# Patient Record
Sex: Female | Born: 1950
Health system: Southern US, Community
[De-identification: ages and names within clinical notes are randomized; demographics above are authoritative.]

## PROBLEM LIST (undated history)

## (undated) DIAGNOSIS — I499 Cardiac arrhythmia, unspecified: Secondary | ICD-10-CM

## (undated) DIAGNOSIS — N951 Menopausal and female climacteric states: Secondary | ICD-10-CM

## (undated) DIAGNOSIS — H3530 Unspecified macular degeneration: Secondary | ICD-10-CM

## (undated) DIAGNOSIS — I10 Essential (primary) hypertension: Secondary | ICD-10-CM

## (undated) DIAGNOSIS — N83201 Unspecified ovarian cyst, right side: Secondary | ICD-10-CM

## (undated) DIAGNOSIS — I639 Cerebral infarction, unspecified: Secondary | ICD-10-CM

## (undated) DIAGNOSIS — I4891 Unspecified atrial fibrillation: Secondary | ICD-10-CM

## (undated) DIAGNOSIS — N3281 Overactive bladder: Secondary | ICD-10-CM

## (undated) DIAGNOSIS — Z79899 Other long term (current) drug therapy: Secondary | ICD-10-CM

## (undated) DIAGNOSIS — E039 Hypothyroidism, unspecified: Secondary | ICD-10-CM

## (undated) DIAGNOSIS — G4730 Sleep apnea, unspecified: Secondary | ICD-10-CM

## (undated) HISTORY — DX: Unspecified atrial fibrillation: I48.91

## (undated) HISTORY — DX: Menopausal and female climacteric states: N95.1

## (undated) HISTORY — DX: Essential (primary) hypertension: I10

## (undated) HISTORY — PX: ABDOMINAL HYSTERECTOMY: SHX81

## (undated) HISTORY — PX: APPENDECTOMY: SHX54

## (undated) HISTORY — PX: CATARACT EXTRACTION W/ INTRAOCULAR LENS  IMPLANT, BILATERAL: SHX1307

## (undated) HISTORY — DX: Other long term (current) drug therapy: Z79.899

## (undated) HISTORY — PX: TONSILECTOMY, ADENOIDECTOMY, BILATERAL MYRINGOTOMY AND TUBES: SHX2538

## (undated) HISTORY — DX: Unspecified macular degeneration: H35.30

## (undated) HISTORY — DX: Unspecified ovarian cyst, right side: N83.201

---

## 2001-04-03 ENCOUNTER — Other Ambulatory Visit: Admission: RE | Admit: 2001-04-03 | Discharge: 2001-04-03 | Payer: Self-pay | Admitting: Obstetrics and Gynecology

## 2001-05-17 ENCOUNTER — Ambulatory Visit (HOSPITAL_COMMUNITY): Admission: RE | Admit: 2001-05-17 | Discharge: 2001-05-17 | Payer: Self-pay | Admitting: Obstetrics and Gynecology

## 2001-05-17 ENCOUNTER — Encounter: Payer: Self-pay | Admitting: Obstetrics and Gynecology

## 2003-01-23 ENCOUNTER — Ambulatory Visit (HOSPITAL_COMMUNITY): Admission: RE | Admit: 2003-01-23 | Discharge: 2003-01-23 | Payer: Self-pay | Admitting: Family Medicine

## 2003-01-23 ENCOUNTER — Encounter: Payer: Self-pay | Admitting: Family Medicine

## 2003-12-04 ENCOUNTER — Ambulatory Visit (HOSPITAL_COMMUNITY): Admission: RE | Admit: 2003-12-04 | Discharge: 2003-12-04 | Payer: Self-pay | Admitting: Internal Medicine

## 2003-12-04 HISTORY — PX: COLONOSCOPY: SHX174

## 2004-01-29 ENCOUNTER — Ambulatory Visit (HOSPITAL_COMMUNITY): Admission: RE | Admit: 2004-01-29 | Discharge: 2004-01-29 | Payer: Self-pay | Admitting: Family Medicine

## 2005-04-06 ENCOUNTER — Ambulatory Visit (HOSPITAL_COMMUNITY): Admission: RE | Admit: 2005-04-06 | Discharge: 2005-04-06 | Payer: Self-pay | Admitting: Family Medicine

## 2006-04-11 ENCOUNTER — Ambulatory Visit (HOSPITAL_COMMUNITY): Admission: RE | Admit: 2006-04-11 | Discharge: 2006-04-11 | Payer: Self-pay | Admitting: Family Medicine

## 2006-04-21 ENCOUNTER — Encounter: Admission: RE | Admit: 2006-04-21 | Discharge: 2006-04-21 | Payer: Self-pay | Admitting: Family Medicine

## 2006-04-21 IMAGING — MG MM DIAGNOSTIC LTD LEFT
2 series · 2 of 2 positions shown · non-contrast
Comparison: none

[REDACTED] LEFT
CC and MLO view(s) were taken of the left breast.

DIGITAL LIMITED LEFT DIAGNOSTIC MAMMOGRAM:
CLINICAL DATA: Abnormal screening mammogram.
Comparison study is dated [DATE].
Additional views reveal no persistent mass or distortion within the inner left breast.    The dense
fibroglandular parenchymal pattern is stable.

[L CC]
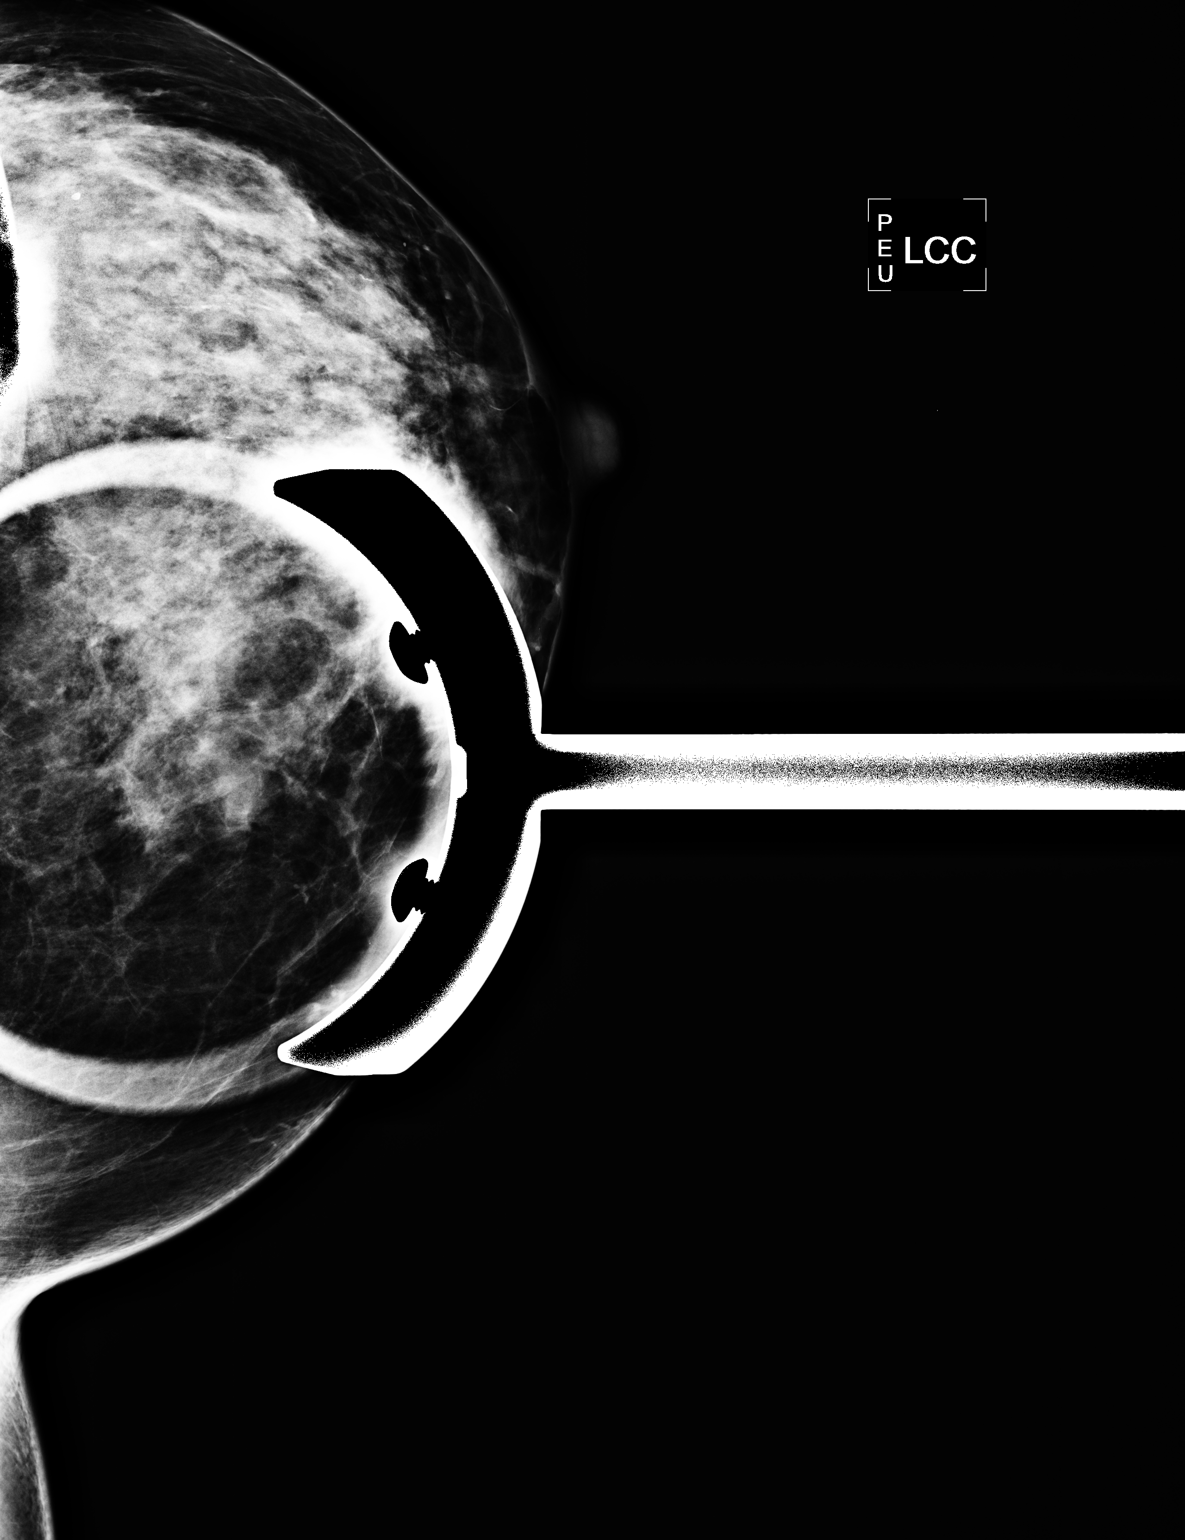

[L MLO]
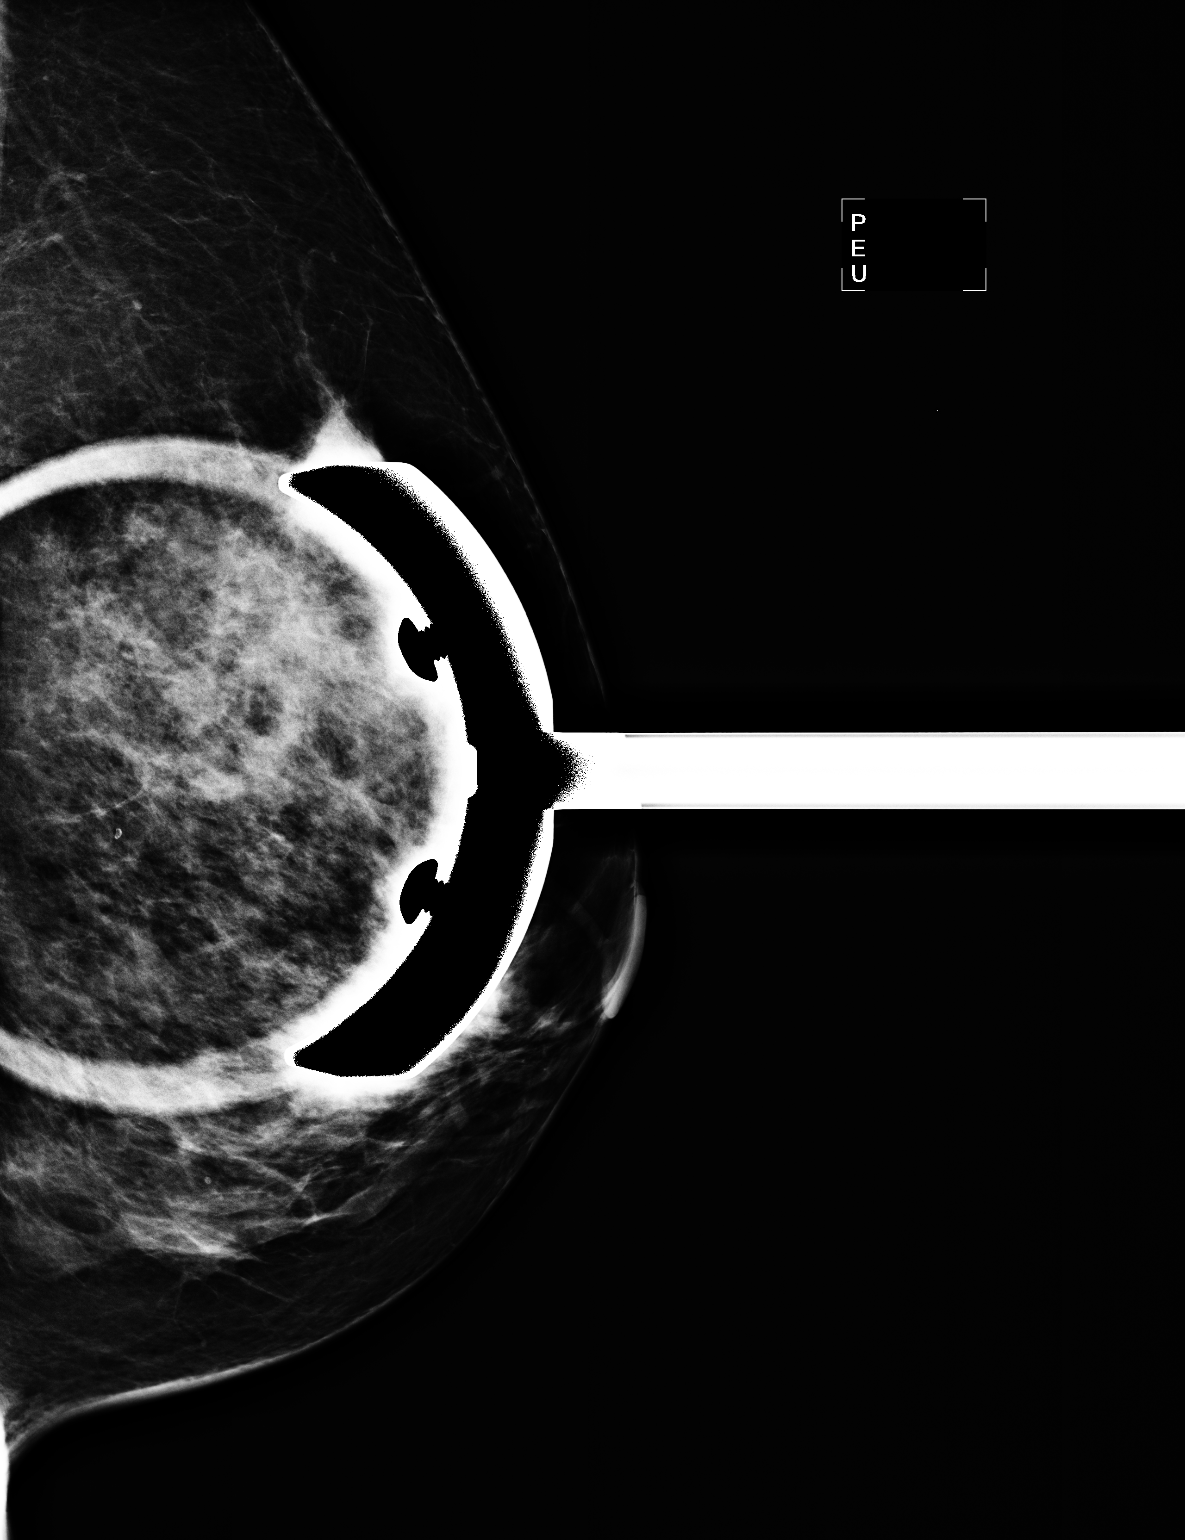

[2 of 2 positions shown; findings below may reference images not displayed]

IMPRESSION: Stable mammogram with no specific radiographic evidence of malignancy on the left.  Screening 
mammogram in one year is recommended.

ASSESSMENT: Benign - BI-RADS 2

Screening mammogram of both breasts in 1 year.
, THIS PROCEDURE WAS A DIGITAL MAMMOGRAM.

## 2007-04-24 ENCOUNTER — Ambulatory Visit (HOSPITAL_COMMUNITY): Admission: RE | Admit: 2007-04-24 | Discharge: 2007-04-24 | Payer: Self-pay | Admitting: Obstetrics and Gynecology

## 2007-07-27 ENCOUNTER — Ambulatory Visit: Payer: Self-pay | Admitting: Cardiovascular Disease

## 2007-08-08 ENCOUNTER — Encounter: Payer: Self-pay | Admitting: Cardiovascular Disease

## 2007-08-08 ENCOUNTER — Ambulatory Visit (HOSPITAL_COMMUNITY): Admission: RE | Admit: 2007-08-08 | Discharge: 2007-08-08 | Payer: Self-pay | Admitting: Cardiovascular Disease

## 2007-08-08 ENCOUNTER — Ambulatory Visit: Payer: Self-pay | Admitting: Cardiovascular Disease

## 2007-08-08 ENCOUNTER — Ambulatory Visit: Payer: Self-pay | Admitting: Cardiology

## 2008-05-14 ENCOUNTER — Ambulatory Visit (HOSPITAL_COMMUNITY): Admission: RE | Admit: 2008-05-14 | Discharge: 2008-05-14 | Payer: Self-pay | Admitting: Family Medicine

## 2008-05-14 IMAGING — MG MM DIGITAL SCREENING BILAT W/ CAD
5 series · 5 of 5 positions shown · non-contrast
Comparison: Prior studies.

DG SCREEN MAMMOGRAM BILATERAL
Bilateral CC and MLO view(s) were taken.
Prior study comparison: [DATE], bilateral screening mammogram.

DIGITAL SCREENING MAMMOGRAM WITH CAD:

[L CC]
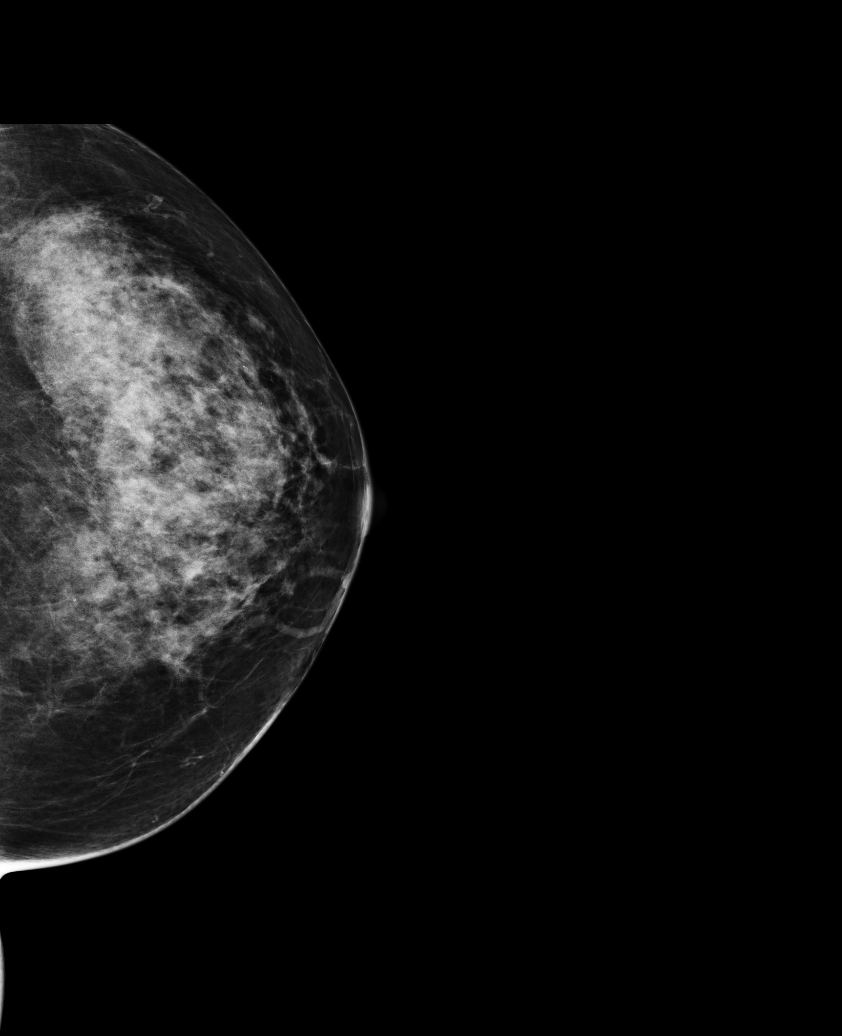

[L MLO (1 of 2)]
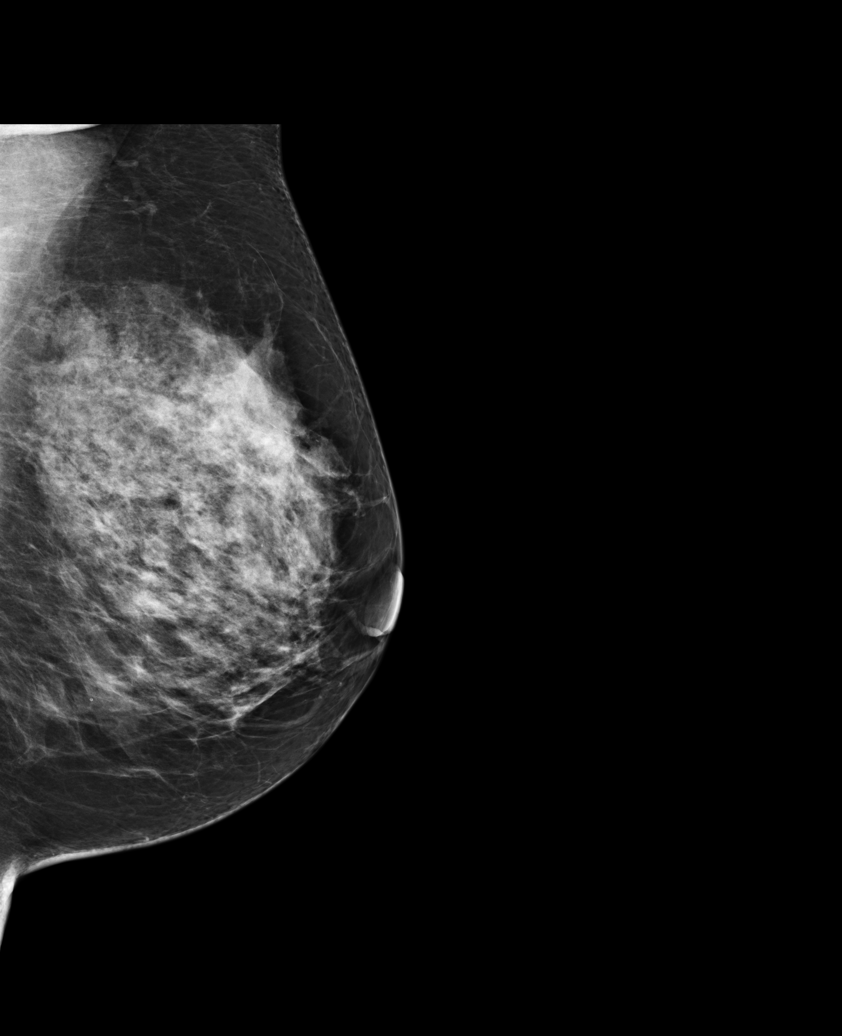

[R CC]
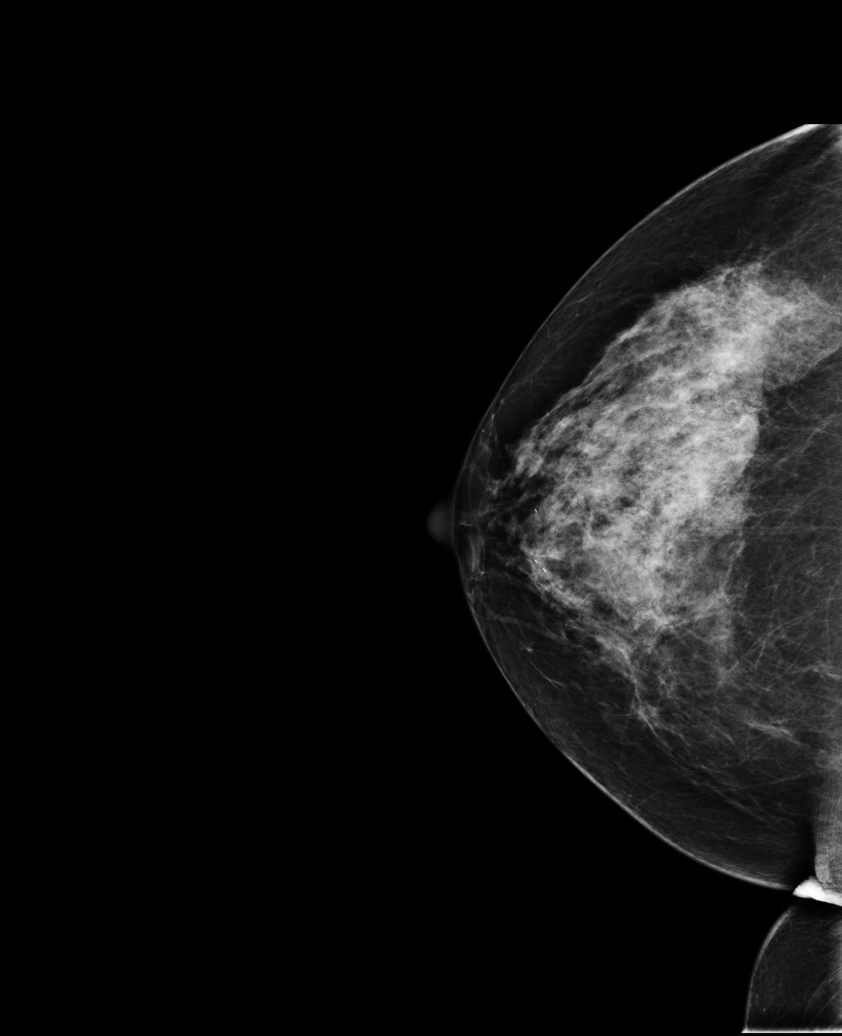

[R MLO]
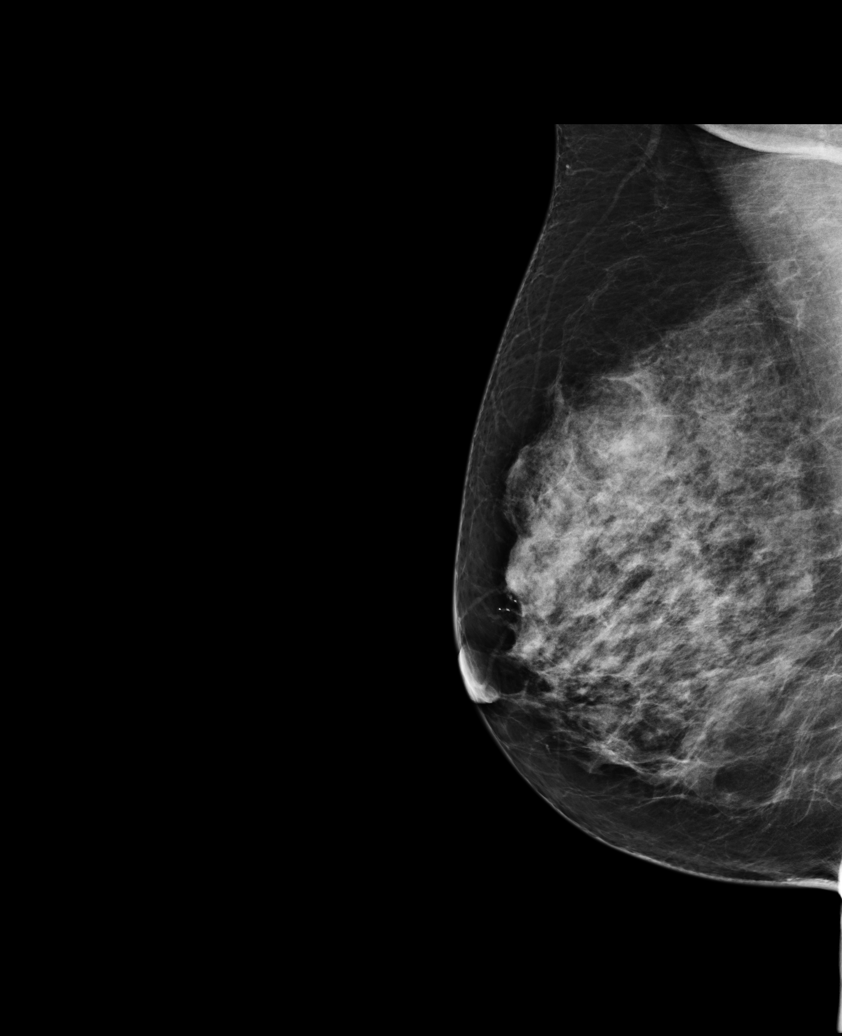

[L MLO (2 of 2)]
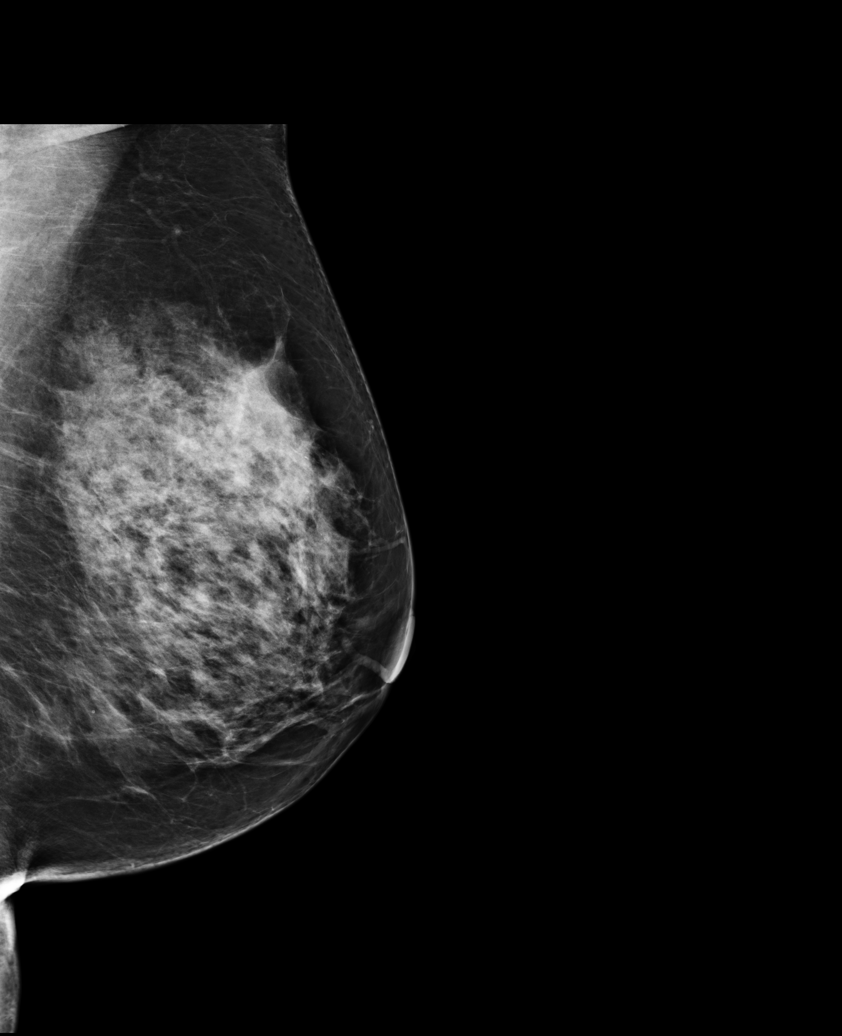

[5 of 5 positions shown; findings below may reference images not displayed]

The breast tissue is heterogeneously dense.  There is no dominant mass, architectural distortion or
calcification to suggest malignancy.
IMPRESSION: No mammographic evidence of malignancy.  Suggest yearly screening mammography.

ASSESSMENT: Negative - BI-RADS 1

Screening mammogram in 1 year.
THIS WAS ANALAYZED BY COMPUTER AIDED DETECTION. , THIS PROCEDURE WAS A DIGITAL MAMMOGRAM.

## 2008-09-23 DIAGNOSIS — I480 Paroxysmal atrial fibrillation: Secondary | ICD-10-CM

## 2008-09-23 DIAGNOSIS — I10 Essential (primary) hypertension: Secondary | ICD-10-CM

## 2009-06-09 ENCOUNTER — Ambulatory Visit (HOSPITAL_COMMUNITY): Admission: RE | Admit: 2009-06-09 | Discharge: 2009-06-09 | Payer: Self-pay | Admitting: Family Medicine

## 2009-06-09 IMAGING — MG MM DIGITAL SCREENING
5 series · 5 of 5 positions shown · non-contrast
Comparison: none

DG SCREEN MAMMOGRAM BILATERAL
Bilateral CC and MLO view(s) were taken.
Technologist: [REDACTED]

DIGITAL SCREENING MAMMOGRAM WITH CAD:
The breast tissue is extremely dense.  No masses or malignant type calcifications are identified.  
Compared with prior studies.
Images were processed with CAD.

[L CC (1 of 2)]
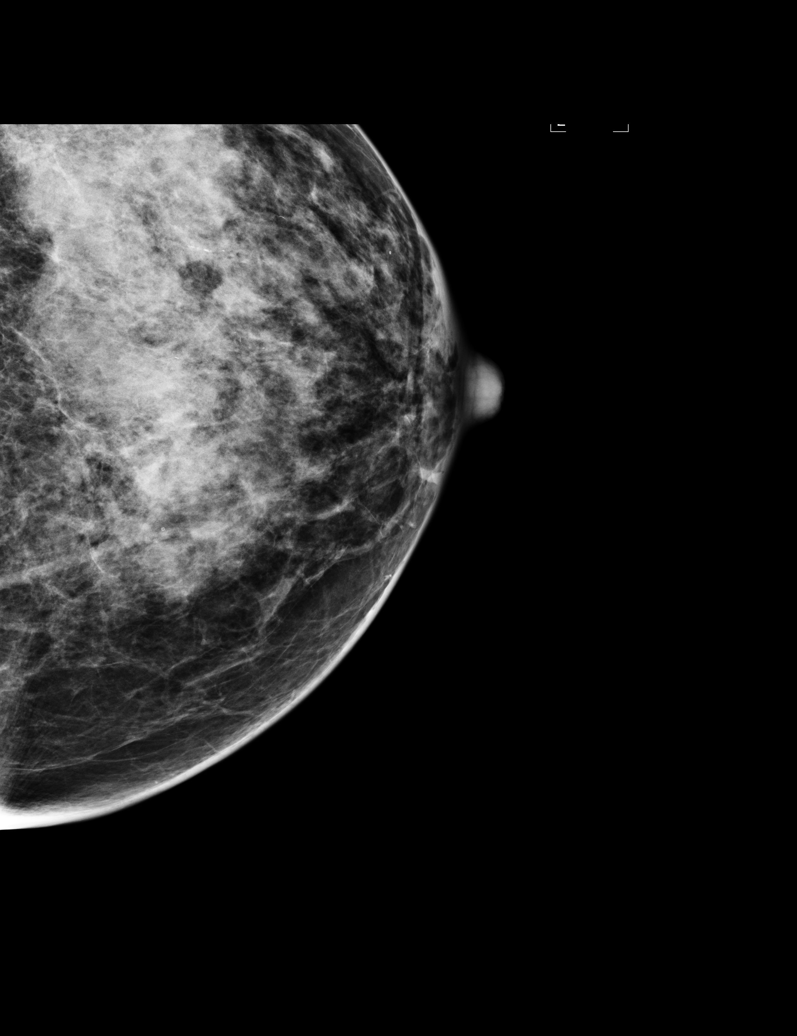

[L MLO]
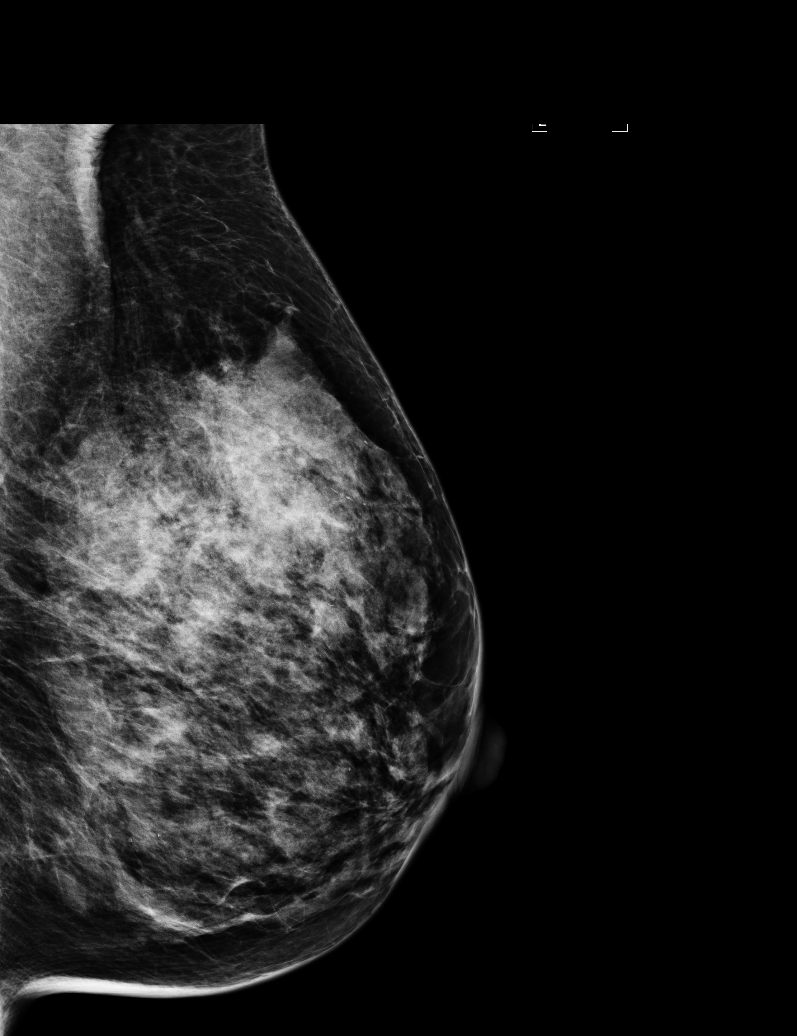

[R CC]
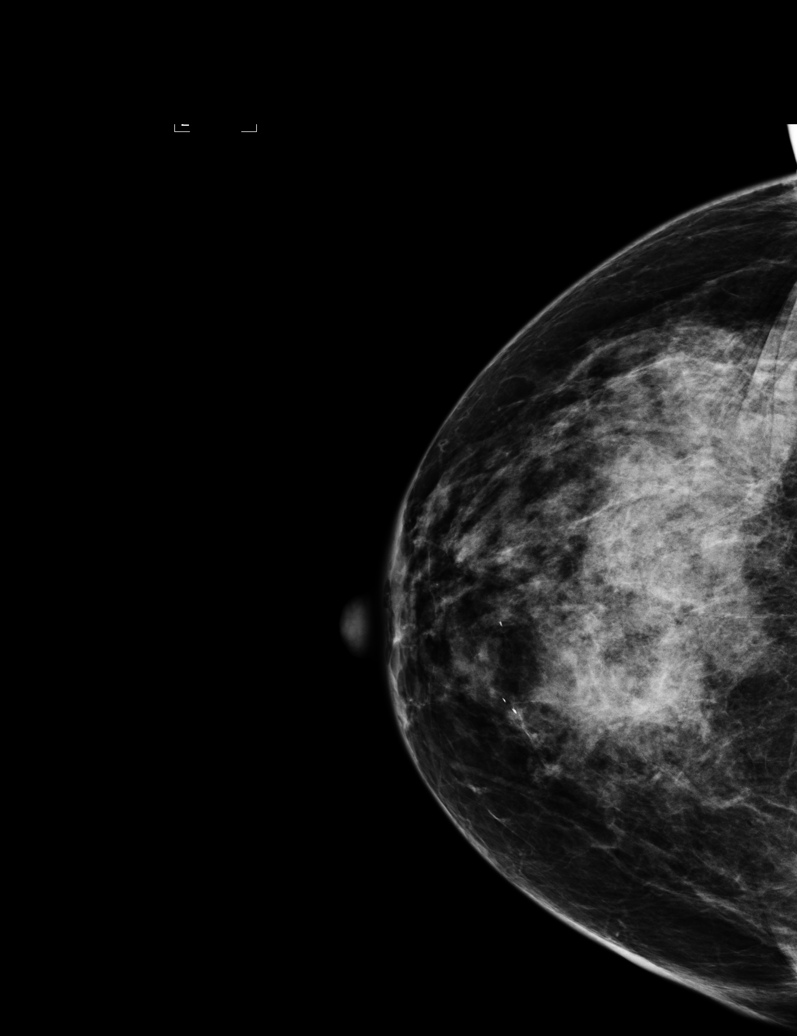

[R MLO]
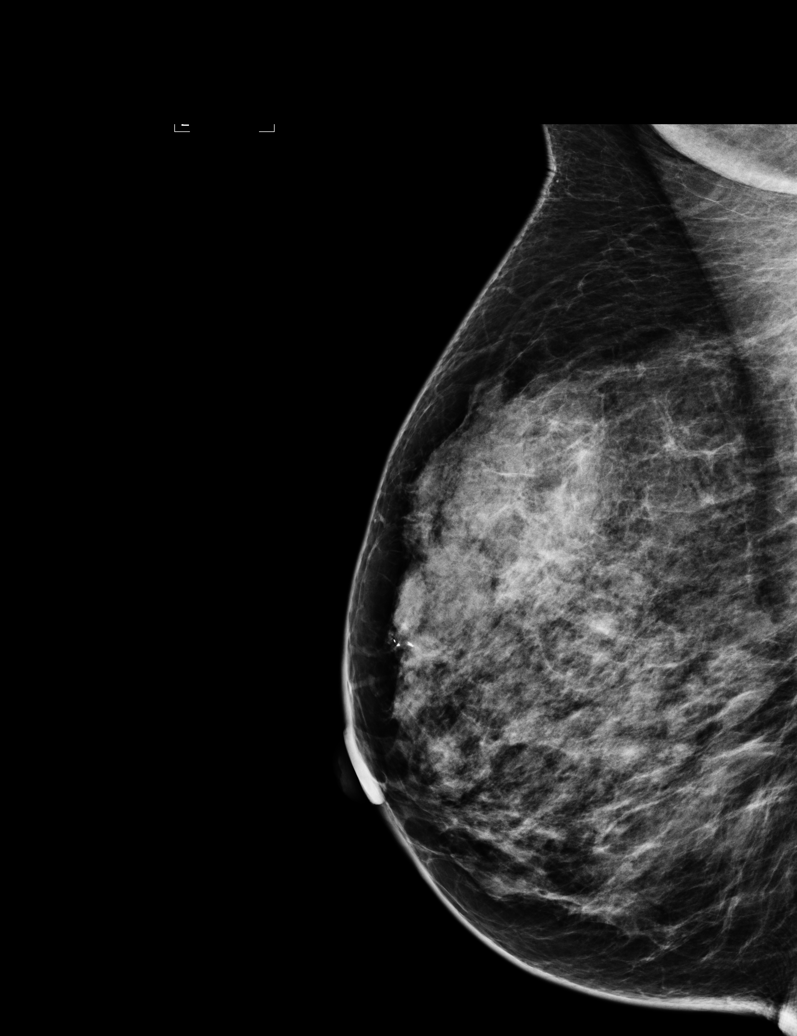

[L CC (2 of 2)]
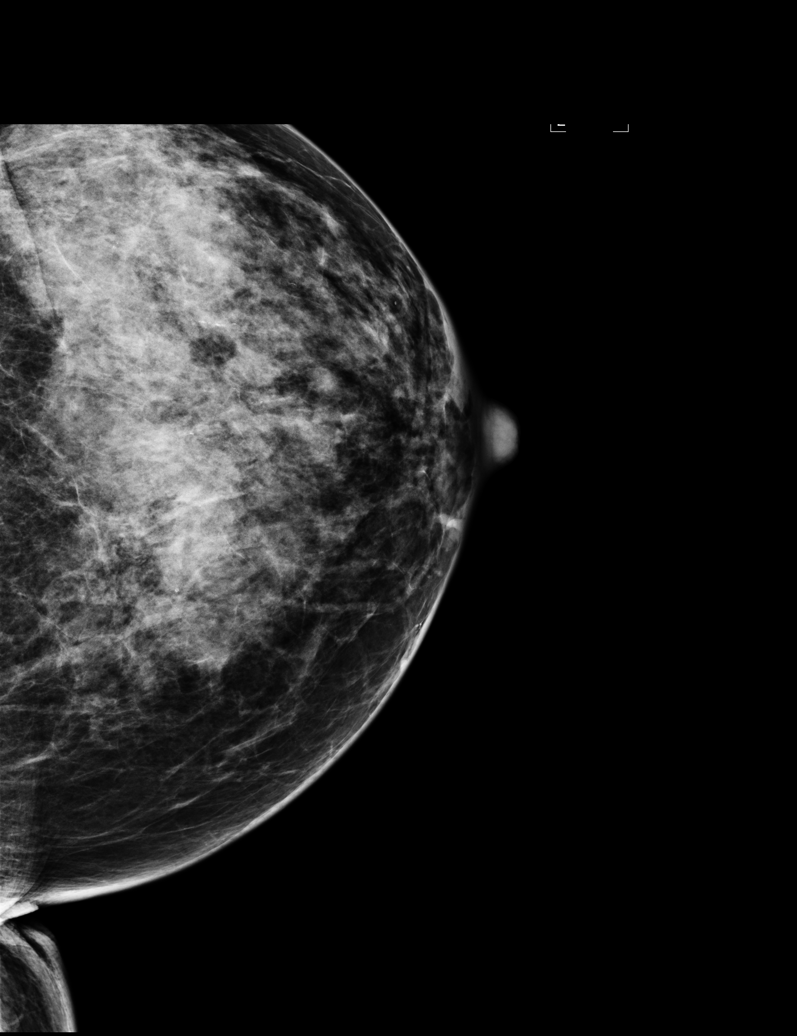

[5 of 5 positions shown; findings below may reference images not displayed]

IMPRESSION: No specific mammographic evidence of malignancy.  Next screening mammogram is recommended in one 
year.

A result letter of this screening mammogram will be mailed directly to the patient.

ASSESSMENT: Negative - BI-RADS 1

Screening mammogram in 1 year.
,

## 2010-05-02 ENCOUNTER — Encounter: Payer: Self-pay | Admitting: Family Medicine

## 2010-05-03 ENCOUNTER — Encounter: Payer: Self-pay | Admitting: Family Medicine

## 2010-06-29 ENCOUNTER — Other Ambulatory Visit (HOSPITAL_COMMUNITY): Payer: Self-pay | Admitting: Family Medicine

## 2010-06-29 DIAGNOSIS — Z139 Encounter for screening, unspecified: Secondary | ICD-10-CM

## 2010-07-06 ENCOUNTER — Emergency Department (HOSPITAL_COMMUNITY): Payer: 59

## 2010-07-06 ENCOUNTER — Emergency Department (HOSPITAL_COMMUNITY)
Admission: EM | Admit: 2010-07-06 | Discharge: 2010-07-06 | Disposition: A | Discharge status: Home / Self Care | Payer: 59 | Attending: Emergency Medicine | Admitting: Emergency Medicine

## 2010-07-06 DIAGNOSIS — I4891 Unspecified atrial fibrillation: Secondary | ICD-10-CM | POA: Insufficient documentation

## 2010-07-06 DIAGNOSIS — R9431 Abnormal electrocardiogram [ECG] [EKG]: Secondary | ICD-10-CM | POA: Insufficient documentation

## 2010-07-06 DIAGNOSIS — R000 Tachycardia, unspecified: Secondary | ICD-10-CM | POA: Insufficient documentation

## 2010-07-06 DIAGNOSIS — D72829 Elevated white blood cell count, unspecified: Secondary | ICD-10-CM | POA: Insufficient documentation

## 2010-07-06 LAB — POCT CARDIAC MARKERS
CKMB, poc: <1 ng/mL — ABNORMAL LOW (ref 1.0–8.0)
Troponin i, poc: <0.05 ng/mL (ref 0.00–0.09)

## 2010-07-06 LAB — CBC
HCT: 43.8 % (ref 36.0–46.0)
Hemoglobin: 15.2 g/dL — ABNORMAL HIGH (ref 12.0–15.0)
MCH: 30.6 pg (ref 26.0–34.0)
MCHC: 34.7 g/dL (ref 30.0–36.0)
MCV: 88.3 fL (ref 78.0–100.0)
Platelets: 268 K/uL (ref 150–400)
RBC: 4.96 MIL/uL (ref 3.87–5.11)
RDW: 13 % (ref 11.5–15.5)
WBC: 13 K/uL — ABNORMAL HIGH (ref 4.0–10.5)

## 2010-07-06 LAB — BASIC METABOLIC PANEL
BUN: 9 mg/dL (ref 6–23)
CO2: 25 mEq/L (ref 19–32)
Chloride: 103 mEq/L (ref 96–112)
Creatinine, Ser: 0.64 mg/dL (ref 0.4–1.2)
GFR calc Af Amer: >60 mL/min (ref >60)

## 2010-07-06 IMAGING — CR DG CHEST 1V PORT
1 series · 1 of 1 positions shown · non-contrast
Comparison: None.

CLINICAL DATA: Arrhythmia/cough

PORTABLE CHEST - 1 VIEW

[view not recorded]
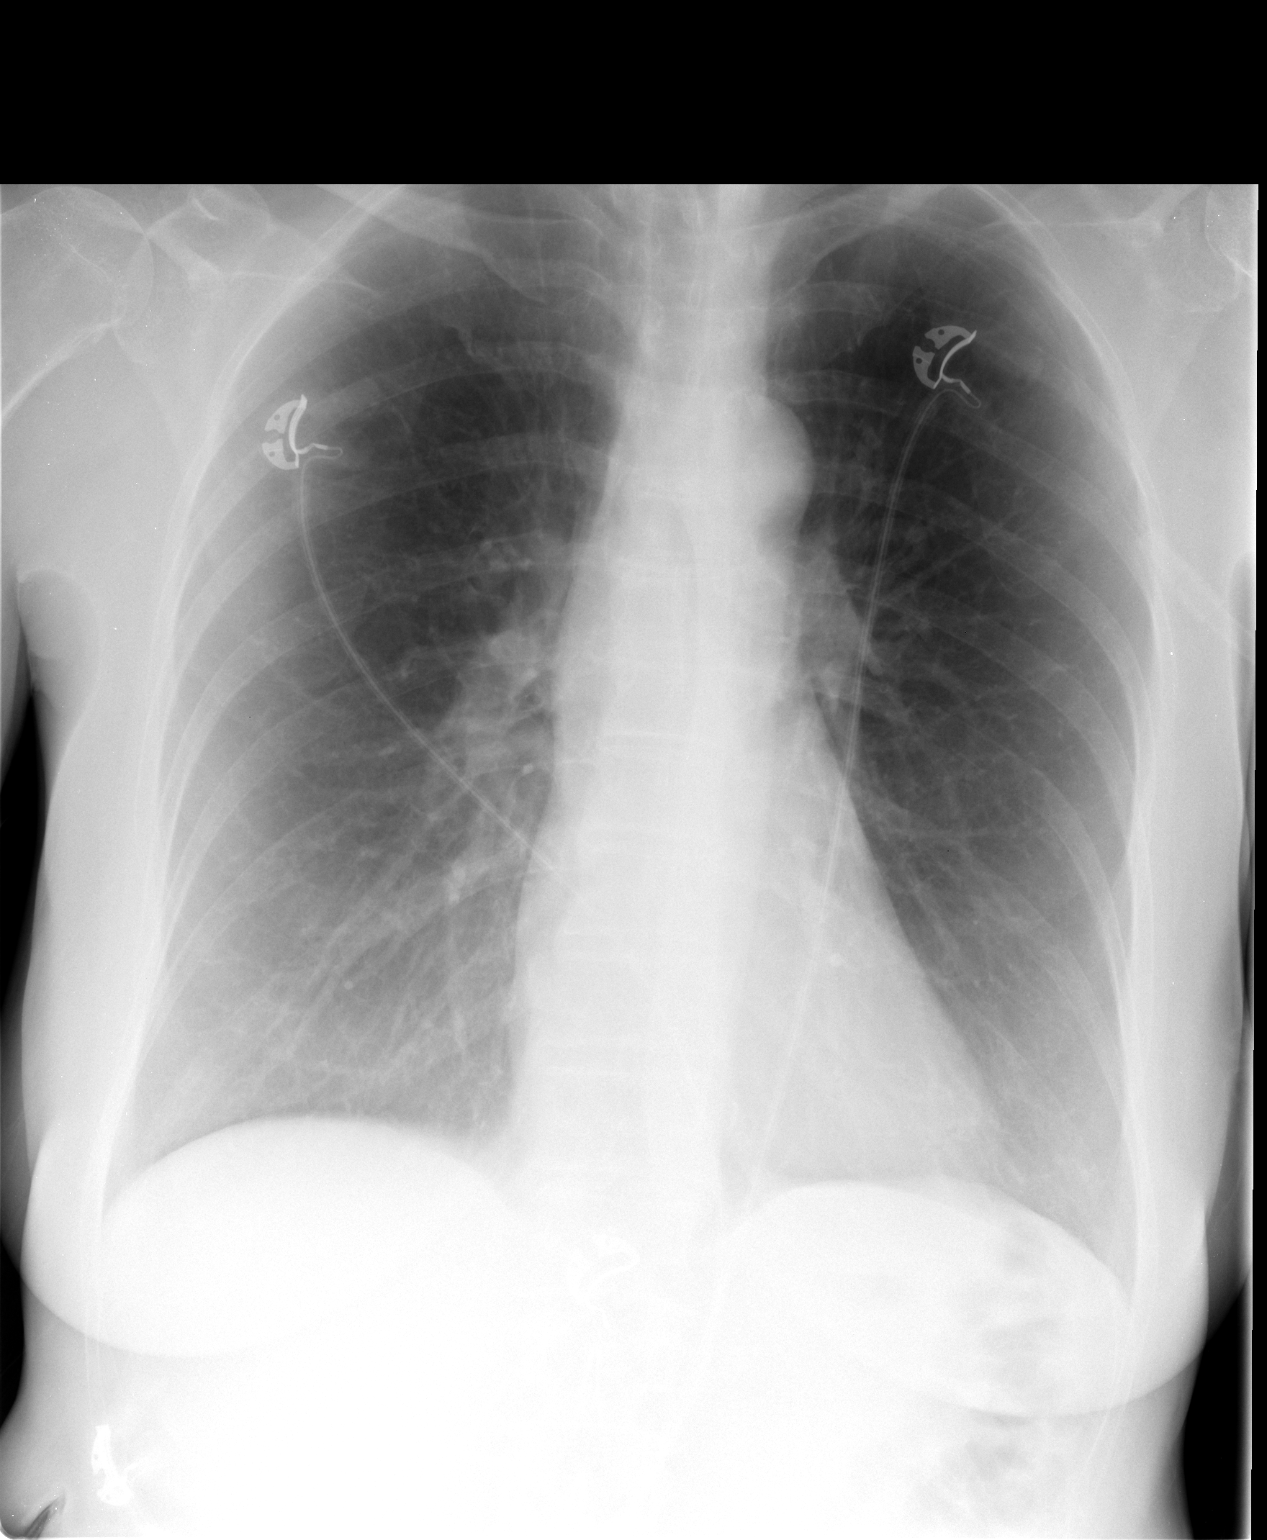

[1 of 1 positions shown; findings below may reference images not displayed]

FINDINGS: Heart and mediastinal contours normal.  Lungs clear
although  hyperaerated, suggesting COPD.  No lung consolidation or
pleural fluid in one-view.
IMPRESSION: Probable COPD. No active disease in one-view.

## 2010-07-08 ENCOUNTER — Ambulatory Visit (HOSPITAL_COMMUNITY)
Admit: 2010-07-08 | Discharge: 2010-07-08 | Disposition: A | Discharge status: Home / Self Care | Payer: 59 | Source: Ambulatory Visit | Attending: Adult Health | Admitting: Adult Health

## 2010-07-08 ENCOUNTER — Other Ambulatory Visit: Payer: Self-pay | Admitting: Cardiology

## 2010-07-08 DIAGNOSIS — I4891 Unspecified atrial fibrillation: Secondary | ICD-10-CM

## 2010-07-08 DIAGNOSIS — I10 Essential (primary) hypertension: Secondary | ICD-10-CM | POA: Insufficient documentation

## 2010-07-13 ENCOUNTER — Ambulatory Visit (HOSPITAL_COMMUNITY)
Admission: RE | Admit: 2010-07-13 | Discharge: 2010-07-13 | Disposition: A | Discharge status: Home / Self Care | Payer: 59 | Source: Ambulatory Visit | Attending: Family Medicine | Admitting: Family Medicine

## 2010-07-13 DIAGNOSIS — Z1231 Encounter for screening mammogram for malignant neoplasm of breast: Secondary | ICD-10-CM | POA: Insufficient documentation

## 2010-07-13 DIAGNOSIS — Z139 Encounter for screening, unspecified: Secondary | ICD-10-CM

## 2010-07-14 NOTE — Consult Note (Signed)
Renee Benitez, Renee Benitez               ACCOUNT NO.:  [id number]  MEDICAL RECORD NO.:  [id number]           PATIENT TYPE:  E  LOCATION:  [facility]                          FACILITY:  [facility]  PHYSICIAN:  [name], MD DATE OF BIRTH:  27-Jul-1950  DATE OF CONSULTATION:  07/06/2010 DATE OF DISCHARGE:  07/06/2010                                CONSULTATION   REASON FOR CONSULTATION:  AFib, RVR.  HISTORY OF PRESENT ILLNESS:  This is a 60 year old Caucasian female with known history of PSVT and had been seen by Dr. Charlton Haws in 2009, with an echocardiogram and a stress test at that time which were found to be normal.  Four nights ago, the patient had an episode of rapid heartbeat lasting 1 hour after having a coughing episode.  She took 4 baby aspirin, laid down, and it went away.  The following evening, again she had another coughing episode, she took 4 baby aspirin and it went away, each episode lasting approximately 1 hour.  The patient did not have any further episodes over the last 2 days and then has had an episode this morning while she was at work.  She had rapid heart rate, she had a coworker check her.  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 EKG was completed there and showed rapid heart rate of 160 beats per minute.  She was sent over to see Dr. Sudie Bailey, her primary care physician at which time, she was also trying to see him for sinusitis. Her heart rate remained elevated and she was sent directly to [facility].  In the ER, the patient was given 15 mg IV bolus of Cardizem and started on a Cardizem drip at 10 mg an hour.  She subsequently converted to normal sinus rhythm with a rate in the 70s and continues on an IV drip at this time at 10 mg.  We were asked to see her at the request of ER physician for __________ and need for discharge or admission to [facility].  She is normally followed by Dr. Sudie Bailey.  Of note, the patient has lost 30 pounds over 1  year.  She had been on metoprolol 50 mg daily per Dr. Eden Emms for frequent palpitations, however, she decreased it to 25 mg daily as she states that her blood pressure was too low after losing weight.  She is being followed by Dr. Sudie Bailey and he was aware of her medication dose change.  He has also been following her for her thyroid.  She states that they have been mildly elevated in his office, but she states that he had not put her on any medications as it was not significantly elevated and he was going to continue to follow.  Also noted that secondary to her frequent sinusitis and sinus congestion, she had been on some pseudoephedrine 30 mg which she was taking p.r.n., although not excessively.  REVIEW OF SYSTEMS:  Positive for sinus pressure and allergies, rapid heart rate, some mild pressure with the rapid heart rate.  She denies chest pain, shortness of breath, dizziness, nausea or vomiting.  Other systems are reviewed and are found to be negative.  Code status is a full code.  PAST MEDICAL HISTORY: 1. PSVT in 2009. 2. Hypertension. 3. She also had a stress test and echocardiogram completed at that     time both of which were negative.  SOCIAL HISTORY:  She lives in [place] with her husband.  She is a Engineer, civil (consulting) at [employer].  She is married.  She does not smoke.  She has rare alcohol, occasional glass of wine, and negative for drugs.  FAMILY HISTORY:  Mother deceased when the patient was a child from suicide.  Her father had CAD and deceased from an MI.  She has one sister who has had rapid heart rates in the past and has had an AV ablation.  CURRENT MEDICATIONS PRIOR TO ADMISSION: 1. Toprol-XL 25 mg daily at bedtime. 2. Hormone patch. 3. Aspirin 81 mg daily. 4. Multivitamin. 5. Recent over the counter use of pseudoephedrine.  ALLERGIES:  No known drug allergies.  CURRENT LABS:  Sodium 138, potassium 5.0, chloride 103, CO2 of 25, BUN 9, creatinine 0.64,  glucose 107.  Hemoglobin 15.2, hematocrit 43.8, white blood cells 13.0, platelets 268.  Point-of-care is negative for 0.05.  Chest x-ray revealing COPD with no active disease.  EKG revealing normal sinus rhythm with a rate of 69 beats per minute.  Of note, initial EKG on admission revealed an atrial fibrillation with RVR, rate of 159 beats per minute.  PHYSICAL EXAMINATION:  VITAL SIGNS:  Blood pressure 122/93, pulse 98, respirations 20, temperature 98.3, O2 sat 100% on 2 L. GENERAL:  She is awake, alert, oriented, no acute distress. HEENT:  Head is normocephalic and atraumatic.  Eyes, PERRLA. NECK:  Supple.  No JVD.  No thyromegaly.  No carotid bruits are appreciated. CARDIOVASCULAR:  Regular rate and rhythm without murmurs, rubs, or gallops.  Pulses are 2+ and equal without bruits.  LUNGS:  Clear to auscultation without wheezes, rales, or rhonchi. ABDOMEN:  Soft, nontender with 2+ bowel sounds. EXTREMITIES:  Without clubbing, cyanosis, or edema. MUSCULOSKELETAL:  No joint deformity or effusions. NEUROLOGIC:  Cranial nerves II through XII are grossly intact.  IMPRESSION:  Paroxysmal atrial fibrillation, now converted to normal sinus rhythm on Cardizem drip at 10 mg an hour after a bolus.  She has been having symptoms on and off since Thursday approximately 4 days ago, but has had a history of palpitations for years which have been quiescent for 2 years on use of metoprolol.  The patient has no associated chest pain, dyspnea.  She is currently stable.  The patient has been seen and examined by myself and Dr. Nona Dell.  There is a question whether recent upper respiratory infection with cough and use of pseudoephedrine elicited this.  The patient is currently stable on the Cardizem drip at 10 mg an hour.  Our plan will be to place her on Cardizem CD 120 mg daily.  She will discontinue her IV drip and allow her walk around.  If she has recurrent atrial fibrillation with RVR,  she will need to be admitted, otherwise she will return home at which time she will have a followup appointment to have an echocardiogram on July 08, 2010, at 9:30 a.m. and then see Dr. Diona Browner on August 05, 2010, at 3 p.m. in our office.  This has been discussed with the patient and her husband at bedside and they are agreeable to follow this recommendations.  The patient will also have a  thyroid panel completed as it had been mildly abnormal in Dr. Michelle Nasuti office for reevaluation of this.  The patient will be continued on aspirin 81 mg daily.  For now, her CHADS score is 1 for hypertension.     Bettey Mare. Lyman Bishop, NP   ______________________________ [name], MD    KML/MEDQ  D:  07/06/2010  T:  07/07/2010  Job:  [id number]  cc:   Mila Homer. Sudie Bailey, M.D. Fax: [phone]  Electronically Signed by Joni Reining NP on 07/13/2010 08:01:10 AM Electronically Signed by Nona Dell MD on 07/14/2010 05:15:29 PM

## 2010-08-04 ENCOUNTER — Encounter: Payer: Self-pay | Admitting: Cardiology

## 2010-08-05 ENCOUNTER — Ambulatory Visit (INDEPENDENT_AMBULATORY_CARE_PROVIDER_SITE_OTHER): Payer: 59 | Admitting: Cardiology

## 2010-08-05 ENCOUNTER — Encounter: Payer: Self-pay | Admitting: Cardiology

## 2010-08-05 VITALS — BP 122/62 | HR 76 | Ht 66.0 in | Wt 141.0 lb

## 2010-08-05 DIAGNOSIS — I4891 Unspecified atrial fibrillation: Secondary | ICD-10-CM

## 2010-08-05 DIAGNOSIS — I10 Essential (primary) hypertension: Secondary | ICD-10-CM

## 2010-08-05 MED ORDER — DILTIAZEM HCL 30 MG PO TABS: 30.0000 mg | ORAL_TABLET | Freq: Four times a day (QID) | ORAL | Status: DC | PRN

## 2010-08-05 MED ORDER — DILTIAZEM HCL ER COATED BEADS 120 MG PO CP24: 120.0000 mg | ORAL_CAPSULE | Freq: Every day | ORAL | Status: DC

## 2010-08-05 NOTE — Assessment & Plan Note (Signed)
Paroxysmal, symptomatically controlled at this time on Cardizem CD 120 mg daily. Prescription to be given for Cardizem 30 mg (short-acting) for breakthrough symptoms. She will continue on aspirin with a CHADS2 score of one. Continue observation for now. We can always pursue antiarrhythmic therapy as the next step.

## 2010-08-05 NOTE — Patient Instructions (Signed)
Your physician has recommended you make the following change in your medication: Start taking Diltiazem 30mg  every 6 hours as needed for recurring palpitations.  Your physician recommends that you schedule a follow-up appointment in: 6 months

## 2010-08-05 NOTE — Assessment & Plan Note (Signed)
Blood pressure well controlled, continue follow up with primary care.

## 2010-08-05 NOTE — Progress Notes (Signed)
Clinical Summary Renee Benitez is a 60 y.o.female presents for followup, status post recent ER consultation in late March with paroxysmal atrial fibrillation and rapid ventricular response. She spontaneously converted to sinus rhythm with Cardizem intravenously, and was sent home with aspirin and long-acting oral Cardizem. Followup echocardiogram as noted below.  She states she has done well since prior evaluation. Only rare, brief palpitations, none prolonged or particularly symptomatic. She indicates tolerating Cardizem CD and aspirin. She states that she spoke with her sister who also had paroxysmal atrial fibrillation, failed antiarrhythmic therapy, and ultimately underwent ablation.  For now she was most comfortable with observation on the present medical regimen. Depending on frequency and recurrence of atrial fibrillation, we can discuss other options, likely to consider antiarrhythmic therapy as a next step.  She states she has completely cut out caffeine. Also reportedly over the previous upper respiratory tract infection.   No Known Allergies  Current outpatient prescriptions:aspirin 81 MG tablet, Take 81 mg by mouth daily.  , Disp: , Rfl: ;  calcium carbonate (OS-CAL) 600 MG TABS, Take 600 mg by mouth daily.  , Disp: , Rfl: ;  diltiazem (CARDIZEM CD) 120 MG 24 hr capsule, Take 1 capsule (120 mg total) by mouth daily., Disp: 90 capsule, Rfl: 1;  estradiol (VIVELLE-DOT) 0.1 MG/24HR, Place 1 patch onto the skin 2 (two) times a week.  , Disp: , Rfl:  Multiple Vitamins-Minerals (MULTIVITAMIN WITH MINERALS) tablet, Take 1 tablet by mouth daily.  , Disp: , Rfl: ;  DISCONTD: diltiazem (CARDIZEM CD) 120 MG 24 hr capsule, Take 120 mg by mouth daily. , Disp: , Rfl: ;  DISCONTD: diltiazem (CARDIZEM CD) 120 MG 24 hr capsule, Take 1 capsule (120 mg total) by mouth daily., Disp: 90 capsule, Rfl: 1 diltiazem (CARDIZEM) 30 MG tablet, Take 1 tablet (30 mg total) by mouth every 6 (six) hours as needed. Take 1 to  2 tablets every 6 hrs. as needed for palpitations, Disp: 30 tablet, Rfl: 2;  DISCONTD: diltiazem (CARDIZEM) 30 MG tablet, Take 1 tablet (30 mg total) by mouth every 6 (six) hours as needed. Take 1 to 2 tablets every 6 hrs. as needed for palpitations, Disp: 120 tablet, Rfl: 11  Past Medical History  Diagnosis Date  . PSVT (paroxysmal supraventricular tachycardia)     Paroxysmal, CHADS2 score 1  . Atrial fibrillation   . Hypertension   . Menopausal syndrome     Social History Renee Benitez reports that she has never smoked. She has never used smokeless tobacco. Renee Benitez reports that she drinks alcohol.  Review of Systems Otherwise reviewed and negative.  Physical Examination Filed Vitals:   08/05/10 1453  BP: 122/62  Pulse: 76  Normally nourished appearing woman in no acute distress. HEENT: Conjunctiva and lids normal, oropharynx clear. Neck: Supple, no elevated JVP or bruits. Lungs: Clear to auscultation, nontender. Cardiac: Regular rate and rhythm, no mid systolic click or S3. Abdomen: Soft, nontender, bowel sounds present. Skin: Warm and dry.   Studies Echocardiogram 07/08/2010: Normal LV chamber size and wall thickness, LVEF 55-60%. Flat coaptation of the mitral valve with some systolic bowing but no definite prolapse, trivial mitral regurgitation, trivial tricuspid regurgitation, no peripheral effusion.  Problem List and Plan

## 2010-08-25 NOTE — Assessment & Plan Note (Signed)
[facility]                       [place] [facility] OFFICE NOTE   SAYDI, KOBEL                      MRN:          [id number]  DATE:07/27/2007                            DOB:          Jun 10, 1950    The patient is referred for palpitations, question SVT, history of  hypertension, question need to start estrogen therapy.  The patient has  been having hot flashes for about 2 years.  She appears to be having  signs of menopause before being started on estrogen therapy.  Cyril Mourning was concerned about her palpitations.  The patient's  palpitation's sound benign.  She has had them for 3 or 4 years.  When  she gets them, that can be very fast but also irregular.  They can last  1 to 3 hours.  She has never gone to the ER for them.  She has tried  cold water and Valsalva without shortening them, and she has never had  medication to take to shorten them.   They are not associated with dyspnea, diaphoresis, or chest pain.   Her last episode was in March at [facility], this year.  However, she  gets them very infrequently, so infrequently it would not be worth  trying to give her a monitor.   She has never had a previous cardiac workup.  When she gets her  palpitations, she is frequently about to go to bed at night.  She is  functional.  She does not get dizziness, and she has never passed out.  As far I can tell, she has not had any significant coronary artery  disease.  She is never had a stress test or echo.  Her review of systems  is otherwise negative.  Her risk factors include hypertension.  She has  been on a low-dose of a diuretic, since December 2007.   She had palpitations prior to being on a diuretic, but I explained to  her that electrolyte abnormalities may make things worse.   PAST MEDICAL HISTORY:  Otherwise remarkable for irregular heartbeat,  hypertension, vasomotor symptoms with probable menopause.   The patient has a  history of hysterectomy and appendectomy.   ALLERGIES:  SHE HAS NO KNOWN ALLERGIES.   CURRENT MEDICATIONS:  Her current medications include:  1. Hydrochlorothiazide 12-1/2 a day.  2. Multivitamins.  3. Calcium.  4. She may start estrogen.   SOCIAL HISTORY:  She is happily married.  She is a Engineer, civil (consulting) at [employer].  She likes to read, and she and her husband motorcycle as part  of the [employer].  She has 2 grown children.  She seems well  adjusted and happy.  She does not smoke or drink.  She does not smoke  and drinks only on occasion.   Family History: Father died of bladder cancer.  Mother committed  suicide.  She does have a sister who required an ablation, who is 8 years older  than her.   EXAMINATION:  GENERAL:  Her exam is remarkable for a middle-aged white  female in no distress.  VITAL SIGNS:  She has  slightly premature gray hair.  Weight is 169,  blood pressure is 140/70, pulse 79 regular, afebrile, respiratory 14.  HEENT:  Unremarkable.  Carotids are without bruit.  No lymphadenopathy  or thyromegaly, JVP elevation.  LUNGS:  Clear with good diaphragmatic motion.  No wheezing, S1-S2,  normal heart sounds.  PMI normal.  ABDOMEN:  Benign.  Bowel sounds positive.  No AAA, no tenderness.  No  hepatosplenomegaly or hepatojugular reflux.  Distal pulses are intact.  No edema.  NEURO:  Nonfocal.  SKIN:  Warm and dry.  MUSCULOSKELETAL:  No muscular weakness.   Electrocardiogram is essentially normal with sinus rhythm, QT interval  was not prolonged.  There is no evidence of WPW or pre-excitation.   IMPRESSION:  1. Palpitations, some benign, may be occult paroxysmal      supraventricular tachycardia; however, they are not happening      frequently enough to either give her a monitor for, or treat      specifically; however, given her constellation of borderline      hypertension, vasomotor symptoms and palpitations, I think it would      be best to stop her  hydrochlorothiazide and start Toprol 50 a day.      I cautioned her that this may cause fatigue.  However, I think it      may be better for her than being on a diuretic.  2. Hypertension, hydrochlorothiazide to be stopped, switch to Toprol      if this does not work.  I would still stop her diuretic, as they      exacerbate any palpitations and treat her with an ACE inhibitor.  3. Menopause.  It is okay for the patient to start estrogen.  I see no      contraindications for it.  4. Given her palpitations, I think we will have her come back for a      treadmill test.  Her baseline EKG is normal enough, and I like to      see what her blood pressure does with exercise, and also rule out      any exercise-induced arrhythmias.  She will also have a 2-D      echocardiogram to rule out MVP or other structural heart disease      that may be contributing to her blood pressure and palpitations.     Noralyn Pick. Eden Emms, MD, [facility]  Electronically Signed    PCN/MedQ  DD: 07/27/2007  DT: 07/27/2007  Job #: [id number]   cc:   Cyril Mourning,, MSN -[facility], NP  Mila Homer. Sudie Bailey, M.D.

## 2010-08-25 NOTE — Procedures (Signed)
[facility]                              EXERCISE TREADMILL   BATOOL, MAJID                      MRN:          [id number]  DATE:08/08/2007                            DOB:          [birth year]    Renee Benitez is a 60 year old patient who has a history of palpitations,  SVT, hypertension.  There is question of starting estrogen therapy.   Patient was having atypical chest pain as well.  Study was done to rule  out ischemia; exercise stress test.   Patient exercised 7 minutes on the standard Bruce protocol.  Exercise  was stopped due to fatigue.  Maximum heart rate went from 73 to 184.  This was 110% of predicted, maximal age predicted heart rate.  Resting  blood pressure was 168/78.  Blood pressure increased to 218/90.   A resting EKG was normal with stress.  There was no significant ischemia  and occasional PACs and PVCs.   IMPRESSION:  Negative exercise stress test at a  good work load.  Patient has had beta blockers added to her regimen for her palpitations,  history of SVT and hypertension.  We will see her back in the office.     Noralyn Pick. Eden Emms, MD, [facility]  Electronically Signed    PCN/MedQ  DD: 08/08/2007  DT: 08/08/2007  Job #: [id number]

## 2010-08-28 NOTE — Op Note (Signed)
NAME:  Renee Benitez, Renee Benitez                         ACCOUNT NO.:  [id number]   MEDICAL RECORD NO.:  [id number]                   PATIENT TYPE:  AMB   LOCATION:  DAY                                  FACILITY:  [facility]   PHYSICIAN:  R. Roetta Sessions, M.D.              DATE OF BIRTH:  Jul 14, 1950   DATE OF PROCEDURE:  12/04/2003  DATE OF DISCHARGE:                                 OPERATIVE REPORT   PROCEDURE:  Screening colonoscopy.   INDICATIONS FOR PROCEDURE:  The patient is a 60 year old lady sent over by  the courtesy of Drs. Buist and Knowlton for colorectal carcinoma screening.  She has occasional paper hematochezia (i.e., one to two times monthly).  Otherwise, she has normal bowel function.  No abdominal pain.  No prior  colon imaging.  Family history is significant in that her maternal  grandmother succumbed to colorectal carcinoma in her 39s.  This approach has  been discussed with the patient at length.  The potential risks, benefits,  and alternatives have been reviewed.  Please see my handwritten H&P.   PROCEDURE:  O2 saturation, blood pressure, pulses, and respirations were  monitored throughout the entirety of the procedure.  Conscious sedation was  with Versed 4 mg IV, Demerol 100 mg IV in divided doses.  The instrument  used was the Olympus video chip system.   FINDINGS:  Digital rectal examination revealed no abnormalities.   ENDOSCOPIC FINDINGS:  The prep was good.   Rectum:  Examination of the rectal mucosa including retroflex view of the  anal verge revealed only a friable anal canal.  The rectal mucosa appeared  normal.   Colon:  The colonic mucosa was surveyed from the rectosigmoid junction  through the left, transverse, right colon to the area of the appendiceal  orifice, ileocecal valve, and cecum.  These structures were well-seen and  photographed for the record.  From this level, the scope was slowly  withdrawn.  All previously mentioned mucosal surfaces were again  seen.  The  colonic mucosa appeared normal.  The patient tolerated the procedure well  and was reactive in endoscopy.   IMPRESSION:  Friable anal canal.  Otherwise normal rectum, normal colon.   I suspect the patient has had trivial bleeding from her anorectum.   RECOMMENDATIONS:  1. Hemorrhoid literature provided to [name].  2. Anusol HC suppositories one per rectum at bedtime x10 days.  3. Consider repeat colonoscopy in 10 years.      ___________________________________________                                            Jonathon Bellows, M.D.   RMR/MEDQ  D:  12/04/2003  T:  12/04/2003  Job:  [id number]   cc:   [name]  [address]  [place]  [place] [address]  Fax: [phone]   Mila Homer. Sudie Bailey, M.D.  [address] [place], [place] [address]  Fax: [phone]

## 2010-10-19 ENCOUNTER — Other Ambulatory Visit: Payer: Self-pay | Admitting: Cardiology

## 2010-10-19 MED ORDER — DILTIAZEM HCL ER COATED BEADS 120 MG PO CP24: 120.0000 mg | ORAL_CAPSULE | Freq: Every day | ORAL | Status: DC

## 2010-10-19 NOTE — Telephone Encounter (Signed)
.   Requested Prescriptions   Pending Prescriptions Disp Refills  . diltiazem (CARDIZEM CD) 120 MG 24 hr capsule 90 capsule 1    Sig: Take 1 capsule (120 mg total) by mouth daily.

## 2010-10-20 MED ORDER — DILTIAZEM HCL ER COATED BEADS 120 MG PO CP24: 120.0000 mg | ORAL_CAPSULE | Freq: Every day | ORAL | Status: DC

## 2010-10-21 ENCOUNTER — Other Ambulatory Visit: Payer: Self-pay | Admitting: Cardiology

## 2010-10-21 MED ORDER — DILTIAZEM HCL ER COATED BEADS 120 MG PO CP24: 120.0000 mg | ORAL_CAPSULE | Freq: Every day | ORAL | Status: DC

## 2010-10-21 NOTE — Telephone Encounter (Signed)
.   Requested Prescriptions   Pending Prescriptions Disp Refills  . diltiazem (CARDIZEM CD) 120 MG 24 hr capsule 90 capsule 6    Sig: Take 1 capsule (120 mg total) by mouth daily.

## 2010-11-12 ENCOUNTER — Other Ambulatory Visit: Payer: Self-pay | Admitting: *Deleted

## 2010-11-12 MED ORDER — DILTIAZEM HCL 240 MG PO CP24: 240.0000 mg | ORAL_CAPSULE | Freq: Every day | ORAL | Status: DC

## 2010-11-13 ENCOUNTER — Telehealth: Payer: Self-pay | Admitting: *Deleted

## 2010-11-13 MED ORDER — DILTIAZEM HCL 240 MG PO CP24: 240.0000 mg | ORAL_CAPSULE | Freq: Every day | ORAL | Status: DC

## 2010-11-13 NOTE — Telephone Encounter (Signed)
..   Requested Prescriptions   Pending Prescriptions Disp Refills  . diltiazem (DILACOR XR) 240 MG 24 hr capsule 90 capsule 3    Sig: Take 1 capsule (240 mg total) by mouth daily.

## 2010-11-18 ENCOUNTER — Telehealth: Payer: Self-pay | Admitting: *Deleted

## 2010-11-19 MED ORDER — DILTIAZEM HCL ER COATED BEADS 120 MG PO CP24: 120.0000 mg | ORAL_CAPSULE | Freq: Every day | ORAL | Status: DC

## 2010-11-19 NOTE — Telephone Encounter (Signed)
rx sent to [facility]

## 2010-11-19 NOTE — Telephone Encounter (Signed)
Sent rx to [facility] the correct drug store

## 2011-02-01 ENCOUNTER — Ambulatory Visit (INDEPENDENT_AMBULATORY_CARE_PROVIDER_SITE_OTHER): Payer: 59 | Admitting: Cardiology

## 2011-02-01 ENCOUNTER — Encounter: Payer: Self-pay | Admitting: Cardiology

## 2011-02-01 VITALS — BP 128/71 | HR 78 | Resp 18 | Ht 66.0 in | Wt 148.0 lb

## 2011-02-01 DIAGNOSIS — I10 Essential (primary) hypertension: Secondary | ICD-10-CM

## 2011-02-01 DIAGNOSIS — I4891 Unspecified atrial fibrillation: Secondary | ICD-10-CM

## 2011-02-01 MED ORDER — DILTIAZEM HCL ER COATED BEADS 120 MG PO CP24: 120.0000 mg | ORAL_CAPSULE | Freq: Every day | ORAL | Status: DC

## 2011-02-01 NOTE — Assessment & Plan Note (Signed)
Blood pressure well controlled

## 2011-02-01 NOTE — Assessment & Plan Note (Signed)
Symptomatically controlled on current regimen. Continue observation and an annual follow up for now.

## 2011-02-01 NOTE — Progress Notes (Signed)
Clinical Summary Renee Benitez is a 60 y.o.female presenting for routine followup. She states that she has done well, only rare palpitations, used a short acting Cardizem dose on one occasion. Otherwise she is not describing any progressive functional limitation, no chest pain or unusual shortness of breath.  She seems to be most comfortable with observation on current medical therapy. I have discussed with her the fact that we can pursue other options such as antiarrhythmic therapy if her symptoms progress.   No Known Allergies  Medication list reviewed.  Past Medical History  Diagnosis Date  . Atrial fibrillation     Paroxysmal, CHADS2 score 1  . Hypertension   . Menopausal syndrome     Past Surgical History  Procedure Date  . Appendectomy   . Abdominal hysterectomy     Family History  Problem Relation Age of Onset  . Coronary artery disease Father   . Arrhythmia Sister     Reportedly had ablation of SVT    Social History Renee Benitez reports that she has never smoked. She has never used smokeless tobacco. Renee Benitez reports that she drinks alcohol.  Review of Systems As outlined above, otherwise negative.  Physical Examination Filed Vitals:   02/01/11 1536  BP: 128/71  Pulse: 78  Resp: 18   Normally nourished appearing woman in no acute distress.  HEENT: Conjunctiva and lids normal, oropharynx clear.  Neck: Supple, no elevated JVP or bruits.  Lungs: Clear to auscultation, nontender.  Cardiac: Regular rate and rhythm, no mid systolic click or S3.  Abdomen: Soft, nontender, bowel sounds present.  Skin: Warm and dry.    Problem List and Plan

## 2011-02-01 NOTE — Patient Instructions (Signed)
**Note De-identified Renee Benitez Obfuscation** Your physician recommends that you continue on your current medications as directed. Please refer to the Current Medication list given to you today.  Your physician recommends that you schedule a follow-up appointment in: 1 year  

## 2011-02-02 ENCOUNTER — Other Ambulatory Visit: Payer: Self-pay | Admitting: *Deleted

## 2011-02-02 MED ORDER — DILTIAZEM HCL ER COATED BEADS 120 MG PO CP24: 120.0000 mg | ORAL_CAPSULE | Freq: Every day | ORAL | Status: DC

## 2011-03-23 ENCOUNTER — Encounter: Payer: Self-pay | Admitting: Internal Medicine

## 2011-03-25 ENCOUNTER — Encounter: Payer: Self-pay | Admitting: Gastroenterology

## 2011-03-25 ENCOUNTER — Ambulatory Visit (INDEPENDENT_AMBULATORY_CARE_PROVIDER_SITE_OTHER): Payer: 59 | Admitting: Gastroenterology

## 2011-03-25 VITALS — BP 134/72 | HR 68 | Temp 98.2°F | Ht 66.0 in | Wt 150.0 lb

## 2011-03-25 DIAGNOSIS — K921 Melena: Secondary | ICD-10-CM

## 2011-03-25 MED ORDER — HYDROCORTISONE ACETATE 25 MG RE SUPP: 25.0000 mg | Freq: Two times a day (BID) | RECTAL | Status: AC

## 2011-03-25 NOTE — Progress Notes (Signed)
Primary Care Physician:  Milana Obey, MD Primary Gastroenterologist:  Dr. [name]  Chief Complaint  Patient presents with  . Rectal Bleeding    after stools    HPI:   Renee Benitez is a pleasant 60 year old female who presents today as a self-referral secondary to rectal bleeding. She is a Engineer, civil (consulting) by trade. She had a colonoscopy by Dr. [name] in August 2005 secondary to rectal bleeding, with findings of friable anal canal, otherwise normal. Her maternal grandmother had a history of rectal cancer.  She reports no change in bowel habits, constipation, diarrhea. She does have concerns about lower back pain and pressure, especially in the morning when waking. She has no unexplained weight loss, lack of appetite, N/V. She has noticed increasing frequency of brbpr with BMs. Denies any anal pruritis or discomfort.  Her friend just passed away from colon cancer at age 70, and [name] is quite concerned about her own health.    Past Medical History  Diagnosis Date  . Atrial fibrillation     Paroxysmal, CHADS2 score 1  . Hypertension   . Menopausal syndrome     Past Surgical History  Procedure Date  . Appendectomy   . Abdominal hysterectomy   . Colonoscopy 12/04/03    Friable anal canal otherwise normal rectum and colon    Current Outpatient Prescriptions  Medication Sig Dispense Refill  . aspirin 81 MG tablet Take 81 mg by mouth daily.        . calcium carbonate (OS-CAL) 600 MG TABS Take 600 mg by mouth daily.        Marland Kitchen diltiazem (CARDIZEM CD) 120 MG 24 hr capsule Take 1 capsule (120 mg total) by mouth daily.  90 capsule  3  . diltiazem (CARDIZEM) 30 MG tablet Take 1 tablet (30 mg total) by mouth every 6 (six) hours as needed. Take 1 to 2 tablets every 6 hrs. as needed for palpitations  30 tablet  2  . ergocalciferol (VITAMIN D2) 50000 UNITS capsule Take 50,000 Units by mouth once a week.        . estradiol (VIVELLE-DOT) 0.1 MG/24HR Place 1 patch onto the skin 2 (two) times a week.         . Multiple Vitamins-Minerals (MULTIVITAMIN WITH MINERALS) tablet Take 1 tablet by mouth daily.        . hydrocortisone (ANUSOL-HC) 25 MG suppository Place 1 suppository (25 mg total) rectally every 12 (twelve) hours. For 10 days  20 suppository  0    Allergies as of 03/25/2011  . (No Known Allergies)    Family History  Problem Relation Age of Onset  . Coronary artery disease Father   . Arrhythmia Sister     Reportedly had ablation of SVT  . Colon cancer Maternal Grandmother     History   Social History  . Marital Status: Married    Spouse Name: N/A    Number of Children: N/A  . Years of Education: N/A   Occupational History  . Nurse     [employer]   Social History Main Topics  . Smoking status: Never Smoker   . Smokeless tobacco: Never Used  . Alcohol Use: Yes     Rarely  . Drug Use: No  . Sexually Active: Not on file   Other Topics Concern  . Not on file   Social History Narrative  . No narrative on file    Review of Systems: Gen: Denies any fever, chills, fatigue, weight loss, lack  of appetite.  CV: Denies chest pain, heart palpitations, peripheral edema, syncope.  Resp: Denies shortness of breath at rest or with exertion. Denies wheezing or cough.  GI: Denies dysphagia or odynophagia. Denies jaundice, hematemesis, fecal incontinence. GU : Denies urinary burning, urinary frequency, urinary hesitancy MS: Denies joint pain, muscle weakness, cramps, or limitation of movement.  Derm: Denies rash, itching, dry skin Psych: Denies depression, anxiety, memory loss, and confusion Heme: Denies bruising, bleeding, and enlarged lymph nodes.  Physical Exam: BP 134/72  Pulse 68  Temp(Src) 98.2 F (36.8 C) (Temporal)  Ht 5\' 6"  (1.676 m)  Wt 150 lb (68.04 kg)  BMI 24.21 kg/m2 General:   Alert and oriented. Pleasant and cooperative. Well-nourished and well-developed.  Head:  Normocephalic and atraumatic. Eyes:  Without icterus, sclera clear and conjunctiva  pink.  Ears:  Normal auditory acuity. Nose:  No deformity, discharge,  or lesions. Mouth:  No deformity or lesions, oral mucosa pink.  Neck:  Supple, without mass or thyromegaly. Lungs:  Clear to auscultation bilaterally. No wheezes, rales, or rhonchi. No distress.  Heart:  S1, S2 present without murmurs appreciated.  Abdomen:  +BS, soft, non-tender and non-distended. No HSM noted. No guarding or rebound. No masses appreciated.  Rectal:  Deferred  Msk:  Symmetrical without gross deformities. Normal posture. Pulses:  Normal pulses noted. Extremities:  Without clubbing or edema. Neurologic:  Alert and  oriented x4;  grossly normal neurologically. Skin:  Intact without significant lesions or rashes. Cervical Nodes:  No significant cervical adenopathy. Psych:  Alert and cooperative. Normal mood and affect.

## 2011-03-25 NOTE — Patient Instructions (Signed)
Continue to eat a healthy diet as you are doing.  We have called in suppositories to your pharmacy to take for 10 days, twice a day.  We also have set you up for a colonoscopy with Dr. [name] in the near future.  Have a Renee Benitez Christmas!!

## 2011-03-26 DIAGNOSIS — K921 Melena: Secondary | ICD-10-CM | POA: Insufficient documentation

## 2011-03-26 NOTE — Assessment & Plan Note (Signed)
60 year old female with increasing episodes of hematochezia although no other associated factors such as abdominal pain, change in bowel habits, wt loss. She does note vague lower back pain which concerns her. Last colonoscopy was in 2005 with friable anal canal. Likely hematochezia is due to benign anorectal source; however, as it has increased in frequency, it would be appropriate to proceed with a colonoscopy. She does have a distant FH of colon cancer in her maternal grandmother. On a side note, pt is quite concerned because her friend, age 61, recently passed away from colon cancer unexpectedly.  We will proceed with TCS with Dr. [name] in near future: the risks, benefits, and alternatives have been discussed with the patient in detail. The patient states understanding and desires to proceed. Anusol suppositories BID X 10 days provided Continue high fiber diet as she is already doing

## 2011-03-26 NOTE — Progress Notes (Signed)
Cc to PCP 

## 2011-04-16 ENCOUNTER — Encounter (HOSPITAL_COMMUNITY): Payer: Self-pay | Admitting: Pharmacy Technician

## 2011-04-26 ENCOUNTER — Ambulatory Visit (HOSPITAL_COMMUNITY): Admission: RE | Admit: 2011-04-26 | Payer: 59 | Source: Ambulatory Visit | Admitting: Internal Medicine

## 2011-04-26 ENCOUNTER — Encounter (HOSPITAL_COMMUNITY): Admission: RE | Payer: Self-pay | Source: Ambulatory Visit

## 2011-04-26 SURGERY — COLONOSCOPY
Anesthesia: Moderate Sedation

## 2011-08-05 ENCOUNTER — Other Ambulatory Visit (HOSPITAL_COMMUNITY): Payer: Self-pay | Admitting: Family Medicine

## 2011-08-05 DIAGNOSIS — Z139 Encounter for screening, unspecified: Secondary | ICD-10-CM

## 2011-08-09 ENCOUNTER — Ambulatory Visit (HOSPITAL_COMMUNITY)
Admission: RE | Admit: 2011-08-09 | Discharge: 2011-08-09 | Disposition: A | Discharge status: Home / Self Care | Payer: 59 | Source: Ambulatory Visit | Attending: Family Medicine | Admitting: Family Medicine

## 2011-08-09 DIAGNOSIS — Z1231 Encounter for screening mammogram for malignant neoplasm of breast: Secondary | ICD-10-CM | POA: Insufficient documentation

## 2011-08-09 DIAGNOSIS — N6459 Other signs and symptoms in breast: Secondary | ICD-10-CM | POA: Insufficient documentation

## 2011-08-09 DIAGNOSIS — Z139 Encounter for screening, unspecified: Secondary | ICD-10-CM

## 2011-08-11 ENCOUNTER — Other Ambulatory Visit: Payer: Self-pay | Admitting: Family Medicine

## 2011-08-11 DIAGNOSIS — R928 Other abnormal and inconclusive findings on diagnostic imaging of breast: Secondary | ICD-10-CM

## 2011-08-12 ENCOUNTER — Other Ambulatory Visit: Payer: Self-pay | Admitting: Family Medicine

## 2011-08-12 DIAGNOSIS — R928 Other abnormal and inconclusive findings on diagnostic imaging of breast: Secondary | ICD-10-CM

## 2011-08-16 ENCOUNTER — Ambulatory Visit
Admission: RE | Admit: 2011-08-16 | Discharge: 2011-08-16 | Disposition: A | Discharge status: Home / Self Care | Payer: 59 | Source: Ambulatory Visit | Attending: Family Medicine | Admitting: Family Medicine

## 2011-08-16 DIAGNOSIS — R928 Other abnormal and inconclusive findings on diagnostic imaging of breast: Secondary | ICD-10-CM

## 2011-08-17 IMAGING — MG MM DIGITAL DIAGNOSTIC UNILAT*R*
2 series · 2 of 2 positions shown · non-contrast
Comparison: [DATE] and [DATE]

CLINICAL DATA: Possible mass right breast identified on one-view
of the recent screening mammogram.

DIGITAL DIAGNOSTIC RIGHT MAMMOGRAM WITHOUT CAD AND RIGHT BREAST
ULTRASOUND:

[R CC]
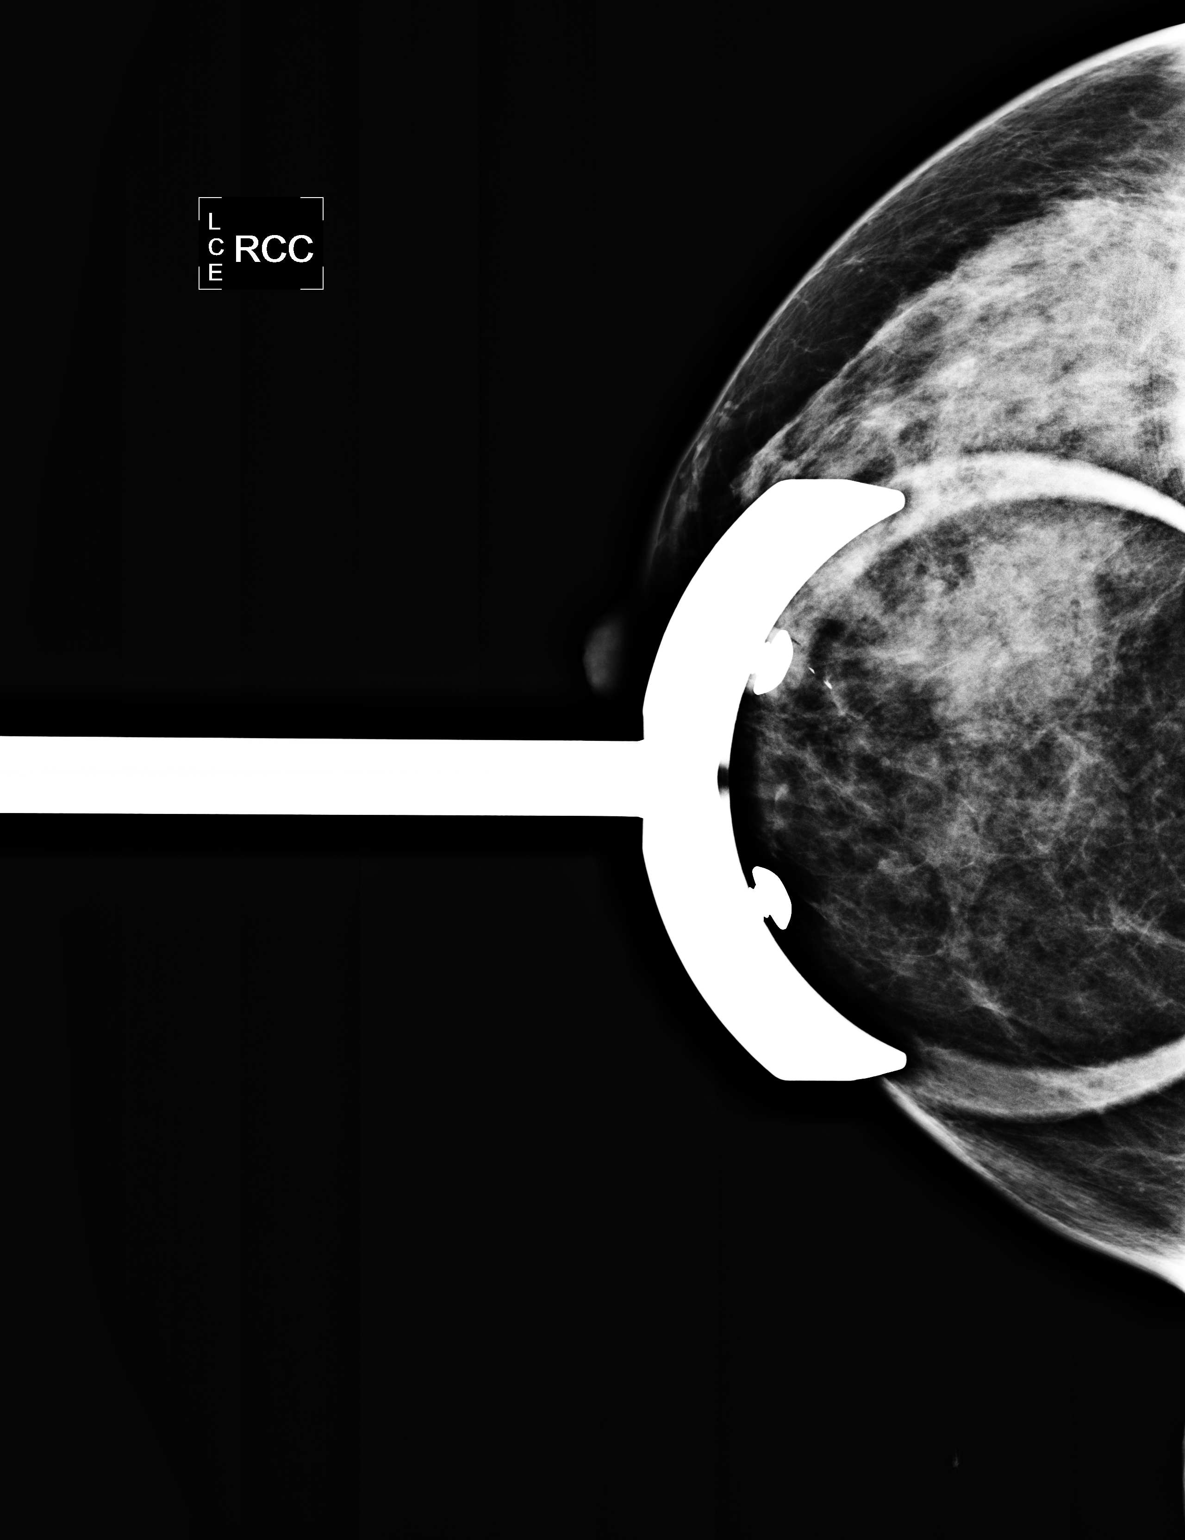

[R ML]
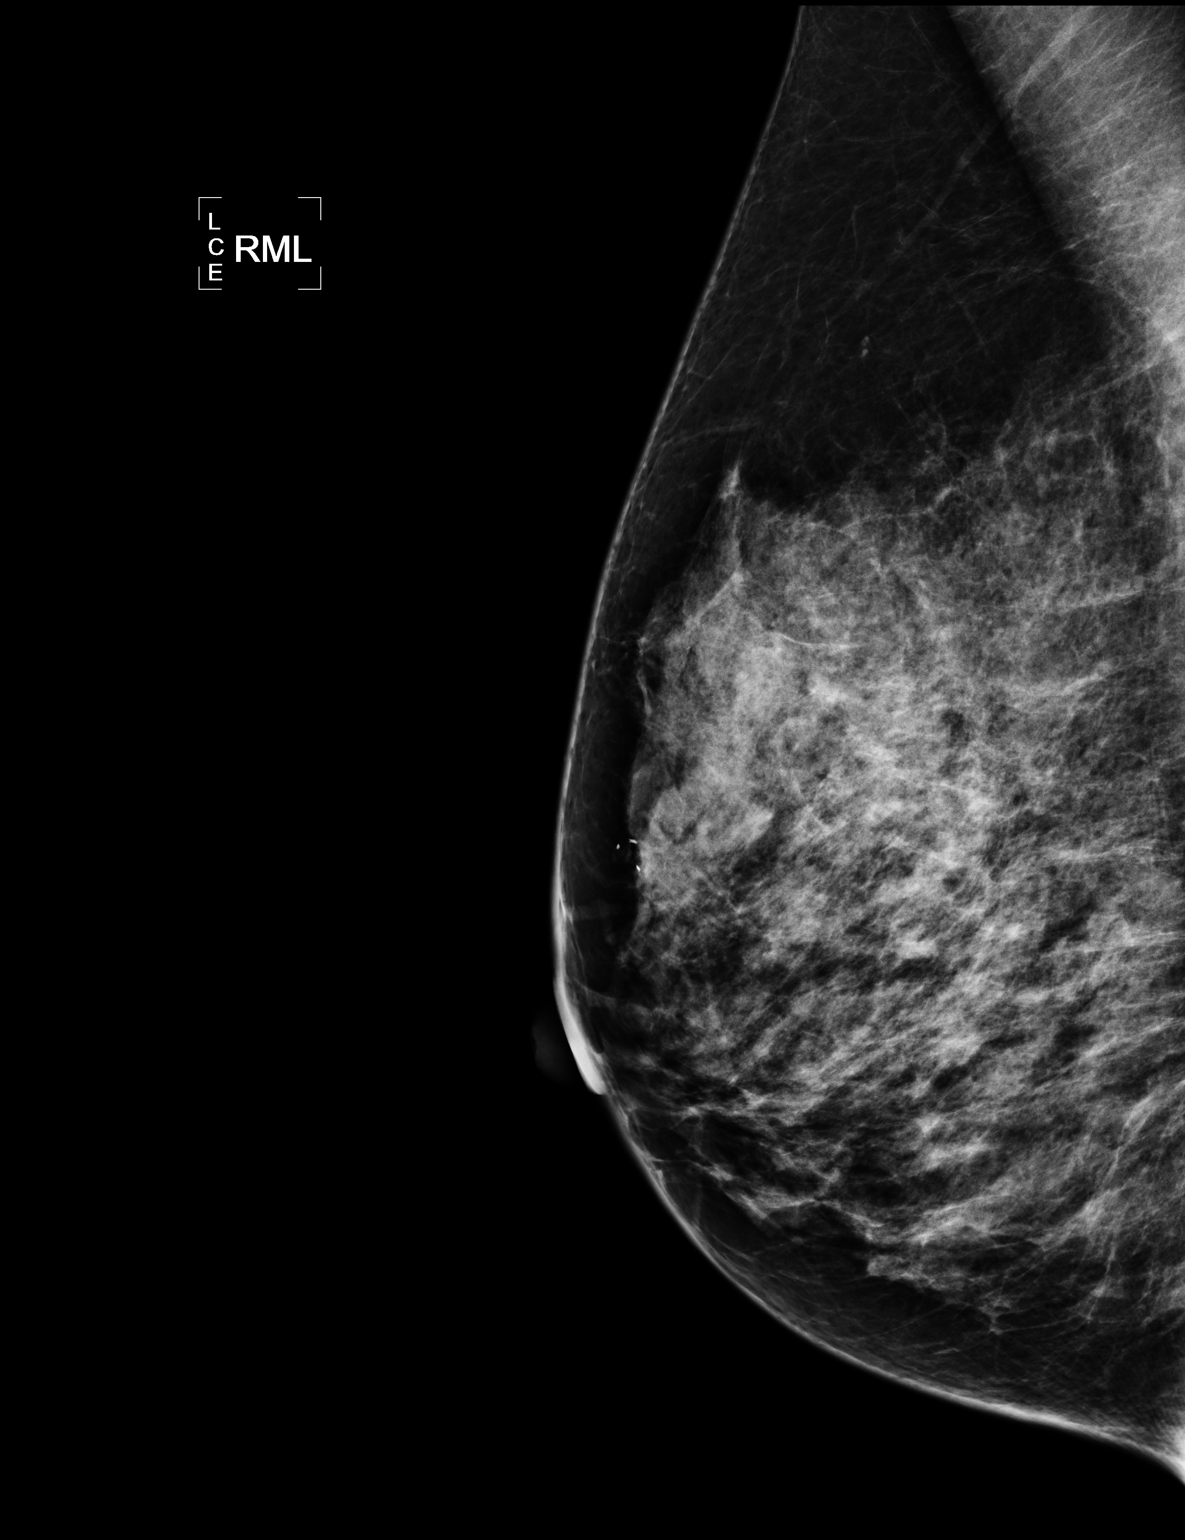

[2 of 2 positions shown; findings below may reference images not displayed]

FINDINGS: Focal spot compression view of the medial right breast
shows dispersion of breast tissue, without a discrete mass or
distortion.  The breast parenchyma is heterogeneously dense.  On
the 90 degrees lateral view of the right breast, no mass or
distortion is identified.

On physical exam, no mass is palpated in the medial right breast.

Ultrasound is performed, showing normal fibroglandular breast
tissue in the medial right breast.  No solid or cystic mass or
abnormal shadowing is seen.
IMPRESSION: No evidence of malignancy in the right breast.  Findings on the
recent screening mammogram are most consistent with overlap of
normal breast tissue.

Bilateral screening mammogram in 1 year is recommended.

BI-RADS CATEGORY 1:  Negative.

## 2012-02-03 ENCOUNTER — Ambulatory Visit: Payer: 59 | Admitting: Cardiology

## 2012-02-25 ENCOUNTER — Encounter: Payer: Self-pay | Admitting: Cardiology

## 2012-02-25 ENCOUNTER — Ambulatory Visit (INDEPENDENT_AMBULATORY_CARE_PROVIDER_SITE_OTHER): Payer: 59 | Admitting: Cardiology

## 2012-02-25 VITALS — BP 134/66 | HR 66 | Ht 66.0 in | Wt 148.0 lb

## 2012-02-25 DIAGNOSIS — I4891 Unspecified atrial fibrillation: Secondary | ICD-10-CM

## 2012-02-25 DIAGNOSIS — I10 Essential (primary) hypertension: Secondary | ICD-10-CM

## 2012-02-25 MED ORDER — DILTIAZEM HCL 30 MG PO TABS: 30.0000 mg | ORAL_TABLET | Freq: Four times a day (QID) | ORAL | Status: DC | PRN

## 2012-02-25 MED ORDER — DILTIAZEM HCL ER COATED BEADS 120 MG PO CP24: 120.0000 mg | ORAL_CAPSULE | Freq: Every day | ORAL | Status: DC

## 2012-02-25 NOTE — Assessment & Plan Note (Signed)
Paroxysmal, brief episodes. Continue current strategy. Refills provided for Cardizem. Follow up arranged.

## 2012-02-25 NOTE — Assessment & Plan Note (Signed)
Blood pressure mildly increased today. No changes made to current regimen. She typically has better blood pressure when she checks it periodically.

## 2012-02-25 NOTE — Progress Notes (Signed)
   Clinical Summary [name] is a 61 y.o.female presenting for followup. She was seen in October of last year. Continues to do relatively well, reports perhaps 8 episodes of palpitations that were likely atrial fibrillation. P.r.n. Cardizem has been effective in these instances. She has had no hospitalizations.  ECG today shows sinus rhythm. She needs refills for both forms of Cardizem.   No Known Allergies  Current Outpatient Prescriptions  Medication Sig Dispense Refill  . aspirin 81 MG tablet Take 81 mg by mouth daily.        . beta carotene w/minerals (OCUVITE) tablet Take 1 tablet by mouth daily.      . calcium carbonate (OS-CAL) 600 MG TABS Take 600 mg by mouth daily.        Marland Kitchen diltiazem (CARDIZEM CD) 120 MG 24 hr capsule Take 1 capsule (120 mg total) by mouth daily.  90 capsule  3  . diltiazem (CARDIZEM) 30 MG tablet Take 1 tablet (30 mg total) by mouth every 6 (six) hours as needed. Take 1 to 2 tablets every 6 hrs. as needed for palpitations  30 tablet  6  . ergocalciferol (VITAMIN D2) 50000 UNITS capsule Take 50,000 Units by mouth once a week.        . estradiol (VIVELLE-DOT) 0.1 MG/24HR Place 1 patch onto the skin 2 (two) times a week.        . Multiple Vitamins-Minerals (MULTIVITAMIN WITH MINERALS) tablet Take 1 tablet by mouth daily.        . [DISCONTINUED] diltiazem (CARDIZEM CD) 120 MG 24 hr capsule Take 1 capsule (120 mg total) by mouth daily.  90 capsule  3  . [DISCONTINUED] diltiazem (CARDIZEM) 30 MG tablet Take 1 tablet (30 mg total) by mouth every 6 (six) hours as needed. Take 1 to 2 tablets every 6 hrs. as needed for palpitations  30 tablet  2    Past Medical History  Diagnosis Date  . Atrial fibrillation     Paroxysmal, CHADS2 score 1  . Hypertension   . Menopausal syndrome     Social History [name] reports that she has never smoked. She has never used smokeless tobacco. [name] reports that she drinks alcohol.  Review of Systems Working full time.  No reported bleeding episodes. No focal motor weakness or speech problems. No chest pain. Otherwise negative.  Physical Examination Filed Vitals:   02/25/12 1447  BP: 134/66  Pulse: 66   Filed Weights   02/25/12 1447  Weight: 148 lb (67.132 kg)    Normally nourished appearing woman in no acute distress.  HEENT: Conjunctiva and lids normal, oropharynx clear.  Neck: Supple, no elevated JVP or bruits.  Lungs: Clear to auscultation, nontender.  Cardiac: Regular rate and rhythm, no mid systolic click or S3.  Abdomen: Soft, nontender, bowel sounds present.  Skin: Warm and dry.  Problem List and Plan   Atrial fibrillation Paroxysmal, brief episodes. Continue current strategy. Refills provided for Cardizem. Follow up arranged.  Essential hypertension, benign Blood pressure mildly increased today. No changes made to current regimen. She typically has better blood pressure when she checks it periodically.    [name], M.D., F.A.C.C.

## 2012-02-25 NOTE — Patient Instructions (Addendum)
Your physician recommends that you schedule a follow-up appointment in: ONE YEAR WITH SM  Your refills have been sent as advised

## 2012-02-29 ENCOUNTER — Other Ambulatory Visit: Payer: Self-pay | Admitting: Cardiology

## 2012-02-29 MED ORDER — DILTIAZEM HCL 30 MG PO TABS: 30.0000 mg | ORAL_TABLET | Freq: Four times a day (QID) | ORAL | Status: DC | PRN

## 2012-09-08 DIAGNOSIS — H16409 Unspecified corneal neovascularization, unspecified eye: Secondary | ICD-10-CM | POA: Insufficient documentation

## 2012-09-08 DIAGNOSIS — H35413 Lattice degeneration of retina, bilateral: Secondary | ICD-10-CM | POA: Insufficient documentation

## 2012-09-08 DIAGNOSIS — H2510 Age-related nuclear cataract, unspecified eye: Secondary | ICD-10-CM | POA: Insufficient documentation

## 2012-10-03 ENCOUNTER — Other Ambulatory Visit (HOSPITAL_COMMUNITY): Payer: Self-pay | Admitting: Family Medicine

## 2012-10-03 DIAGNOSIS — Z139 Encounter for screening, unspecified: Secondary | ICD-10-CM

## 2012-10-10 ENCOUNTER — Ambulatory Visit (HOSPITAL_COMMUNITY)
Admission: RE | Admit: 2012-10-10 | Discharge: 2012-10-10 | Disposition: A | Discharge status: Home / Self Care | Payer: 59 | Source: Ambulatory Visit | Attending: Family Medicine | Admitting: Family Medicine

## 2012-10-10 DIAGNOSIS — Z1231 Encounter for screening mammogram for malignant neoplasm of breast: Secondary | ICD-10-CM | POA: Insufficient documentation

## 2012-10-10 DIAGNOSIS — Z139 Encounter for screening, unspecified: Secondary | ICD-10-CM

## 2012-10-10 IMAGING — MG MM DIGITAL SCREENING BILAT
5 series · 5 of 5 positions shown · non-contrast
Comparison: Previous exams.

CLINICAL DATA: Screening.

DIGITAL SCREENING BILATERAL MAMMOGRAM WITH CAD

[L CC]
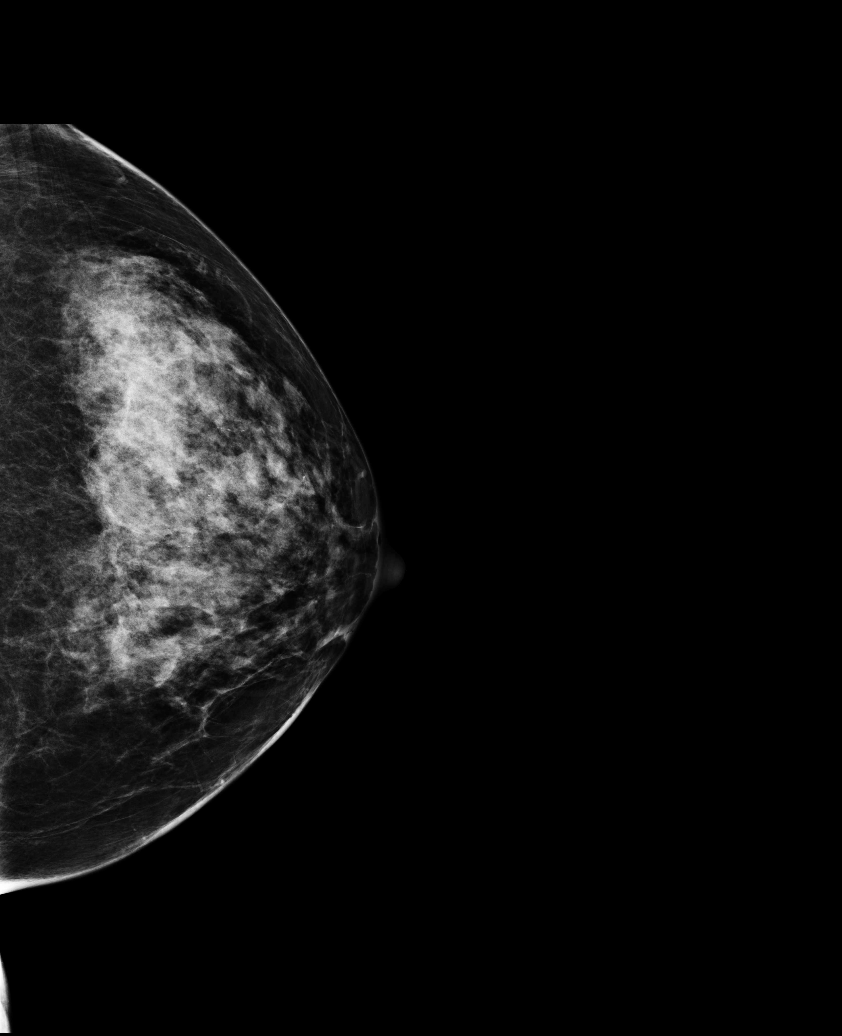

[L MLO (1 of 2)]
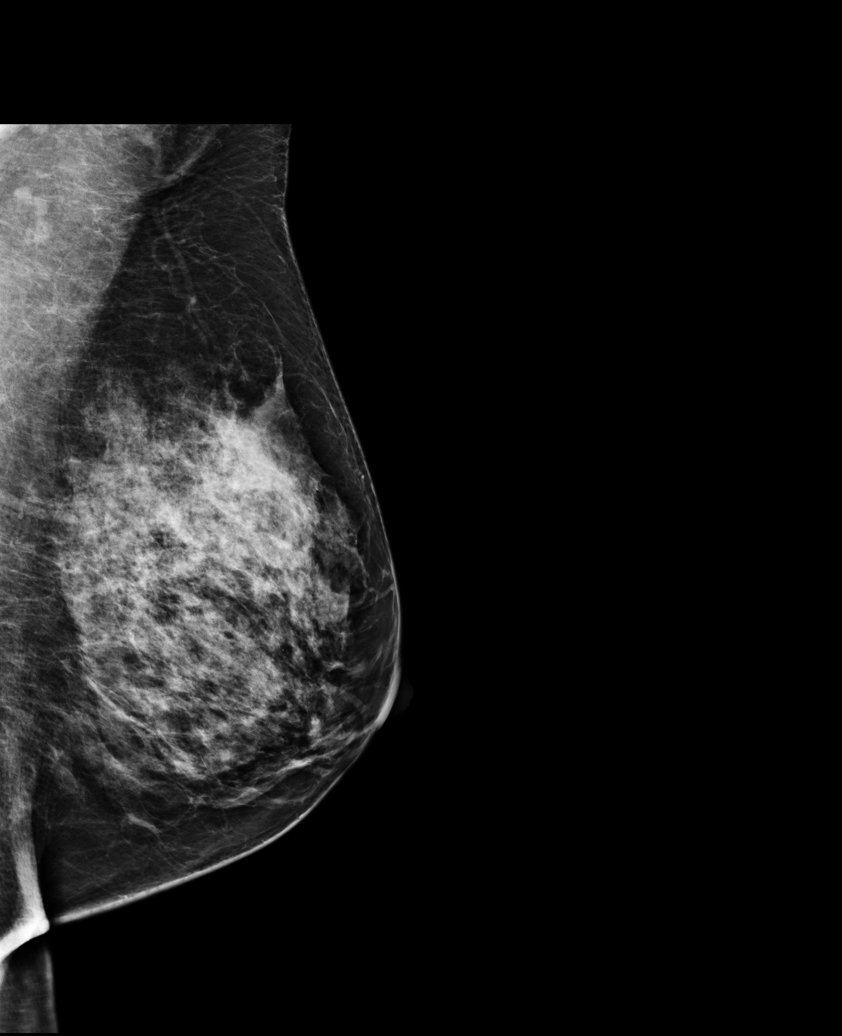

[R CC]
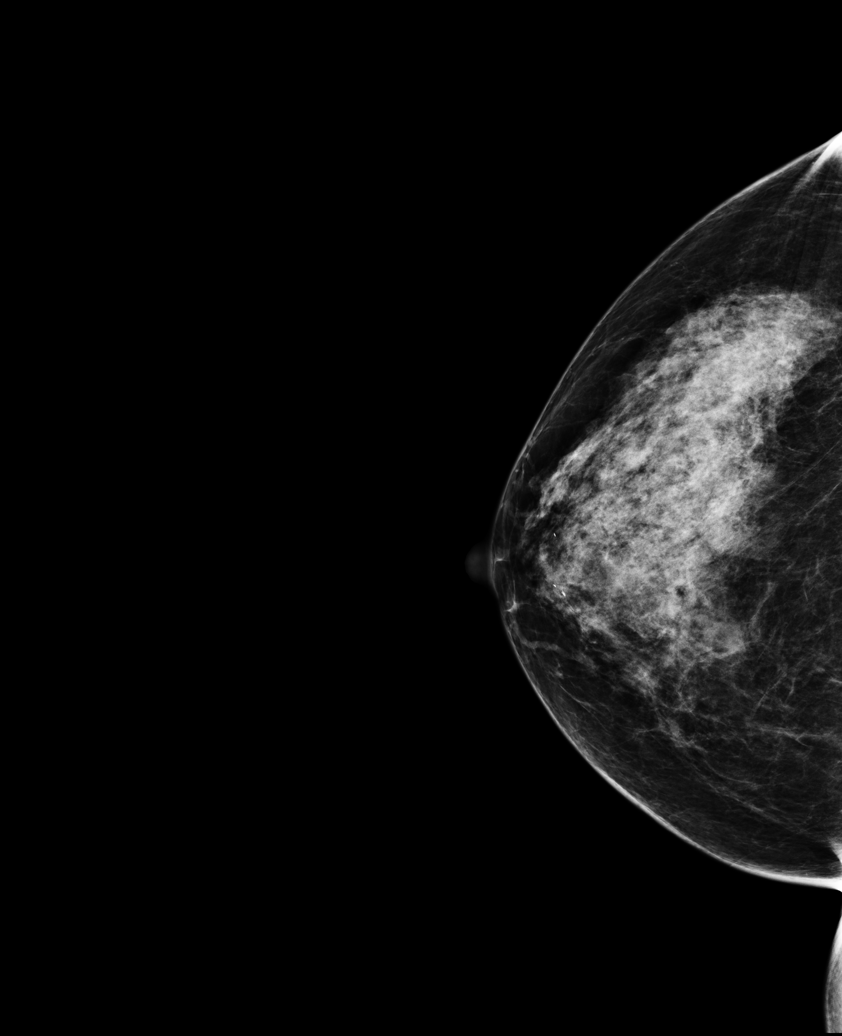

[R MLO]
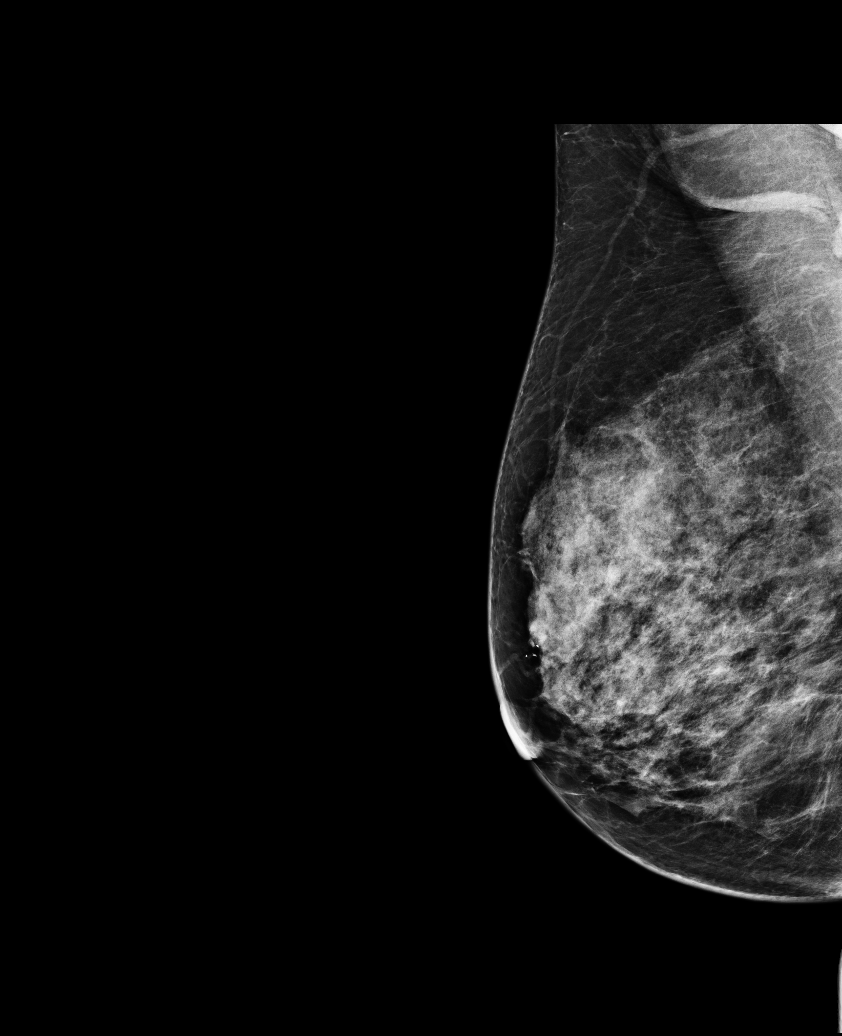

[L MLO (2 of 2)]
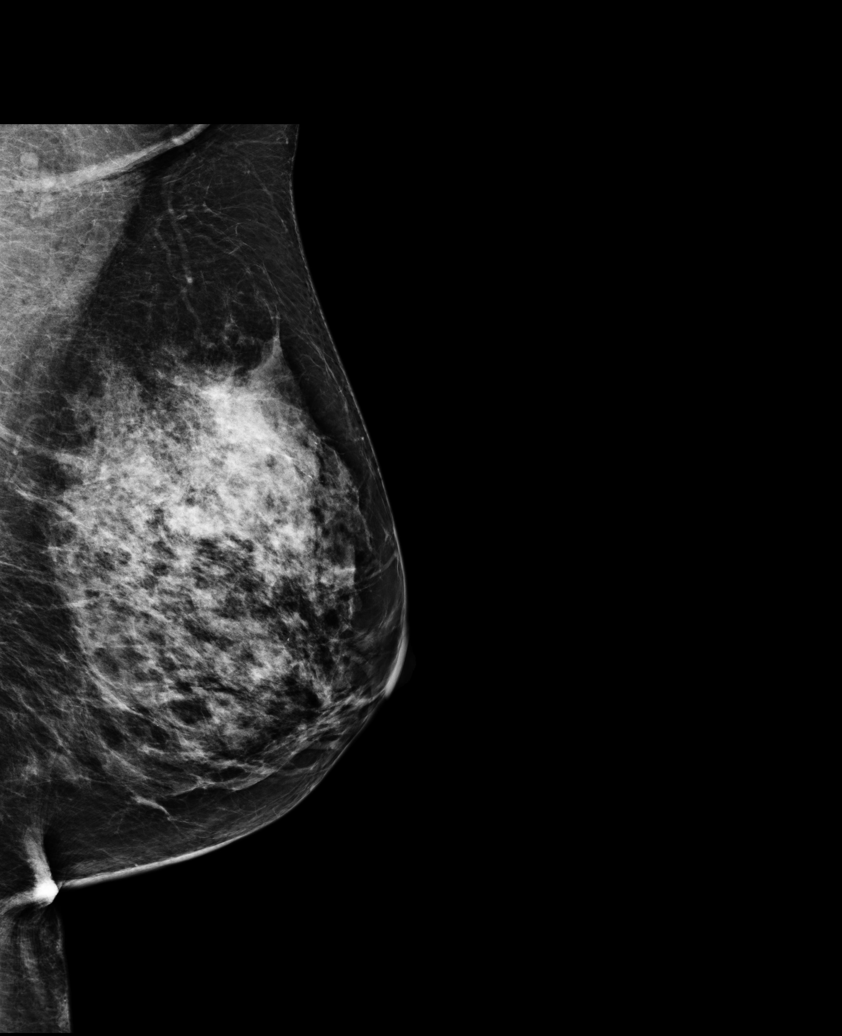

[5 of 5 positions shown; findings below may reference images not displayed]

FINDINGS: ACR Breast Density Category c:  The breasts are heterogeneously
dense, which may obscure small masses.

There are no findings suspicious for malignancy.

Images were processed with CAD.
IMPRESSION: No mammographic evidence of malignancy.

A result letter of this screening mammogram will be mailed directly
to the patient.

RECOMMENDATION:
Screening mammogram in one year. (Code:[IJ])

BI-RADS CATEGORY 1:  Negative.

## 2012-11-02 DIAGNOSIS — Z9849 Cataract extraction status, unspecified eye: Secondary | ICD-10-CM | POA: Insufficient documentation

## 2013-01-07 DIAGNOSIS — H2511 Age-related nuclear cataract, right eye: Secondary | ICD-10-CM | POA: Insufficient documentation

## 2013-01-31 ENCOUNTER — Encounter (INDEPENDENT_AMBULATORY_CARE_PROVIDER_SITE_OTHER): Payer: Self-pay

## 2013-01-31 ENCOUNTER — Encounter: Payer: Self-pay | Admitting: Adult Health

## 2013-01-31 ENCOUNTER — Ambulatory Visit (INDEPENDENT_AMBULATORY_CARE_PROVIDER_SITE_OTHER): Payer: 59 | Admitting: Adult Health

## 2013-01-31 VITALS — BP 158/88 | HR 72 | Ht 66.5 in | Wt 164.5 lb

## 2013-01-31 DIAGNOSIS — Z01419 Encounter for gynecological examination (general) (routine) without abnormal findings: Secondary | ICD-10-CM

## 2013-01-31 DIAGNOSIS — Z79818 Long term (current) use of other agents affecting estrogen receptors and estrogen levels: Secondary | ICD-10-CM | POA: Insufficient documentation

## 2013-01-31 DIAGNOSIS — R140 Abdominal distension (gaseous): Secondary | ICD-10-CM

## 2013-01-31 DIAGNOSIS — I4891 Unspecified atrial fibrillation: Secondary | ICD-10-CM

## 2013-01-31 DIAGNOSIS — I10 Essential (primary) hypertension: Secondary | ICD-10-CM

## 2013-01-31 DIAGNOSIS — Z79899 Other long term (current) drug therapy: Secondary | ICD-10-CM

## 2013-01-31 DIAGNOSIS — R110 Nausea: Secondary | ICD-10-CM

## 2013-01-31 DIAGNOSIS — Z1212 Encounter for screening for malignant neoplasm of rectum: Secondary | ICD-10-CM

## 2013-01-31 HISTORY — DX: Long term (current) use of other agents affecting estrogen receptors and estrogen levels: Z79.818

## 2013-01-31 HISTORY — DX: Other long term (current) drug therapy: Z79.899

## 2013-01-31 LAB — HEMOCCULT GUIAC POC 1CARD (OFFICE): Fecal Occult Blood, POC: POSITIVE

## 2013-01-31 MED ORDER — ESTRADIOL 0.1 MG/24HR TD PTTW: 1.0000 | MEDICATED_PATCH | TRANSDERMAL | Status: DC

## 2013-01-31 NOTE — Progress Notes (Signed)
Patient ID: Renee Benitez, female   DOB: 1951/01/31, 62 y.o.   MRN: [id number] History of Present Illness: Renee Benitez is a 62 year old white female married in for her physical.Has had some waves of nausea, RUQ pain at times,bloating and has seen blood in stool.Has some constipation at times.She has a-fib. She is still using vivelle dot and wants refills.She got flu shot at work, and she is Charity fundraiser at [employer].  Current Medications, Allergies, Past Medical History, Past Surgical History, Family History and Social History were reviewed in [employer] record.     Review of Systems: Patient denies any headaches, blurred vision, shortness of breath, chest pain, problems with urination, or intercourse. No joint pain or mood changes.Positives in HPI.    Physical Exam:BP 158/88  Pulse 72  Ht 5' 6.5" (1.689 m)  Wt 164 lb 8 oz (74.617 kg)  BMI 26.16 kg/m2 General:  Well developed, well nourished, no acute distress Skin:  Warm and dry Neck:  Midline trachea, normal thyroid Lungs; Clear to auscultation bilaterally Breast:  No dominant palpable mass, retraction, or nipple discharge Cardiovascular: Regular rate and irregular rhythm at times Abdomen:  Soft, non tender, no hepatosplenomegaly Pelvic:  External genitalia is normal in appearance.  The vagina is normal in appearance for age.               The cervix  And uterus are absent.  No adnexal masses or tenderness noted. Rectal: Good sphincter tone, no polyps, or hemorrhoids felt.  Hemoccult positive. Extremities:  No swelling or varicosities noted Psych:  No mood changes,alert and cooperative   Impression: Yearly gyn exam ET A-fib Nausea  Bloating Hypertension +hemoccult  Plan: Call Dr [name] and schedule colonoscopy ASAP Get abdominal and pelvic US 10/28 at 8 am at [facility], will talk after results back Physical in 1 year Mammogram yearly Labs with PCP Check BP at work

## 2013-01-31 NOTE — Patient Instructions (Signed)
Physical in 1 year Mammogram yearly Colonoscopy now Labs with PCP

## 2013-02-06 ENCOUNTER — Telehealth: Payer: Self-pay | Admitting: Adult Health

## 2013-02-06 ENCOUNTER — Ambulatory Visit (HOSPITAL_COMMUNITY)
Admission: RE | Admit: 2013-02-06 | Discharge: 2013-02-06 | Disposition: A | Discharge status: Home / Self Care | Payer: 59 | Source: Ambulatory Visit | Attending: Adult Health | Admitting: Adult Health

## 2013-02-06 ENCOUNTER — Ambulatory Visit (HOSPITAL_COMMUNITY): Payer: 59

## 2013-02-06 ENCOUNTER — Other Ambulatory Visit: Payer: 59

## 2013-02-06 DIAGNOSIS — N83209 Unspecified ovarian cyst, unspecified side: Secondary | ICD-10-CM | POA: Insufficient documentation

## 2013-02-06 DIAGNOSIS — R1011 Right upper quadrant pain: Secondary | ICD-10-CM | POA: Insufficient documentation

## 2013-02-06 DIAGNOSIS — R627 Adult failure to thrive: Secondary | ICD-10-CM | POA: Insufficient documentation

## 2013-02-06 DIAGNOSIS — R140 Abdominal distension (gaseous): Secondary | ICD-10-CM

## 2013-02-06 IMAGING — US US ABDOMEN LIMITED
1 series · 14 of 25 positions shown · non-contrast
Comparison: None.

CLINICAL DATA: Right upper quadrant pain

EXAM:
US ABDOMEN LIMITED - RIGHT UPPER QUADRANT

[Series 1: us abdomen limited · 0.21mm/px · 14 of 58 slices shown]
[im 1/58]
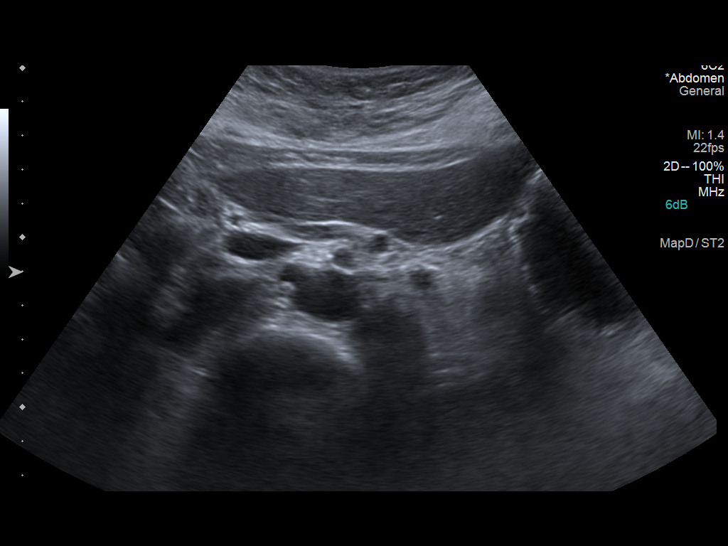
[im 5/58]
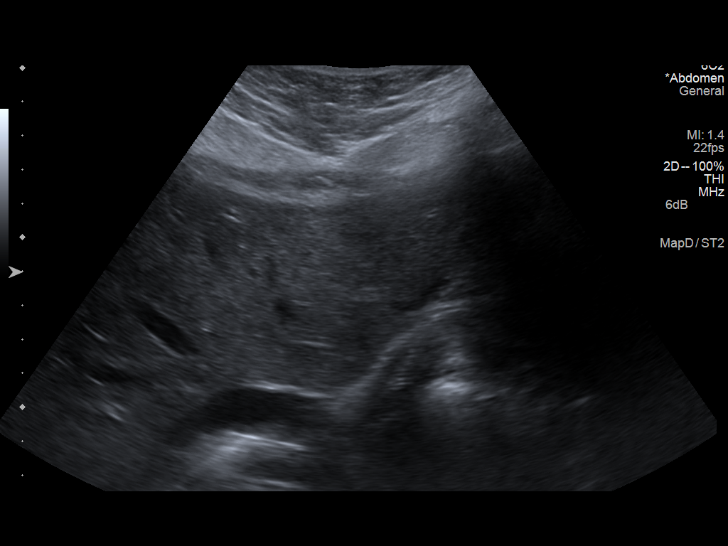
[im 10/58]
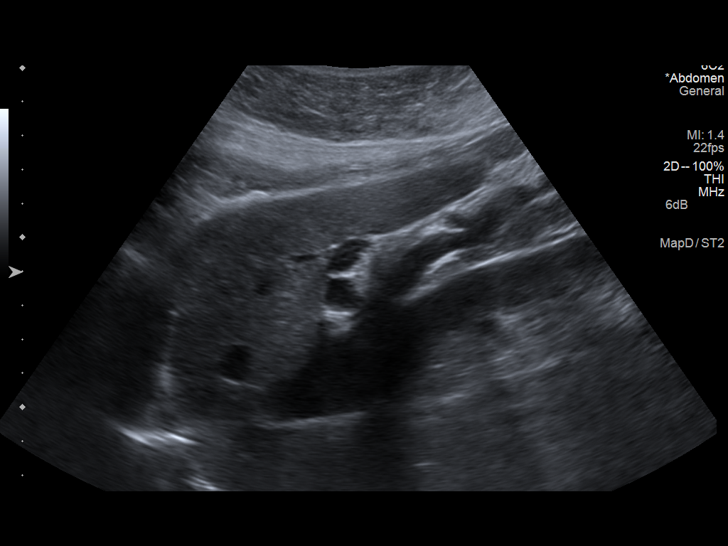
[im 15/58]
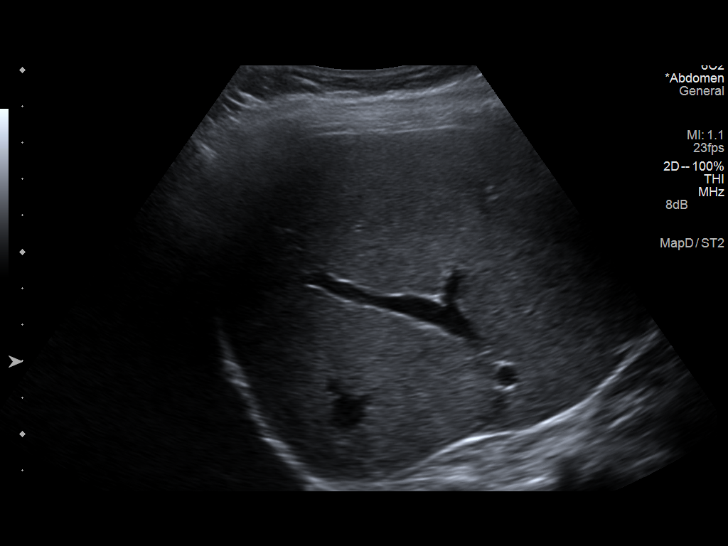
[im 20/58]
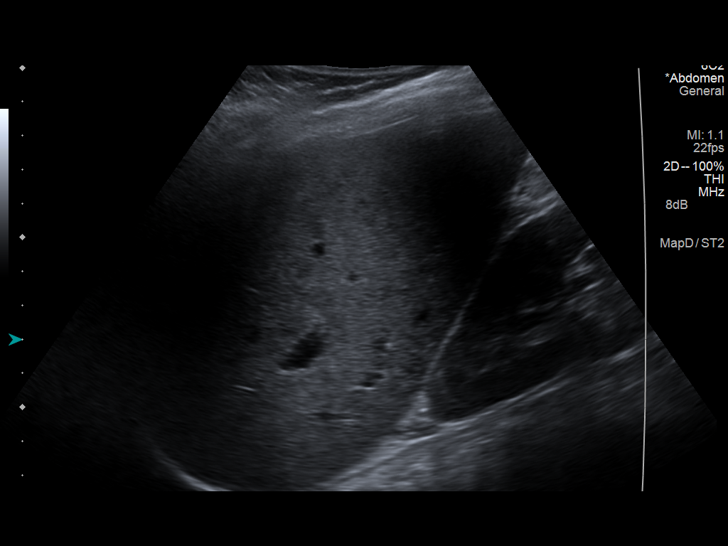
[im 22/58]
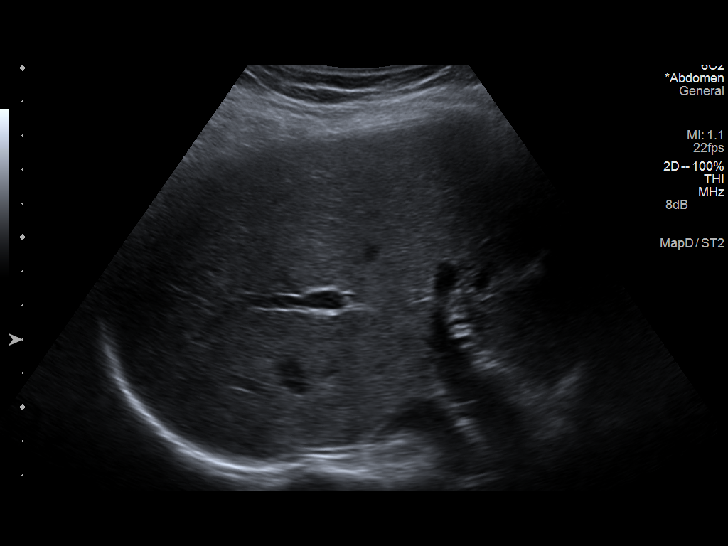
[im 27/58]
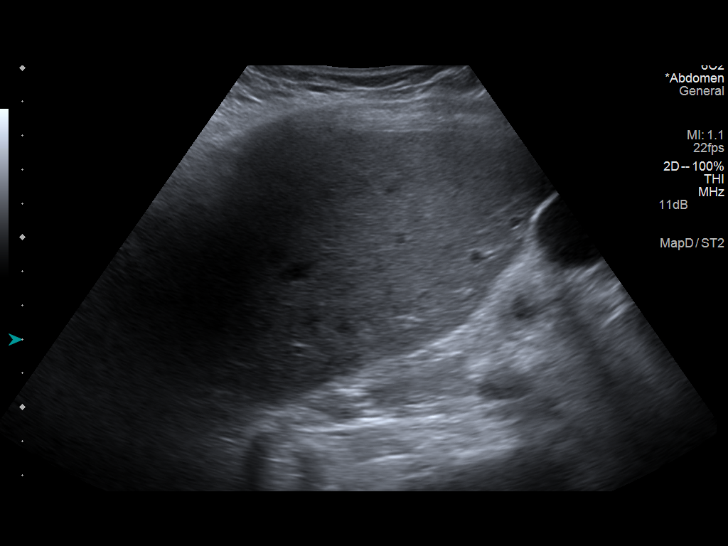
[im 31/58]
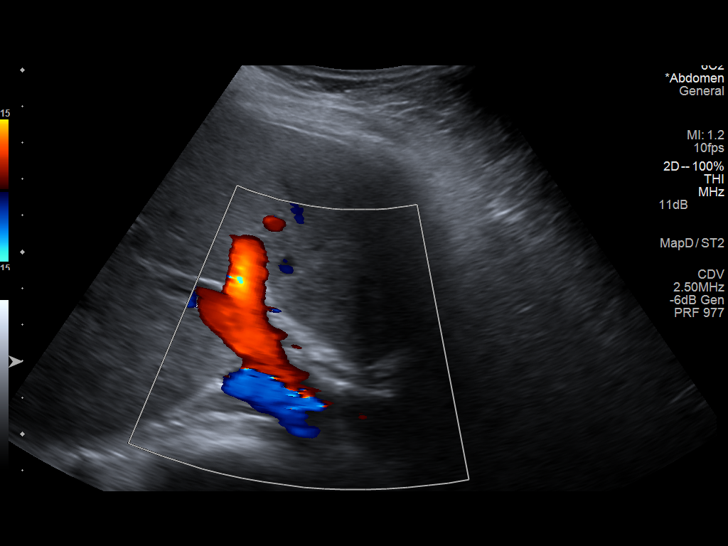
[im 36/58]
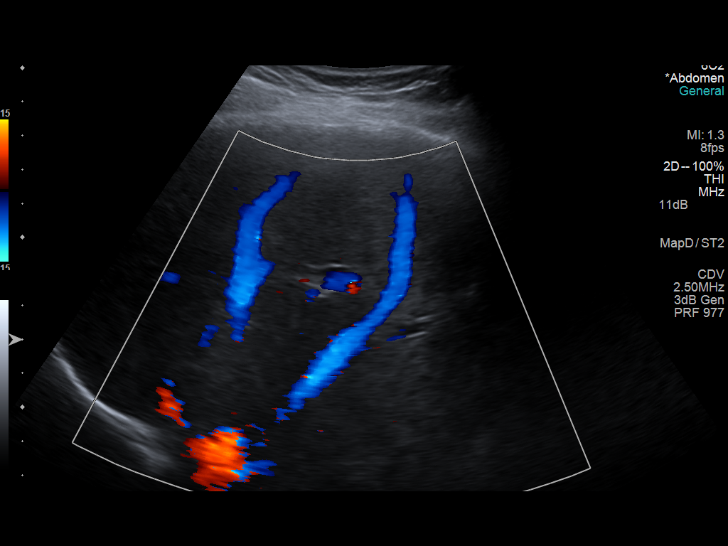
[im 39/58]
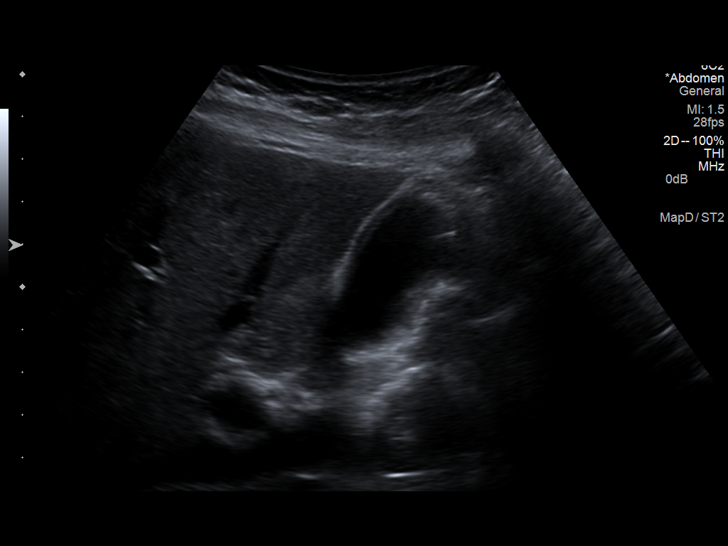
[im 43/58]
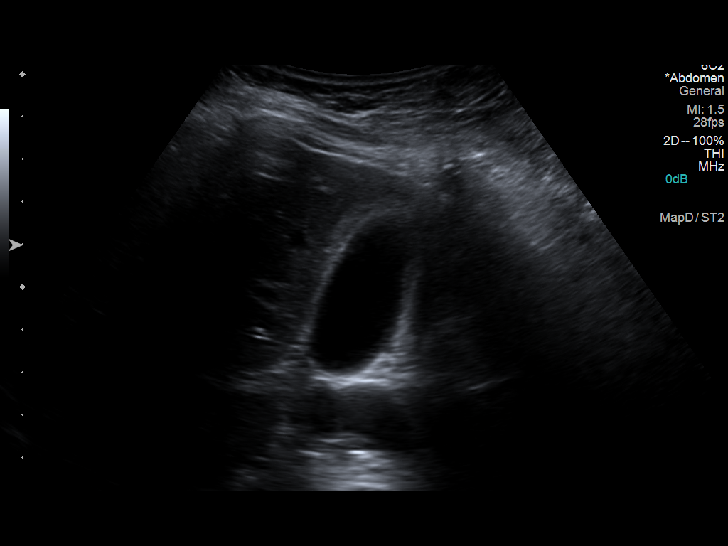
[im 48/58]
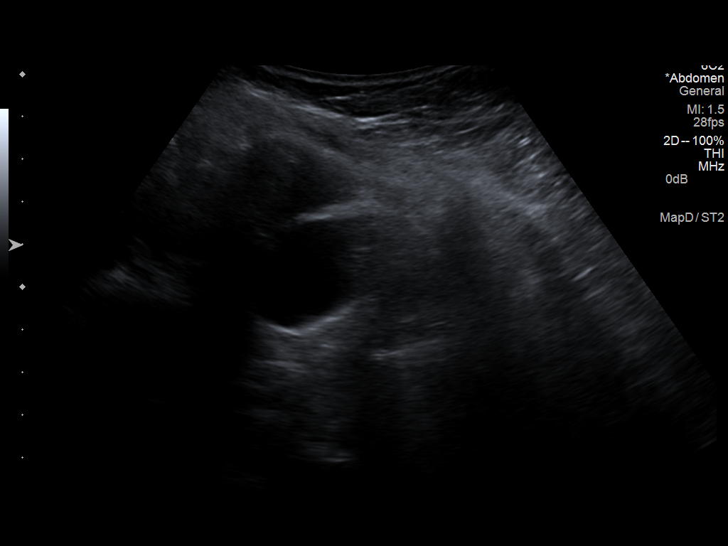
[im 53/58]
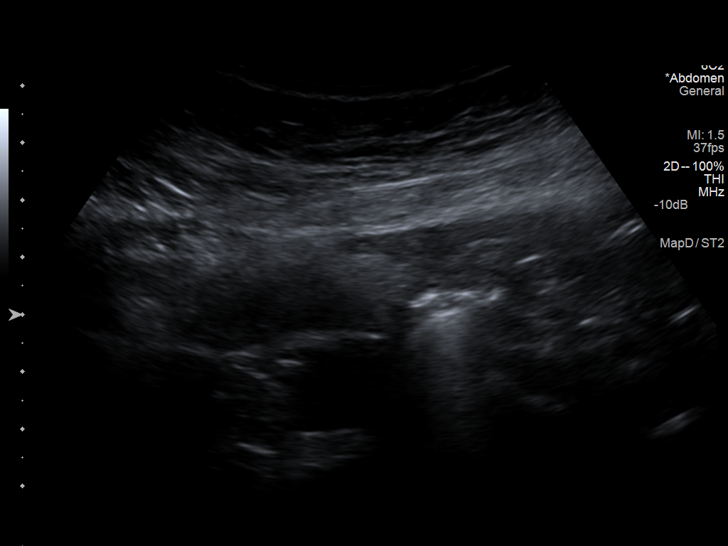
[im 58/58]
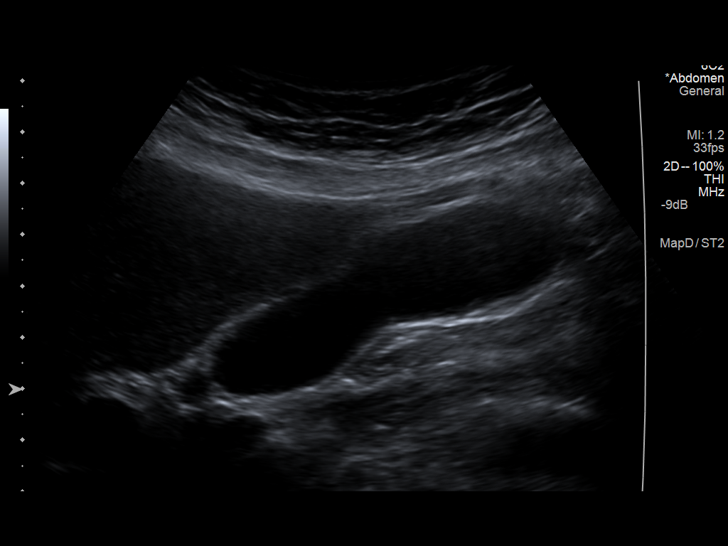

[14 of 25 positions shown; findings below may reference images not displayed]

FINDINGS: Gallbladder

No gallstones or wall thickening visualized. No sonographic Murphy
sign noted.

Common bile duct

Diameter: 3.3 mm

Liver:

No focal lesion identified. Within normal limits in parenchymal
echogenicity.
IMPRESSION: Normal exam.

## 2013-02-06 NOTE — Telephone Encounter (Signed)
[employer] breaking up she is to call me back.

## 2013-02-06 NOTE — Telephone Encounter (Signed)
Pt aware of US results. 

## 2013-02-07 ENCOUNTER — Telehealth: Payer: Self-pay | Admitting: Adult Health

## 2013-02-07 NOTE — Telephone Encounter (Signed)
Pt aware labs normal, will get F/U US in 1 year.

## 2013-02-08 ENCOUNTER — Telehealth: Payer: Self-pay | Admitting: Internal Medicine

## 2013-02-08 NOTE — Telephone Encounter (Signed)
Pt called this afternoon to schedule her tcs that is about 2 years past due. She said that Cyril Mourning at [facility] told her that she had blood in her stools and needed to follow up on getting her tcs. I told her that we would need to bring her in the office first but she wants to know which days would be available for her tcs before scheduling her OV first due to her work schedule and getting in within 30 days of OV. I told her that LAW had already left for the day and would be back in the morning at 8am. Pt asked if LAW would call her back around 9am or either she would call back one. I told her either way was fine and I would let LAW know to be expecting her call. [phone]

## 2013-02-09 NOTE — Telephone Encounter (Signed)
Informed patient of dates and times

## 2013-02-12 ENCOUNTER — Other Ambulatory Visit: Payer: Self-pay | Admitting: Cardiology

## 2013-02-14 ENCOUNTER — Telehealth: Payer: Self-pay | Admitting: *Deleted

## 2013-02-14 MED ORDER — DILTIAZEM HCL ER COATED BEADS 120 MG PO CP24: ORAL_CAPSULE | ORAL | Status: DC

## 2013-02-14 NOTE — Telephone Encounter (Signed)
Diltiazem needs refilled with [facility]. Pt states that pharmacy sent her e-mail saying we were not going to fill it/tmj

## 2013-02-14 NOTE — Telephone Encounter (Signed)
Medication sent via escribe.  

## 2013-02-26 ENCOUNTER — Encounter: Payer: Self-pay | Admitting: Gastroenterology

## 2013-02-26 ENCOUNTER — Ambulatory Visit (INDEPENDENT_AMBULATORY_CARE_PROVIDER_SITE_OTHER): Payer: 59 | Admitting: Gastroenterology

## 2013-02-26 VITALS — BP 129/73 | HR 70 | Temp 98.2°F | Ht 67.0 in | Wt 167.6 lb

## 2013-02-26 DIAGNOSIS — R195 Other fecal abnormalities: Secondary | ICD-10-CM | POA: Insufficient documentation

## 2013-02-26 MED ORDER — PEG-KCL-NACL-NASULF-NA ASC-C 100 G PO SOLR: 1.0000 | ORAL | Status: DC

## 2013-02-26 NOTE — Assessment & Plan Note (Signed)
62 year old female with heme positive stool and historical low-volume hematochezia, with last colonoscopy in 2005. Overall no other concerning lower or upper GI symptoms. Likely benign anorectal source but recommend colonoscopy as screening maneuver.   Proceed with TCS with Dr. [name] in near future: the risks, benefits, and alternatives have been discussed with the patient in detail. The patient states understanding and desires to proceed.

## 2013-02-26 NOTE — Patient Instructions (Signed)
We have scheduled you for a colonoscopy with Dr. [name] in the near future.  Further recommendations to follow.  Have a wonderful Thanksgiving!

## 2013-02-26 NOTE — Progress Notes (Signed)
Referring Provider: Milana Obey, MD Primary Care Physician:  Milana Obey, MD Primary GI: Dr. [name]   Chief Complaint  Patient presents with  . POSITIVE HEMOCCULT    HPI:   Renee Benitez presents today at the request of Cyril Mourning, NP, due to need for colonoscopy. Heme positive stool noted. She was last seen by myself in Dec 2012 due to rectal bleeding. She was scheduled for a colonoscopy at that time, but this was cancelled due to conflicting schedules with patient.   Occasional low-volume hematochezia. Thinks hemorrhoid related. Will get up in the morning and have a BM, thinks she is finished, then 15-20 minutes later has to go again. Normal baseline for patient. Fiber tears stomach up, almost causes diarrhea. Occasional lower back discomfort. Had some nausea with the pain but now improved. Normal GYN ultrasound. No lack of appetite. No reflux or dysphagia.   Past Medical History  Diagnosis Date  . Atrial fibrillation     Paroxysmal, CHADS2 score 1  . Hypertension   . Menopausal syndrome   . Current use of estrogen therapy 01/31/2013    Past Surgical History  Procedure Laterality Date  . Appendectomy    . Abdominal hysterectomy    . Colonoscopy  12/04/03    Friable anal canal otherwise normal rectum and colon  . Tonsilectomy, adenoidectomy, bilateral myringotomy and tubes    . Cataract extraction w/ intraocular lens  implant, bilateral      Current Outpatient Prescriptions  Medication Sig Dispense Refill  . aspirin 81 MG tablet Take 81 mg by mouth daily.        . calcium carbonate (OS-CAL) 600 MG TABS Take 600 mg by mouth daily.        Marland Kitchen diltiazem (CARDIZEM CD) 120 MG 24 hr capsule TAKE 1 CAPSULE DAILY  90 capsule  0  . diltiazem (CARDIZEM) 30 MG tablet Take 1 tablet (30 mg total) by mouth every 6 (six) hours as needed. Take 1 to 2 tablets every 6 hrs. as needed for palpitations  30 tablet  6  . ergocalciferol (VITAMIN D2) 50000 UNITS capsule Take  50,000 Units by mouth once a week.        . estradiol (VIVELLE-DOT) 0.1 MG/24HR patch Place 1 patch (0.1 mg total) onto the skin 2 (two) times a week.  24 patch  prn  . Multiple Vitamins-Minerals (MULTIVITAMIN WITH MINERALS) tablet Take 1 tablet by mouth daily.        . Multiple Vitamins-Minerals (PRESERVISION AREDS 2) CAPS Take 1 capsule by mouth 2 (two) times daily.       No current facility-administered medications for this visit.    Allergies as of 02/26/2013  . (No Known Allergies)    Family History  Problem Relation Age of Onset  . Coronary artery disease Father   . Cancer Father     bladder cancer  . Arrhythmia Sister     Reportedly had ablation of SVT  . Colon cancer Maternal Grandmother     History   Social History  . Marital Status: Married    Spouse Name: N/A    Number of Children: N/A  . Years of Education: N/A   Occupational History  . Nurse     [employer]   Social History Main Topics  . Smoking status: Never Smoker   . Smokeless tobacco: Never Used     Comment: Never smoked  . Alcohol Use: Yes     Comment: Rarely  .  Drug Use: No  . Sexual Activity: Yes    Birth Control/ Protection: Surgical   Other Topics Concern  . None   Social History Narrative  . None    Review of Systems: As mentioned in HPI.   Physical Exam: BP 129/73  Pulse 70  Temp(Src) 98.2 F (36.8 C)  Ht 5\' 7"  (1.702 m)  Wt 167 lb 9.6 oz (76.023 kg)  BMI 26.24 kg/m2 General:   Alert and oriented. No distress noted. Pleasant and cooperative.  Head:  Normocephalic and atraumatic. Eyes:  Conjuctiva clear without scleral icterus. Mouth:  Oral mucosa pink and moist. Good dentition. No lesions. Neck:  Supple, without mass or thyromegaly. Heart:  S1, S2 present without murmurs, rubs, or gallops. Regular rate and rhythm. Abdomen:  +BS, soft, non-tender and non-distended. No rebound or guarding. No HSM or masses noted. Msk:  Symmetrical without gross deformities. Normal  posture. Extremities:  Without edema. Neurologic:  Alert and  oriented x4;  grossly normal neurologically. Skin:  Intact without significant lesions or rashes. Cervical Nodes:  No significant cervical adenopathy. Psych:  Alert and cooperative. Normal mood and affect.

## 2013-02-27 NOTE — Progress Notes (Signed)
Cc PCP 

## 2013-03-07 ENCOUNTER — Encounter (HOSPITAL_COMMUNITY): Payer: Self-pay

## 2013-03-13 ENCOUNTER — Ambulatory Visit: Payer: 59 | Admitting: Cardiology

## 2013-03-13 ENCOUNTER — Other Ambulatory Visit (HOSPITAL_COMMUNITY): Payer: Self-pay | Admitting: Family Medicine

## 2013-03-13 DIAGNOSIS — M8580 Other specified disorders of bone density and structure, unspecified site: Secondary | ICD-10-CM

## 2013-03-15 ENCOUNTER — Other Ambulatory Visit (HOSPITAL_COMMUNITY): Payer: 59

## 2013-03-16 ENCOUNTER — Encounter: Payer: Self-pay | Admitting: Cardiology

## 2013-03-16 ENCOUNTER — Encounter: Payer: Self-pay | Admitting: *Deleted

## 2013-03-16 ENCOUNTER — Ambulatory Visit (INDEPENDENT_AMBULATORY_CARE_PROVIDER_SITE_OTHER): Payer: 59 | Admitting: Cardiology

## 2013-03-16 VITALS — BP 136/69 | HR 76 | Ht 66.0 in | Wt 163.0 lb

## 2013-03-16 DIAGNOSIS — I4891 Unspecified atrial fibrillation: Secondary | ICD-10-CM

## 2013-03-16 DIAGNOSIS — R0602 Shortness of breath: Secondary | ICD-10-CM

## 2013-03-16 DIAGNOSIS — I10 Essential (primary) hypertension: Secondary | ICD-10-CM

## 2013-03-16 NOTE — Assessment & Plan Note (Signed)
No change to current regimen. Keep followup with Dr. Sudie Bailey.

## 2013-03-16 NOTE — Patient Instructions (Addendum)
Your physician wants you to follow-up in:  ONE YEAR You will receive a reminder letter in the mail two months in advance. If you don't receive a letter, please call our office to schedule the follow-up appointment.  Your physician has requested that you have a stress echocardiogram. For further information please visit [email or web address] Please follow instruction sheet as given.  WE WILL CALL YOU WITH YOUR TEST RESULTS/INSTRUCTIONS/NEXT STEPS ONCE RECEIVED BY THE PROVIDER

## 2013-03-16 NOTE — Assessment & Plan Note (Signed)
For now we will continue aspirin and Cardizem as before. She will be mindful for pattern and frequency of her PAF, we can always consider antiarrhythmic therapy if things progress. In the short-term we will obtain an exercise echocardiogram particularly in light of her reported exertional shortness of breath to exclude ischemic or significant structural heart disease. This may also help to guide antiarrhythmic therapy if needed. Followup arranged. She will see [place] back sooner if symptoms progress.

## 2013-03-16 NOTE — Progress Notes (Signed)
Clinical Summary Renee Benitez is a 62 y.o.female last seen in November 2013. She does have episodic palpitations consistent with PAF, some have been more intense, but still resolve after taking Cardizem 30 mg tablet in addition to her long acting dose. There does not appear to be any pattern to this.  She has noticed some exertional fatigue and shortness of breath on walking inclines or stairs, no exertional chest pain. ECG reviewed today shows normal sinus rhythm.  Today we discussed management of her atrial fibrillation. She still prefers to stay on aspirin, although we have discussed merits of anticoagulant therapy, her CHADSVASC score is 2, still relatively low thromboembolic risk overall. We also reviewed consideration for antiarrhythmic therapy if she is having escalating symptoms. Flecainide might be a good option.   No Known Allergies  Current Outpatient Prescriptions  Medication Sig Dispense Refill  . aspirin 81 MG tablet Take 81 mg by mouth daily.        . calcium carbonate (OS-CAL) 600 MG TABS Take 600 mg by mouth daily.        Marland Kitchen diltiazem (CARDIZEM CD) 120 MG 24 hr capsule TAKE 1 CAPSULE DAILY  90 capsule  0  . diltiazem (CARDIZEM) 30 MG tablet Take 1 tablet (30 mg total) by mouth every 6 (six) hours as needed. Take 1 to 2 tablets every 6 hrs. as needed for palpitations  30 tablet  6  . ergocalciferol (VITAMIN D2) 50000 UNITS capsule Take 50,000 Units by mouth once a week.        . estradiol (VIVELLE-DOT) 0.1 MG/24HR patch Place 1 patch (0.1 mg total) onto the skin 2 (two) times a week.  24 patch  prn  . Multiple Vitamins-Minerals (MULTIVITAMIN WITH MINERALS) tablet Take 1 tablet by mouth daily.        . Multiple Vitamins-Minerals (PRESERVISION AREDS 2) CAPS Take 1 capsule by mouth 2 (two) times daily.       No current facility-administered medications for this visit.    Past Medical History  Diagnosis Date  . Atrial fibrillation     Paroxysmal, CHADS2 score 1  .  Hypertension   . Menopausal syndrome   . Current use of estrogen therapy 01/31/2013    Social History Renee Benitez reports that she has never smoked. She has never used smokeless tobacco. Renee Benitez reports that she drinks alcohol.  Review of Systems Negative except as outlined.  Physical Examination Filed Vitals:   03/16/13 1312  BP: 136/69  Pulse: 76   Filed Weights   03/16/13 1312  Weight: 163 lb (73.936 kg)    Normally nourished appearing woman, appears comfortable at rest.  HEENT: Conjunctiva and lids normal, oropharynx clear.  Neck: Supple, no elevated JVP or bruits.  Lungs: Clear to auscultation, nontender.  Cardiac: Regular rate and rhythm, no mid systolic click or S3.  Abdomen: Soft, nontender, bowel sounds present.  Skin: Warm and dry. Musculoskeletal: No kyphosis. Neuropsychiatric: Alert and oriented x3, affect appropriate.   Problem List and Plan   Atrial fibrillation For now we will continue aspirin and Cardizem as before. She will be mindful for pattern and frequency of her PAF, we can always consider antiarrhythmic therapy if things progress. In the short-term we will obtain an exercise echocardiogram particularly in light of her reported exertional shortness of breath to exclude ischemic or significant structural heart disease. This may also help to guide antiarrhythmic therapy if needed. Followup arranged. She will see [place] back sooner if symptoms progress.  Essential hypertension, benign No change to current regimen. Keep followup with Dr. Sudie Bailey.    [name], M.D., F.A.C.C.

## 2013-03-19 ENCOUNTER — Encounter (HOSPITAL_COMMUNITY): Payer: Self-pay | Admitting: *Deleted

## 2013-03-19 ENCOUNTER — Ambulatory Visit (HOSPITAL_COMMUNITY)
Admission: RE | Admit: 2013-03-19 | Discharge: 2013-03-19 | Disposition: A | Discharge status: Home / Self Care | Payer: 59 | Source: Ambulatory Visit | Attending: Internal Medicine | Admitting: Internal Medicine

## 2013-03-19 ENCOUNTER — Encounter (HOSPITAL_COMMUNITY)
Admission: RE | Disposition: A | Discharge status: Home / Self Care | Payer: Self-pay | Source: Ambulatory Visit | Attending: Internal Medicine

## 2013-03-19 DIAGNOSIS — I10 Essential (primary) hypertension: Secondary | ICD-10-CM | POA: Insufficient documentation

## 2013-03-19 DIAGNOSIS — K921 Melena: Secondary | ICD-10-CM

## 2013-03-19 DIAGNOSIS — R195 Other fecal abnormalities: Secondary | ICD-10-CM

## 2013-03-19 DIAGNOSIS — K5730 Diverticulosis of large intestine without perforation or abscess without bleeding: Secondary | ICD-10-CM

## 2013-03-19 DIAGNOSIS — K648 Other hemorrhoids: Secondary | ICD-10-CM

## 2013-03-19 DIAGNOSIS — K644 Residual hemorrhoidal skin tags: Secondary | ICD-10-CM | POA: Insufficient documentation

## 2013-03-19 HISTORY — PX: COLONOSCOPY: SHX5424

## 2013-03-19 SURGERY — COLONOSCOPY
Anesthesia: Moderate Sedation

## 2013-03-19 MED ORDER — ONDANSETRON HCL 4 MG/2ML IJ SOLN
INTRAMUSCULAR | Status: AC
  Filled 2013-03-19: qty 2

## 2013-03-19 MED ORDER — MIDAZOLAM HCL 5 MG/5ML IJ SOLN
INTRAMUSCULAR | Status: AC
  Filled 2013-03-19: qty 10

## 2013-03-19 MED ORDER — ONDANSETRON HCL 4 MG/2ML IJ SOLN
INTRAMUSCULAR | Status: DC | PRN
  Administered 2013-03-19: 4 mg via INTRAMUSCULAR

## 2013-03-19 MED ORDER — MEPERIDINE HCL 100 MG/ML IJ SOLN
INTRAMUSCULAR | Status: DC | PRN
  Administered 2013-03-19 (×2): 25 mg via INTRAVENOUS
  Administered 2013-03-19: 50 mg via INTRAVENOUS

## 2013-03-19 MED ORDER — SODIUM CHLORIDE 0.9 % IV SOLN
INTRAVENOUS | Status: DC
  Administered 2013-03-19: 13:00:00 via INTRAVENOUS

## 2013-03-19 MED ORDER — MEPERIDINE HCL 100 MG/ML IJ SOLN
INTRAMUSCULAR | Status: AC
  Filled 2013-03-19: qty 2

## 2013-03-19 MED ORDER — MEPERIDINE HCL 100 MG/ML IJ SOLN
INTRAMUSCULAR | Status: AC
  Filled 2013-03-19: qty 1

## 2013-03-19 MED ORDER — MIDAZOLAM HCL 5 MG/5ML IJ SOLN
INTRAMUSCULAR | Status: DC | PRN
  Administered 2013-03-19: 1 mg via INTRAVENOUS
  Administered 2013-03-19: 2 mg via INTRAVENOUS
  Administered 2013-03-19: 1 mg via INTRAVENOUS
  Administered 2013-03-19: 2 mg via INTRAVENOUS

## 2013-03-19 MED ORDER — SIMETHICONE 40 MG/0.6ML PO SUSP
ORAL | Status: DC | PRN
  Administered 2013-03-19: 13:00:00

## 2013-03-19 NOTE — H&P (View-Only) (Signed)
 Referring Provider: Knowlton, Stephen D, MD Primary Care Physician:  KNOWLTON,STEPHEN D, MD Primary GI: Dr. Rourk   Chief Complaint  Patient presents with  . POSITIVE HEMOCCULT    HPI:   Renee Benitez presents today at the request of Jennifer Griffin, NP, due to need for colonoscopy. Heme positive stool noted. She was last seen by myself in Dec 2012 due to rectal bleeding. She was scheduled for a colonoscopy at that time, but this was cancelled due to conflicting schedules with patient.   Occasional low-volume hematochezia. Thinks hemorrhoid related. Will get up in the morning and have a BM, thinks she is finished, then 15-20 minutes later has to go again. Normal baseline for patient. Fiber tears stomach up, almost causes diarrhea. Occasional lower back discomfort. Had some nausea with the pain but now improved. Normal GYN ultrasound. No lack of appetite. No reflux or dysphagia.   Past Medical History  Diagnosis Date  . Atrial fibrillation     Paroxysmal, CHADS2 score 1  . Hypertension   . Menopausal syndrome   . Current use of estrogen therapy 01/31/2013    Past Surgical History  Procedure Laterality Date  . Appendectomy    . Abdominal hysterectomy    . Colonoscopy  12/04/03    Friable anal canal otherwise normal rectum and colon  . Tonsilectomy, adenoidectomy, bilateral myringotomy and tubes    . Cataract extraction w/ intraocular lens  implant, bilateral      Current Outpatient Prescriptions  Medication Sig Dispense Refill  . aspirin 81 MG tablet Take 81 mg by mouth daily.        . calcium carbonate (OS-CAL) 600 MG TABS Take 600 mg by mouth daily.        . diltiazem (CARDIZEM CD) 120 MG 24 hr capsule TAKE 1 CAPSULE DAILY  90 capsule  0  . diltiazem (CARDIZEM) 30 MG tablet Take 1 tablet (30 mg total) by mouth every 6 (six) hours as needed. Take 1 to 2 tablets every 6 hrs. as needed for palpitations  30 tablet  6  . ergocalciferol (VITAMIN D2) 50000 UNITS capsule Take  50,000 Units by mouth once a week.        . estradiol (VIVELLE-DOT) 0.1 MG/24HR patch Place 1 patch (0.1 mg total) onto the skin 2 (two) times a week.  24 patch  prn  . Multiple Vitamins-Minerals (MULTIVITAMIN WITH MINERALS) tablet Take 1 tablet by mouth daily.        . Multiple Vitamins-Minerals (PRESERVISION AREDS 2) CAPS Take 1 capsule by mouth 2 (two) times daily.       No current facility-administered medications for this visit.    Allergies as of 02/26/2013  . (No Known Allergies)    Family History  Problem Relation Age of Onset  . Coronary artery disease Father   . Cancer Father     bladder cancer  . Arrhythmia Sister     Reportedly had ablation of SVT  . Colon cancer Maternal Grandmother     History   Social History  . Marital Status: Married    Spouse Name: N/A    Number of Children: N/A  . Years of Education: N/A   Occupational History  . Nurse     [employer]   Social History Main Topics  . Smoking status: Never Smoker   . Smokeless tobacco: Never Used     Comment: Never smoked  . Alcohol Use: Yes     Comment: Rarely  .   Drug Use: No  . Sexual Activity: Yes    Birth Control/ Protection: Surgical   Other Topics Concern  . None   Social History Narrative  . None    Review of Systems: As mentioned in HPI.   Physical Exam: BP 129/73  Pulse 70  Temp(Src) 98.2 F (36.8 C)  Ht 5' 7" (1.702 m)  Wt 167 lb 9.6 oz (76.023 kg)  BMI 26.24 kg/m2 General:   Alert and oriented. No distress noted. Pleasant and cooperative.  Head:  Normocephalic and atraumatic. Eyes:  Conjuctiva clear without scleral icterus. Mouth:  Oral mucosa pink and moist. Good dentition. No lesions. Neck:  Supple, without mass or thyromegaly. Heart:  S1, S2 present without murmurs, rubs, or gallops. Regular rate and rhythm. Abdomen:  +BS, soft, non-tender and non-distended. No rebound or guarding. No HSM or masses noted. Msk:  Symmetrical without gross deformities. Normal  posture. Extremities:  Without edema. Neurologic:  Alert and  oriented x4;  grossly normal neurologically. Skin:  Intact without significant lesions or rashes. Cervical Nodes:  No significant cervical adenopathy. Psych:  Alert and cooperative. Normal mood and affect.  

## 2013-03-19 NOTE — Op Note (Signed)
[facility] [address] [place], [address]   COLONOSCOPY PROCEDURE REPORT  PATIENT: Renee Benitez, Renee Benitez  MR#:         [id number] BIRTHDATE: [birth year] , 62  yrs. old GENDER: Female ENDOSCOPIST: Jonathon Bellows, MD FACP [facility] REFERRED BY:  Cyril Mourning PROCEDURE DATE:  03/19/2013 PROCEDURE:     Diagnostic colonoscopy  INDICATIONS: Hemoccult-positive stool; lifelong paper hematochezia; patient describes grade 2 hemorrhoids. Occasional itching and discharge.  INFORMED CONSENT:  The risks, benefits, alternatives and imponderables including but not limited to bleeding, perforation as well as the possibility of a missed lesion have been reviewed.  The potential for biopsy, lesion removal, etc. have also been discussed.  Questions have been answered.  All parties agreeable. Please see the history and physical in the medical record for more information.  MEDICATIONS: Versed 6 mg IV and Demerol 100 mg IV in divided doses. Zofran 4 mg IV.  DESCRIPTION OF PROCEDURE:  After a digital rectal exam was performed, the EC-[id number] ([id number])  colonoscope was advanced from the anus through the rectum and colon to the area of the cecum, ileocecal valve and appendiceal orifice.  The cecum was deeply intubated.  These structures were well-seen and photographed for the record.  From the level of the cecum and ileocecal valve, the scope was slowly and cautiously withdrawn.  The mucosal surfaces were carefully surveyed utilizing scope tip deflection to facilitate fold flattening as needed.  The scope was pulled down into the rectum where a thorough examination including retroflexion was performed.    FINDINGS:  Adequate preparation. Somewhat friable anal canal hemorrhoids; otherwise, normal rectum. A few, scattered sigmoid diverticula; the remainder of the colonic mucosa appeared normal.  THERAPEUTIC / DIAGNOSTIC MANEUVERS PERFORMED:  None  COMPLICATIONS: None  CECAL  WITHDRAWAL TIME:  13 minutes  IMPRESSION:  Colonic diverticulosis. Anal canal hemorrhoids-likely source of hematochezia  RECOMMENDATIONS:   Repeat screening colonoscopy in 10 years. Anusol suppositories. Patient may well benefit from in- office hemorrhoidal banding. She will let me know.   _______________________________ eSigned:  R. Roetta Sessions, MD FACP [facility] 03/19/2013 2:10 PM   CC:

## 2013-03-19 NOTE — Interval H&P Note (Signed)
History and Physical Interval Note:  03/19/2013 1:26 PM  Renee Benitez  has presented today for surgery, with the diagnosis of HEME POSITIVE STOOL  The various methods of treatment have been discussed with the patient and family. After consideration of risks, benefits and other options for treatment, the patient has consented to  Procedure(s) with comments: COLONOSCOPY (N/A) - 1:15 as a surgical intervention .  The patient's history has been reviewed, patient examined, no change in status, stable for surgery.  I have reviewed the patient's chart and labs.  Questions were answered to the patient's satisfaction.     No change. Colonoscopy per plan.The risks, benefits, limitations, alternatives and imponderables have been reviewed with the patient. Questions have been answered. All parties are agreeable.   Eula Listen

## 2013-03-20 ENCOUNTER — Other Ambulatory Visit: Payer: Self-pay | Admitting: *Deleted

## 2013-03-20 MED ORDER — DILTIAZEM HCL ER COATED BEADS 120 MG PO CP24: ORAL_CAPSULE | ORAL | Status: DC

## 2013-03-21 ENCOUNTER — Ambulatory Visit (HOSPITAL_COMMUNITY)
Admission: RE | Admit: 2013-03-21 | Discharge: 2013-03-21 | Disposition: A | Discharge status: Home / Self Care | Payer: 59 | Source: Ambulatory Visit | Attending: Family Medicine | Admitting: Family Medicine

## 2013-03-21 DIAGNOSIS — M899 Disorder of bone, unspecified: Secondary | ICD-10-CM | POA: Insufficient documentation

## 2013-03-21 DIAGNOSIS — Z780 Asymptomatic menopausal state: Secondary | ICD-10-CM | POA: Insufficient documentation

## 2013-03-21 DIAGNOSIS — M8580 Other specified disorders of bone density and structure, unspecified site: Secondary | ICD-10-CM

## 2013-03-22 ENCOUNTER — Encounter (HOSPITAL_COMMUNITY): Payer: Self-pay | Admitting: Internal Medicine

## 2013-03-28 ENCOUNTER — Encounter (HOSPITAL_COMMUNITY): Payer: Self-pay

## 2013-03-28 ENCOUNTER — Ambulatory Visit (HOSPITAL_COMMUNITY)
Admission: RE | Admit: 2013-03-28 | Discharge: 2013-03-28 | Disposition: A | Discharge status: Home / Self Care | Payer: 59 | Source: Ambulatory Visit | Attending: Cardiology | Admitting: Cardiology

## 2013-03-28 DIAGNOSIS — I10 Essential (primary) hypertension: Secondary | ICD-10-CM | POA: Insufficient documentation

## 2013-03-28 DIAGNOSIS — R0602 Shortness of breath: Secondary | ICD-10-CM

## 2013-03-28 DIAGNOSIS — R0609 Other forms of dyspnea: Secondary | ICD-10-CM | POA: Insufficient documentation

## 2013-03-28 DIAGNOSIS — R0989 Other specified symptoms and signs involving the circulatory and respiratory systems: Secondary | ICD-10-CM | POA: Insufficient documentation

## 2013-03-28 DIAGNOSIS — I4891 Unspecified atrial fibrillation: Secondary | ICD-10-CM | POA: Insufficient documentation

## 2013-03-28 NOTE — Progress Notes (Signed)
*  PRELIMINARY RESULTS* Echocardiogram Echocardiogram Stress Test has been performed.  Renee Benitez 03/28/2013, 12:38 PM

## 2013-03-28 NOTE — Progress Notes (Signed)
Stress Lab Nurses Notes - Renee Benitez  Renee Benitez 03/28/2013 Reason for doing test: Dyspnea and AFib Type of test: Stress Echo Nurse performing test: Parke Poisson, RN Nuclear Medicine Tech: Not Applicable Echo Tech: Veda Canning MD performing test: Dr. Rebecka Apley.Geni Bers PA Family MD: Dr. Sudie Bailey Test explained and consent signed: yes IV started: No IV started Symptoms: Fatigue  Treatment/Intervention: None Reason test stopped: reached target HR After recovery IV was: NA Patient to return to Nuc. Med at : NA Patient discharged: Home Patient's Condition upon discharge was: stable Comments: During test peak BP 202/78 & HR162.  Recovery was having PVC frequently, BP 132/86 & HR 82 . Symptoms resolved in recovery. Erskine Speed T

## 2013-11-01 ENCOUNTER — Other Ambulatory Visit: Payer: Self-pay | Admitting: Cardiology

## 2013-11-02 ENCOUNTER — Telehealth: Payer: Self-pay | Admitting: *Deleted

## 2013-11-02 MED ORDER — DILTIAZEM HCL ER COATED BEADS 120 MG PO CP24: 120.0000 mg | ORAL_CAPSULE | Freq: Every day | ORAL | Status: DC

## 2013-11-02 NOTE — Telephone Encounter (Signed)
[facility] ref # [id number]  cardizem 120 mg 1 cap daily #90 fax [phone]

## 2013-11-02 NOTE — Telephone Encounter (Signed)
Refill request complete 

## 2014-01-25 ENCOUNTER — Ambulatory Visit (INDEPENDENT_AMBULATORY_CARE_PROVIDER_SITE_OTHER): Payer: 59 | Admitting: Adult Health

## 2014-01-25 ENCOUNTER — Other Ambulatory Visit: Payer: Self-pay | Admitting: Adult Health

## 2014-01-25 ENCOUNTER — Encounter: Payer: Self-pay | Admitting: Adult Health

## 2014-01-25 VITALS — BP 142/72 | HR 72 | Ht 66.0 in | Wt 167.0 lb

## 2014-01-25 DIAGNOSIS — Z1212 Encounter for screening for malignant neoplasm of rectum: Secondary | ICD-10-CM

## 2014-01-25 DIAGNOSIS — Z1231 Encounter for screening mammogram for malignant neoplasm of breast: Secondary | ICD-10-CM

## 2014-01-25 DIAGNOSIS — Z01419 Encounter for gynecological examination (general) (routine) without abnormal findings: Secondary | ICD-10-CM

## 2014-01-25 DIAGNOSIS — N83201 Unspecified ovarian cyst, right side: Secondary | ICD-10-CM | POA: Insufficient documentation

## 2014-01-25 DIAGNOSIS — Z79899 Other long term (current) drug therapy: Secondary | ICD-10-CM

## 2014-01-25 HISTORY — DX: Unspecified ovarian cyst, right side: N83.201

## 2014-01-25 LAB — HEMOCCULT GUIAC POC 1CARD (OFFICE): Fecal Occult Blood, POC: NEGATIVE

## 2014-01-25 MED ORDER — ESTRADIOL 0.1 MG/24HR TD PTTW: 1.0000 | MEDICATED_PATCH | TRANSDERMAL | Status: DC

## 2014-01-25 NOTE — Progress Notes (Signed)
Patient ID: Renee Benitez, female   DOB: [birth year], 63 y.o.   MRN: [id number] History of Present Illness: Renee Benitez is a 62 year old white female,married in for a gyn physical.Has history of right ovarian cyst.   Current Medications, Allergies, Past Medical History, Past Surgical History, Family History and Social History were reviewed in [employer] record.     Review of Systems: Patient denies any headaches, blurred vision, shortness of breath, chest pain, abdominal pain, problems with  urination, or intercourse. Has some constipation at times, no joint pain or mood swings. Some UI at times.   Physical Exam:BP 142/72  Pulse 72  Ht 5\' 6"  (1.676 m)  Wt 167 lb (75.751 kg)  BMI 26.97 kg/m2 General:  Well developed, well nourished, no acute distress Skin:  Warm and dry Neck:  Midline trachea, normal thyroid Lungs; Clear to auscultation bilaterally Breast:  No dominant palpable mass, retraction, or nipple discharge Cardiovascular: Regular rate and rhythm Abdomen:  Soft, non tender, no hepatosplenomegaly Pelvic:  External genitalia is normal in appearance for age.  The vagina has decrease color and rugae,moisture good.The cervix and uterus are absent.  No  adnexal masses or tenderness noted. Rectal: Good sphincter tone, no polyps,mild hemorrhoids felt.  Hemoccult negative.+rectocele Extremities:  No swelling or varicosities noted Psych:  No mood changes,alert and cooperative,seems happy, to retire next year. She got flu shot and shingles vaccine in September.   Impression: Well woman gyn exam no pap ET History of right ovarian cyst    Plan: Refilled vivelle dot x 1 year Mammogram yearly to get 10/22 Pelvic US 10/22 at [facility]  Physical in 1 year Colonoscopy per GI

## 2014-01-25 NOTE — Patient Instructions (Signed)
Physical in 1 year Mammogram now and yearly Colonoscopy per GI  

## 2014-01-29 ENCOUNTER — Other Ambulatory Visit: Payer: Self-pay | Admitting: Adult Health

## 2014-01-29 DIAGNOSIS — N83201 Unspecified ovarian cyst, right side: Secondary | ICD-10-CM

## 2014-01-31 ENCOUNTER — Ambulatory Visit (HOSPITAL_COMMUNITY): Payer: 59

## 2014-01-31 ENCOUNTER — Ambulatory Visit (HOSPITAL_COMMUNITY)
Admission: RE | Admit: 2014-01-31 | Discharge: 2014-01-31 | Disposition: A | Discharge status: Home / Self Care | Payer: 59 | Source: Ambulatory Visit | Attending: Adult Health | Admitting: Adult Health

## 2014-01-31 ENCOUNTER — Telehealth: Payer: Self-pay | Admitting: Adult Health

## 2014-01-31 DIAGNOSIS — Z90710 Acquired absence of both cervix and uterus: Secondary | ICD-10-CM | POA: Diagnosis not present

## 2014-01-31 DIAGNOSIS — N83201 Unspecified ovarian cyst, right side: Secondary | ICD-10-CM

## 2014-01-31 DIAGNOSIS — Z90721 Acquired absence of ovaries, unilateral: Secondary | ICD-10-CM | POA: Insufficient documentation

## 2014-01-31 DIAGNOSIS — Z1231 Encounter for screening mammogram for malignant neoplasm of breast: Secondary | ICD-10-CM | POA: Diagnosis present

## 2014-01-31 DIAGNOSIS — N8320 Unspecified ovarian cysts: Secondary | ICD-10-CM | POA: Diagnosis not present

## 2014-01-31 IMAGING — MG MM DIGITAL SCREENING
4 series · 4 of 4 positions shown · non-contrast
Comparison: Previous exam(s).

CLINICAL DATA: Screening.

EXAM:
DIGITAL SCREENING BILATERAL MAMMOGRAM WITH CAD

[L CC]
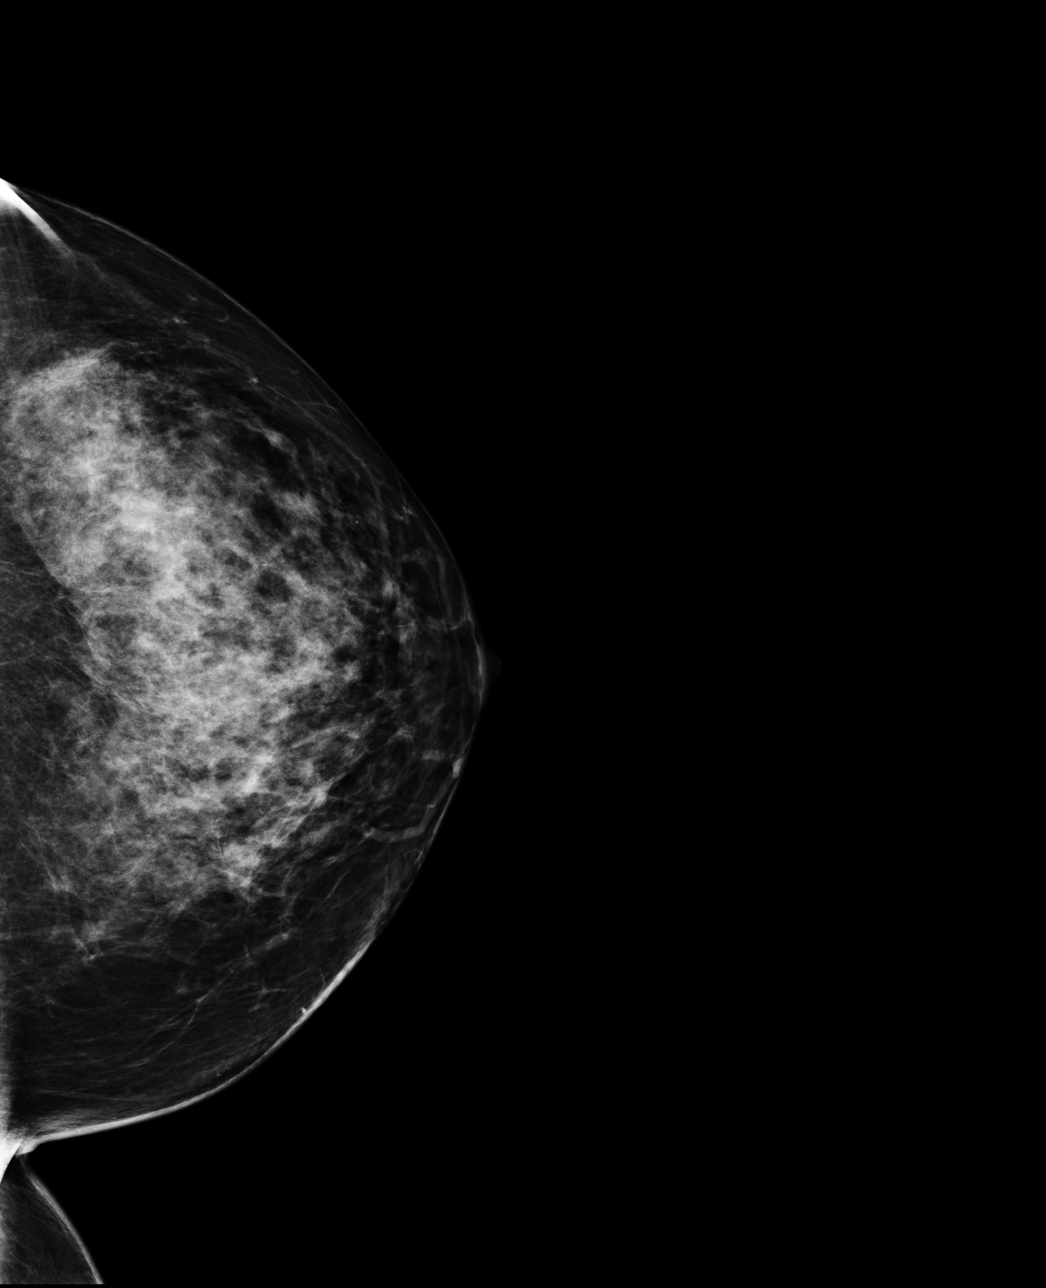

[L MLO]
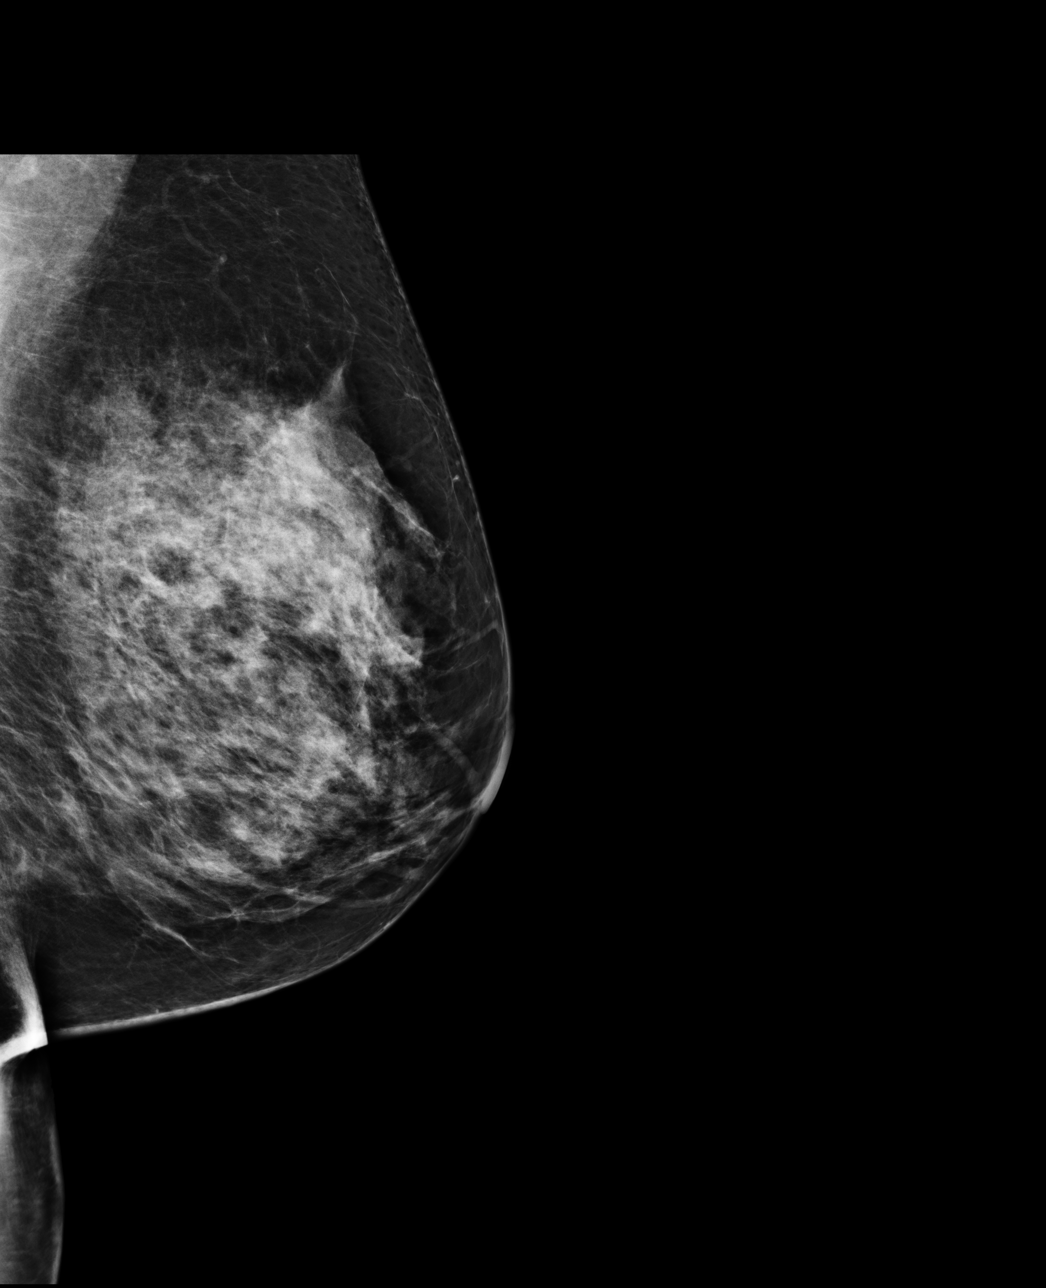

[R CC]
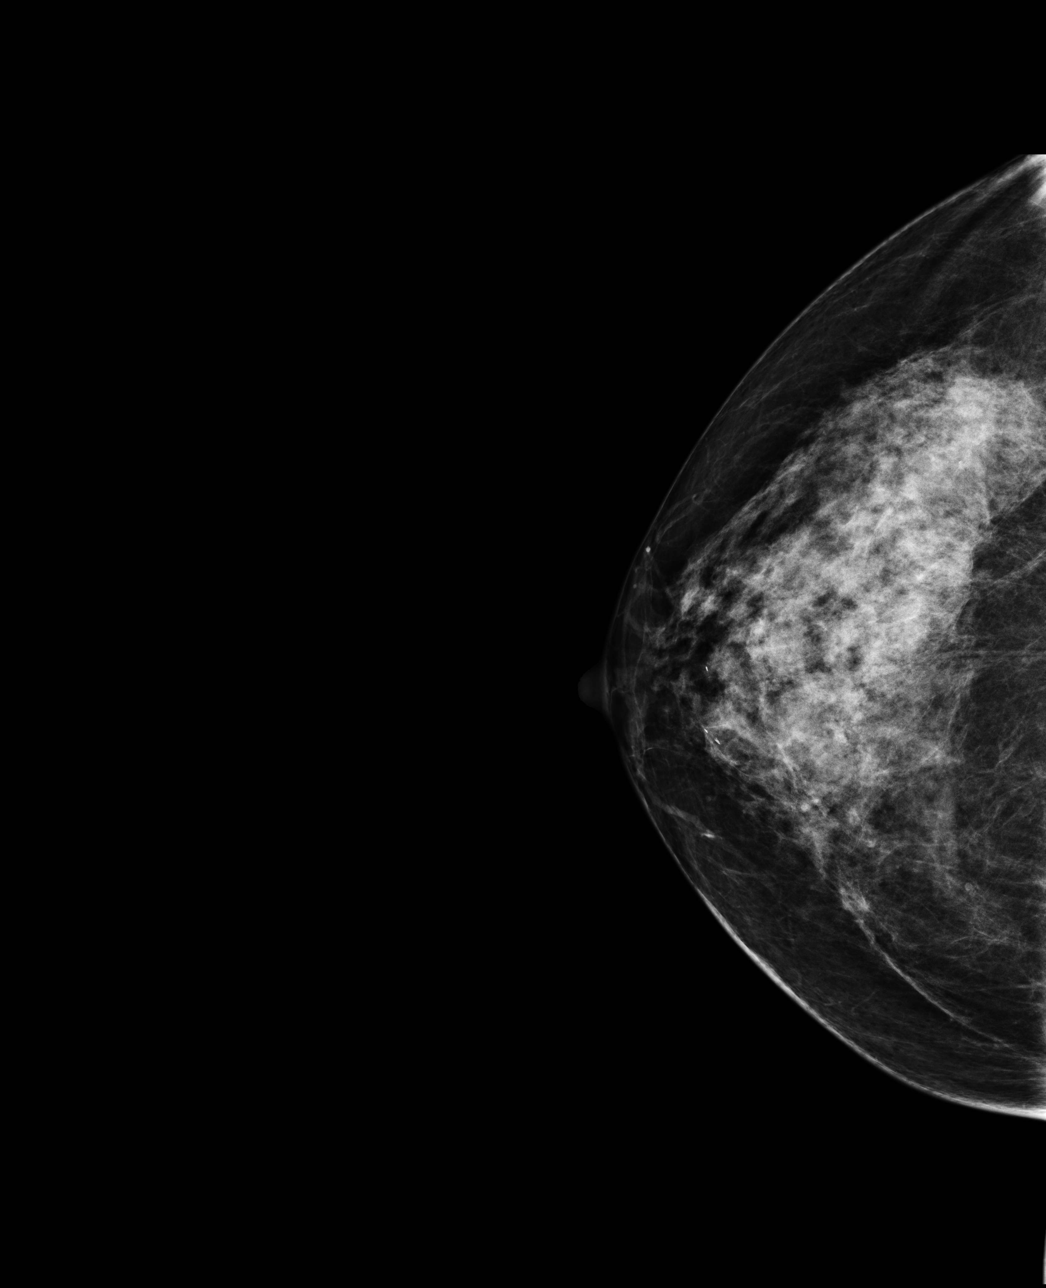

[R MLO]
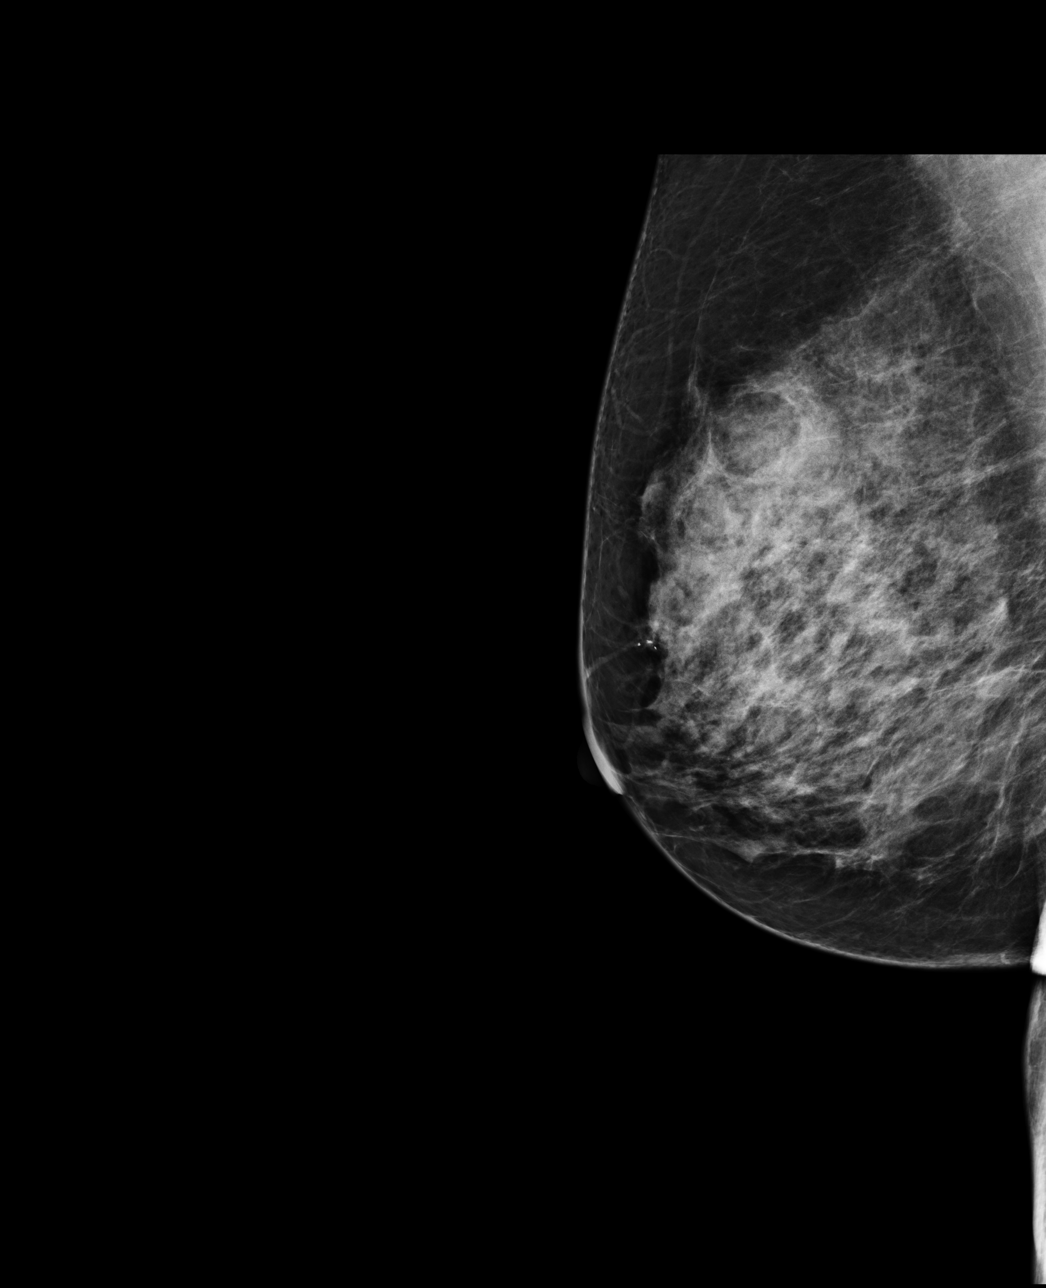

[4 of 4 positions shown; findings below may reference images not displayed]

ACR Breast Density Category d: The breast tissue is extremely dense,
which lowers the sensitivity of mammography.
FINDINGS: There are no findings suspicious for malignancy. Images were
processed with CAD.
IMPRESSION: No mammographic evidence of malignancy. A result letter of this
screening mammogram will be mailed directly to the patient.

RECOMMENDATION:
Screening mammogram in one year. (Code:[ZH])

BI-RADS CATEGORY  1: Negative.

## 2014-01-31 IMAGING — US US TRANSVAGINAL NON-OB
1 series · 14 of 25 positions shown · non-contrast
Comparison: [DATE]

CLINICAL DATA: Followup right ovarian cyst. Uterus and left ovary
have been removed.

EXAM:
TRANSABDOMINAL ULTRASOUND OF PELVIS
TECHNIQUE: Transabdominal ultrasound examination of the pelvis was performed
including evaluation of the uterus, ovaries, adnexal regions, and
pelvic cul-de-sac.

[Series 1: us transvaginal non-ob · 0.23mm/px · 14 of 52 slices shown]
[im 1/52]
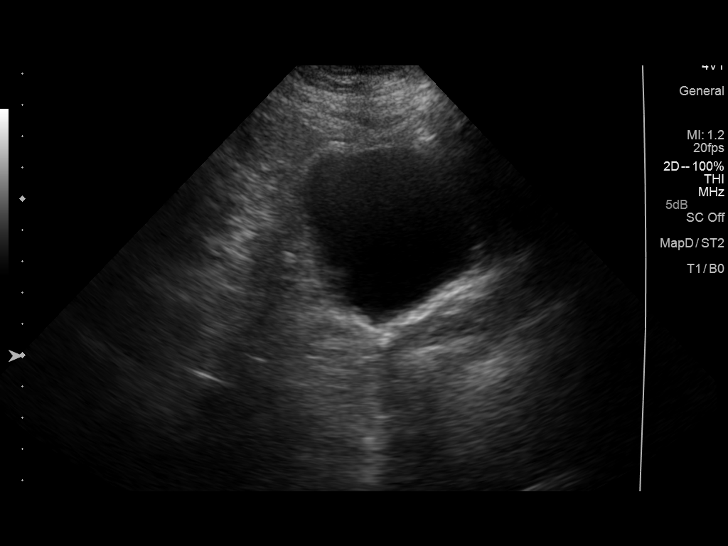
[im 5/52]
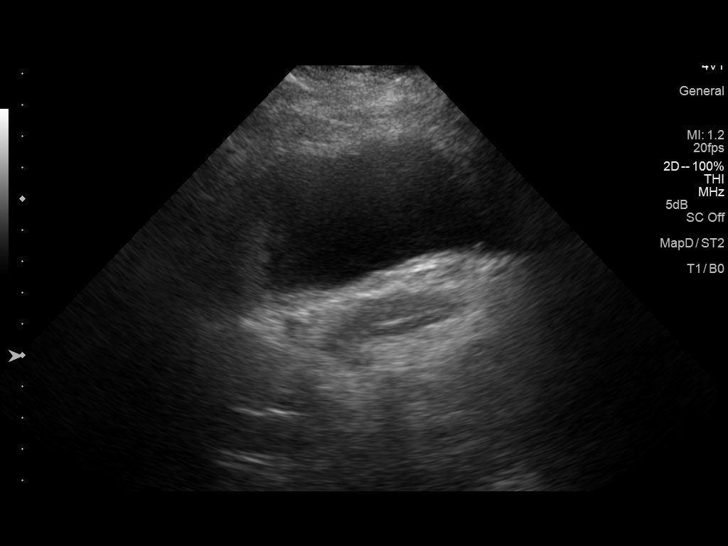
[im 9/52]
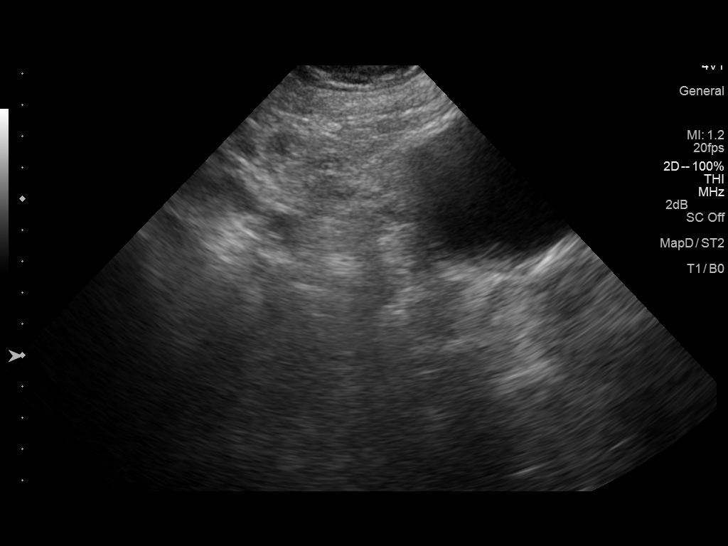
[im 13/52]
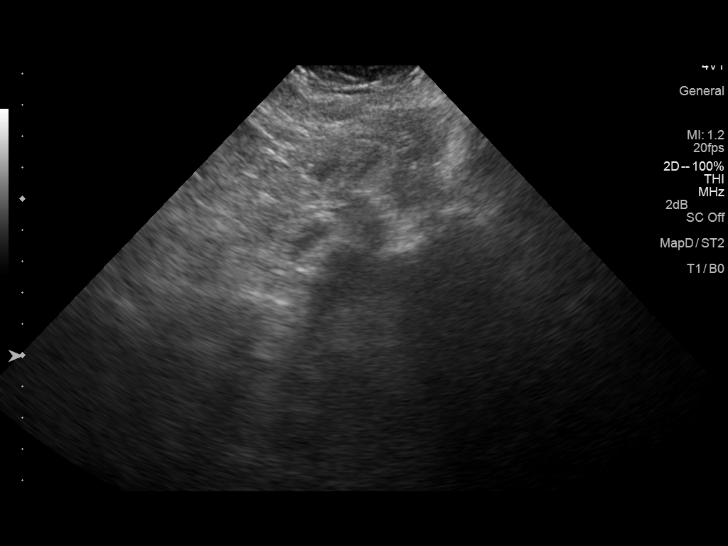
[im 18/52]
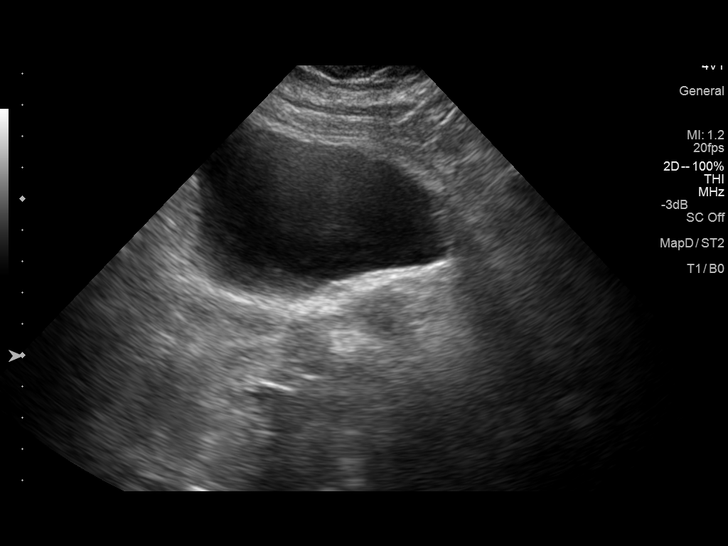
[im 20/52]
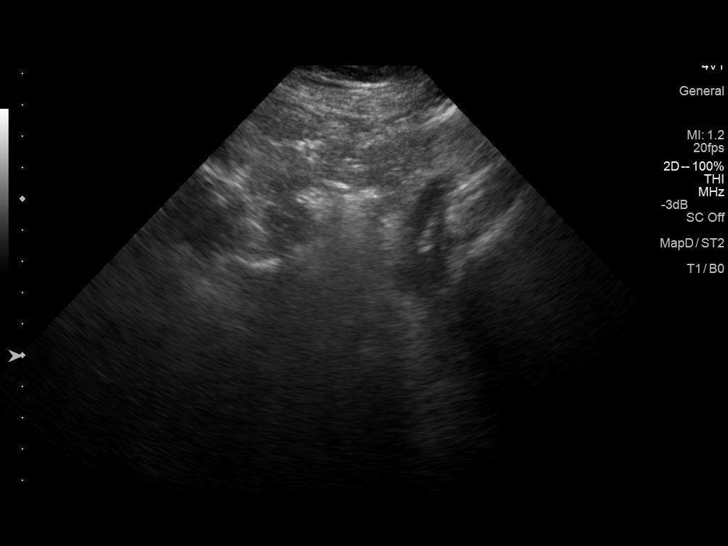
[im 24/52]
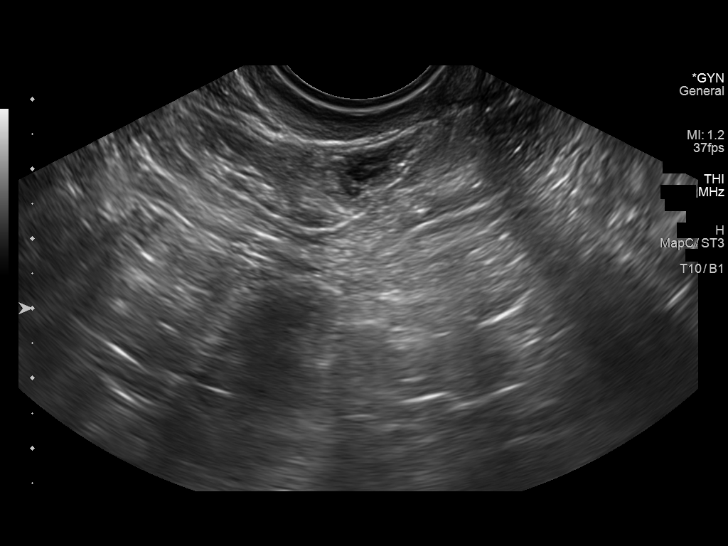
[im 28/52]
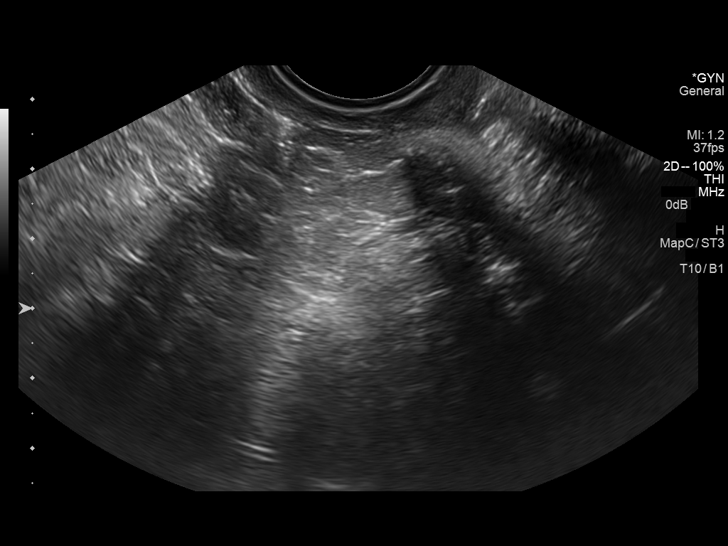
[im 32/52]
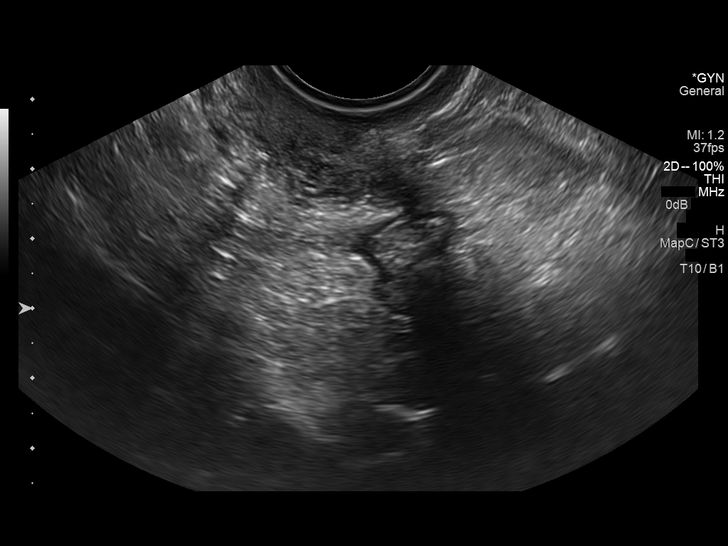
[im 35/52]
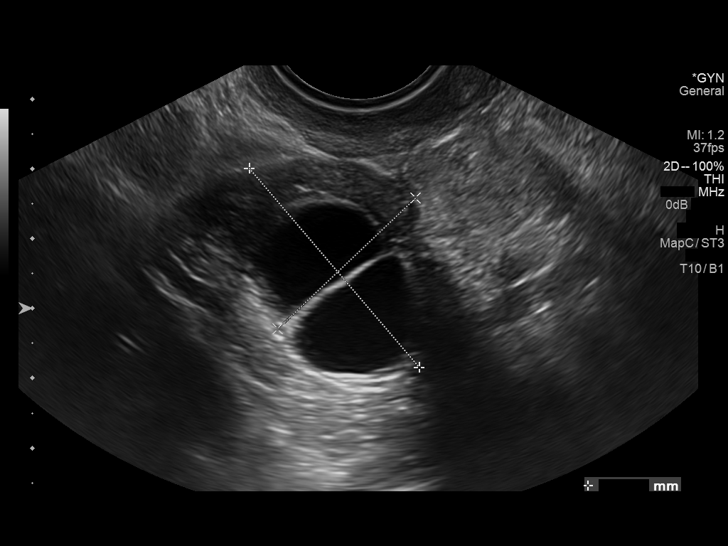
[im 39/52]
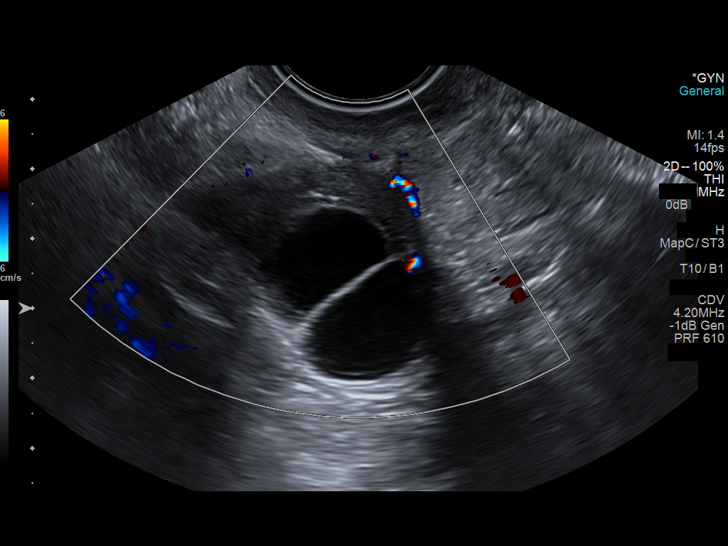
[im 43/52]
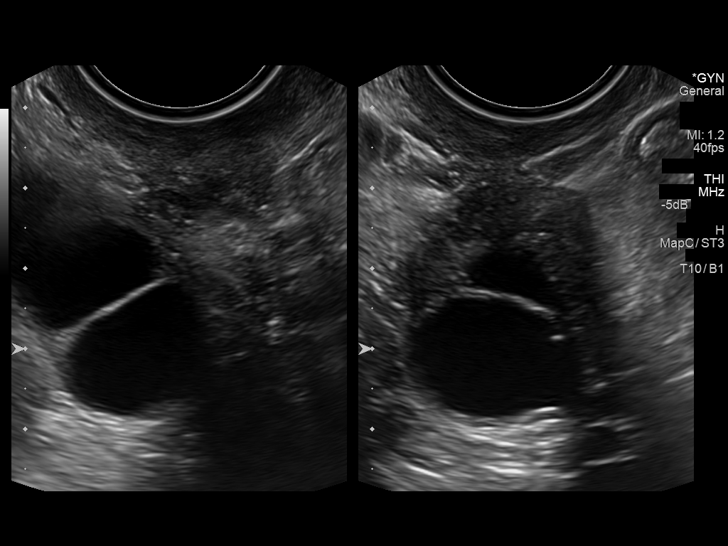
[im 47/52]
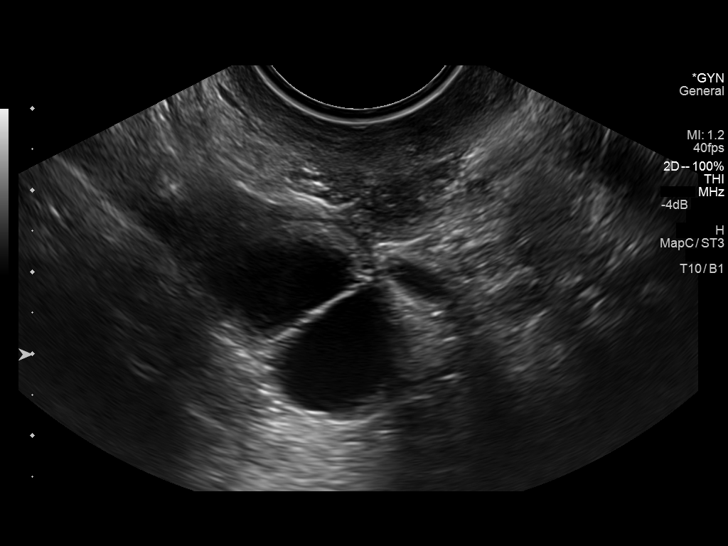
[im 52/52]
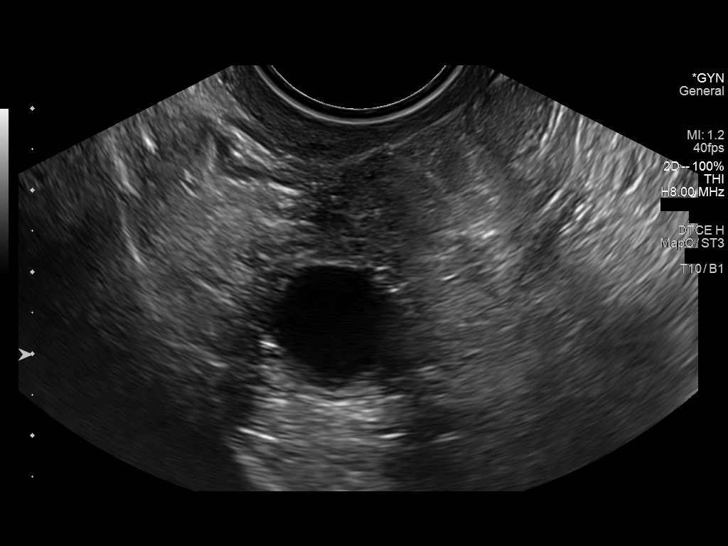

[14 of 25 positions shown; findings below may reference images not displayed]

FINDINGS: Uterus

Measurements: Surgically absent. The vaginal cuff region is normal
in appearance.

Endometrium

Thickness: Uterus is surgically absent.

Right ovary

Measurements: 3.8 x 2.7 x 2.1 cm. Two adjacent ovarian cysts appear
simple and measured 2.2 x 1.8 x 1.9 cm and 1.9 x 1.4 x 2.0 cm

Left ovary

Measurements: Surgically absent. No adnexal mass identified.
Scratched

Other findings:  No free fluid
IMPRESSION: 1. Stable appearance of small right ovarian cysts accounting for
slight differences in technique.
2. Status post hysterectomy and left oophorectomy.
3. No free pelvic fluid.

## 2014-01-31 NOTE — Telephone Encounter (Signed)
Pt aware of US results. 

## 2014-02-11 ENCOUNTER — Encounter: Payer: Self-pay | Admitting: Adult Health

## 2014-04-24 ENCOUNTER — Ambulatory Visit (INDEPENDENT_AMBULATORY_CARE_PROVIDER_SITE_OTHER): Payer: 59 | Admitting: Cardiology

## 2014-04-24 ENCOUNTER — Encounter: Payer: Self-pay | Admitting: Cardiology

## 2014-04-24 VITALS — BP 126/84 | HR 73 | Ht 67.0 in | Wt 177.0 lb

## 2014-04-24 DIAGNOSIS — I10 Essential (primary) hypertension: Secondary | ICD-10-CM

## 2014-04-24 DIAGNOSIS — I480 Paroxysmal atrial fibrillation: Secondary | ICD-10-CM

## 2014-04-24 MED ORDER — DILTIAZEM HCL 30 MG PO TABS: 30.0000 mg | ORAL_TABLET | Freq: Four times a day (QID) | ORAL | Status: DC | PRN

## 2014-04-24 MED ORDER — DILTIAZEM HCL ER COATED BEADS 180 MG PO CP24: 180.0000 mg | ORAL_CAPSULE | Freq: Every day | ORAL | Status: DC

## 2014-04-24 NOTE — Assessment & Plan Note (Signed)
Blood pressure control is reasonable today. 

## 2014-04-24 NOTE — Patient Instructions (Signed)
Your physician wants you to follow-up in: 1 year with Dr. Diona Browner. You will receive a reminder letter in the mail two months in advance. If you don't receive a letter, please call our office to schedule the follow-up appointment.   Your physician has recommended you make the following change in your medication:   Increase:  Cardizem CD to 180 mg Daily  I have refilled your Cardizem 30 mg  Thank you for choosing  [facility]!

## 2014-04-24 NOTE — Assessment & Plan Note (Signed)
We will increase Cardizem CD to 180 mg daily and continue as needed short acting Cardizem for palpitations. If her symptoms escalate further, we can always consider initiation of flecainide for antiarrhythmic suppression. She continues to prefer aspirin to anticoagulation. Follow-up in one year, sooner if needed.

## 2014-04-24 NOTE — Progress Notes (Signed)
Reason for visit: Atrial fibrillation  Clinical Summary Ms. Ekberg is a 64 y.o.female last seen in December 2014. She comes in for a routine visit. Over the last year, she has had continued intermittent feelings of heart rate irregularity. No prolonged episodes. This still responds to short acting as needed Cardizem tablets on top of her Cardizem CD. Symptoms are somewhat more frequent recently. She has had no chest pain, no hospitalizations. ECG today shows sinus rhythm with PACs.  Exercise echocardiogram from December 2014 was negative for ischemia, low risk Duke treadmill score of 8, no atrial fibrillation noted.  She still prefers to stay on aspirin, although we have discussed merits of anticoagulant therapy, her CHADSVASC score is 2, still relatively low thromboembolic risk overall. She denies any focal motor weakness, speech deficits, memory problems.  No Known Allergies  Current Outpatient Prescriptions  Medication Sig Dispense Refill  . aspirin 81 MG tablet Take 81 mg by mouth daily.      . calcium carbonate (OS-CAL) 600 MG TABS Take 600 mg by mouth daily.      Marland Kitchen diltiazem (CARDIZEM) 30 MG tablet Take 1 tablet (30 mg total) by mouth every 6 (six) hours as needed. Take 1 to 2 tablets every 6 hrs. as needed for palpitations 30 tablet 6  . ergocalciferol (VITAMIN D2) 50000 UNITS capsule Take 50,000 Units by mouth once a week.      . estradiol (VIVELLE-DOT) 0.1 MG/24HR patch Place 1 patch (0.1 mg total) onto the skin 2 (two) times a week. 24 patch prn  . Magnesium 100 MG CAPS Take by mouth.    . Multiple Vitamins-Minerals (MULTIVITAMIN WITH MINERALS) tablet Take 1 tablet by mouth daily.      . Multiple Vitamins-Minerals (PRESERVISION AREDS 2) CAPS Take 1 capsule by mouth 2 (two) times daily.    Marland Kitchen diltiazem (CARDIZEM CD) 180 MG 24 hr capsule Take 1 capsule (180 mg total) by mouth daily. 90 capsule 3   No current facility-administered medications for this visit.    Past Medical  History  Diagnosis Date  . Atrial fibrillation     Paroxysmal, CHADS2 score 1  . Hypertension   . Menopausal syndrome   . Current use of estrogen therapy 01/31/2013  . Right ovarian cyst 01/25/2014    Social History Ms. Weiss reports that she has never smoked. She has never used smokeless tobacco. Ms. Satchell reports that she drinks alcohol.  Review of Systems Complete review of systems negative except as otherwise outlined in the clinical summary and also the following. Stable appetite. No orthopnea or PND. No syncope.  Physical Examination Filed Vitals:   04/24/14 0840  BP: 126/84  Pulse: 73   Filed Weights   04/24/14 0840  Weight: 177 lb (80.287 kg)    Normally nourished appearing woman, appears comfortable at rest.  HEENT: Conjunctiva and lids normal, oropharynx clear.  Neck: Supple, no elevated JVP or bruits.  Lungs: Clear to auscultation, nontender.  Cardiac: Regular rate and rhythm with occasional ectopy, no mid systolic click or S3.  Abdomen: Soft, nontender, bowel sounds present.  Skin: Warm and dry.   Problem List and Plan   Paroxysmal atrial fibrillation We will increase Cardizem CD to 180 mg daily and continue as needed short acting Cardizem for palpitations. If her symptoms escalate further, we can always consider initiation of flecainide for antiarrhythmic suppression. She continues to prefer aspirin to anticoagulation. Follow-up in one year, sooner if needed.   Essential hypertension, benign Blood pressure control  is reasonable today.     [name], M.D., F.A.C.C.

## 2014-04-26 NOTE — Addendum Note (Signed)
Addended by: Kerney Elbe on: 04/26/2014 09:36 AM   Modules accepted: Orders

## 2014-11-21 ENCOUNTER — Other Ambulatory Visit: Payer: Self-pay | Admitting: Adult Health

## 2014-11-28 ENCOUNTER — Ambulatory Visit (INDEPENDENT_AMBULATORY_CARE_PROVIDER_SITE_OTHER): Payer: 59 | Admitting: Adult Health

## 2014-11-28 ENCOUNTER — Encounter: Payer: Self-pay | Admitting: Adult Health

## 2014-11-28 VITALS — BP 120/70 | HR 59 | Ht 66.5 in | Wt 181.0 lb

## 2014-11-28 DIAGNOSIS — Z01419 Encounter for gynecological examination (general) (routine) without abnormal findings: Secondary | ICD-10-CM | POA: Diagnosis not present

## 2014-11-28 DIAGNOSIS — Z79899 Other long term (current) drug therapy: Secondary | ICD-10-CM

## 2014-11-28 DIAGNOSIS — Z1212 Encounter for screening for malignant neoplasm of rectum: Secondary | ICD-10-CM | POA: Diagnosis not present

## 2014-11-28 DIAGNOSIS — Z1389 Encounter for screening for other disorder: Secondary | ICD-10-CM

## 2014-11-28 DIAGNOSIS — N3941 Urge incontinence: Secondary | ICD-10-CM

## 2014-11-28 LAB — HEMOCCULT GUIAC POC 1CARD (OFFICE): FECAL OCCULT BLD: NEGATIVE

## 2014-11-28 LAB — POCT URINALYSIS DIPSTICK
Blood, UA: NEGATIVE
GLUCOSE UA: NEGATIVE
LEUKOCYTES UA: NEGATIVE
Nitrite, UA: NEGATIVE
PROTEIN UA: NEGATIVE

## 2014-11-28 MED ORDER — ESTRADIOL 0.1 MG/24HR TD PTTW: 1.0000 | MEDICATED_PATCH | TRANSDERMAL | Status: DC

## 2014-11-28 NOTE — Patient Instructions (Signed)
Physical in 1 year Mammogram yearly Colonoscopy per GI Try stretches and ice for back Void more often and decrease caffeine Back Pain, Adult Low back pain is very common. About 1 in 5 people have back pain.The cause of low back pain is rarely dangerous. The pain often gets better over time.About half of people with a sudden onset of back pain feel better in just 2 weeks. About 8 in 10 people feel better by 6 weeks.  CAUSES Some common causes of back pain include:  Strain of the muscles or ligaments supporting the spine.  Wear and tear (degeneration) of the spinal discs.  Arthritis.  Direct injury to the back. DIAGNOSIS Most of the time, the direct cause of low back pain is not known.However, back pain can be treated effectively even when the exact cause of the pain is unknown.Answering your caregiver's questions about your overall health and symptoms is one of the most accurate ways to make sure the cause of your pain is not dangerous. If your caregiver needs more information, he or she may order lab work or imaging tests (X-rays or MRIs).However, even if imaging tests show changes in your back, this usually does not require surgery. HOME CARE INSTRUCTIONS For many people, back pain returns.Since low back pain is rarely dangerous, it is often a condition that people can learn to [facility] their own.   Remain active. It is stressful on the back to sit or stand in one place. Do not sit, drive, or stand in one place for more than 30 minutes at a time. Take short walks on level surfaces as soon as pain allows.Try to increase the length of time you walk each day.  Do not stay in bed.Resting more than 1 or 2 days can delay your recovery.  Do not avoid exercise or work.Your body is made to move.It is not dangerous to be active, even though your back may hurt.Your back will likely heal faster if you return to being active before your pain is gone.  Pay attention to your body when you  bend and lift. Many people have less discomfortwhen lifting if they bend their knees, keep the load close to their bodies,and avoid twisting. Often, the most comfortable positions are those that put less stress on your recovering back.  Find a comfortable position to sleep. Use a firm mattress and lie on your side with your knees slightly bent. If you lie on your back, put a pillow under your knees.  Only take over-the-counter or prescription medicines as directed by your caregiver. Over-the-counter medicines to reduce pain and inflammation are often the most helpful.Your caregiver may prescribe muscle relaxant drugs.These medicines help dull your pain so you can more quickly return to your normal activities and healthy exercise.  Put ice on the injured area.  Put ice in a plastic bag.  Place a towel between your skin and the bag.  Leave the ice on for 15-20 minutes, 03-04 times a day for the first 2 to 3 days. After that, ice and heat may be alternated to reduce pain and spasms.  Ask your caregiver about trying back exercises and gentle massage. This may be of some benefit.  Avoid feeling anxious or stressed.Stress increases muscle tension and can worsen back pain.It is important to recognize when you are anxious or stressed and learn ways to manage it.Exercise is a great option. SEEK MEDICAL CARE IF:  You have pain that is not relieved with rest or medicine.  You have  pain that does not improve in 1 week.  You have new symptoms.  You are generally not feeling well. SEEK IMMEDIATE MEDICAL CARE IF:   You have pain that radiates from your back into your legs.  You develop new bowel or bladder control problems.  You have unusual weakness or numbness in your arms or legs.  You develop nausea or vomiting.  You develop abdominal pain.  You feel faint. Document Released: 03/29/2005 Document Revised: 09/28/2011 Document Reviewed: 07/31/2013 [facility] Patient Information 2015  [place], [place]. This information is not intended to replace advice given to you by your health care provider. Make sure you discuss any questions you have with your health care provider.

## 2014-11-28 NOTE — Progress Notes (Addendum)
Patient ID: Renee Benitez, female   DOB: December 11, 1950, 64 y.o.   MRN: [id number] History of Present Illness: Renee Benitez is a 64 year old white female, married, in for well woman gyn exam.She is retiring from [employer] in 78 days.She has some Urge incontinence and low back pain at times. She and husband planning a motor cycle trip out [place] next fall.   Current Medications, Allergies, Past Medical History, Past Surgical History, Family History and Social History were reviewed in [employer] record.     Review of Systems:  Patient denies any headaches, hearing loss, fatigue, blurred vision, shortness of breath, chest pain, abdominal pain, problems with bowel movements,  or intercourse. No joint pain or mood swings. See HPI for positives, she said she may try to wean off patches after retirement.   Physical Exam:BP 120/70 mmHg  Pulse 59  Ht 5' 6.5" (1.689 m)  Wt 181 lb (82.101 kg)  BMI 28.78 kg/m2 urine dipstick negative. General:  Well developed, well nourished, no acute distress Skin:  Warm and dry Neck:  Midline trachea, normal thyroid, good ROM, no lymphadenopathy, no carotid bruits Lungs; Clear to auscultation bilaterally Breast:  No dominant palpable mass, retraction, or nipple discharge Cardiovascular: irregular rate and rhythm at times has history of A fib. Abdomen:  Soft, non tender, no hepatosplenomegaly Pelvic:  External genitalia is normal in appearance, no lesions.  The vagina is normal in appearance. Urethra has no lesions or masses. The cervix and uterus are absent.  No adnexal masses or tenderness noted.Bladder is non tender, no masses felt. Rectal: Good sphincter tone, no polyps, or hemorrhoids felt.  Hemoccult negative. Extremities/musculoskeletal:  No swelling or varicosities noted, no clubbing or cyanosis Psych:  No mood changes, alert and cooperative,seems happy   Impression: Well woman gyn exam no pap Urge incontinence ET    Plan:  Refilled Vivelle  dot .1 mg x 1 year  Physical in 1 year Mammogram yearly Colonoscopy per GI  Try stretches and ice on back if not better, call will get x ray,review handout on back pain  Void often and decrease caffeine Ask cardiologists about taking full strength ASA

## 2014-12-27 ENCOUNTER — Other Ambulatory Visit: Payer: Self-pay | Admitting: Adult Health

## 2014-12-27 DIAGNOSIS — Z1231 Encounter for screening mammogram for malignant neoplasm of breast: Secondary | ICD-10-CM

## 2015-02-07 ENCOUNTER — Ambulatory Visit (HOSPITAL_COMMUNITY)
Admission: RE | Admit: 2015-02-07 | Discharge: 2015-02-07 | Disposition: A | Discharge status: Home / Self Care | Payer: 59 | Source: Ambulatory Visit | Attending: Adult Health | Admitting: Adult Health

## 2015-02-07 DIAGNOSIS — Z1231 Encounter for screening mammogram for malignant neoplasm of breast: Secondary | ICD-10-CM | POA: Diagnosis not present

## 2015-02-18 ENCOUNTER — Other Ambulatory Visit: Payer: Self-pay | Admitting: Adult Health

## 2015-02-18 DIAGNOSIS — R928 Other abnormal and inconclusive findings on diagnostic imaging of breast: Secondary | ICD-10-CM

## 2015-02-19 ENCOUNTER — Other Ambulatory Visit: Payer: Self-pay | Admitting: Adult Health

## 2015-02-19 DIAGNOSIS — R928 Other abnormal and inconclusive findings on diagnostic imaging of breast: Secondary | ICD-10-CM

## 2015-02-21 ENCOUNTER — Ambulatory Visit
Admission: RE | Admit: 2015-02-21 | Discharge: 2015-02-21 | Disposition: A | Discharge status: Home / Self Care | Payer: 59 | Source: Ambulatory Visit | Attending: Adult Health | Admitting: Adult Health

## 2015-02-21 DIAGNOSIS — R928 Other abnormal and inconclusive findings on diagnostic imaging of breast: Secondary | ICD-10-CM

## 2015-02-21 IMAGING — MG MM DIAG BREAST TOMO UNI RIGHT
4 series · 4 of 12 positions shown · non-contrast
Comparison: Previous exam(s).

CLINICAL DATA: Further evaluation of possible mass subareolar right
breast

EXAM:
DIGITAL DIAGNOSTIC RIGHT MAMMOGRAM WITH 3D TOMOSYNTHESIS AND CAD

[R CC]
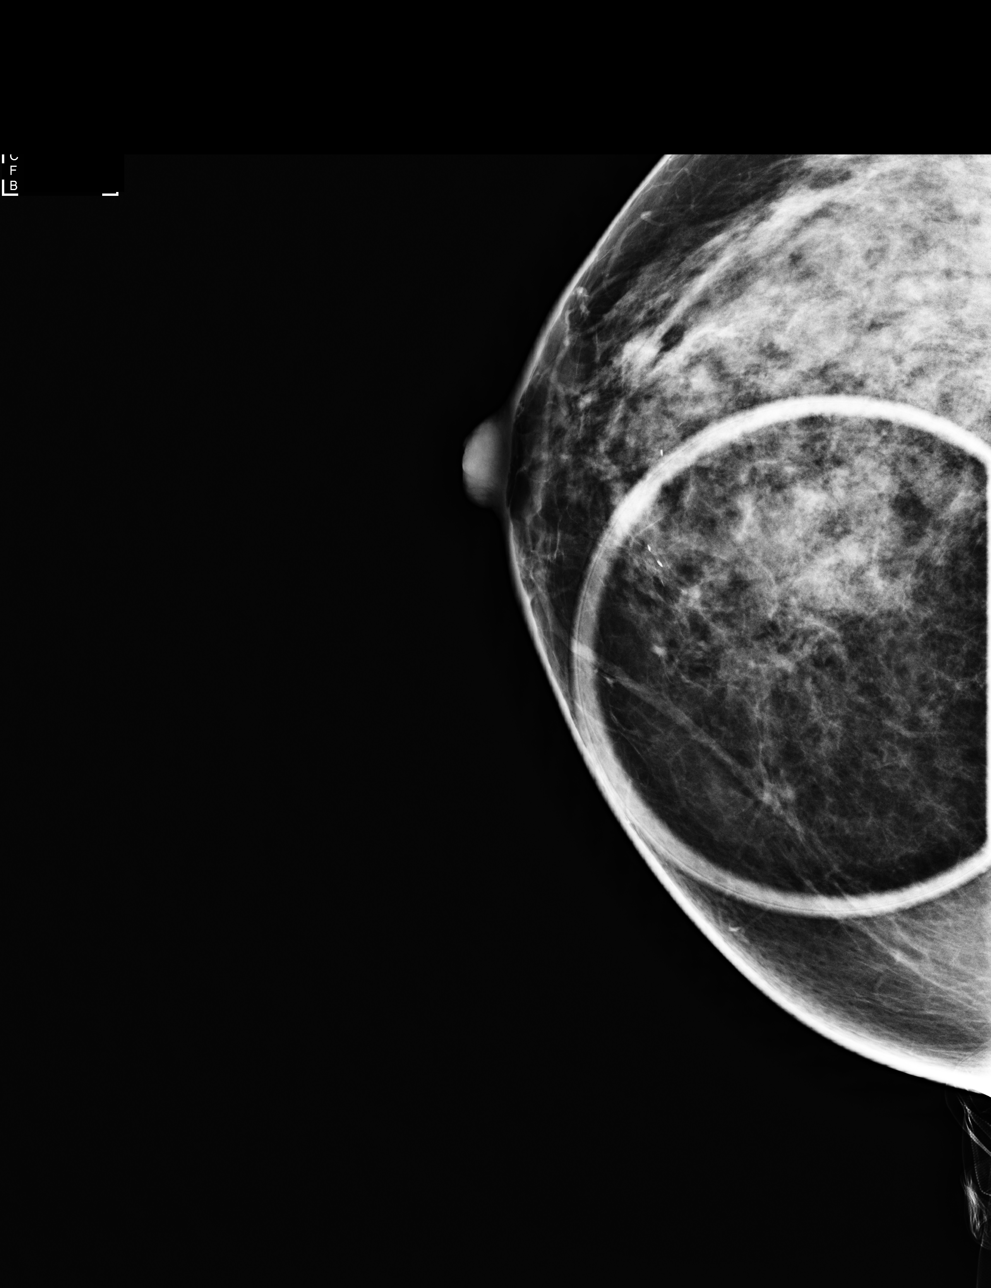

[R MLO]
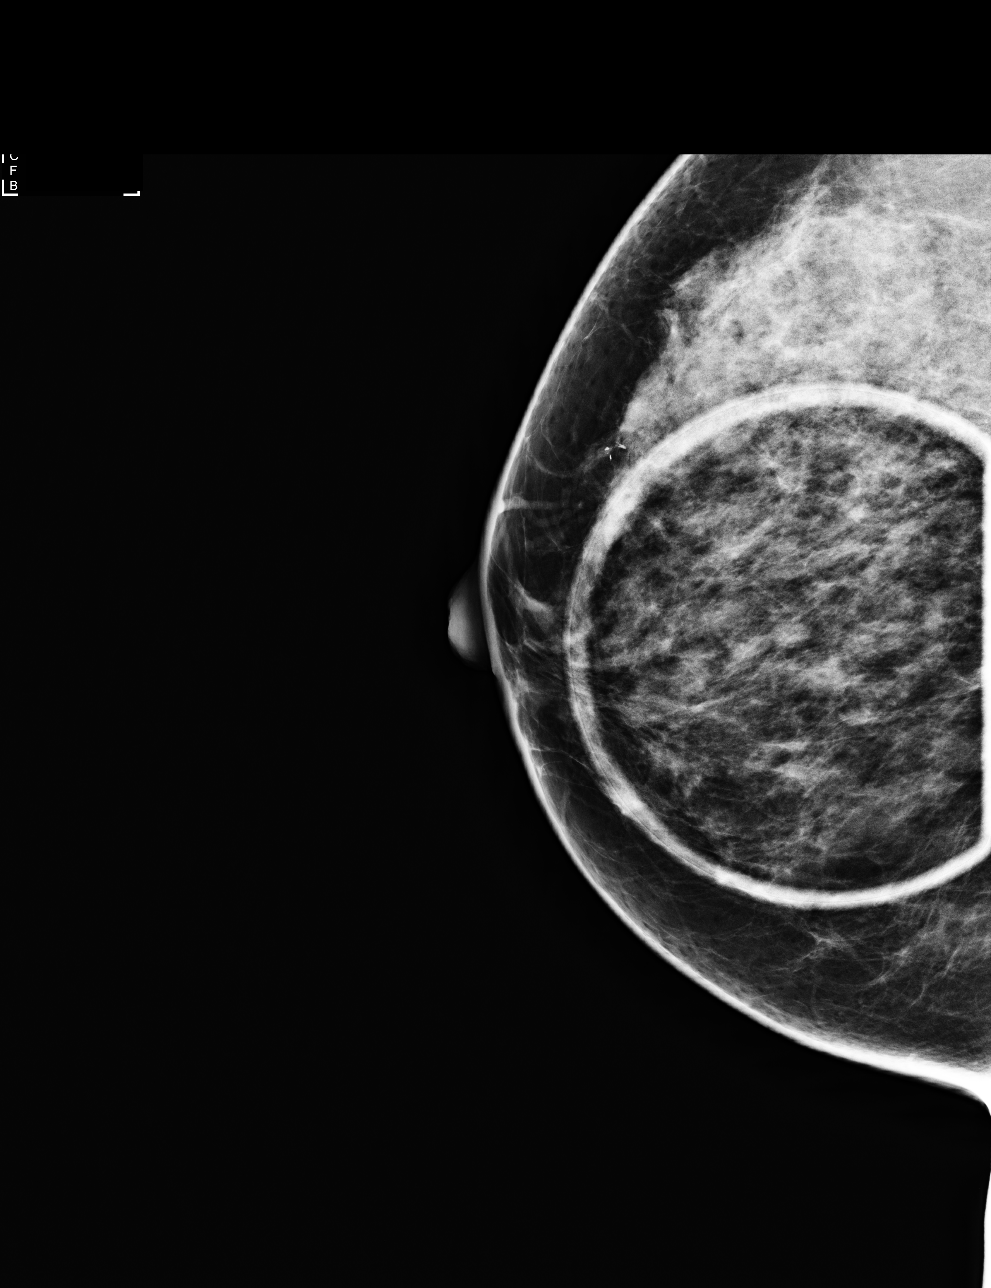

[R MLO tomo · tomo slice 37/72.0]
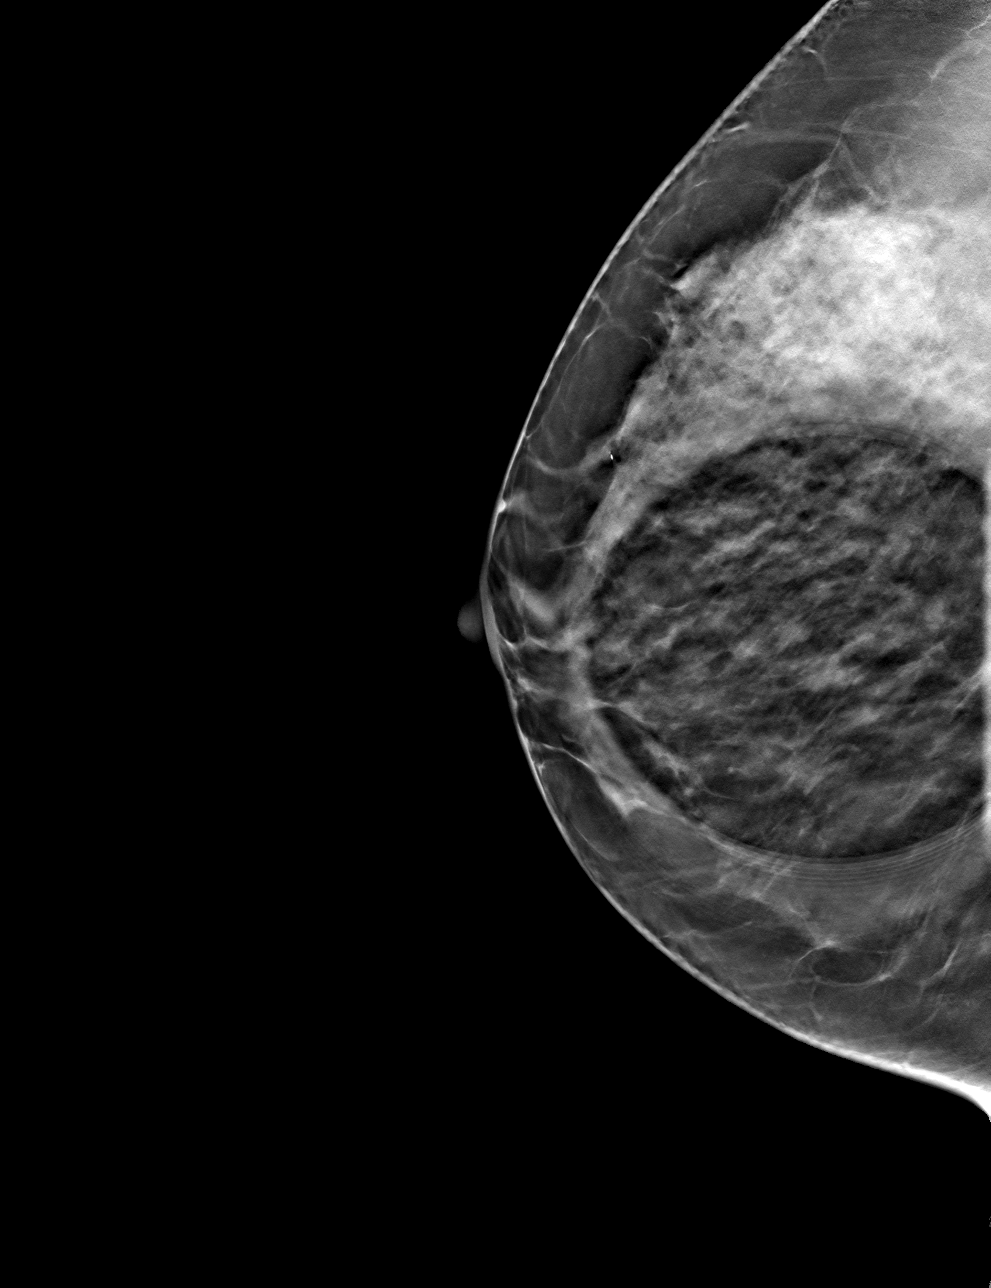

[R CC tomo · tomo slice 34/67.0]
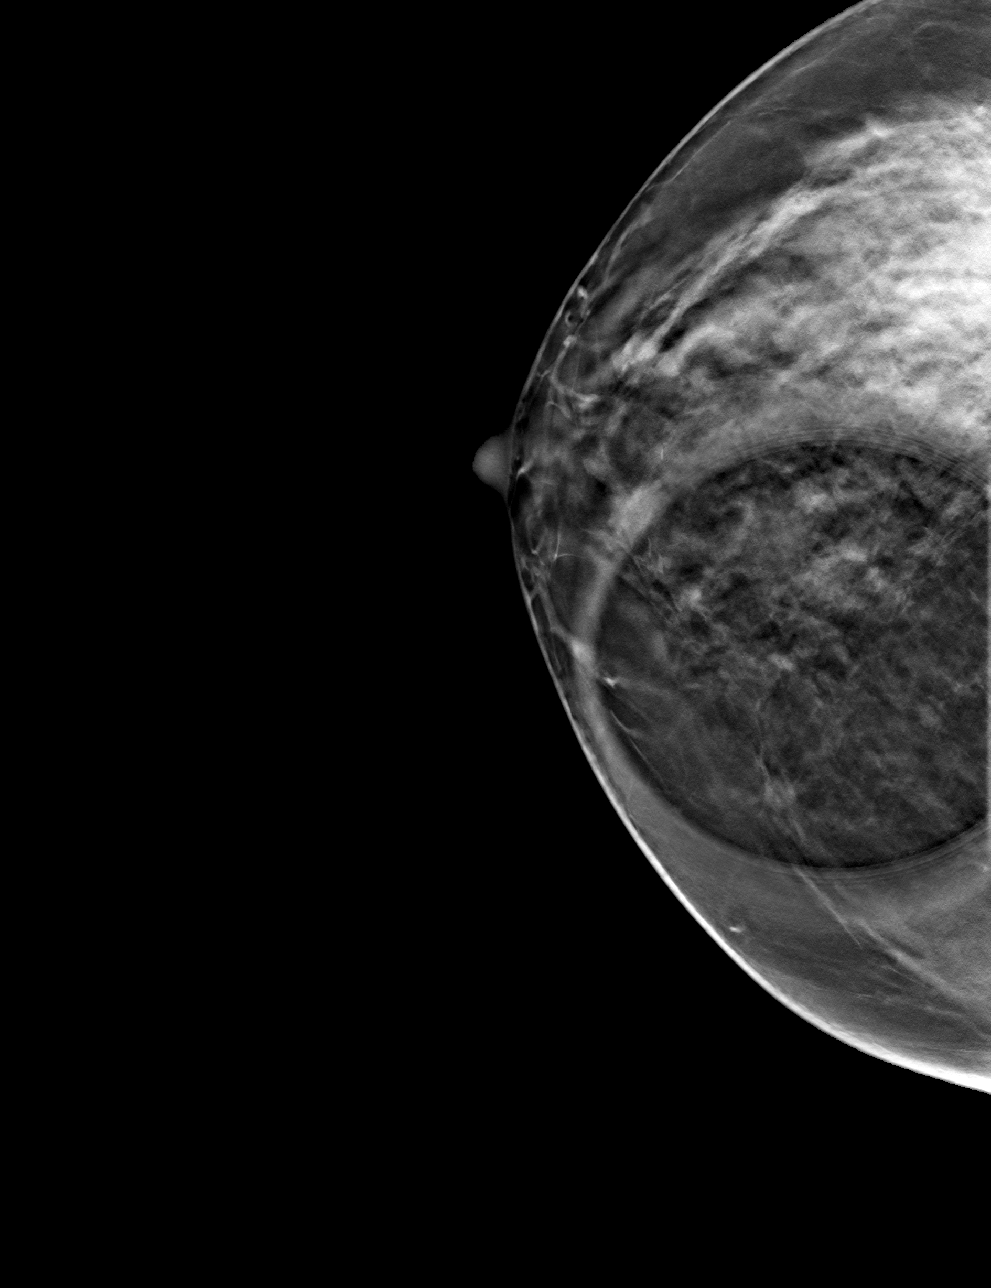

[4 of 12 positions shown; findings below may reference images not displayed]

ACR Breast Density Category c: The breast tissue is heterogeneously
dense, which may obscure small masses.
FINDINGS: No persistent abnormalities on spot compression tomographic views.

Mammographic images were processed with CAD.
IMPRESSION: Negative

RECOMMENDATION:
Screening mammogram in 1 year

I have discussed the findings and recommendations with the patient.
Results were also provided in writing at the conclusion of the
visit. If applicable, a reminder letter will be sent to the patient
regarding the next appointment.

BI-RADS CATEGORY  1: Negative.

## 2015-04-03 ENCOUNTER — Other Ambulatory Visit: Payer: Self-pay | Admitting: Cardiology

## 2015-04-03 MED ORDER — DILTIAZEM HCL ER COATED BEADS 180 MG PO CP24: 180.0000 mg | ORAL_CAPSULE | Freq: Every day | ORAL | Status: DC

## 2015-04-03 NOTE — Telephone Encounter (Signed)
Refill complete 

## 2015-04-03 NOTE — Telephone Encounter (Signed)
Pt is out of her Diltiazem 180 mg, she's scheduled to see Diona Browner in Feb for her 1 yr f/u, pt uses the [facility] [address] [place] is the address

## 2015-05-20 ENCOUNTER — Ambulatory Visit: Payer: 59 | Admitting: Cardiology

## 2015-06-20 ENCOUNTER — Ambulatory Visit (INDEPENDENT_AMBULATORY_CARE_PROVIDER_SITE_OTHER): Payer: 59 | Admitting: Cardiology

## 2015-06-20 ENCOUNTER — Encounter: Payer: Self-pay | Admitting: Cardiology

## 2015-06-20 VITALS — BP 147/77 | HR 65 | Ht 67.0 in | Wt 181.2 lb

## 2015-06-20 DIAGNOSIS — I480 Paroxysmal atrial fibrillation: Secondary | ICD-10-CM | POA: Diagnosis not present

## 2015-06-20 DIAGNOSIS — I10 Essential (primary) hypertension: Secondary | ICD-10-CM

## 2015-06-20 MED ORDER — DILTIAZEM HCL ER COATED BEADS 180 MG PO CP24: 180.0000 mg | ORAL_CAPSULE | Freq: Every day | ORAL | Status: DC

## 2015-06-20 NOTE — Progress Notes (Signed)
Cardiology Office Note  Date: 06/20/2015   ID: Renee Benitez, DOB [birth year], MRN [id number]  PCP: Robert Bellow, MD  Primary Cardiologist: Rozann Lesches, MD   Chief Complaint  Patient presents with  . PAF    History of Present Illness: Renee Benitez is a 65 y.o. female last seen in January 2016. She presents for a routine follow-up visit. She has retired since we last met. 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 From a cardiac perspective she has done very well, does not recall having any significant palpitations since we increased her Cardizem CD dose to 180 mg daily.  CHADSVASC score is 2. Annual risk of stroke on aspirin is approximately 2.3%. We will continue to discuss her stroke risk over time, for now she remains comfortable staying on aspirin.  ECG today shows normal sinus rhythm.  Past Medical History  Diagnosis Date  . Atrial fibrillation ([place])   . Hypertension   . Menopausal syndrome   . Current use of estrogen therapy 01/31/2013  . Right ovarian cyst 01/25/2014  . Macular degeneration     Current Outpatient Prescriptions  Medication Sig Dispense Refill  . aspirin 81 MG tablet Take 81 mg by mouth daily.      . calcium carbonate (OS-CAL) 600 MG TABS Take 600 mg by mouth daily.      Marland Kitchen diltiazem (CARDIZEM CD) 180 MG 24 hr capsule Take 1 capsule (180 mg total) by mouth daily. 90 capsule 3  . diltiazem (CARDIZEM) 30 MG tablet Take 1 tablet (30 mg total) by mouth every 6 (six) hours as needed. Take 1 to 2 tablets every 6 hrs. as needed for palpitations 30 tablet 6  . ergocalciferol (VITAMIN D2) 50000 UNITS capsule Take 50,000 Units by mouth once a week.      . estradiol (VIVELLE-DOT) 0.1 MG/24HR patch Place 1 patch (0.1 mg total) onto the skin 2 (two) times a week. 24 patch prn  . Magnesium 250 MG TABS Take by mouth daily.    . Multiple Vitamins-Minerals (MULTIVITAMIN  WITH MINERALS) tablet Take 1 tablet by mouth daily.      . Multiple Vitamins-Minerals (PRESERVISION AREDS 2) CAPS Take 1 capsule by mouth 2 (two) times daily.     No current facility-administered medications for this visit.   Allergies:  Review of patient's allergies indicates no known allergies.   Social History: The patient  reports that she has never smoked. She has never used smokeless tobacco. She reports that she drinks alcohol. She reports that she does not use illicit drugs.   ROS:  Please see the history of present illness. Otherwise, complete review of systems is positive for weight gain related to decreased activity.  All other systems are reviewed and negative.   Physical Exam: VS:  BP 147/77 mmHg  Pulse 65  Ht '5\' 7"'  (1.702 m)  Wt 181 lb 3.2 oz (82.192 kg)  BMI 28.37 kg/m2  SpO2 97%, BMI Body mass index is 28.37 kg/(m^2).  Wt Readings from Last 3 Encounters:  06/20/15 181 lb 3.2 oz (82.192 kg)  11/28/14 181 lb (82.101 kg)  04/24/14 177 lb (80.287 kg)    Appears comfortable at rest.  HEENT: Conjunctiva and lids normal, oropharynx clear.  Neck: Supple, no elevated JVP or bruits.  Lungs: Clear to auscultation, nontender.  Cardiac: Regular rate and rhythm with occasional ectopy, no mid systolic click  or S3.  Abdomen: Soft, nontender, bowel sounds present.  Skin: Warm and dry.  ECG: I personally reviewed the prior tracing from 04/24/2014 which showed sinus rhythm with PACs.  Other Studies Reviewed Today:  Exercise echocardiogram December 2014: Negative for ischemia, low risk Duke treadmill score of 8, no atrial fibrillation noted.  Assessment and Plan:  1. Paroxysmal atrial fibrillation, symptomatically well controlled on current regimen. CHADSVASC score is 2, she remains comfortable with aspirin at this time. We will continue observation.  2. Essential hypertension, blood pressure is mildly elevated today. We discussed weight loss. She will continue to follow  with Dr. Karie Kirks.  Current medicines were reviewed with the patient today.   Orders Placed This Encounter  Procedures  . EKG 12-Lead    Disposition: FU with me in 1 year.   Signed, Satira Sark, MD, [facility] 06/20/2015 3:47 PM    [place] [facility] [place], [place], [place] [address] Phone: [phone]; Fax: [phone]

## 2015-06-20 NOTE — Patient Instructions (Signed)

## 2016-02-26 ENCOUNTER — Other Ambulatory Visit: Payer: Self-pay | Admitting: Cardiology

## 2016-03-10 ENCOUNTER — Telehealth: Payer: Self-pay | Admitting: Adult Health

## 2016-03-10 MED ORDER — ESTRADIOL 0.1 MG/24HR TD PTTW: 1.0000 | MEDICATED_PATCH | TRANSDERMAL | 0 refills | Status: DC

## 2016-03-10 NOTE — Telephone Encounter (Signed)
Has appt in January and is weaning off patches, but needs refill

## 2016-03-25 ENCOUNTER — Other Ambulatory Visit: Payer: Self-pay | Admitting: Adult Health

## 2016-03-25 DIAGNOSIS — Z1231 Encounter for screening mammogram for malignant neoplasm of breast: Secondary | ICD-10-CM

## 2016-04-14 ENCOUNTER — Ambulatory Visit (HOSPITAL_COMMUNITY)
Admission: RE | Admit: 2016-04-14 | Discharge: 2016-04-14 | Disposition: A | Discharge status: Home / Self Care | Payer: 59 | Source: Ambulatory Visit | Attending: Adult Health | Admitting: Adult Health

## 2016-04-14 DIAGNOSIS — Z1231 Encounter for screening mammogram for malignant neoplasm of breast: Secondary | ICD-10-CM | POA: Diagnosis present

## 2016-04-14 IMAGING — MG 2D DIGITAL SCREENING BILATERAL MAMMOGRAM WITH CAD AND ADJUNCT TO
8 series · 8 of 24 positions shown · non-contrast
Comparison: Previous exam(s).

CLINICAL DATA: Screening.

EXAM:
2D DIGITAL SCREENING BILATERAL MAMMOGRAM WITH CAD AND ADJUNCT TOMO

[L MLO]
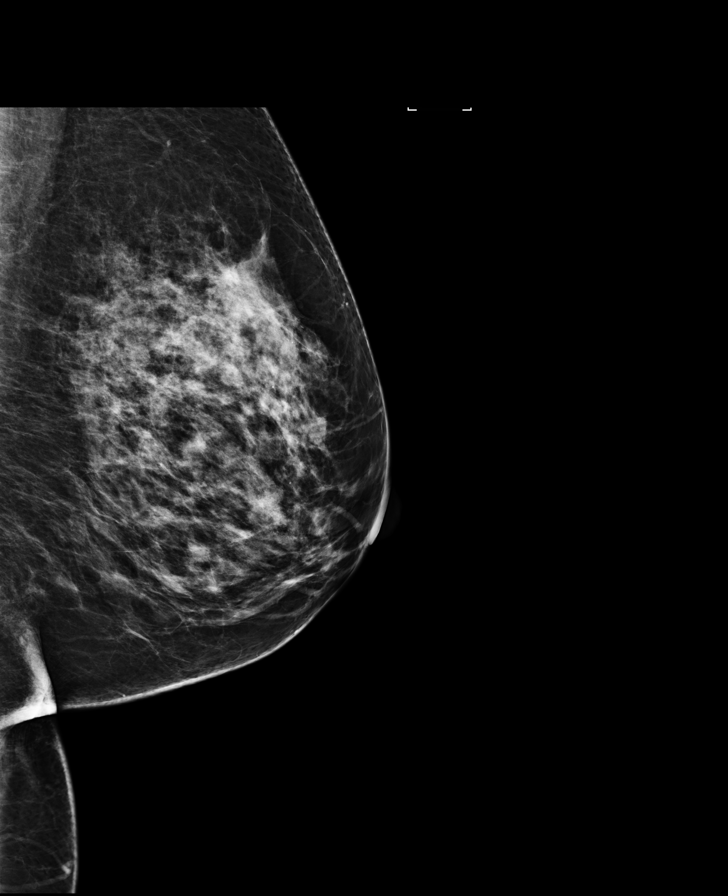

[R CC]
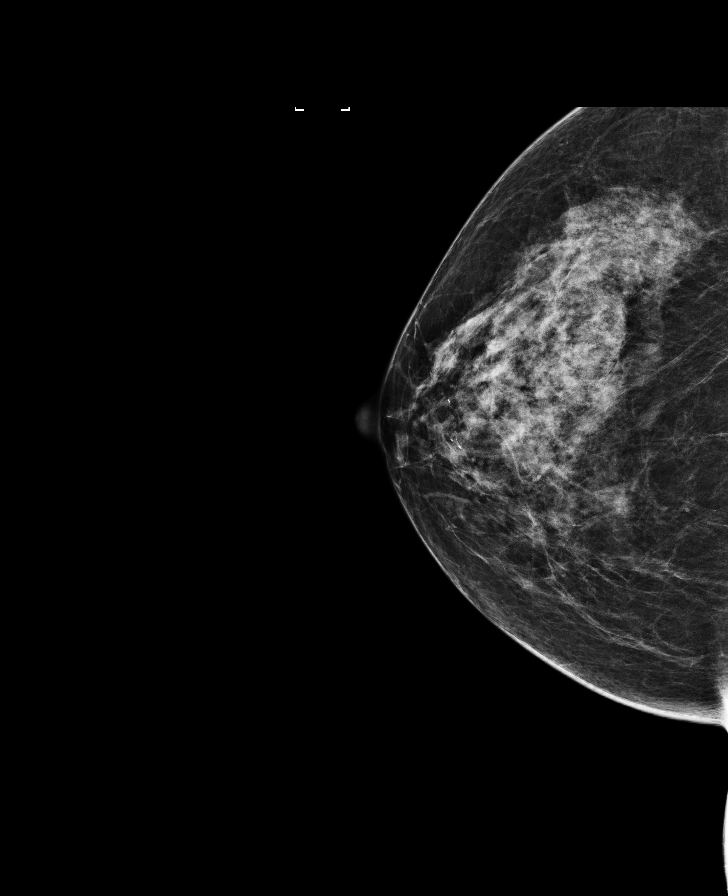

[R MLO]
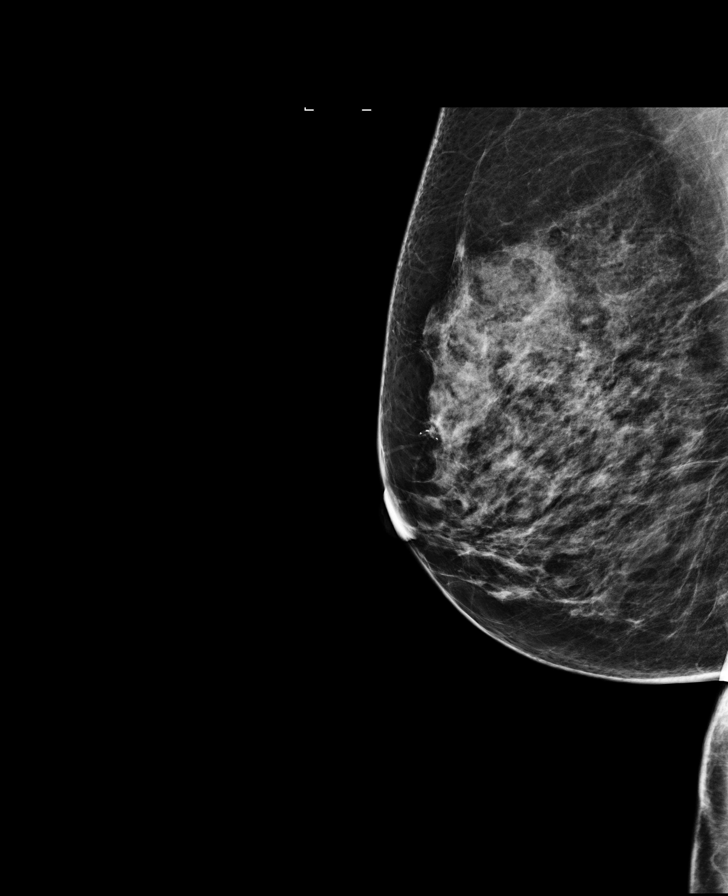

[L CC]
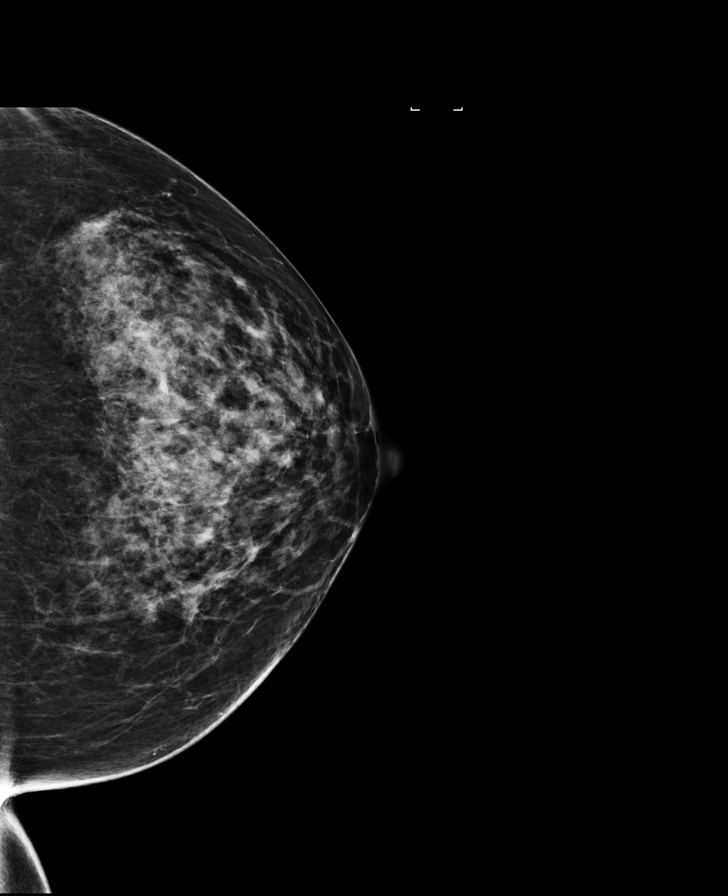

[R MLO tomo · tomo slice 36/71.0]
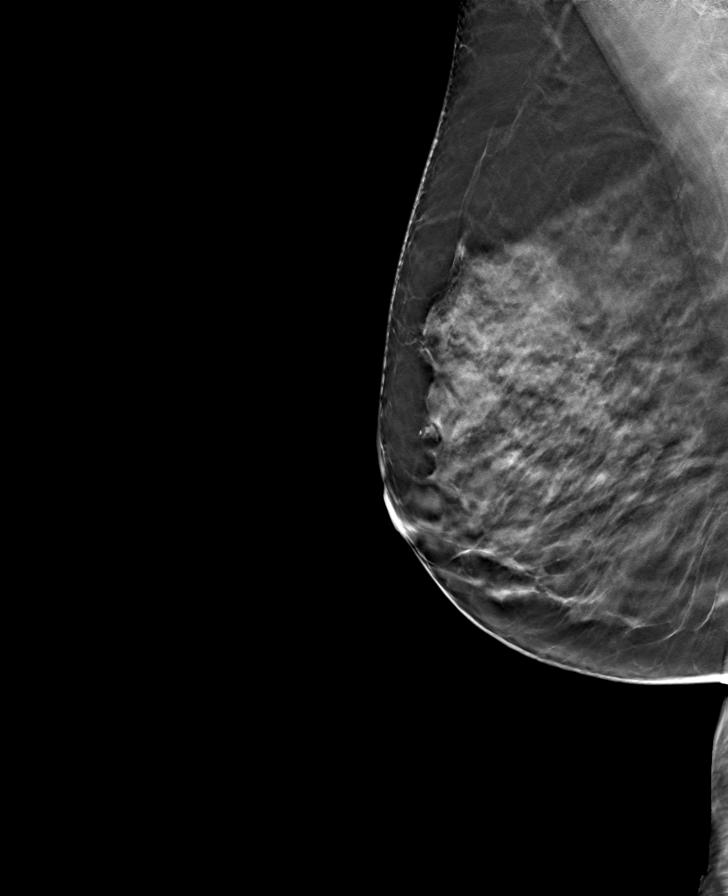

[L CC tomo · tomo slice 39/78.0]
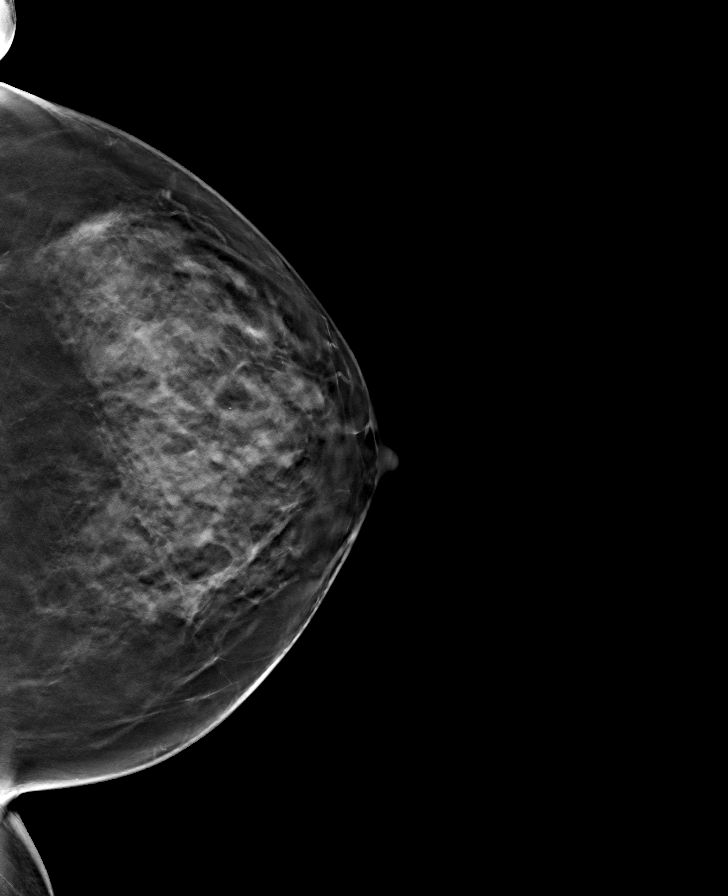

[L MLO tomo · tomo slice 41/81.0]
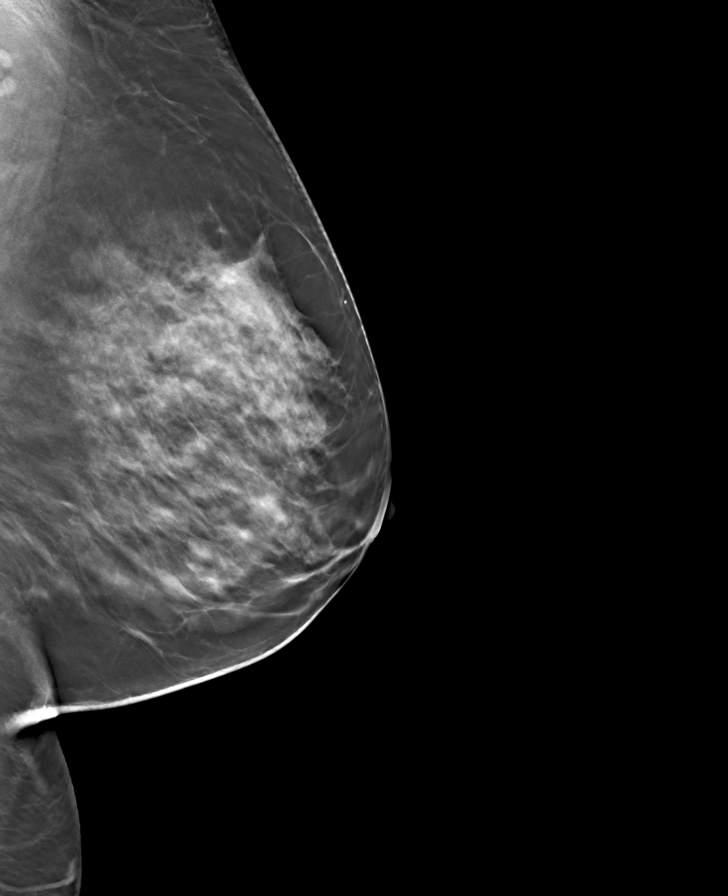

[R CC tomo · tomo slice 39/78.0]
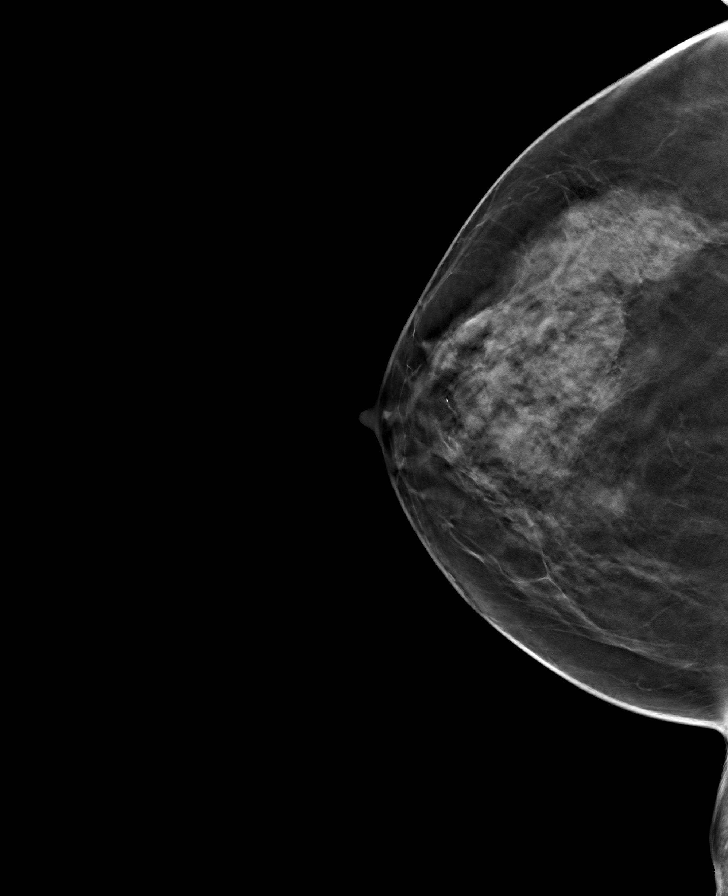

[8 of 24 positions shown; findings below may reference images not displayed]

ACR Breast Density Category d: The breast tissue is extremely dense,
which lowers the sensitivity of mammography.
FINDINGS: There are no findings suspicious for malignancy. Images were
processed with CAD.
IMPRESSION: No mammographic evidence of malignancy. A result letter of this
screening mammogram will be mailed directly to the patient.

RECOMMENDATION:
Screening mammogram in one year. (Code:[5L])

BI-RADS CATEGORY  1: Negative.

## 2016-04-19 ENCOUNTER — Encounter: Payer: Self-pay | Admitting: Adult Health

## 2016-04-19 ENCOUNTER — Ambulatory Visit (HOSPITAL_COMMUNITY)
Admission: RE | Admit: 2016-04-19 | Discharge: 2016-04-19 | Disposition: A | Discharge status: Home / Self Care | Payer: 59 | Source: Ambulatory Visit | Attending: Adult Health | Admitting: Adult Health

## 2016-04-19 ENCOUNTER — Ambulatory Visit (INDEPENDENT_AMBULATORY_CARE_PROVIDER_SITE_OTHER): Payer: 59 | Admitting: Adult Health

## 2016-04-19 VITALS — BP 142/76 | HR 92 | Ht 66.5 in | Wt 174.0 lb

## 2016-04-19 DIAGNOSIS — M25552 Pain in left hip: Secondary | ICD-10-CM | POA: Diagnosis not present

## 2016-04-19 DIAGNOSIS — M545 Low back pain, unspecified: Secondary | ICD-10-CM

## 2016-04-19 DIAGNOSIS — Z1212 Encounter for screening for malignant neoplasm of rectum: Secondary | ICD-10-CM | POA: Diagnosis not present

## 2016-04-19 DIAGNOSIS — Z01411 Encounter for gynecological examination (general) (routine) with abnormal findings: Secondary | ICD-10-CM | POA: Diagnosis not present

## 2016-04-19 DIAGNOSIS — Z79899 Other long term (current) drug therapy: Secondary | ICD-10-CM

## 2016-04-19 DIAGNOSIS — Z01419 Encounter for gynecological examination (general) (routine) without abnormal findings: Secondary | ICD-10-CM

## 2016-04-19 DIAGNOSIS — Z79818 Long term (current) use of other agents affecting estrogen receptors and estrogen levels: Secondary | ICD-10-CM

## 2016-04-19 LAB — HEMOCCULT GUIAC POC 1CARD (OFFICE): Fecal Occult Blood, POC: NEGATIVE

## 2016-04-19 IMAGING — DX DG LUMBAR SPINE 2-3V
3 series · 3 of 3 positions shown · non-contrast
Comparison: None.

CLINICAL DATA: Chronic low back pain without known injury.

EXAM:
LUMBAR SPINE - 2-3 VIEW

[l-spine ap]
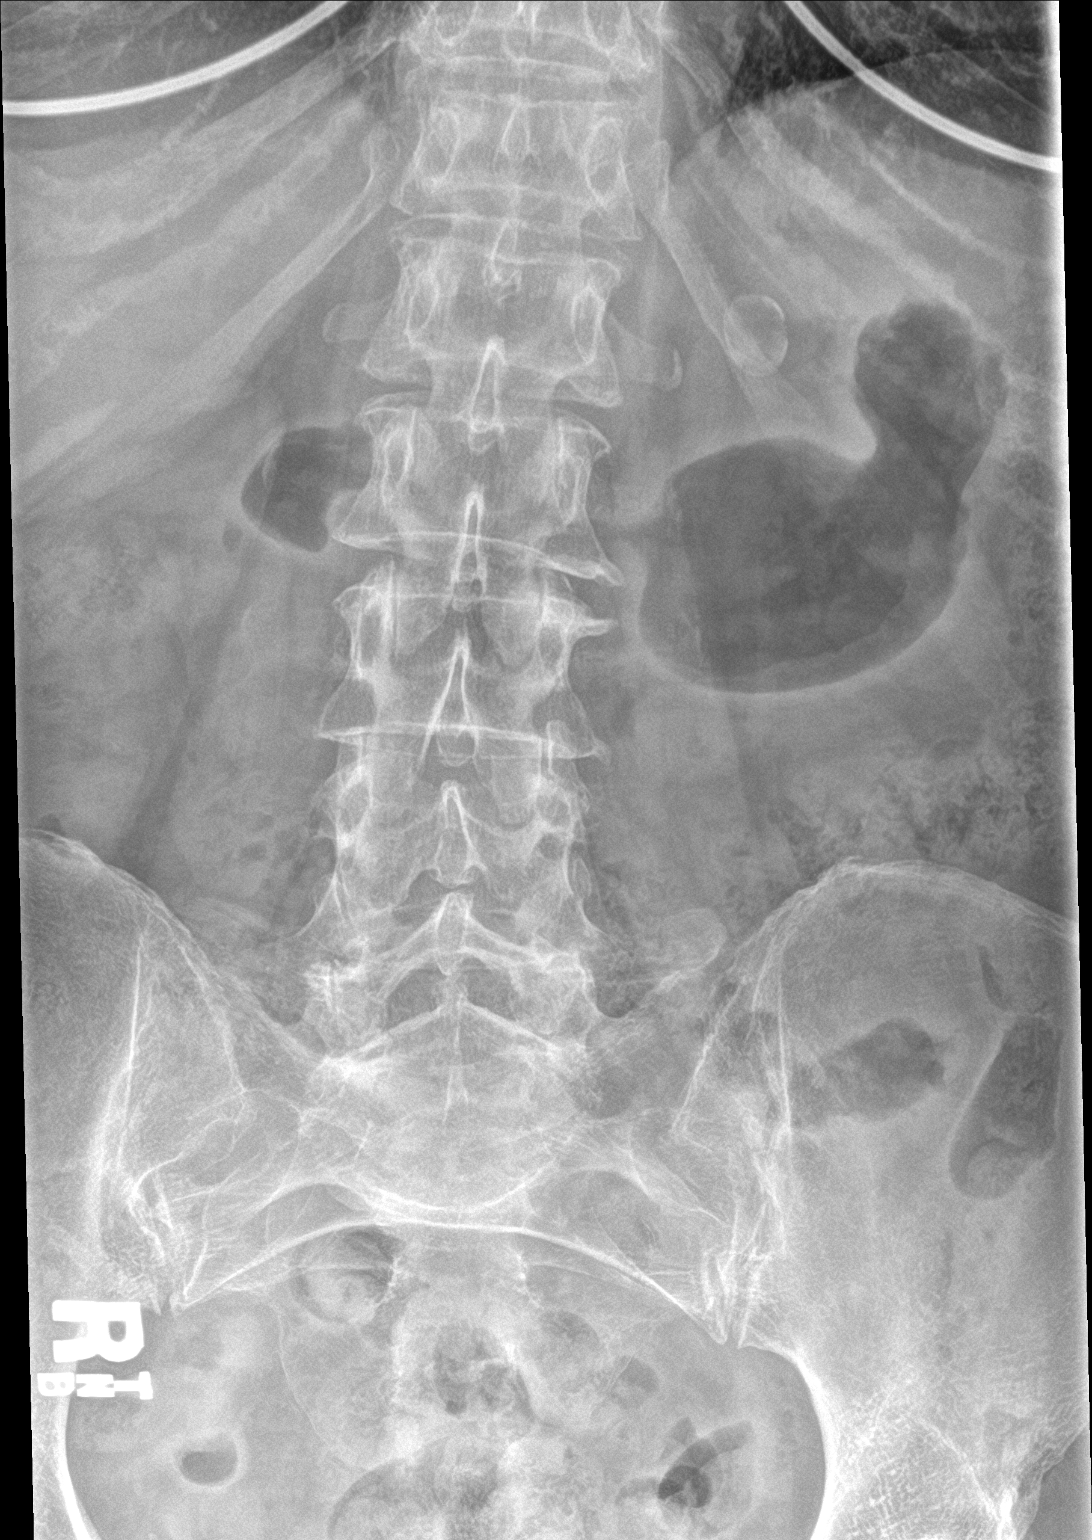

[l-spine lat]
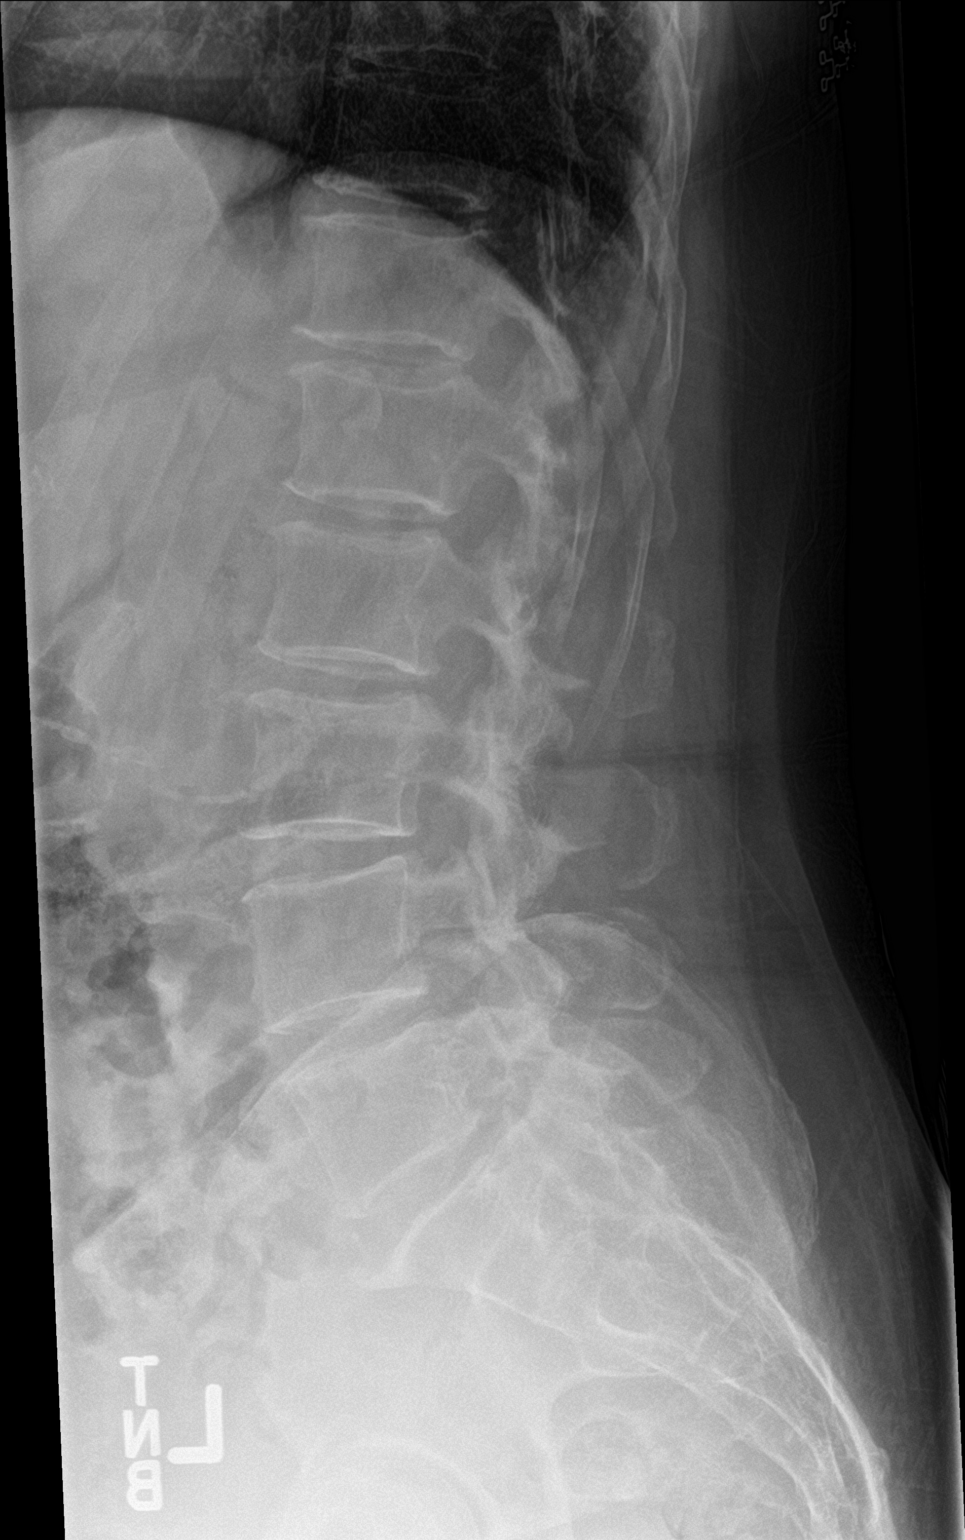

[l-spine spot]
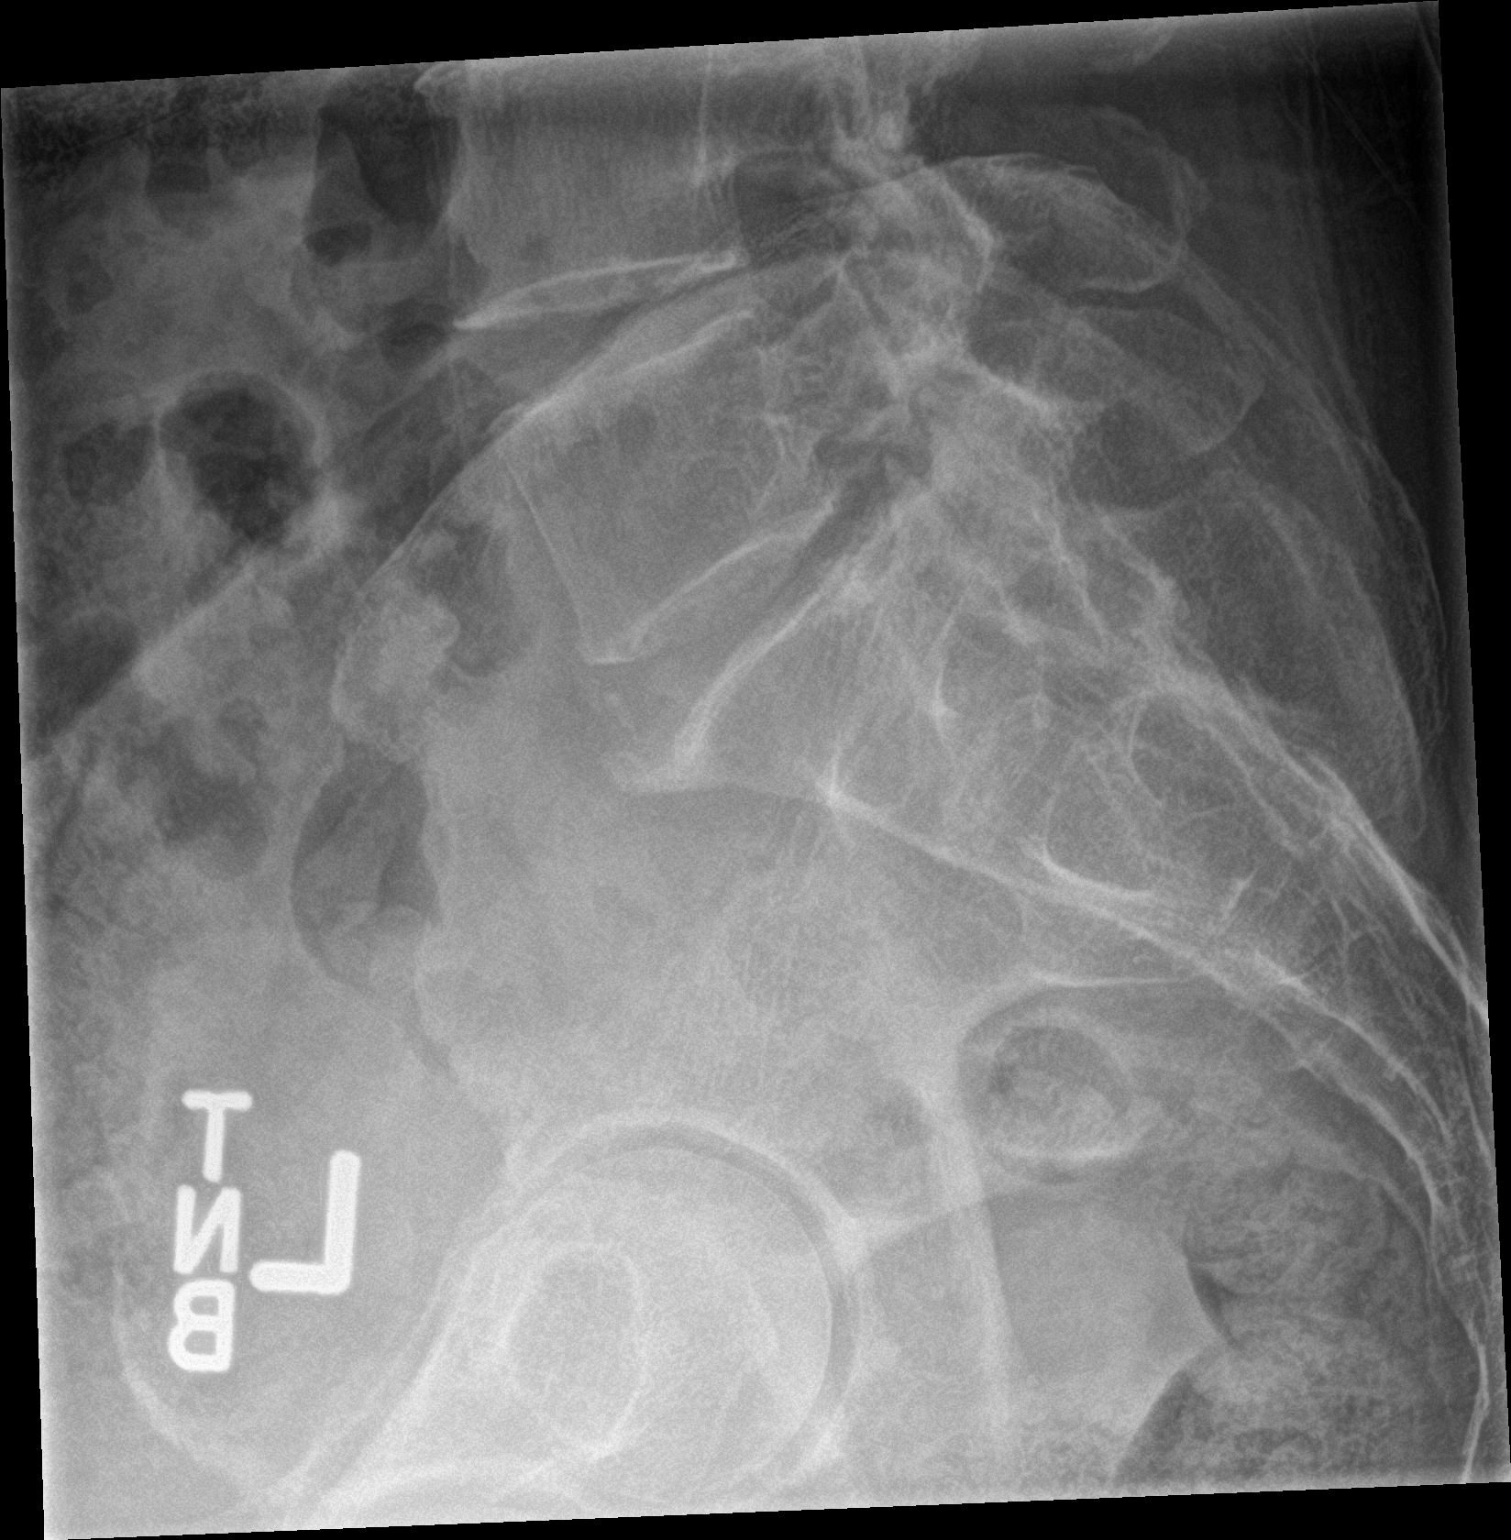

[3 of 3 positions shown; findings below may reference images not displayed]

FINDINGS: No pneumothorax or pleural effusion is noted. Disc spaces are
well-maintained. Mild osteophyte formation is seen to the left of
L2-3.
IMPRESSION: Mild degenerative changes as described above. No acute abnormality
seen in the lumbar spine.

## 2016-04-19 IMAGING — DX DG HIP (WITH OR WITHOUT PELVIS) 2-3V*L*
3 series · 3 of 3 positions shown · non-contrast
Comparison: None.

CLINICAL DATA: Left hip pain without known injury.

EXAM:
DG HIP (WITH OR WITHOUT PELVIS) 2-3V LEFT

[pelvis ap]
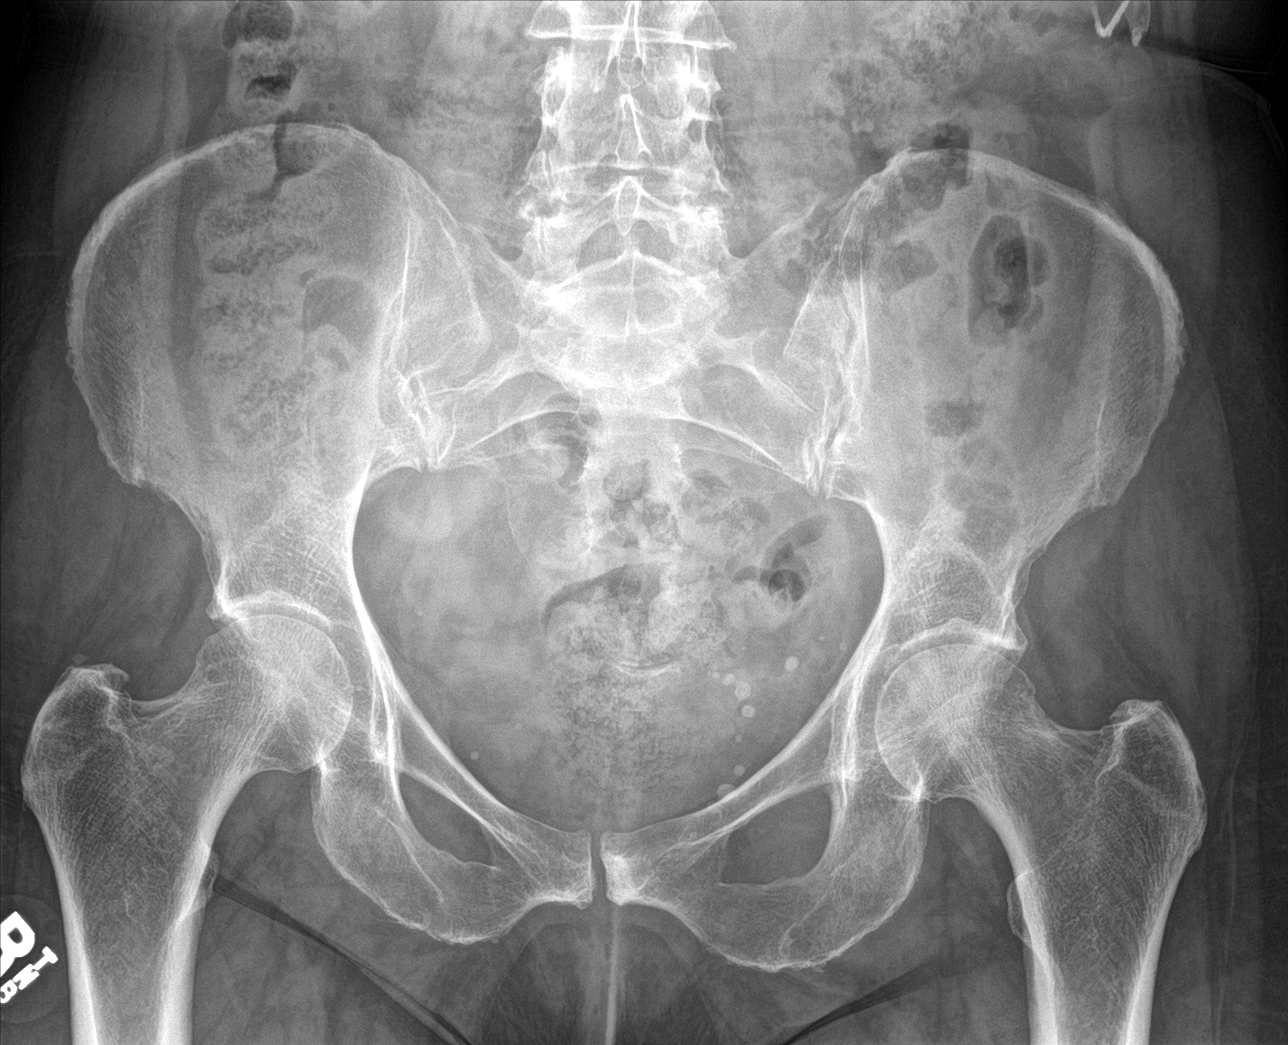

[hip ap]
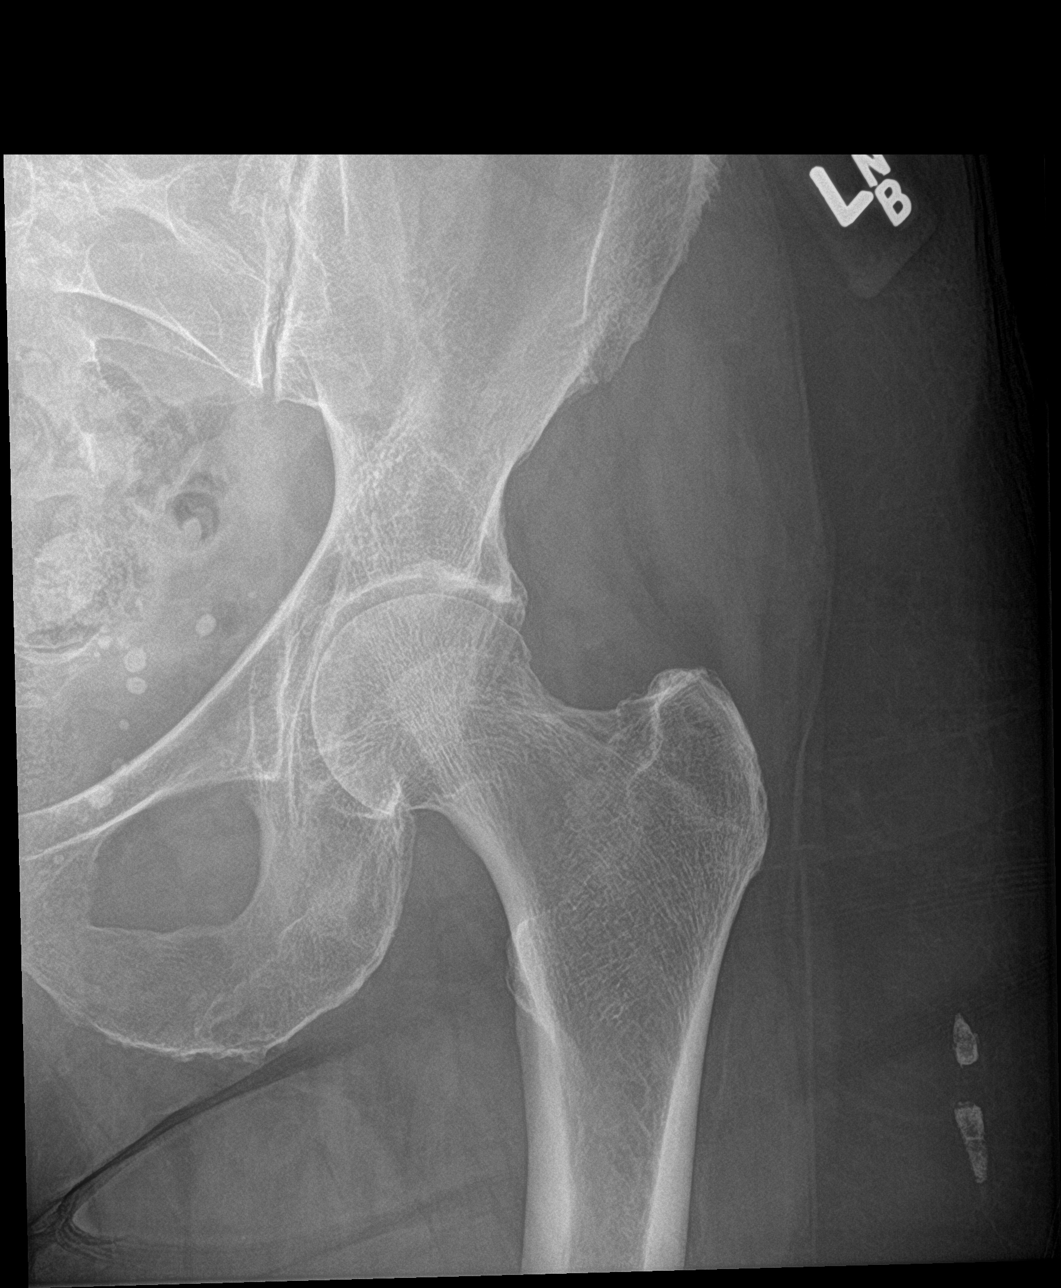

[hip lat]
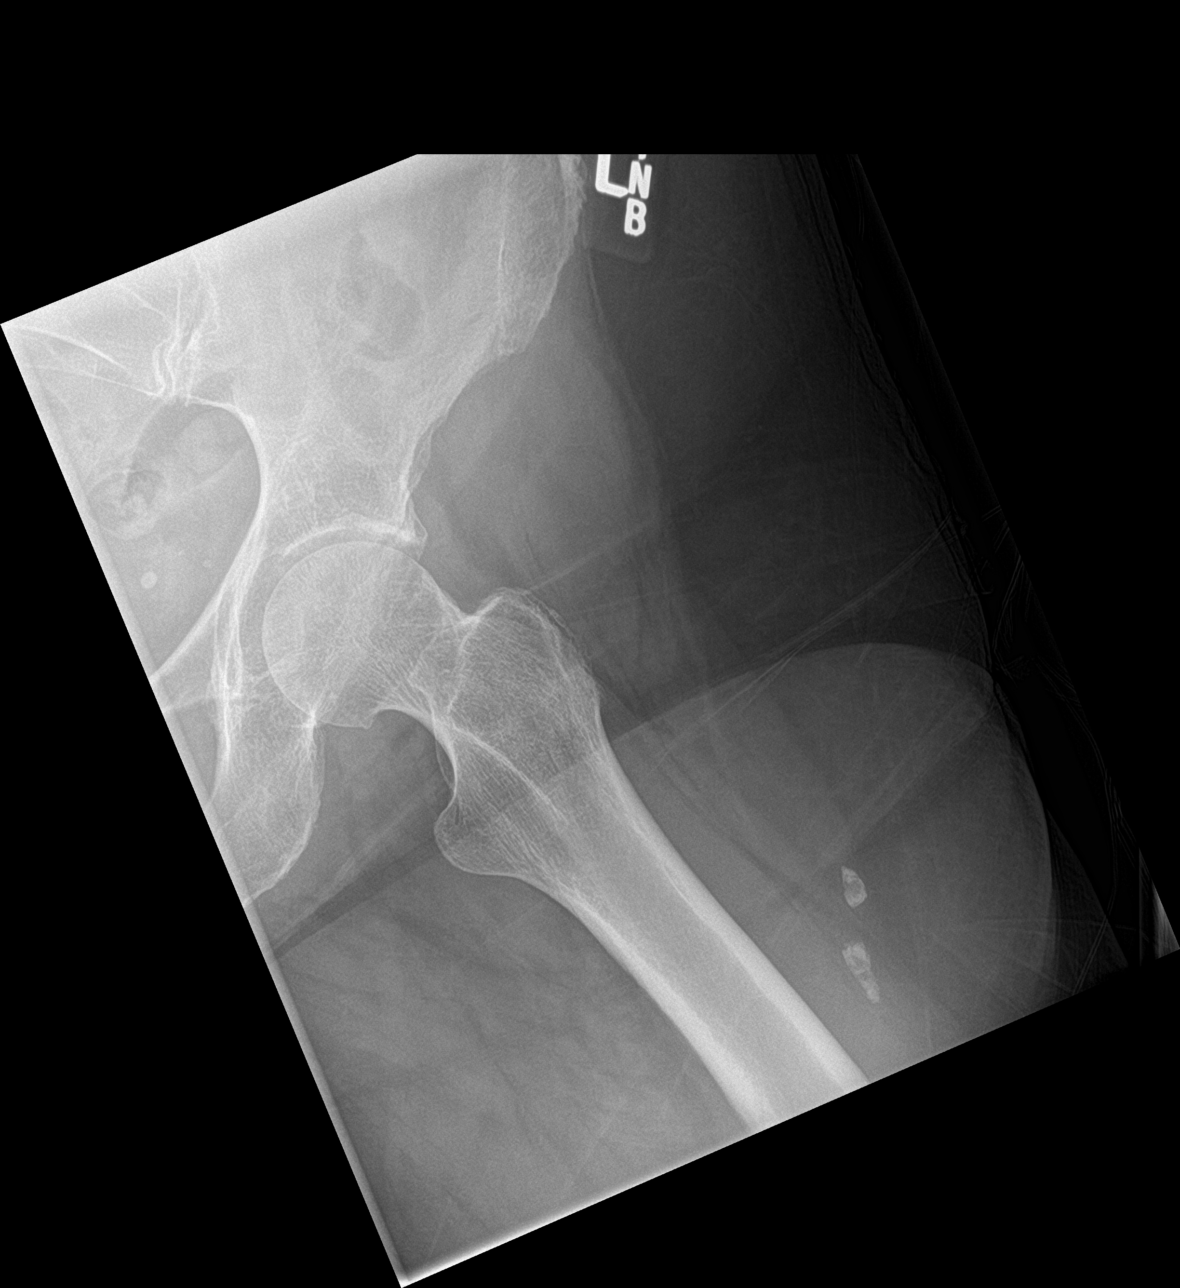

[3 of 3 positions shown; findings below may reference images not displayed]

FINDINGS: There is no evidence of hip fracture or dislocation. There is no
evidence of arthropathy or other focal bone abnormality.
IMPRESSION: Normal left hip.

## 2016-04-19 NOTE — Progress Notes (Signed)
Patient ID: Renee Benitez, female   DOB: [birth year], 66 y.o.   MRN: [id number] History of Present Illness: Renee Benitez is a 66 year old white female, married in for a well woman gyn exam.She has retired. PCP is Dr Sudie Bailey, and she sees Dr Diona Browner for heart.   Current Medications, Allergies, Past Medical History, Past Surgical History, Family History and Social History were reviewed in [employer] record.     Review of Systems: Patient denies any headaches, hearing loss, fatigue, blurred vision, shortness of breath, chest pain, abdominal pain, problems with bowel movements, urination, or intercourse. No mood swings. Has pain in low back and left hip, took prednisone in Fall and felt better,(no known injury).Has irregular heart beats at times.   Physical Exam:BP (!) 142/76 (BP Location: Left Arm, Patient Position: Sitting, Cuff Size: Normal)   Pulse 92   Ht 5' 6.5" (1.689 m)   Wt 174 lb (78.9 kg)   BMI 27.66 kg/m  General:  Well developed, well nourished, no acute distress Skin:  Warm and dry Neck:  Midline trachea, normal thyroid, good ROM, no lymphadenopathy,no carotid bruits heard Lungs; Clear to auscultation bilaterally Breast:  No dominant palpable mass, retraction, or nipple discharge Cardiovascular: Regular rate and rhythm Abdomen:  Soft, non tender, no hepatosplenomegaly Pelvic:  External genitalia is normal in appearance, no lesions.  The vagina is normal in appearance. Urethra has no lesions or masses. The cervix and uterus are absent. No adnexal masses or tenderness noted.Bladder is non tender, no masses felt. Rectal: Good sphincter tone, no polyps, or hemorrhoids felt.  Hemoccult negative. Extremities/musculoskeletal:  No swelling or varicosities noted, no clubbing or cyanosis, no point tenderness in back. Psych:  No mood changes, alert and cooperative,seems happy PHQ 2 score 0.  Impression: 1. Well female exam with routine gynecological exam   2.  Bilateral low back pain without sciatica, unspecified chronicity   3. Left hip pain   4. Current use of estrogen therapy       Plan: Continue estrogen patches for now, may wean off later Mammogram yearly Labs with PCP Get lumbar and left hip xray Physical in 1 year Colonoscopy per GI

## 2016-06-22 NOTE — Progress Notes (Signed)
Cardiology Office Note  Date: 06/23/2016   ID: Renee Benitez, DOB 31-Mar-1951, MRN [id number]  PCP: Milana Obey, MD  Primary Cardiologist: Nona Dell, MD   Chief Complaint  Patient presents with  . PAF    History of Present Illness: Renee Benitez is a 66 y.o. female last seen in March 2017. She presents for a routine follow-up visit. States that she has had a lot of stress in her life, and during this time has had increasing palpitations. She states that they are starting to settle down, but were very frequent for a period of time. She remains on the same regimen including long-acting and intermittent short acting Cardizem as noted below.  CHADSVASC score is 2. We have discussed anticoagulation for stroke prophylaxis and she has preferred to stay on aspirin so far. This remains the case today.  I personally reviewed her ECG today which shows normal sinus rhythm.  Past Medical History:  Diagnosis Date  . Atrial fibrillation ([facility])   . Current use of estrogen therapy 01/31/2013  . Hypertension   . Macular degeneration   . Menopausal syndrome   . Right ovarian cyst 01/25/2014    Past Surgical History:  Procedure Laterality Date  . ABDOMINAL HYSTERECTOMY    . APPENDECTOMY    . CATARACT EXTRACTION W/ INTRAOCULAR LENS  IMPLANT, BILATERAL    . COLONOSCOPY  12/04/03   Friable anal canal otherwise normal rectum and colon  . COLONOSCOPY N/A 03/19/2013   Procedure: COLONOSCOPY;  Surgeon: [name], MD;  Location: [facility];  Service: Endoscopy;  Laterality: N/A;  1:15  . TONSILECTOMY, ADENOIDECTOMY, BILATERAL MYRINGOTOMY AND TUBES      Current Outpatient Prescriptions  Medication Sig Dispense Refill  . aspirin 81 MG tablet Take 81 mg by mouth 2 (two) times daily.     . calcium carbonate (OS-CAL) 600 MG TABS Take 600 mg by mouth daily.      Marland Kitchen diltiazem (CARDIZEM CD) 180 MG 24 hr capsule TAKE 1 CAPSULE BY MOUTH  DAILY 120 capsule 3  . diltiazem (CARDIZEM) 30  MG tablet Take 1 tablet (30 mg total) by mouth every 6 (six) hours as needed. Take 1 to 2 tablets every 6 hrs. as needed for palpitations 30 tablet 6  . ergocalciferol (VITAMIN D2) 50000 UNITS capsule Take 50,000 Units by mouth once a week.      . estradiol (VIVELLE-DOT) 0.1 MG/24HR patch Place 1 patch (0.1 mg total) onto the skin 2 (two) times a week. 24 patch 0  . Magnesium 250 MG TABS Take by mouth daily.    . Multiple Vitamins-Minerals (MULTIVITAMIN WITH MINERALS) tablet Take 1 tablet by mouth daily.      . Multiple Vitamins-Minerals (PRESERVISION AREDS 2) CAPS Take 1 capsule by mouth 2 (two) times daily.    . Omega-3 Fatty Acids (FISH OIL PO) Take by mouth daily.     No current facility-administered medications for this visit.    Allergies:  Patient has no known allergies.   Social History: The patient  reports that she has never smoked. She has never used smokeless tobacco. She reports that she drinks alcohol. She reports that she does not use drugs.   ROS:  Please see the history of present illness. Otherwise, complete review of systems is positive for psychosocial stressors.  All other systems are reviewed and negative.   Physical Exam: VS:  BP 116/76   Pulse 75   Ht 5\' 6"  (1.676 m)  Wt 175 lb 12.8 oz (79.7 kg)   SpO2 98%   BMI 28.37 kg/m , BMI Body mass index is 28.37 kg/m.  Wt Readings from Last 3 Encounters:  06/23/16 175 lb 12.8 oz (79.7 kg)  04/19/16 174 lb (78.9 kg)  06/20/15 181 lb 3.2 oz (82.2 kg)    Appears comfortable at rest.  HEENT: Conjunctiva and lids normal, oropharynx clear.  Neck: Supple, no elevated JVP or bruits.  Lungs: Clear to auscultation, nontender.  Cardiac: Regular rate and rhythm, no S3.  Abdomen: Soft, nontender, bowel sounds present.  Skin: Warm and dry. Musculoskeletal: No kyphosis. Neuropsychiatric: Alert and oriented 3, affect appropriate.  ECG: I personally reviewed the tracing from 06/20/2015 which shows sinus rhythm.  Other  Studies Reviewed Today:  Exercise echocardiogram December 2014: Negative for ischemia, low risk Duke treadmill score of 8, no atrial fibrillation noted.  Assessment and Plan:  1. Paroxysmal atrial fibrillation, CHADSVASC score is 2. She continues to prefer aspirin rather than anticoagulation. We will maintain both Cardizem CD with intermittent use short acting Cardizem for palpitations, also provide a 7 day event monitor to make sure she is not having very frequent atrial fibrillation that may respond better to antiarrhythmic therapy. She does state that her symptoms seem to be improving.  2. Essential hypertension, blood pressure is normal today.  Current medicines were reviewed with the patient today.   Orders Placed This Encounter  Procedures  . EKG 12-Lead    Disposition: Follow-up in 6 months.  Signed, [name], MD, [facility] 06/23/2016 10:39 AM    [facility] [address] [place], [place], [place] [address] Phone: [phone]; Fax: [phone]

## 2016-06-23 ENCOUNTER — Encounter: Payer: Self-pay | Admitting: Cardiology

## 2016-06-23 ENCOUNTER — Ambulatory Visit (INDEPENDENT_AMBULATORY_CARE_PROVIDER_SITE_OTHER): Payer: 59 | Admitting: Cardiology

## 2016-06-23 VITALS — BP 116/76 | HR 75 | Ht 66.0 in | Wt 175.8 lb

## 2016-06-23 DIAGNOSIS — I10 Essential (primary) hypertension: Secondary | ICD-10-CM | POA: Diagnosis not present

## 2016-06-23 DIAGNOSIS — I480 Paroxysmal atrial fibrillation: Secondary | ICD-10-CM | POA: Diagnosis not present

## 2016-06-23 DIAGNOSIS — R002 Palpitations: Secondary | ICD-10-CM

## 2016-06-23 MED ORDER — DILTIAZEM HCL 30 MG PO TABS: 30.0000 mg | ORAL_TABLET | Freq: Four times a day (QID) | ORAL | 6 refills | Status: DC | PRN

## 2016-06-23 MED ORDER — DILTIAZEM HCL ER COATED BEADS 180 MG PO CP24: 180.0000 mg | ORAL_CAPSULE | Freq: Every day | ORAL | 3 refills | Status: DC

## 2016-06-23 NOTE — Patient Instructions (Addendum)
Medication Instructions:  Continue all current medications.  Labwork: none  Testing/Procedures:  Your physician has recommended that you wear a 7 day event monitor. Event monitors are medical devices that record the heart's electrical activity. Doctors most often us these monitors to diagnose arrhythmias. Arrhythmias are problems with the speed or rhythm of the heartbeat. The monitor is a small, portable device. You can wear one while you do your normal daily activities. This is usually used to diagnose what is causing palpitations/syncope (passing out).  Office will contact with results via phone or letter.    Follow-Up: Your physician wants you to follow up in: 6 months.  You will receive a reminder letter in the mail one-two months in advance.  If you don't receive a letter, please call our office to schedule the follow up appointment   Any Other Special Instructions Will Be Listed Below (If Applicable).  If you need a refill on your cardiac medications before your next appointment, please call your pharmacy.  

## 2016-07-02 ENCOUNTER — Ambulatory Visit (INDEPENDENT_AMBULATORY_CARE_PROVIDER_SITE_OTHER): Payer: 59

## 2016-07-02 DIAGNOSIS — R002 Palpitations: Secondary | ICD-10-CM

## 2016-07-02 DIAGNOSIS — I10 Essential (primary) hypertension: Secondary | ICD-10-CM | POA: Diagnosis not present

## 2016-07-04 ENCOUNTER — Telehealth: Payer: Self-pay | Admitting: Cardiology

## 2016-07-04 NOTE — Telephone Encounter (Signed)
I was contacted by [place] at [facility] monitoring service for atrial fib with rates up to 140's-150's intermittently with baseline a fib around 110. They had called the patient and she was asymptomatic during episode from this morning and this evening.   I called the patient who states that she has had no sensation of palpitations today, no shortness of breath, chest tightness or lightheadedness. She is a little tired. She has been busy today doing things around the house and is currently making dinner without problems. She takes cardizem CD 180 mg for PAF and was told in the past that she can take 1-2 cardizem 30 mg q 6h as needed for palpitations. She has not taken any extra doses since she has not had any perceived symptoms.   I advised her to take at least 1 of the prn diltiazem pills every 6 hours until tomorrow. She will periodically check her heart rate and BP. 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 Reviewed S/S when to proceed to hospital and when she can wait until the morning and report to the office.   Per Dr. Verna Czech note anticoagulation was discussed and she has elected to take aspirin only.   Dr. Wyline Mood, please contact pt with any further instructions.  Berton Bon, AGNP-C 07/04/2016  5:22 PM Pager: [phone]

## 2016-07-05 MED ORDER — FLECAINIDE ACETATE 50 MG PO TABS: 50.0000 mg | ORAL_TABLET | Freq: Two times a day (BID) | ORAL | 1 refills | Status: DC

## 2016-07-05 NOTE — Addendum Note (Signed)
Addended by: Burman Nieves T on: 07/05/2016 01:21 PM   Modules accepted: Orders

## 2016-07-05 NOTE — Telephone Encounter (Signed)
I will forward to Dr Diona Browner, patient of his   Renee Benitez

## 2016-07-05 NOTE — Telephone Encounter (Signed)
Pt agreeable to flecainide - will continue Cardizem as directed - f/u appt made for May

## 2016-07-05 NOTE — Telephone Encounter (Signed)
Reviewed. I just saw her recently in the office. I am concerned that she is having increased frequency of breakthrough atrial fibrillation, may not even be feeling this in terms of symptoms. Would suggest starting flecainide 50 mg twice daily as an antiarrhythmic and then have her follow-up in the office over the next 4-6 weeks to see how she is doing. Otherwise continue Cardizem as directed. She may also want to get give further consideration to anticoagulant therapy instead of aspirin for stroke prophylaxis.

## 2016-07-14 ENCOUNTER — Telehealth: Payer: Self-pay

## 2016-07-14 NOTE — Telephone Encounter (Signed)
Patient notified. Patient stated she is feeling much better since starting new medicine. Patient coming Monday at 1030 am for EKG. Routed to PCP

## 2016-07-14 NOTE — Telephone Encounter (Signed)
-----   Message from [name], MD sent at 07/14/2016  9:10 AM EDT ----- Results reviewed. She had a lot of atrial fibrillation and some atrial flutter during monitoring. We have started Flecainide. See how she is doing - may need to come by office for ECG just to make sure rhythm has settled down. A copy of this test should be forwarded to Milana Obey, MD.

## 2016-07-19 ENCOUNTER — Ambulatory Visit (INDEPENDENT_AMBULATORY_CARE_PROVIDER_SITE_OTHER): Payer: 59 | Admitting: *Deleted

## 2016-07-19 DIAGNOSIS — I480 Paroxysmal atrial fibrillation: Secondary | ICD-10-CM

## 2016-07-19 NOTE — Progress Notes (Signed)
Noted. I reviewed the ECG which shows atrial fibrillation. Patient reports recent cold symptoms and felt palpitations with this, had been feeling better previously. Plan to continue current medications and keep office follow up scheduled. Asked that she check her heart rate daily to make sure that there are periods of normality and that she is returning to sinus rhythm on medications.

## 2016-07-19 NOTE — Progress Notes (Signed)
Patient in office for EKG.  Stated she had been feeling better on the Flecainide until recently.  Currently has a cold / cough & could tell heart rate had been irregular.  Stated that she has only been taking Claritin & Delsym for these symptoms.

## 2016-07-19 NOTE — Progress Notes (Signed)
Patient aware.

## 2016-08-09 NOTE — Progress Notes (Signed)
Cardiology Office Note  Date: 08/10/2016   ID: Renee Benitez, DOB 01/23/1951, MRN [id number]  PCP: Milana Obey, MD  Primary Cardiologist: Nona Dell, MD   Chief Complaint  Patient presents with  . Atrial Fibrillation    History of Present Illness: Renee Benitez is a 66 y.o. female last seen in March. Since she was last seen, flecainide has been initiated to hopefully provide better rhythm suppression with recurring PAF. She had a follow-up cardiac monitor that showed fairly frequent atrial fibrillation/flutter. She returns today for follow up, feels better, but is noted to be in atypical atrial flutter today. Pursley reviewed her ECG which shows probable atypical atrial flutter 147 bpm.  She reports compliance with her medications. We discussed increasing flecainide dose further. She also uses additional Cardizem tablets were heart rate is up. It does sound like it is coming in and out of rhythm still based on her description of heart rates at home.  CHADSVASC score is 2. She has not been anticoagulated up to this point, discussed on prior occasions. We did go over this again today, I think that she is going to need to be on anticoagulation, particularly if we need to make any efforts at cardioversion ultimately. She wanted to think about this more, we will obtain baseline lab work.  Past Medical History:  Diagnosis Date  . Atrial fibrillation (HCC)   . Current use of estrogen therapy 01/31/2013  . Hypertension   . Macular degeneration   . Menopausal syndrome   . Right ovarian cyst 01/25/2014    Past Surgical History:  Procedure Laterality Date  . ABDOMINAL HYSTERECTOMY    . APPENDECTOMY    . CATARACT EXTRACTION W/ INTRAOCULAR LENS  IMPLANT, BILATERAL    . COLONOSCOPY  12/04/03   Friable anal canal otherwise normal rectum and colon  . COLONOSCOPY N/A 03/19/2013   Procedure: COLONOSCOPY;  Surgeon: [name], MD;  Location: [facility];  Service: Endoscopy;   Laterality: N/A;  1:15  . TONSILECTOMY, ADENOIDECTOMY, BILATERAL MYRINGOTOMY AND TUBES      Current Outpatient Prescriptions  Medication Sig Dispense Refill  . aspirin 81 MG tablet Take 81 mg by mouth 2 (two) times daily.     . calcium carbonate (OS-CAL) 600 MG TABS Take 600 mg by mouth daily.      Marland Kitchen diltiazem (CARDIZEM CD) 180 MG 24 hr capsule Take 1 capsule (180 mg total) by mouth daily. 120 capsule 3  . diltiazem (CARDIZEM) 30 MG tablet Take 1 tablet (30 mg total) by mouth every 6 (six) hours as needed. Take 1 to 2 tablets every 6 hrs. as needed for palpitations 30 tablet 6  . ergocalciferol (VITAMIN D2) 50000 UNITS capsule Take 50,000 Units by mouth once a week.      . estradiol (VIVELLE-DOT) 0.1 MG/24HR patch Place 1 patch (0.1 mg total) onto the skin 2 (two) times a week. 24 patch 0  . Magnesium 250 MG TABS Take by mouth daily.    . Multiple Vitamins-Minerals (MULTIVITAMIN WITH MINERALS) tablet Take 1 tablet by mouth daily.      . Multiple Vitamins-Minerals (PRESERVISION AREDS 2) CAPS Take 1 capsule by mouth 2 (two) times daily.    . Omega-3 Fatty Acids (FISH OIL PO) Take by mouth daily.    . flecainide (TAMBOCOR) 100 MG tablet Take 1 tablet (100 mg total) by mouth 2 (two) times daily. 180 tablet 3   No current facility-administered medications for this visit.  Allergies:  Patient has no known allergies.   Social History: The patient  reports that she has never smoked. She has never used smokeless tobacco. She reports that she drinks alcohol. She reports that she does not use drugs.   ROS:  Please see the history of present illness. Otherwise, complete review of systems is positive for none.  All other systems are reviewed and negative.   Physical Exam: VS:  BP 110/82   Pulse (!) 146   Ht 5\' 6"  (1.676 m)   Wt 178 lb (80.7 kg)   SpO2 98%   BMI 28.73 kg/m , BMI Body mass index is 28.73 kg/m.  Wt Readings from Last 3 Encounters:  08/10/16 178 lb (80.7 kg)  06/23/16 175 lb  12.8 oz (79.7 kg)  04/19/16 174 lb (78.9 kg)    Appears comfortable at rest.  HEENT: Conjunctiva and lids normal, oropharynx clear.  Neck: Supple, no elevated JVP or bruits.  Lungs: Clear to auscultation, nontender.  Cardiac: Regular rate and rhythm, no S3.  Abdomen: Soft, nontender, bowel sounds present.  Skin: Warm and dry. Musculoskeletal: No kyphosis. Neuropsychiatric: Alert and oriented 3, affect appropriate.  ECG: I personally reviewed the tracing from 07/19/2016 which showed atrial fibrillation with RVR.  Other Studies Reviewed Today:  Exercise echocardiogram December 2014: Negative for ischemia, low risk Duke treadmill score of 8, no atrial fibrillation noted.  Assessment and Plan:  1. Paroxysmal to persistent atrial fibrillation and atypical atrial flutter. It does sound like she is coming in and out of rhythm based on discussion today. Plan to increase flecainide to 100 mg twice daily, otherwise continue Cardizem CD with intermittent short acting Cardizem as needed. I did discuss anticoagulation with her again, she may agree to Eliquis ultimately. We will obtain baseline CBC and BMET.  2. Essential hypertension, blood pressure is well controlled today.  Current medicines were reviewed with the patient today.   Orders Placed This Encounter  Procedures  . CBC  . Basic Metabolic Panel (BMET)  . EKG 12-Lead    Disposition: Follow-up within a month.  Signed, [name], MD, [facility] 08/10/2016 11:10 AM    [facility] at [facility] [address] [place], [place], [place] [address] Phone: [phone]; Fax: [phone]

## 2016-08-10 ENCOUNTER — Ambulatory Visit (INDEPENDENT_AMBULATORY_CARE_PROVIDER_SITE_OTHER): Payer: 59 | Admitting: Cardiology

## 2016-08-10 ENCOUNTER — Encounter: Payer: Self-pay | Admitting: Cardiology

## 2016-08-10 VITALS — BP 110/82 | HR 146 | Ht 66.0 in | Wt 178.0 lb

## 2016-08-10 DIAGNOSIS — I480 Paroxysmal atrial fibrillation: Secondary | ICD-10-CM

## 2016-08-10 DIAGNOSIS — I484 Atypical atrial flutter: Secondary | ICD-10-CM | POA: Diagnosis not present

## 2016-08-10 DIAGNOSIS — I10 Essential (primary) hypertension: Secondary | ICD-10-CM | POA: Diagnosis not present

## 2016-08-10 MED ORDER — FLECAINIDE ACETATE 100 MG PO TABS: 100.0000 mg | ORAL_TABLET | Freq: Two times a day (BID) | ORAL | 3 refills | Status: DC

## 2016-08-10 NOTE — Patient Instructions (Signed)
Medication Instructions:  Your physician has recommended you make the following change in your medication: INCREASE FLECANIDE TO 100 MG TWICE DAILY CONTINUE ALL OTHER MEDICATIONS AS PRECRIBED  Labwork: CBC, BMET  Testing/Procedures: NONE  Follow-Up: Your physician recommends that you schedule a follow-up appointment in: 1 MONTH WITH DR. MCDOWELL  Any Other Special Instructions Will Be Listed Below (If Applicable).  If you need a refill on your cardiac medications before your next appointment, please call your pharmacy.

## 2016-08-24 ENCOUNTER — Telehealth: Payer: Self-pay

## 2016-08-24 NOTE — Telephone Encounter (Signed)
Patent notified. Patient stated Dr. Osborne Casco office already aware of results.

## 2016-08-24 NOTE — Telephone Encounter (Signed)
-----   Message from [name], MD sent at 08/24/2016  8:51 AM EDT ----- Results reviewed. Renal function and potassium normal. Hemoglobin and platelets normal. A copy of this test should be forwarded to Gareth Morgan, MD.

## 2016-08-31 ENCOUNTER — Other Ambulatory Visit: Payer: Self-pay | Admitting: Adult Health

## 2016-09-08 NOTE — Progress Notes (Signed)
Cardiology Office Note  Date: 09/09/2016   ID: Renee Benitez, DOB [birth year], MRN [id number]  PCP: Gareth Morgan, MD  Primary Cardiologist: Nona Dell, MD   Chief Complaint  Patient presents with  . PAF    History of Present Illness: Renee Benitez is a 66 y.o. female last seen in early May. She presents today for a follow-up visit. Unfortunately, she remains out of rhythm, looks to be in persistent atrial flutter today, heart rate around 119 by ECG. She has been compliant with flecainide and also Cardizem CD. Today I reiterated my concerns about initiating anticoagulation for stroke prophylaxis and at this point we would also move toward a cardioversion. She states that she has considered the matter and is willing to start anticoagulation. CHADSVASC score is 2.   Recent follow-up lab work is outlined below. Creatinine and hemoglobin are normal.  We discussed scheduling a TEE guided cardioversion for next week after she has initiated Eliquis this week. She will stop aspirin when she starts Eliquis.  Past Medical History:  Diagnosis Date  . Atrial fibrillation (HCC)   . Current use of estrogen therapy 01/31/2013  . Hypertension   . Macular degeneration   . Menopausal syndrome   . Right ovarian cyst 01/25/2014    Past Surgical History:  Procedure Laterality Date  . ABDOMINAL HYSTERECTOMY    . APPENDECTOMY    . CATARACT EXTRACTION W/ INTRAOCULAR LENS  IMPLANT, BILATERAL    . COLONOSCOPY  12/04/03   Friable anal canal otherwise normal rectum and colon  . COLONOSCOPY N/A 03/19/2013   Procedure: COLONOSCOPY;  Surgeon: [name], MD;  Location: [facility];  Service: Endoscopy;  Laterality: N/A;  1:15  . TONSILECTOMY, ADENOIDECTOMY, BILATERAL MYRINGOTOMY AND TUBES      Current Outpatient Prescriptions  Medication Sig Dispense Refill  . aspirin 81 MG tablet Take 81 mg by mouth 2 (two) times daily.     . calcium carbonate (OS-CAL) 600 MG TABS Take 600 mg by  mouth daily.      Marland Kitchen diltiazem (CARDIZEM CD) 180 MG 24 hr capsule Take 1 capsule (180 mg total) by mouth daily. 120 capsule 3  . diltiazem (CARDIZEM) 30 MG tablet Take 1 tablet (30 mg total) by mouth every 6 (six) hours as needed. Take 1 to 2 tablets every 6 hrs. as needed for palpitations 30 tablet 6  . ergocalciferol (VITAMIN D2) 50000 UNITS capsule Take 50,000 Units by mouth once a week.      . estradiol (VIVELLE-DOT) 0.1 MG/24HR patch PLACE ONE PATCH ONTO THE SKIN TWO TIMES A WEEK 24 patch 4  . flecainide (TAMBOCOR) 100 MG tablet Take 1 tablet (100 mg total) by mouth 2 (two) times daily. 180 tablet 3  . Magnesium 250 MG TABS Take by mouth daily.    . Multiple Vitamins-Minerals (MULTIVITAMIN WITH MINERALS) tablet Take 1 tablet by mouth daily.      . Multiple Vitamins-Minerals (PRESERVISION AREDS 2) CAPS Take 1 capsule by mouth 2 (two) times daily.    . Omega-3 Fatty Acids (FISH OIL PO) Take by mouth daily.     No current facility-administered medications for this visit.    Allergies:  Patient has no known allergies.   Social History: The patient  reports that she has never smoked. She has never used smokeless tobacco. She reports that she drinks alcohol. She reports that she does not use drugs.   Family History: The patient's family history includes Arrhythmia in her sister;  Cancer in her father; Colon cancer in her maternal grandmother; Coronary artery disease in her father; Hypertension in her sister and son.   ROS:  Please see the history of present illness. Otherwise, complete review of systems is positive for dyspnea on exertion.  All other systems are reviewed and negative.   Physical Exam: VS:  BP 124/82   Pulse (!) 115   Ht 5\' 6"  (1.676 m)   Wt 182 lb 12.8 oz (82.9 kg)   SpO2 97%   BMI 29.50 kg/m , BMI Body mass index is 29.5 kg/m.  Wt Readings from Last 3 Encounters:  09/09/16 182 lb 12.8 oz (82.9 kg)  08/10/16 178 lb (80.7 kg)  06/23/16 175 lb 12.8 oz (79.7 kg)      Appears comfortable at rest.  HEENT: Conjunctiva and lids normal, oropharynx clear.  Neck: Supple, no elevated JVP or bruits.  Lungs: Clear to auscultation, nontender.  Cardiac: Irregularly irregular, no S3.  Abdomen: Soft, nontender, bowel sounds present.  Skin: Warm and dry. Musculoskeletal: No kyphosis. Neuropsychiatric: Alert and oriented 3, affect appropriate.  ECG: I personally reviewed the tracing from 08/10/2016 which showed probable atypical atrial flutter with 2:1 block and RVR.  Recent Labwork:  May 2018: BUN 14, creatinine 0.77, potassium 4.4, hemoglobin 15.3, platelets 250  Other Studies Reviewed Today:  Exercise echocardiogram 03/28/2013: Study Conclusions  - Stress ECG conclusions: The ECG during stress did not reveal any diagnostic ST-T abnormalities, nor were there any arrhythmias during stress. During recovery, multiple ventricular couplets and triplets were noted. Duke scoring: exercise time of 7.31min; maximum ST deviation of 0mm; no angina; resulting score is 8. This score predicts a low risk of cardiac events. - Staged echo: Resting left ventricular systolic function was normal, EF 55-60%. With exercise, there was normal augmentation of all wall segments, with hyperdynamic contraction seen. No evidence of inducible ischemia.  Assessment and Plan:  1. Persistent atrial fibrillation/flutter. Patient continues on flecainide and Cardizem CD. With CHASDSVASC score of 2, we are moving ahead with anticoagulation, she has agreed to start Eliquis 5 mg twice daily, aspirin will be stopped. We will schedule a TEE guided cardioversion for next week, I am hopeful that she will be able to maintain sinus rhythm on flecainide thereafter. If not, can consider other options such as EP referral for ablation.  2. Essential hypertension, systolic pressure 120 today. She is on Cardizem CD daily.  Current medicines were reviewed with the patient  today.   Orders Placed This Encounter  Procedures  . EKG 12-Lead    Disposition: Follow-up after cardioversion.  Signed, [name], MD, [facility] 09/09/2016 9:45 AM    [facility] at [facility] [address] [place], [place], [place] [address] Phone: [phone]; Fax: [phone]

## 2016-09-09 ENCOUNTER — Telehealth: Payer: Self-pay | Admitting: Cardiology

## 2016-09-09 ENCOUNTER — Ambulatory Visit (INDEPENDENT_AMBULATORY_CARE_PROVIDER_SITE_OTHER): Payer: 59 | Admitting: Cardiology

## 2016-09-09 ENCOUNTER — Encounter: Payer: Self-pay | Admitting: Cardiology

## 2016-09-09 ENCOUNTER — Other Ambulatory Visit: Payer: Self-pay | Admitting: Cardiology

## 2016-09-09 VITALS — BP 124/82 | HR 115 | Ht 66.0 in | Wt 182.8 lb

## 2016-09-09 DIAGNOSIS — I4819 Other persistent atrial fibrillation: Secondary | ICD-10-CM

## 2016-09-09 DIAGNOSIS — I481 Persistent atrial fibrillation: Secondary | ICD-10-CM | POA: Diagnosis not present

## 2016-09-09 DIAGNOSIS — I10 Essential (primary) hypertension: Secondary | ICD-10-CM

## 2016-09-09 MED ORDER — APIXABAN 5 MG PO TABS: 5.0000 mg | ORAL_TABLET | Freq: Two times a day (BID) | ORAL | 0 refills | Status: DC

## 2016-09-09 NOTE — Patient Instructions (Signed)
Renee Benitez  09/09/2016     @PREFPERIOPPHARMACY @   Your procedure is scheduled on  09/14/2016   Report to [facility] at  1000  A.M.  Call this number if you have problems the morning of surgery:  [phone]   Remember:  Do not eat food or drink liquids after midnight.  Take these medicines the morning of surgery with A SIP OF WATER  Cardiazem, flecanide.   Do not wear jewelry, make-up or nail polish.  Do not wear lotions, powders, or perfumes, or deoderant.  Do not shave 48 hours prior to surgery.  Men may shave face and neck.  Do not bring valuables to the hospital.  [facility] is not responsible for any belongings or valuables.  Contacts, dentures or bridgework may not be worn into surgery.  Leave your suitcase in the car.  After surgery it may be brought to your room.  For patients admitted to the hospital, discharge time will be determined by your treatment team.  Patients discharged the day of surgery will not be allowed to drive home.   Name and phone number of your driver:   family Special instructions:  None  Please read over the following fact sheets that you were given. Anesthesia Post-op Instructions and Care and Recovery After Surgery       Electrical Cardioversion Electrical cardioversion is the delivery of a jolt of electricity to restore a normal rhythm to the heart. A rhythm that is too fast or is not regular keeps the heart from pumping well. In this procedure, sticky patches or metal paddles are placed on the chest to deliver electricity to the heart from a device. This procedure may be done in an emergency if:  There is low or no blood pressure as a result of the heart rhythm.  Normal rhythm must be restored as fast as possible to protect the brain and heart from further damage.  It may save a life.  This procedure may also be done for irregular or fast heart rhythms that are not immediately life-threatening. Tell a health  care provider about:  Any allergies you have.  All medicines you are taking, including vitamins, herbs, eye drops, creams, and over-the-counter medicines.  Any problems you or family members have had with anesthetic medicines.  Any blood disorders you have.  Any surgeries you have had.  Any medical conditions you have.  Whether you are pregnant or may be pregnant. What are the risks? Generally, this is a safe procedure. However, problems may occur, including:  Allergic reactions to medicines.  A blood clot that breaks free and travels to other parts of your body.  The possible return of an abnormal heart rhythm within hours or days after the procedure.  Your heart stopping (cardiac arrest). This is rare.  What happens before the procedure? Medicines  Your health care provider may have you start taking: ? Blood-thinning medicines (anticoagulants) so your blood does not clot as easily. ? Medicines may be given to help stabilize your heart rate and rhythm.  Ask your health care provider about changing or stopping your regular medicines. This is especially important if you are taking diabetes medicines or blood thinners. General instructions  Plan to have someone take you home from the hospital or clinic.  If you will be going home right after the procedure, plan to have someone with you for 24 hours.  Follow instructions from your  health care provider about eating or drinking restrictions. What happens during the procedure?  To lower your risk of infection: ? Your health care team will wash or sanitize their hands. ? Your skin will be washed with soap.  An IV tube will be inserted into one of your veins.  You will be given a medicine to help you relax (sedative).  Sticky patches (electrodes) or metal paddles may be placed on your chest.  An electrical shock will be delivered. The procedure may vary among health care providers and hospitals. What happens after the  procedure?  Your blood pressure, heart rate, breathing rate, and blood oxygen level will be monitored until the medicines you were given have worn off.  Do not drive for 24 hours if you were given a sedative.  Your heart rhythm will be watched to make sure it does not change. This information is not intended to replace advice given to you by your health care provider. Make sure you discuss any questions you have with your health care provider. Document Released: 03/19/2002 Document Revised: 11/26/2015 Document Reviewed: 10/03/2015 Elsevier Interactive Patient Education  2017 [employer].  Electrical Cardioversion, Care After This sheet gives you information about how to care for yourself after your procedure. Your health care provider may also give you more specific instructions. If you have problems or questions, contact your health care provider. What can I expect after the procedure? After the procedure, it is common to have:  Some redness on the skin where the shocks were given.  Follow these instructions at home:  Do not drive for 24 hours if you were given a medicine to help you relax (sedative).  Take over-the-counter and prescription medicines only as told by your health care provider.  Ask your health care provider how to check your pulse. Check it often.  Rest for 48 hours after the procedure or as told by your health care provider.  Avoid or limit your caffeine use as told by your health care provider. Contact a health care provider if:  You feel like your heart is beating too quickly or your pulse is not regular.  You have a serious muscle cramp that does not go away. Get help right away if:  You have discomfort in your chest.  You are dizzy or you feel faint.  You have trouble breathing or you are short of breath.  Your speech is slurred.  You have trouble moving an arm or leg on one side of your body.  Your fingers or toes turn cold or blue. This  information is not intended to replace advice given to you by your health care provider. Make sure you discuss any questions you have with your health care provider. Document Released: 01/17/2013 Document Revised: 10/31/2015 Document Reviewed: 10/03/2015 Elsevier Interactive Patient Education  2018 [employer] Inc.  Transesophageal Echocardiogram Transesophageal echocardiography (TEE) is a picture test of your heart using sound waves. The pictures taken can give very detailed pictures of your heart. This can help your doctor see if there are problems with your heart. TEE can check:  If your heart has blood clots in it.  How well your heart valves are working.  If you have an infection on the inside of your heart.  Some of the major arteries of your heart.  If your heart valve is working after a Psychologist, forensic.  Your heart before a procedure that uses a shock to your heart to get the rhythm back to normal.  What happens  before the procedure?  Do not eat or drink for 6 hours before the procedure or as told by your doctor.  Make plans to have someone drive you home after the procedure. Do not drive yourself home.  An IV tube will be put in your arm. What happens during the procedure?  You will be given a medicine to help you relax (sedative). It will be given through the IV tube.  A numbing medicine will be sprayed or gargled in the back of your throat to help numb it.  The tip of the probe is placed into the back of your mouth. You will be asked to swallow. This helps to pass the probe into your esophagus.  Once the tip of the probe is in the right place, your doctor can take pictures of your heart.  You may feel pressure at the back of your throat. What happens after the procedure?  You will be taken to a recovery area so the sedative can wear off.  Your throat may be sore and scratchy. This will go away slowly over time.  You will go home when you are fully awake and able to swallow  liquids.  You should have someone stay with you for the next 24 hours.  Do not drive or operate machinery for the next 24 hours. This information is not intended to replace advice given to you by your health care provider. Make sure you discuss any questions you have with your health care provider. Document Released: 01/24/2009 Document Revised: 09/04/2015 Document Reviewed: 09/28/2012 Elsevier Interactive Patient Education  2018 [employer] Inc.  Monitored Anesthesia Care Anesthesia is a term that refers to techniques, procedures, and medicines that help a person stay safe and comfortable during a medical procedure. Monitored anesthesia care, or sedation, is one type of anesthesia. Your anesthesia specialist may recommend sedation if you will be having a procedure that does not require you to be unconscious, such as:  Cataract surgery.  A dental procedure.  A biopsy.  A colonoscopy.  During the procedure, you may receive a medicine to help you relax (sedative). There are three levels of sedation:  Mild sedation. At this level, you may feel awake and relaxed. You will be able to follow directions.  Moderate sedation. At this level, you will be sleepy. You may not remember the procedure.  Deep sedation. At this level, you will be asleep. You will not remember the procedure.  The more medicine you are given, the deeper your level of sedation will be. Depending on how you respond to the procedure, the anesthesia specialist may change your level of sedation or the type of anesthesia to fit your needs. An anesthesia specialist will monitor you closely during the procedure. Let your health care provider know about:  Any allergies you have.  All medicines you are taking, including vitamins, herbs, eye drops, creams, and over-the-counter medicines.  Any use of steroids (by mouth or as a cream).  Any problems you or family members have had with sedatives and anesthetic medicines.  Any  blood disorders you have.  Any surgeries you have had.  Any medical conditions you have, such as sleep apnea.  Whether you are pregnant or may be pregnant.  Any use of cigarettes, alcohol, or street drugs. What are the risks? Generally, this is a safe procedure. However, problems may occur, including:  Getting too much medicine (oversedation).  Nausea.  Allergic reaction to medicines.  Trouble breathing. If this happens, a breathing tube may  be used to help with breathing. It will be removed when you are awake and breathing on your own.  Heart trouble.  Lung trouble.  Before the procedure Staying hydrated Follow instructions from your health care provider about hydration, which may include:  Up to 2 hours before the procedure - you may continue to drink clear liquids, such as water, clear fruit juice, black coffee, and plain tea.  Eating and drinking restrictions Follow instructions from your health care provider about eating and drinking, which may include:  8 hours before the procedure - stop eating heavy meals or foods such as meat, fried foods, or fatty foods.  6 hours before the procedure - stop eating light meals or foods, such as toast or cereal.  6 hours before the procedure - stop drinking milk or drinks that contain milk.  2 hours before the procedure - stop drinking clear liquids.  Medicines Ask your health care provider about:  Changing or stopping your regular medicines. This is especially important if you are taking diabetes medicines or blood thinners.  Taking medicines such as aspirin and ibuprofen. These medicines can thin your blood. Do not take these medicines before your procedure if your health care provider instructs you not to.  Tests and exams  You will have a physical exam.  You may have blood tests done to show: ? How well your kidneys and liver are working. ? How well your blood can clot.  General instructions  Plan to have someone  take you home from the hospital or clinic.  If you will be going home right after the procedure, plan to have someone with you for 24 hours.  What happens during the procedure?  Your blood pressure, heart rate, breathing, level of pain and overall condition will be monitored.  An IV tube will be inserted into one of your veins.  Your anesthesia specialist will give you medicines as needed to keep you comfortable during the procedure. This may mean changing the level of sedation.  The procedure will be performed. After the procedure  Your blood pressure, heart rate, breathing rate, and blood oxygen level will be monitored until the medicines you were given have worn off.  Do not drive for 24 hours if you received a sedative.  You may: ? Feel sleepy, clumsy, or nauseous. ? Feel forgetful about what happened after the procedure. ? Have a sore throat if you had a breathing tube during the procedure. ? Vomit. This information is not intended to replace advice given to you by your health care provider. Make sure you discuss any questions you have with your health care provider. Document Released: 12/23/2004 Document Revised: 09/05/2015 Document Reviewed: 07/20/2015 Elsevier Interactive Patient Education  2018 Elsevier Inc. Monitored Anesthesia Care, Care After These instructions provide you with information about caring for yourself after your procedure. Your health care provider may also give you more specific instructions. Your treatment has been planned according to current medical practices, but problems sometimes occur. Call your health care provider if you have any problems or questions after your procedure. What can I expect after the procedure? After your procedure, it is common to:  Feel sleepy for several hours.  Feel clumsy and have poor balance for several hours.  Feel forgetful about what happened after the procedure.  Have poor judgment for several hours.  Feel  nauseous or vomit.  Have a sore throat if you had a breathing tube during the procedure.  Follow these instructions at home:  For at least 24 hours after the procedure:   Do not: ? Participate in activities in which you could fall or become injured. ? Drive. ? Use heavy machinery. ? Drink alcohol. ? Take sleeping pills or medicines that cause drowsiness. ? Make important decisions or sign legal documents. ? Take care of children on your own.  Rest. Eating and drinking  Follow the diet that is recommended by your health care provider.  If you vomit, drink water, juice, or soup when you can drink without vomiting.  Make sure you have little or no nausea before eating solid foods. General instructions  Have a responsible adult stay with you until you are awake and alert.  Take over-the-counter and prescription medicines only as told by your health care provider.  If you smoke, do not smoke without supervision.  Keep all follow-up visits as told by your health care provider. This is important. Contact a health care provider if:  You keep feeling nauseous or you keep vomiting.  You feel light-headed.  You develop a rash.  You have a fever. Get help right away if:  You have trouble breathing. This information is not intended to replace advice given to you by your health care provider. Make sure you discuss any questions you have with your health care provider. Document Released: 07/20/2015 Document Revised: 11/19/2015 Document Reviewed: 07/20/2015 Elsevier Interactive Patient Education  [employer].

## 2016-09-09 NOTE — Telephone Encounter (Signed)
Pre-cert Verification for the following procedure    TEE CARDIOVERSION- PERSISTENT AFIB scheduled for 09/14/16 At [facility] Dr. Diona Browner

## 2016-09-09 NOTE — Patient Instructions (Addendum)
Medication Instructions:  Your physician has recommended you make the following change in your medication: STOP ASPIRIN BEGIN ELIQUIS 5 MG DAILY  Labwork: NONE  Testing/Procedures: Your physician has requested that you have a TEE/Cardioversion. During a TEE, sound waves are used to create images of your heart. It provides your doctor with information about the size and shape of your heart and how well your heart's chambers and valves are working. In this test, a transducer is attached to the end of a flexible tube that is guided down you throat and into your esophagus (the tube leading from your mouth to your stomach) to get a more detailed image of your heart. Once the TEE has determined that a blood clot is not present, the cardioversion begins. Electrical Cardioversion uses a jolt of electricity to your heart either through paddles or wired patches attached to your chest. This is a controlled, usually prescheduled, procedure. This procedure is done at the hospital and you are not awake during the procedure. You usually go home the day of the procedure. Please see the instruction sheet given to you today for more information.   Follow-Up: Your physician recommends that you schedule a follow-up appointment in: 3 WEEKS AFTER CARDIOVERSION   Any Other Special Instructions Will Be Listed Below (If Applicable).  PRE OP scheduled for tomorrow 09/10/16 AT 12:45 TEE/Cardioversion scheduled for Tuesday 6/5 at 11:30. Register at [employer] at [facility] at 10:00  If you need a refill on your cardiac medications before your next appointment, please call your pharmacy.

## 2016-09-10 ENCOUNTER — Encounter (HOSPITAL_COMMUNITY): Payer: Self-pay

## 2016-09-10 ENCOUNTER — Encounter (HOSPITAL_COMMUNITY)
Admission: RE | Admit: 2016-09-10 | Discharge: 2016-09-10 | Disposition: A | Discharge status: Home / Self Care | Payer: 59 | Source: Ambulatory Visit | Attending: Cardiology | Admitting: Cardiology

## 2016-09-10 DIAGNOSIS — I481 Persistent atrial fibrillation: Secondary | ICD-10-CM | POA: Diagnosis not present

## 2016-09-10 HISTORY — DX: Cardiac arrhythmia, unspecified: I49.9

## 2016-09-10 LAB — CBC WITH DIFFERENTIAL/PLATELET
BASOS ABS: 0.1 10*3/uL (ref 0.0–0.1)
BASOS PCT: 2 %
EOS ABS: 0.3 10*3/uL (ref 0.0–0.7)
Eosinophils Relative: 4 %
HCT: 44.6 % (ref 36.0–46.0)
HEMOGLOBIN: 14.9 g/dL (ref 12.0–15.0)
Lymphocytes Relative: 40 %
Lymphs Abs: 3.3 10*3/uL (ref 0.7–4.0)
MCH: 30.3 pg (ref 26.0–34.0)
MCHC: 33.4 g/dL (ref 30.0–36.0)
MCV: 90.8 fL (ref 78.0–100.0)
Monocytes Absolute: 0.5 10*3/uL (ref 0.1–1.0)
Monocytes Relative: 7 %
NEUTROS PCT: 47 %
Neutro Abs: 3.9 10*3/uL (ref 1.7–7.7)
Platelets: 246 10*3/uL (ref 150–400)
RBC: 4.91 MIL/uL (ref 3.87–5.11)
RDW: 13.6 % (ref 11.5–15.5)
WBC: 8.1 10*3/uL (ref 4.0–10.5)

## 2016-09-10 LAB — BASIC METABOLIC PANEL
ANION GAP: 8 (ref 5–15)
BUN: 16 mg/dL (ref 6–20)
CALCIUM: 9.3 mg/dL (ref 8.9–10.3)
CO2: 26 mmol/L (ref 22–32)
CREATININE: 0.82 mg/dL (ref 0.44–1.00)
Chloride: 105 mmol/L (ref 101–111)
GFR calc non Af Amer: >60 mL/min (ref >60)
Glucose, Bld: 110 mg/dL — ABNORMAL HIGH (ref 65–99)
Potassium: 4.2 mmol/L (ref 3.5–5.1)
SODIUM: 139 mmol/L (ref 135–145)

## 2016-09-13 MED ORDER — PROPOFOL 10 MG/ML IV BOLUS
INTRAVENOUS | Status: AC
  Filled 2016-09-13: qty 40

## 2016-09-14 ENCOUNTER — Encounter (HOSPITAL_COMMUNITY)
Admission: RE | Disposition: A | Discharge status: Home / Self Care | Payer: Self-pay | Source: Ambulatory Visit | Attending: Cardiology

## 2016-09-14 ENCOUNTER — Encounter (HOSPITAL_COMMUNITY): Payer: Self-pay

## 2016-09-14 ENCOUNTER — Ambulatory Visit (HOSPITAL_BASED_OUTPATIENT_CLINIC_OR_DEPARTMENT_OTHER): Payer: 59

## 2016-09-14 ENCOUNTER — Ambulatory Visit (HOSPITAL_COMMUNITY): Payer: 59 | Admitting: Anesthesiology

## 2016-09-14 ENCOUNTER — Ambulatory Visit (HOSPITAL_COMMUNITY)
Admission: RE | Admit: 2016-09-14 | Discharge: 2016-09-14 | Disposition: A | Discharge status: Home / Self Care | Payer: 59 | Source: Ambulatory Visit | Attending: Cardiology | Admitting: Cardiology

## 2016-09-14 ENCOUNTER — Inpatient Hospital Stay (HOSPITAL_COMMUNITY): Admission: RE | Admit: 2016-09-14 | Payer: 59 | Source: Ambulatory Visit

## 2016-09-14 DIAGNOSIS — I340 Nonrheumatic mitral (valve) insufficiency: Secondary | ICD-10-CM | POA: Diagnosis not present

## 2016-09-14 DIAGNOSIS — Z7982 Long term (current) use of aspirin: Secondary | ICD-10-CM | POA: Diagnosis not present

## 2016-09-14 DIAGNOSIS — N83201 Unspecified ovarian cyst, right side: Secondary | ICD-10-CM | POA: Diagnosis not present

## 2016-09-14 DIAGNOSIS — I4892 Unspecified atrial flutter: Secondary | ICD-10-CM | POA: Insufficient documentation

## 2016-09-14 DIAGNOSIS — I10 Essential (primary) hypertension: Secondary | ICD-10-CM | POA: Diagnosis not present

## 2016-09-14 DIAGNOSIS — H3530 Unspecified macular degeneration: Secondary | ICD-10-CM | POA: Diagnosis not present

## 2016-09-14 DIAGNOSIS — I480 Paroxysmal atrial fibrillation: Secondary | ICD-10-CM | POA: Diagnosis present

## 2016-09-14 DIAGNOSIS — I481 Persistent atrial fibrillation: Secondary | ICD-10-CM

## 2016-09-14 DIAGNOSIS — I4819 Other persistent atrial fibrillation: Secondary | ICD-10-CM

## 2016-09-14 HISTORY — PX: CARDIOVERSION: SHX1299

## 2016-09-14 HISTORY — PX: TEE WITHOUT CARDIOVERSION: SHX5443

## 2016-09-14 SURGERY — CARDIOVERSION
Anesthesia: Monitor Anesthesia Care

## 2016-09-14 MED ORDER — MIDAZOLAM HCL 2 MG/2ML IJ SOLN
1.0000 mg | INTRAMUSCULAR | Status: AC
  Administered 2016-09-14: 2 mg via INTRAVENOUS

## 2016-09-14 MED ORDER — FENTANYL CITRATE (PF) 100 MCG/2ML IJ SOLN
INTRAMUSCULAR | Status: AC
  Filled 2016-09-14: qty 2

## 2016-09-14 MED ORDER — SODIUM CHLORIDE 0.9 % IV SOLN: INTRAVENOUS | Status: DC

## 2016-09-14 MED ORDER — PROPOFOL 10 MG/ML IV BOLUS
INTRAVENOUS | Status: AC
  Filled 2016-09-14: qty 20

## 2016-09-14 MED ORDER — LACTATED RINGERS IV SOLN
INTRAVENOUS | Status: DC
  Administered 2016-09-14: 11:00:00 via INTRAVENOUS

## 2016-09-14 MED ORDER — FENTANYL CITRATE (PF) 100 MCG/2ML IJ SOLN
25.0000 ug | Freq: Once | INTRAMUSCULAR | Status: AC
  Administered 2016-09-14: 25 ug via INTRAVENOUS

## 2016-09-14 MED ORDER — LIDOCAINE VISCOUS 2 % MT SOLN
OROMUCOSAL | Status: AC
  Filled 2016-09-14: qty 15

## 2016-09-14 MED ORDER — SODIUM CHLORIDE BACTERIOSTATIC 0.9 % IJ SOLN
INTRAMUSCULAR | Status: AC
  Filled 2016-09-14: qty 10

## 2016-09-14 MED ORDER — MIDAZOLAM HCL 2 MG/2ML IJ SOLN
INTRAMUSCULAR | Status: AC
  Filled 2016-09-14: qty 2

## 2016-09-14 MED ORDER — PROPOFOL 10 MG/ML IV BOLUS
INTRAVENOUS | Status: DC | PRN
  Administered 2016-09-14: 10 mg via INTRAVENOUS

## 2016-09-14 MED ORDER — MIDAZOLAM HCL 5 MG/5ML IJ SOLN
INTRAMUSCULAR | Status: DC | PRN
  Administered 2016-09-14: 1 mg via INTRAVENOUS

## 2016-09-14 MED ORDER — PROPOFOL 500 MG/50ML IV EMUL
INTRAVENOUS | Status: DC | PRN
  Administered 2016-09-14: 125 ug/kg/min via INTRAVENOUS

## 2016-09-14 NOTE — Anesthesia Preprocedure Evaluation (Signed)
Anesthesia Evaluation  Patient identified by MRN, date of birth, ID band Patient awake    Reviewed: Allergy & Precautions, NPO status , Patient's Chart, lab work & pertinent test results  Airway Mallampati: I  TM Distance: >3 FB     Dental  (+) Teeth Intact   Pulmonary neg pulmonary ROS,    breath sounds clear to auscultation       Cardiovascular hypertension, Pt. on medications + DOE  + dysrhythmias  Rhythm:Regular Rate:Normal     Neuro/Psych negative neurological ROS  negative psych ROS   GI/Hepatic negative GI ROS, Neg liver ROS,   Endo/Other    Renal/GU negative Renal ROS     Musculoskeletal   Abdominal   Peds  Hematology negative hematology ROS (+)   Anesthesia Other Findings   Reproductive/Obstetrics                             Anesthesia Physical Anesthesia Plan  ASA: III  Anesthesia Plan: MAC   Post-op Pain Management:    Induction: Intravenous  PONV Risk Score and Plan:   Airway Management Planned: Simple Face Mask  Additional Equipment:   Intra-op Plan:   Post-operative Plan:   Informed Consent: I have reviewed the patients History and Physical, chart, labs and discussed the procedure including the risks, benefits and alternatives for the proposed anesthesia with the patient or authorized representative who has indicated his/her understanding and acceptance.     Plan Discussed with:   Anesthesia Plan Comments:         Anesthesia Quick Evaluation

## 2016-09-14 NOTE — Discharge Instructions (Signed)
Electrical Cardioversion, Care After °This sheet gives you information about how to care for yourself after your procedure. Your health care provider may also give you more specific instructions. If you have problems or questions, contact your health care provider. °What can I expect after the procedure? °After the procedure, it is common to have: °· Some redness on the skin where the shocks were given. ° °Follow these instructions at home: °· Do not drive for 24 hours if you were given a medicine to help you relax (sedative). °· Take over-the-counter and prescription medicines only as told by your health care provider. °· Ask your health care provider how to check your pulse. Check it often. °· Rest for 48 hours after the procedure or as told by your health care provider. °· Avoid or limit your caffeine use as told by your health care provider. °Contact a health care provider if: °· You feel like your heart is beating too quickly or your pulse is not regular. °· You have a serious muscle cramp that does not go away. °Get help right away if: °· You have discomfort in your chest. °· You are dizzy or you feel faint. °· You have trouble breathing or you are short of breath. °· Your speech is slurred. °· You have trouble moving an arm or leg on one side of your body. °· Your fingers or toes turn cold or blue. °This information is not intended to replace advice given to you by your health care provider. Make sure you discuss any questions you have with your health care provider. °Document Released: 01/17/2013 Document Revised: 10/31/2015 Document Reviewed: 10/03/2015 °Elsevier Interactive Patient Education © 2018 Elsevier Inc. ° °

## 2016-09-14 NOTE — Progress Notes (Signed)
*  PRELIMINARY RESULTS* Echocardiogram Echocardiogram Transesophageal has been performed.  Stacey Drain 09/14/2016, 1:56 PM

## 2016-09-14 NOTE — Anesthesia Postprocedure Evaluation (Signed)
Anesthesia Post Note  Patient: Renee Benitez  Procedure(s) Performed: Procedure(s) (LRB): CARDIOVERSION (N/A) TRANSESOPHAGEAL ECHOCARDIOGRAM (TEE) WITH PROPOFOL (N/A)  Patient location during evaluation: PACU Anesthesia Type: MAC Level of consciousness: awake and alert and oriented Pain management: pain level controlled Vital Signs Assessment: post-procedure vital signs reviewed and stable Respiratory status: spontaneous breathing Cardiovascular status: blood pressure returned to baseline Postop Assessment: no signs of nausea or vomiting Anesthetic complications: no     Last Vitals:  Vitals:   09/14/16 1021 09/14/16 1215  BP: (!) 145/91 108/70  Pulse: (!) 132 60  Resp: 16 13  Temp: 36.8 C (!) 36.1 C    Last Pain:  Vitals:   09/14/16 1021  TempSrc: Oral                 Aldina Porta

## 2016-09-14 NOTE — CV Procedure (Signed)
Transesophageal echocardiogram guided elective cardioversion  Indication: Persistent atrial fibrillation/flutter  Description of procedure: After informed consent was obtained, patient was taken to the PACU. Timeout was performed. Anterior and posterior pads were placed and connected to a biphasic defibrillator in anticipation of cardioversion. She was placed in the left lateral decubitus position for TEE. Sedation was provided by the anesthesia service with propofol utilized. Viscous lidocaine utilized and bite block in place. Transesophageal echocardiogram probe was inserted into the esophagus and multiple images were obtained. Please refer to the full report. In summary, LVEF was normal range, there were no thrombi noted within the left or right atrial appendages. Mild mitral and tricuspid regurgitation were noted. Mild aortic regurgitation was noted. A small PFO was visualized by color Doppler imaging, however there was no significant right to left shunt by agitated saline injection. There was mild scattered atherosclerotic plaque within the aorta. Patient tolerated transesophageal echocardiogram without obvious complications and the probe was withdrawn. Next, patient placed supine and positioned for cardioversion. Sandbag was placed on anterior pad. Using a biphasic defibrillator, a single synchronized shock at 120 J was delivered with return to sinus rhythm, initially bradycardia, and then heart rate stabilizing in the 60s. Postprocedure ECG confirmed sinus rhythm. Patient tolerated cardioversion well without immediate complications.  [name], M.D., F.A.C.C.

## 2016-09-14 NOTE — H&P (View-Only) (Signed)
  Cardiology Office Note  Date: 09/09/2016   ID: Renee Benitez, DOB 05/19/1950, MRN [id number]  PCP: Knowlton, Steve, MD  Primary Cardiologist: Samuel McDowell, MD   Chief Complaint  Patient presents with  . PAF    History of Present Illness: Renee Benitez is a 65 y.o. female last seen in early May. She presents today for a follow-up visit. Unfortunately, she remains out of rhythm, looks to be in persistent atrial flutter today, heart rate around 119 by ECG. She has been compliant with flecainide and also Cardizem CD. Today I reiterated my concerns about initiating anticoagulation for stroke prophylaxis and at this point we would also move toward a cardioversion. She states that she has considered the matter and is willing to start anticoagulation. CHADSVASC score is 2.   Recent follow-up lab work is outlined below. Creatinine and hemoglobin are normal.  We discussed scheduling a TEE guided cardioversion for next week after she has initiated Eliquis this week. She will stop aspirin when she starts Eliquis.  Past Medical History:  Diagnosis Date  . Atrial fibrillation (HCC)   . Current use of estrogen therapy 01/31/2013  . Hypertension   . Macular degeneration   . Menopausal syndrome   . Right ovarian cyst 01/25/2014    Past Surgical History:  Procedure Laterality Date  . ABDOMINAL HYSTERECTOMY    . APPENDECTOMY    . CATARACT EXTRACTION W/ INTRAOCULAR LENS  IMPLANT, BILATERAL    . COLONOSCOPY  12/04/03   Friable anal canal otherwise normal rectum and colon  . COLONOSCOPY N/A 03/19/2013   Procedure: COLONOSCOPY;  Surgeon: Robert M Rourk, MD;  Location: [facility];  Service: Endoscopy;  Laterality: N/A;  1:15  . TONSILECTOMY, ADENOIDECTOMY, BILATERAL MYRINGOTOMY AND TUBES      Current Outpatient Prescriptions  Medication Sig Dispense Refill  . aspirin 81 MG tablet Take 81 mg by mouth 2 (two) times daily.     . calcium carbonate (OS-CAL) 600 MG TABS Take 600 mg by  mouth daily.      . diltiazem (CARDIZEM CD) 180 MG 24 hr capsule Take 1 capsule (180 mg total) by mouth daily. 120 capsule 3  . diltiazem (CARDIZEM) 30 MG tablet Take 1 tablet (30 mg total) by mouth every 6 (six) hours as needed. Take 1 to 2 tablets every 6 hrs. as needed for palpitations 30 tablet 6  . ergocalciferol (VITAMIN D2) 50000 UNITS capsule Take 50,000 Units by mouth once a week.      . estradiol (VIVELLE-DOT) 0.1 MG/24HR patch PLACE ONE PATCH ONTO THE SKIN TWO TIMES A WEEK 24 patch 4  . flecainide (TAMBOCOR) 100 MG tablet Take 1 tablet (100 mg total) by mouth 2 (two) times daily. 180 tablet 3  . Magnesium 250 MG TABS Take by mouth daily.    . Multiple Vitamins-Minerals (MULTIVITAMIN WITH MINERALS) tablet Take 1 tablet by mouth daily.      . Multiple Vitamins-Minerals (PRESERVISION AREDS 2) CAPS Take 1 capsule by mouth 2 (two) times daily.    . Omega-3 Fatty Acids (FISH OIL PO) Take by mouth daily.     No current facility-administered medications for this visit.    Allergies:  Patient has no known allergies.   Social History: The patient  reports that she has never smoked. She has never used smokeless tobacco. She reports that she drinks alcohol. She reports that she does not use drugs.   Family History: The patient's family history includes Arrhythmia in her sister;   Cancer in her father; Colon cancer in her maternal grandmother; Coronary artery disease in her father; Hypertension in her sister and son.   ROS:  Please see the history of present illness. Otherwise, complete review of systems is positive for dyspnea on exertion.  All other systems are reviewed and negative.   Physical Exam: VS:  BP 124/82   Pulse (!) 115   Ht 5' 6" (1.676 m)   Wt 182 lb 12.8 oz (82.9 kg)   SpO2 97%   BMI 29.50 kg/m , BMI Body mass index is 29.5 kg/m.  Wt Readings from Last 3 Encounters:  09/09/16 182 lb 12.8 oz (82.9 kg)  08/10/16 178 lb (80.7 kg)  06/23/16 175 lb 12.8 oz (79.7 kg)      Appears comfortable at rest.  HEENT: Conjunctiva and lids normal, oropharynx clear.  Neck: Supple, no elevated JVP or bruits.  Lungs: Clear to auscultation, nontender.  Cardiac: Irregularly irregular, no S3.  Abdomen: Soft, nontender, bowel sounds present.  Skin: Warm and dry. Musculoskeletal: No kyphosis. Neuropsychiatric: Alert and oriented 3, affect appropriate.  ECG: I personally reviewed the tracing from 08/10/2016 which showed probable atypical atrial flutter with 2:1 block and RVR.  Recent Labwork:  May 2018: BUN 14, creatinine 0.77, potassium 4.4, hemoglobin 15.3, platelets 250  Other Studies Reviewed Today:  Exercise echocardiogram 03/28/2013: Study Conclusions  - Stress ECG conclusions: The ECG during stress did not reveal any diagnostic ST-T abnormalities, nor were there any arrhythmias during stress. During recovery, multiple ventricular couplets and triplets were noted. Duke scoring: exercise time of 7.5min; maximum ST deviation of 0mm; no angina; resulting score is 8. This score predicts a low risk of cardiac events. - Staged echo: Resting left ventricular systolic function was normal, EF 55-60%. With exercise, there was normal augmentation of all wall segments, with hyperdynamic contraction seen. No evidence of inducible ischemia.  Assessment and Plan:  1. Persistent atrial fibrillation/flutter. Patient continues on flecainide and Cardizem CD. With CHASDSVASC score of 2, we are moving ahead with anticoagulation, she has agreed to start Eliquis 5 mg twice daily, aspirin will be stopped. We will schedule a TEE guided cardioversion for next week, I am hopeful that she will be able to maintain sinus rhythm on flecainide thereafter. If not, can consider other options such as EP referral for ablation.  2. Essential hypertension, systolic pressure 120 today. She is on Cardizem CD daily.  Current medicines were reviewed with the patient  today.   Orders Placed This Encounter  Procedures  . EKG 12-Lead    Disposition: Follow-up after cardioversion.  Signed, Samuel G. McDowell, MD, FACC 09/09/2016 9:45 AM     [facility] [place] [address], [place], [place] [address] Phone: [phone]; Fax: [phone] 

## 2016-09-14 NOTE — Interval H&P Note (Signed)
History and Physical Interval Note:  09/14/2016 11:20 AM  Patient presents for TEE guided cardioversion for persistent atrial fibrillation/flutter. She is on Eliquis, flecainide, and Cardizem CD. I reviewed her recent labwork. Informed consent obtained and she is ready to proceed.   Renee Benitez

## 2016-09-14 NOTE — Progress Notes (Signed)
Electrical Cardioversion Procedure Note Renee Benitez [id number] 06-01-1950  Procedure: Electrical Cardioversion Indications:  Atrial Fibrillation and Atrial Flutter  Procedure Details Consent: Risks of procedure as well as the alternatives and risks of each were explained to the (patient/caregiver).  Consent for procedure obtained. Time Out: Verified patient identification, verified procedure, site/side was marked, verified correct patient position, special equipment/implants available, medications/allergies/relevent history reviewed, required imaging and test results available.  Performed  Patient placed on cardiac monitor, pulse oximetry, supplemental oxygen as necessary.  Sedation given: propofol Pacer pads placed anterior and posterior chest.  Cardioverted 1 time(s).  Cardioverted at 120J.  Evaluation Findings: Post procedure EKG shows: NSR Complications: None Patient did tolerate procedure well.   Trenton Gammon S 09/14/2016, 2:33 PM

## 2016-09-14 NOTE — Transfer of Care (Signed)
Immediate Anesthesia Transfer of Care Note  Patient: Renee Benitez  Procedure(s) Performed: Procedure(s): CARDIOVERSION (N/A) TRANSESOPHAGEAL ECHOCARDIOGRAM (TEE) WITH PROPOFOL (N/A)  Patient Location: PACU  Anesthesia Type:MAC  Level of Consciousness: awake and alert   Airway & Oxygen Therapy: Patient Spontanous Breathing and Patient connected to nasal cannula oxygen  Post-op Assessment: Report given to RN  Post vital signs: Reviewed and stable  Last Vitals:  Vitals:   09/14/16 1021  BP: (!) 145/91  Pulse: (!) 132  Resp: 16  Temp: 36.8 C    Last Pain:  Vitals:   09/14/16 1021  TempSrc: Oral         Complications: No apparent anesthesia complications

## 2016-09-15 ENCOUNTER — Encounter (HOSPITAL_COMMUNITY): Payer: Self-pay | Admitting: Cardiology

## 2016-09-17 ENCOUNTER — Telehealth: Payer: Self-pay | Admitting: Cardiology

## 2016-09-17 ENCOUNTER — Ambulatory Visit (INDEPENDENT_AMBULATORY_CARE_PROVIDER_SITE_OTHER): Payer: 59 | Admitting: *Deleted

## 2016-09-17 ENCOUNTER — Telehealth: Payer: Self-pay | Admitting: *Deleted

## 2016-09-17 VITALS — BP 126/74

## 2016-09-17 DIAGNOSIS — I480 Paroxysmal atrial fibrillation: Secondary | ICD-10-CM

## 2016-09-17 NOTE — Telephone Encounter (Signed)
error 

## 2016-09-17 NOTE — Telephone Encounter (Signed)
Pt had cardioversion on Tuesday, said last night she started to have irregular beats last night so she took an extra  diltiazem (CARDIZEM) 30 MG tablet [[id number]] & laid down, but hasn't helped. Please give her a call @ [phone]

## 2016-09-17 NOTE — Addendum Note (Signed)
Addended by: Elesa Massed on: 09/17/2016 12:49 PM   Modules accepted: Orders

## 2016-09-17 NOTE — Telephone Encounter (Signed)
Patient states that on Thursday evening she started to feel as if her heart was out of rhythm. Pt denies chest pain, and SOB at this time. She does Dizziness on exertion. States that " It feels like the last time my heart was out of rhythm". Pt reports taking all morning medications today at 0730. She then took PRN cardizem 30 mg at 8 am and again at 9 am. States that this has not helped and would like to know if she may take more Flecainide? Pt informed not to increase medication unless informed to do so from provider. Pt reports current BP as 152/96 and HR of 108. Please advise.

## 2016-09-17 NOTE — Telephone Encounter (Signed)
Would have her come in for nurse visit to get ECG and confirm rhythm. I suspect she is out of rhythm as well based on her current heart rate. We did discuss this possibility and the fact that we might need to refer her for EP consultation to go over other options. Would not increase flecainide further at this time, she is already on 100 mg twice daily. Please schedule EP consultation ASAP so that she can review other options for management.

## 2016-09-17 NOTE — Progress Notes (Signed)
Patient states that she has had a headache over the last few days. But is able to tolerate it right now.

## 2016-09-28 ENCOUNTER — Encounter: Payer: Self-pay | Admitting: Internal Medicine

## 2016-09-28 ENCOUNTER — Ambulatory Visit (INDEPENDENT_AMBULATORY_CARE_PROVIDER_SITE_OTHER): Payer: 59 | Admitting: Internal Medicine

## 2016-09-28 VITALS — BP 122/82 | HR 67 | Ht 66.0 in | Wt 179.0 lb

## 2016-09-28 DIAGNOSIS — I4891 Unspecified atrial fibrillation: Secondary | ICD-10-CM

## 2016-09-28 MED ORDER — APIXABAN 5 MG PO TABS: 5.0000 mg | ORAL_TABLET | Freq: Two times a day (BID) | ORAL | 3 refills | Status: DC

## 2016-09-28 NOTE — Patient Instructions (Signed)
Your physician recommends that you schedule a follow-up appointment in: 6 weeks with Dr Ladona Ridgel    Your physician has requested that you have an exercise tolerance test. For further information please visit [email or web address] Please also follow instruction sheet, as given.     Your physician recommends that you continue on your current medications as directed. Please refer to the Current Medication list given to you today.     No labs ordered today     Thank you for choosing [facility] !

## 2016-09-28 NOTE — Progress Notes (Signed)
HPI [name] is referred today by Dr. Diona Browner for evaluation of atrial fib and flutter. Sheis a pleasant 66 yo woman who developed atrial fib initially 5-6 years ago. Her symptoms were initially well controlled. She has had a gradual worsening since fall of 2017 when she ultimately had to go on flecainde and undergo DCCV. She has maintained NSR on flecainide the past few days. She is also on a calcium channel blocker for rate control. She has been anti-coagulated with Eliquis. She has had some atypical atrial fltuter but it looks like most of her arrhythmias are atrial fib. She has sob and chest pressure with her arrhythmias. Also notes a trace of peripheral edema. No Known Allergies   Current Outpatient Prescriptions  Medication Sig Dispense Refill  . apixaban (ELIQUIS) 5 MG TABS tablet Take 1 tablet (5 mg total) by mouth 2 (two) times daily. 60 tablet 3  . calcium carbonate (OS-CAL) 600 MG TABS Take 600 mg by mouth daily.      Marland Kitchen diltiazem (CARDIZEM CD) 180 MG 24 hr capsule Take 1 capsule (180 mg total) by mouth daily. 120 capsule 3  . diltiazem (CARDIZEM) 30 MG tablet Take 1 tablet (30 mg total) by mouth every 6 (six) hours as needed. Take 1 to 2 tablets every 6 hrs. as needed for palpitations 30 tablet 6  . ergocalciferol (VITAMIN D2) 50000 UNITS capsule Take 50,000 Units by mouth once a week.      . estradiol (VIVELLE-DOT) 0.1 MG/24HR patch PLACE ONE PATCH ONTO THE SKIN TWO TIMES A WEEK 24 patch 4  . flecainide (TAMBOCOR) 100 MG tablet Take 1 tablet (100 mg total) by mouth 2 (two) times daily. 180 tablet 3  . Magnesium 250 MG TABS Take 1 tablet by mouth at bedtime.     . Multiple Vitamins-Minerals (MULTIVITAMIN WITH MINERALS) tablet Take 1 tablet by mouth daily.      . Multiple Vitamins-Minerals (PRESERVISION AREDS 2) CAPS Take 1 capsule by mouth 2 (two) times daily.    . Omega-3 Fatty Acids (FISH OIL PO) Take 1 capsule by mouth daily.      No current facility-administered  medications for this visit.      Past Medical History:  Diagnosis Date  . Atrial fibrillation (HCC)   . Current use of estrogen therapy 01/31/2013  . Dysrhythmia    AFib  . Hypertension   . Macular degeneration   . Menopausal syndrome   . Right ovarian cyst 01/25/2014    ROS:   All systems reviewed and negative except as noted in the HPI.   Past Surgical History:  Procedure Laterality Date  . ABDOMINAL HYSTERECTOMY    . APPENDECTOMY    . CARDIOVERSION N/A 09/14/2016   Procedure: CARDIOVERSION;  Surgeon: [name], MD;  Location: [facility];  Service: Cardiovascular;  Laterality: N/A;  . CATARACT EXTRACTION W/ INTRAOCULAR LENS  IMPLANT, BILATERAL Bilateral   . COLONOSCOPY  12/04/03   Friable anal canal otherwise normal rectum and colon  . COLONOSCOPY N/A 03/19/2013   Procedure: COLONOSCOPY;  Surgeon: [name], MD;  Location: [facility];  Service: Endoscopy;  Laterality: N/A;  1:15  . TEE WITHOUT CARDIOVERSION N/A 09/14/2016   Procedure: TRANSESOPHAGEAL ECHOCARDIOGRAM (TEE) WITH PROPOFOL;  Surgeon: [name], MD;  Location: [facility];  Service: Cardiovascular;  Laterality: N/A;  . TONSILECTOMY, ADENOIDECTOMY, BILATERAL MYRINGOTOMY AND TUBES       Family History  Problem Relation Age of Onset  .  Coronary artery disease Father   . Cancer Father        bladder cancer  . Arrhythmia Sister        Reportedly had ablation of SVT  . Hypertension Sister   . Colon cancer Maternal Grandmother   . Hypertension Son      Social History   Social History  . Marital status: Married    Spouse name: N/A  . Number of children: N/A  . Years of education: N/A   Occupational History  . Nurse     [employer]   Social History Main Topics  . Smoking status: Never Smoker  . Smokeless tobacco: Never Used     Comment: Never smoked  . Alcohol use 0.0 oz/week     Comment: Rarely glass of wine  . Drug use: No  . Sexual activity: Yes    Birth control/  protection: Surgical     Comment: hyst   Other Topics Concern  . Not on file   Social History Narrative  . No narrative on file     BP 122/82   Pulse 67   Ht 5\' 6"  (1.676 m)   Wt 179 lb (81.2 kg)   SpO2 97%   BMI 28.89 kg/m   Physical Exam:  Well appearing 66 yo woman, NAD HEENT: Unremarkable Neck:  6 cm JVD, no thyromegally Lymphatics:  No adenopathy Back:  No CVA tenderness Lungs:  Clear with no wheezes HEART:  Regular rate rhythm, no murmurs, no rubs, no clicks Abd:  soft, positive bowel sounds, no organomegally, no rebound, no guarding Ext:  2 plus pulses, no edema, no cyanosis, no clubbing Skin:  No rashes no nodules Neuro:  CN II through XII intact, motor grossly intact  EKG -nsr with a QRS of 106   Assess/Plan: 1. PAF - she is currently maintaining NSR on flecainide. I have asked her to continue 100 bid. She will need to undergo regular exercise testing to rule out proarrhythmias. I might consider adding acebutolol and stopping cardizem in the future.  2. Atrial flutter - she appears to be in this only a minimal amount of time. Will follow. 3. HTN - she carries this diagnosis although her BP is good today. She will continue her cardizem.

## 2016-09-29 ENCOUNTER — Ambulatory Visit (HOSPITAL_COMMUNITY)
Admission: RE | Admit: 2016-09-29 | Discharge: 2016-09-29 | Disposition: A | Discharge status: Home / Self Care | Payer: 59 | Source: Ambulatory Visit | Attending: Internal Medicine | Admitting: Internal Medicine

## 2016-09-29 DIAGNOSIS — I4891 Unspecified atrial fibrillation: Secondary | ICD-10-CM | POA: Insufficient documentation

## 2016-09-29 LAB — EXERCISE TOLERANCE TEST
CSEPED: 8 min
CSEPEDS: 0 s
CSEPHR: 87 %
Estimated workload: 10.1 METS
MPHR: 157 {beats}/min
Peak HR: 137 {beats}/min
RPE: 13
Rest HR: 57 {beats}/min

## 2016-10-06 ENCOUNTER — Ambulatory Visit: Payer: 59 | Admitting: Adult Health

## 2016-11-01 ENCOUNTER — Ambulatory Visit: Payer: 59 | Admitting: Internal Medicine

## 2016-11-01 ENCOUNTER — Telehealth: Payer: Self-pay | Admitting: Internal Medicine

## 2016-11-01 NOTE — Telephone Encounter (Signed)
Will forward to Dr. Taylor as an FYI.  

## 2016-11-01 NOTE — Telephone Encounter (Signed)
Pt called stating that she has been in afib for the past 4 days. She has taken extra diltiazem regularly and it is not helping. She has a vacation coming up in a few weeks and is wanting to get in asap to speak with Dr. Ladona Ridgel. Scheduled her for Thursday @ 2:45.

## 2016-11-01 NOTE — Telephone Encounter (Signed)
Per Voicemail--pt has been in Afib for 4 days

## 2016-11-04 ENCOUNTER — Ambulatory Visit: Payer: 59 | Admitting: Internal Medicine

## 2016-12-09 ENCOUNTER — Encounter: Payer: Self-pay | Admitting: Internal Medicine

## 2016-12-09 ENCOUNTER — Ambulatory Visit (INDEPENDENT_AMBULATORY_CARE_PROVIDER_SITE_OTHER): Payer: 59 | Admitting: Internal Medicine

## 2016-12-09 VITALS — BP 118/68 | HR 91 | Ht 66.5 in | Wt 182.0 lb

## 2016-12-09 DIAGNOSIS — I10 Essential (primary) hypertension: Secondary | ICD-10-CM | POA: Diagnosis not present

## 2016-12-09 MED ORDER — APIXABAN 5 MG PO TABS: 5.0000 mg | ORAL_TABLET | Freq: Two times a day (BID) | ORAL | 11 refills | Status: DC

## 2016-12-09 NOTE — Progress Notes (Signed)
HPI Renee Benitez returns today for follow-up. She is a pleasant 66 year old woman with paroxysmal atrial fibrillation who was placed on flecainide therapy. When I saw her last, she had an uptake in her symptoms and we increased her flecainide to 100 mg twice a day. In addition she takes calcium channel blockers. Since I saw her last, she has had 3 days of atrial fibrillation but otherwise feels well. She underwent exercise treadmill testing demonstrating no proarrhythmia QRS prolongation. She admits to dietary indiscretion. She has gained over 60 pounds in the last 40 years. No Known Allergies   Current Outpatient Prescriptions  Medication Sig Dispense Refill  . apixaban (ELIQUIS) 5 MG TABS tablet Take 1 tablet (5 mg total) by mouth 2 (two) times daily. 60 tablet 3  . calcium carbonate (OS-CAL) 600 MG TABS Take 600 mg by mouth daily.      Marland Kitchen diltiazem (CARDIZEM CD) 180 MG 24 hr capsule Take 1 capsule (180 mg total) by mouth daily. 120 capsule 3  . diltiazem (CARDIZEM) 30 MG tablet Take 1 tablet (30 mg total) by mouth every 6 (six) hours as needed. Take 1 to 2 tablets every 6 hrs. as needed for palpitations 30 tablet 6  . ergocalciferol (VITAMIN D2) 50000 UNITS capsule Take 50,000 Units by mouth once a week.      . estradiol (VIVELLE-DOT) 0.1 MG/24HR patch PLACE ONE PATCH ONTO THE SKIN TWO TIMES A WEEK 24 patch 4  . flecainide (TAMBOCOR) 100 MG tablet Take 1 tablet (100 mg total) by mouth 2 (two) times daily. 180 tablet 3  . Magnesium 250 MG TABS Take 1 tablet by mouth at bedtime.     . Multiple Vitamins-Minerals (MULTIVITAMIN WITH MINERALS) tablet Take 1 tablet by mouth daily.      . Multiple Vitamins-Minerals (PRESERVISION AREDS 2) CAPS Take 1 capsule by mouth 2 (two) times daily.    . Omega-3 Fatty Acids (FISH OIL PO) Take 1 capsule by mouth daily.      No current facility-administered medications for this visit.      Past Medical History:  Diagnosis Date  . Atrial fibrillation  (HCC)   . Current use of estrogen therapy 01/31/2013  . Dysrhythmia    AFib  . Hypertension   . Macular degeneration   . Menopausal syndrome   . Right ovarian cyst 01/25/2014    ROS:   All systems reviewed and negative except as noted in the HPI.   Past Surgical History:  Procedure Laterality Date  . ABDOMINAL HYSTERECTOMY    . APPENDECTOMY    . CARDIOVERSION N/A 09/14/2016   Procedure: CARDIOVERSION;  Surgeon: [name], MD;  Location: [facility];  Service: Cardiovascular;  Laterality: N/A;  . CATARACT EXTRACTION W/ INTRAOCULAR LENS  IMPLANT, BILATERAL Bilateral   . COLONOSCOPY  12/04/03   Friable anal canal otherwise normal rectum and colon  . COLONOSCOPY N/A 03/19/2013   Procedure: COLONOSCOPY;  Surgeon: [name], MD;  Location: [facility];  Service: Endoscopy;  Laterality: N/A;  1:15  . TEE WITHOUT CARDIOVERSION N/A 09/14/2016   Procedure: TRANSESOPHAGEAL ECHOCARDIOGRAM (TEE) WITH PROPOFOL;  Surgeon: [name], MD;  Location: [facility];  Service: Cardiovascular;  Laterality: N/A;  . TONSILECTOMY, ADENOIDECTOMY, BILATERAL MYRINGOTOMY AND TUBES       Family History  Problem Relation Age of Onset  . Coronary artery disease Father   . Cancer Father        bladder cancer  . Arrhythmia Sister  Reportedly had ablation of SVT  . Hypertension Sister   . Colon cancer Maternal Grandmother   . Hypertension Son      Social History   Social History  . Marital status: Married    Spouse name: N/A  . Number of children: N/A  . Years of education: N/A   Occupational History  . Nurse     [employer]   Social History Main Topics  . Smoking status: Never Smoker  . Smokeless tobacco: Never Used     Comment: Never smoked  . Alcohol use 0.0 oz/week     Comment: Rarely glass of wine  . Drug use: No  . Sexual activity: Yes    Birth control/ protection: Surgical     Comment: hyst   Other Topics Concern  . Not on file   Social History Narrative    . No narrative on file     BP 118/68 (BP Location: Left Arm)   Pulse 91   Ht 5' 6.5" (1.689 m)   Wt 182 lb (82.6 kg)   SpO2 97%   BMI 28.94 kg/m   Physical Exam:  Well appearing 66 year old man,NAD HEENT: Unremarkable Neck:  6 cm JVD, no thyromegally Lymphatics:  No adenopathy Back:  No CVA tenderness Lungs:  Clear, with no wheezes, rales, or rhonchi HEART:  Regular rate rhythm, no murmurs, no rubs, no clicks Abd:  soft, positive bowel sounds, no organomegally, no rebound, no guarding Ext:  2 plus pulses, no edema, no cyanosis, no clubbing Skin:  No rashes no nodules Neuro:  CN II through XII intact, motor grossly intact  Assess/Plan: 1. Paroxysmal atrial fibrillation - she appears to be tolerating flecainide very nicely. Her symptoms are reasonably well controlled. It is been 4 years since we obtained a 2-D echo. I've recommended repeating this to evaluate her left atrial dimension. I suspect she will ultimately require atrial fibrillation ablation. The urgency to which we would refer her for this will depend on her left atrial dimension. If her left atrium is enlarging, I would be more likely to refer the patient sooner rather than later. 2. Hypertension - her blood pressure today is well controlled. She will continue her calcium channel blocker and is encouraged to maintain a low-sodium diet. In addition, I've asked the patient to lose weight. 3. Chronic systemic anticoagulation - she appears to be tolerating oral anticoagulation very nicely with no bleeding episodes.  Lewayne Bunting, M.D.

## 2016-12-09 NOTE — Patient Instructions (Signed)

## 2017-01-10 ENCOUNTER — Ambulatory Visit (HOSPITAL_COMMUNITY)
Admission: RE | Admit: 2017-01-10 | Discharge: 2017-01-10 | Disposition: A | Discharge status: Home / Self Care | Payer: 59 | Source: Ambulatory Visit | Attending: Internal Medicine | Admitting: Internal Medicine

## 2017-01-10 DIAGNOSIS — I10 Essential (primary) hypertension: Secondary | ICD-10-CM | POA: Insufficient documentation

## 2017-01-10 DIAGNOSIS — Z79899 Other long term (current) drug therapy: Secondary | ICD-10-CM | POA: Insufficient documentation

## 2017-01-10 DIAGNOSIS — I480 Paroxysmal atrial fibrillation: Secondary | ICD-10-CM | POA: Diagnosis not present

## 2017-01-10 LAB — ECHOCARDIOGRAM COMPLETE
AVLVOTPG: 5 mmHg
AVPHT: 438 ms
CHL CUP DOP CALC LVOT VTI: 26.5 cm
CHL CUP MV DEC (S): 246
CHL CUP RV SYS PRESS: 29 mmHg
CHL CUP STROKE VOLUME: 50 mL
E/e' ratio: 9.29
EWDT: 246 ms
FS: 41 % (ref 28–44)
IVS/LV PW RATIO, ED: 0.99
LA diam end sys: 37 mm
LA diam index: 1.86 cm/m2
LA vol A4C: 51.4 ml
LA vol index: 37.5 mL/m2
LA vol: 74.6 mL
LASIZE: 37 mm
LDCA: 3.46 cm2
LV E/e' medial: 9.29
LV E/e'average: 9.29
LV TDI E'LATERAL: 8.92
LV dias vol index: 39 mL/m2
LV e' LATERAL: 8.92 cm/s
LV sys vol: 29 mL
LVDIAVOL: 78 mL (ref 46–106)
LVOT SV: 92 mL
LVOT diameter: 21 mm
LVOTPV: 108 cm/s
LVSYSVOLIN: 14 mL/m2
MV Peak grad: 3 mmHg
MV pk E vel: 82.9 m/s
MVPKAVEL: 91.7 m/s
PW: 10.3 mm — AB (ref 0.6–1.1)
RV LATERAL S' VELOCITY: 11.9 cm/s
Reg peak vel: 256 cm/s
Simpson's disk: 63
TAPSE: 20 mm
TDI e' medial: 5.77
TRMAXVEL: 256 cm/s

## 2017-01-10 NOTE — Progress Notes (Signed)
*  PRELIMINARY RESULTS* Echocardiogram 2D Echocardiogram has been performed.  Stacey Drain 01/10/2017, 11:10 AM

## 2017-02-03 ENCOUNTER — Ambulatory Visit (INDEPENDENT_AMBULATORY_CARE_PROVIDER_SITE_OTHER): Payer: 59 | Admitting: Internal Medicine

## 2017-02-03 ENCOUNTER — Encounter: Payer: Self-pay | Admitting: Internal Medicine

## 2017-02-03 VITALS — BP 146/84 | HR 84 | Ht 66.5 in | Wt 183.0 lb

## 2017-02-03 DIAGNOSIS — I10 Essential (primary) hypertension: Secondary | ICD-10-CM

## 2017-02-03 DIAGNOSIS — I480 Paroxysmal atrial fibrillation: Secondary | ICD-10-CM | POA: Diagnosis not present

## 2017-02-03 MED ORDER — APIXABAN 5 MG PO TABS: 5.0000 mg | ORAL_TABLET | Freq: Two times a day (BID) | ORAL | 11 refills | Status: DC

## 2017-02-03 NOTE — Patient Instructions (Signed)
Medication Instructions:  Your physician recommends that you continue on your current medications as directed. Please refer to the Current Medication list given to you today.   Labwork: NONE  Testing/Procedures: NONE   Follow-Up: Your physician wants you to follow-up in: 6 Months with Dr. Taylor.  You will receive a reminder letter in the mail two months in advance. If you don't receive a letter, please call our office to schedule the follow-up appointment.   Any Other Special Instructions Will Be Listed Below (If Applicable).     If you need a refill on your cardiac medications before your next appointment, please call your pharmacy.  Thank you for choosing [facility]!   

## 2017-02-03 NOTE — Progress Notes (Signed)
HPI Mrs. Pastorek returns today for ongoing evaluation and management of her paroxysmal atrial fibrillation. She is a very pleasant 66 year old woman who has saw back in the summer time for worsening atrial fibrillation. She has been taking a combination of diltiazem and flecainide with fairly good control of her arrhythmia. At that time I had her undergo a 2-D echo which demonstrated left ventricular hypertrophy but only minimally enlarged left atria. At that time, she had been under increased stress which has improved. She has dyspnea with exertion. Otherwise she has been stable. No Known Allergies   Current Outpatient Prescriptions  Medication Sig Dispense Refill  . apixaban (ELIQUIS) 5 MG TABS tablet Take 1 tablet (5 mg total) by mouth 2 (two) times daily. 60 tablet 11  . calcium carbonate (OS-CAL) 600 MG TABS Take 600 mg by mouth daily.      Marland Kitchen diltiazem (CARDIZEM CD) 180 MG 24 hr capsule Take 1 capsule (180 mg total) by mouth daily. 120 capsule 3  . diltiazem (CARDIZEM) 30 MG tablet Take 1 tablet (30 mg total) by mouth every 6 (six) hours as needed. Take 1 to 2 tablets every 6 hrs. as needed for palpitations 30 tablet 6  . ergocalciferol (VITAMIN D2) 50000 UNITS capsule Take 50,000 Units by mouth once a week.      . estradiol (VIVELLE-DOT) 0.1 MG/24HR patch PLACE ONE PATCH ONTO THE SKIN TWO TIMES A WEEK 24 patch 4  . flecainide (TAMBOCOR) 100 MG tablet Take 1 tablet (100 mg total) by mouth 2 (two) times daily. 180 tablet 3  . Magnesium 250 MG TABS Take 1 tablet by mouth at bedtime.     . Multiple Vitamins-Minerals (MULTIVITAMIN WITH MINERALS) tablet Take 1 tablet by mouth daily.      . Multiple Vitamins-Minerals (PRESERVISION AREDS 2) CAPS Take 1 capsule by mouth 2 (two) times daily.    . Omega-3 Fatty Acids (FISH OIL PO) Take 1 capsule by mouth daily.      No current facility-administered medications for this visit.      Past Medical History:  Diagnosis Date  . Atrial  fibrillation (HCC)   . Current use of estrogen therapy 01/31/2013  . Dysrhythmia    AFib  . Hypertension   . Macular degeneration   . Menopausal syndrome   . Right ovarian cyst 01/25/2014    ROS:   All systems reviewed and negative except as noted in the HPI.   Past Surgical History:  Procedure Laterality Date  . ABDOMINAL HYSTERECTOMY    . APPENDECTOMY    . CARDIOVERSION N/A 09/14/2016   Procedure: CARDIOVERSION;  Surgeon: [name], MD;  Location: [facility];  Service: Cardiovascular;  Laterality: N/A;  . CATARACT EXTRACTION W/ INTRAOCULAR LENS  IMPLANT, BILATERAL Bilateral   . COLONOSCOPY  12/04/03   Friable anal canal otherwise normal rectum and colon  . COLONOSCOPY N/A 03/19/2013   Procedure: COLONOSCOPY;  Surgeon: [name], MD;  Location: [facility];  Service: Endoscopy;  Laterality: N/A;  1:15  . TEE WITHOUT CARDIOVERSION N/A 09/14/2016   Procedure: TRANSESOPHAGEAL ECHOCARDIOGRAM (TEE) WITH PROPOFOL;  Surgeon: [name], MD;  Location: [facility];  Service: Cardiovascular;  Laterality: N/A;  . TONSILECTOMY, ADENOIDECTOMY, BILATERAL MYRINGOTOMY AND TUBES       Family History  Problem Relation Age of Onset  . Coronary artery disease Father   . Cancer Father        bladder cancer  . Arrhythmia Sister  Reportedly had ablation of SVT  . Hypertension Sister   . Colon cancer Maternal Grandmother   . Hypertension Son      Social History   Social History  . Marital status: Married    Spouse name: N/A  . Number of children: N/A  . Years of education: N/A   Occupational History  . Nurse     [employer]   Social History Main Topics  . Smoking status: Never Smoker  . Smokeless tobacco: Never Used     Comment: Never smoked  . Alcohol use 0.0 oz/week     Comment: Rarely glass of wine  . Drug use: No  . Sexual activity: Yes    Birth control/ protection: Surgical     Comment: hyst   Other Topics Concern  . Not on file   Social  History Narrative  . No narrative on file     BP (!) 146/84 (BP Location: Left Arm)   Pulse 84   Ht 5' 6.5" (1.689 m)   Wt 183 lb (83 kg)   SpO2 97%   BMI 29.09 kg/m   Physical Exam:  Well appearing 66 year old woman,NAD HEENT: Unremarkable Neck:  6 cm JVD, no thyromegally Lymphatics:  No adenopathy Back:  No CVA tenderness Lungs:  Clear, with no wheezes, rales, or rhonchi HEART:  Regular rate rhythm, no murmurs, no rubs, no clicks Abd:  soft, positive bowel sounds, no organomegally, no rebound, no guarding Ext:  2 plus pulses, no edema, no cyanosis, no clubbing Skin:  No rashes no nodules Neuro:  CN II through XII intact, motor grossly intact   DEVICE  Normal device function.  See PaceArt for details.   Assess/Plan: 1. Paroxysmal atrial fibrillation - her symptoms are recently well controlled. We discussed the treatment options. For now she will continue her flecainide and her calcium channel blocker. Catheter ablation would be an option in the future if her episodes of atrial fibrillation increase in frequency and severity. She clearly correlates worsening atrial fibrillation with increased stress, and this appears to be improving. 2. Left ventricular hypertrophy by echo - we discussed treatment options. At this point, she probably has a mild amount of diastolic heart failure. We discussed weight loss, a low-salt diet, and regular daily exercise specifically walking. 3. Obesity - I've asked the patient to lose 7 pounds in the next 6 months. 4. Hypertension - her systolic blood pressure is a little high. She will increase her exercise, try to lose weight, and reduce her salt intake. If none of these help her blood pressure, we would consider up titration of medical therapy with a diuretic.  Lewayne BuntingGregg Patrich Heinze, M.D.

## 2017-03-11 ENCOUNTER — Other Ambulatory Visit (HOSPITAL_COMMUNITY): Payer: Self-pay | Admitting: Family Medicine

## 2017-03-11 DIAGNOSIS — Z1231 Encounter for screening mammogram for malignant neoplasm of breast: Secondary | ICD-10-CM

## 2017-04-15 ENCOUNTER — Ambulatory Visit (HOSPITAL_COMMUNITY)
Admission: RE | Admit: 2017-04-15 | Discharge: 2017-04-15 | Disposition: A | Discharge status: Home / Self Care | Payer: 59 | Source: Ambulatory Visit | Attending: Family Medicine | Admitting: Family Medicine

## 2017-04-15 DIAGNOSIS — Z1231 Encounter for screening mammogram for malignant neoplasm of breast: Secondary | ICD-10-CM | POA: Diagnosis not present

## 2017-04-15 IMAGING — MG 2D DIGITAL SCREENING BILATERAL MAMMOGRAM WITH 3D TOMO WITH CAD
8 series · 8 of 24 positions shown · non-contrast
Comparison: Previous exam(s).

CLINICAL DATA: Screening.

EXAM:
2D DIGITAL SCREENING BILATERAL MAMMOGRAM WITH 3D TOMO WITH CAD

[R CC]
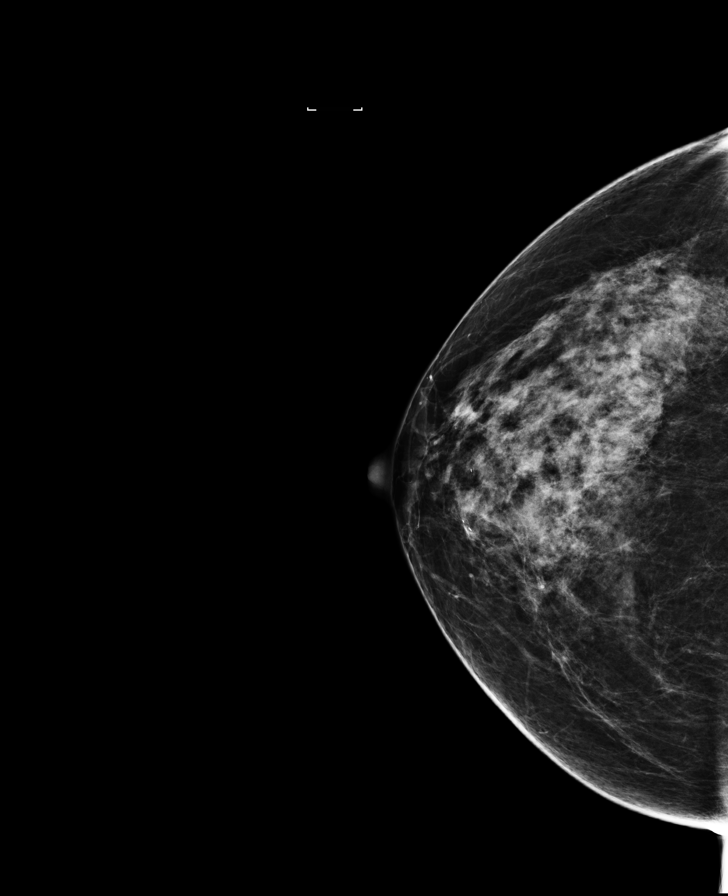

[R MLO]
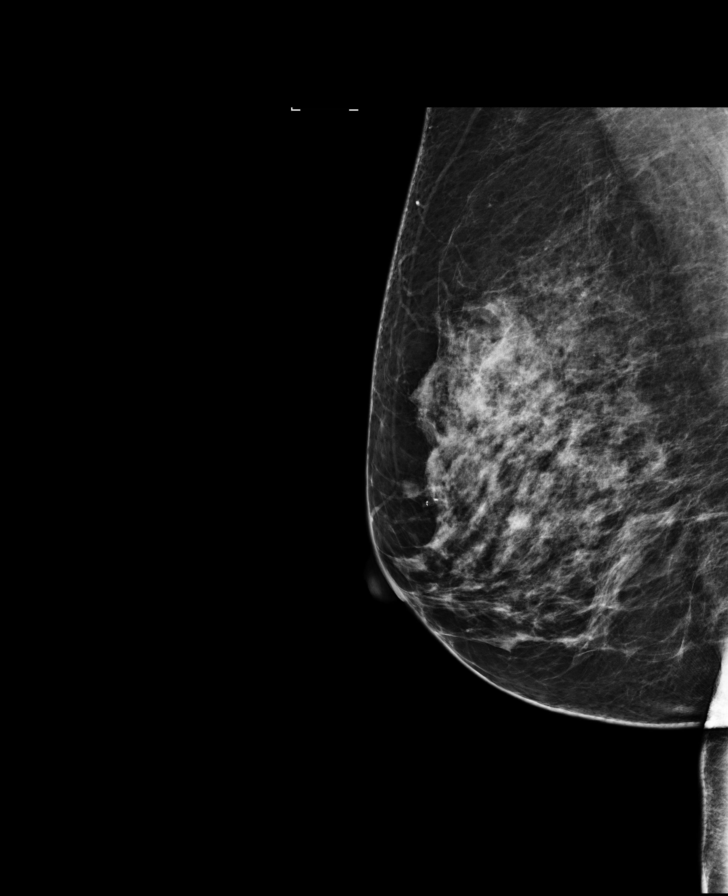

[L CC]
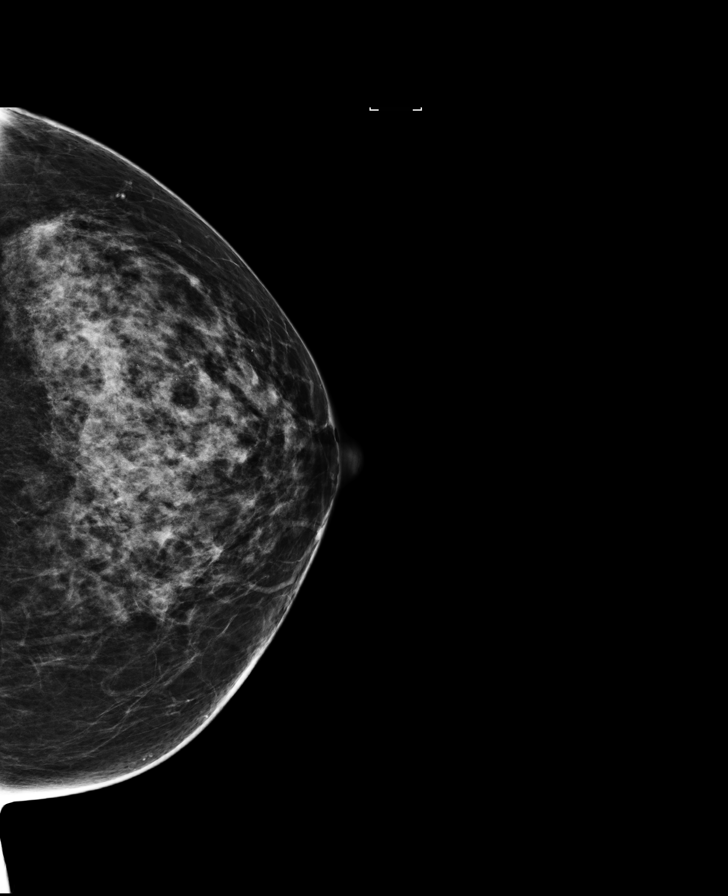

[L MLO]
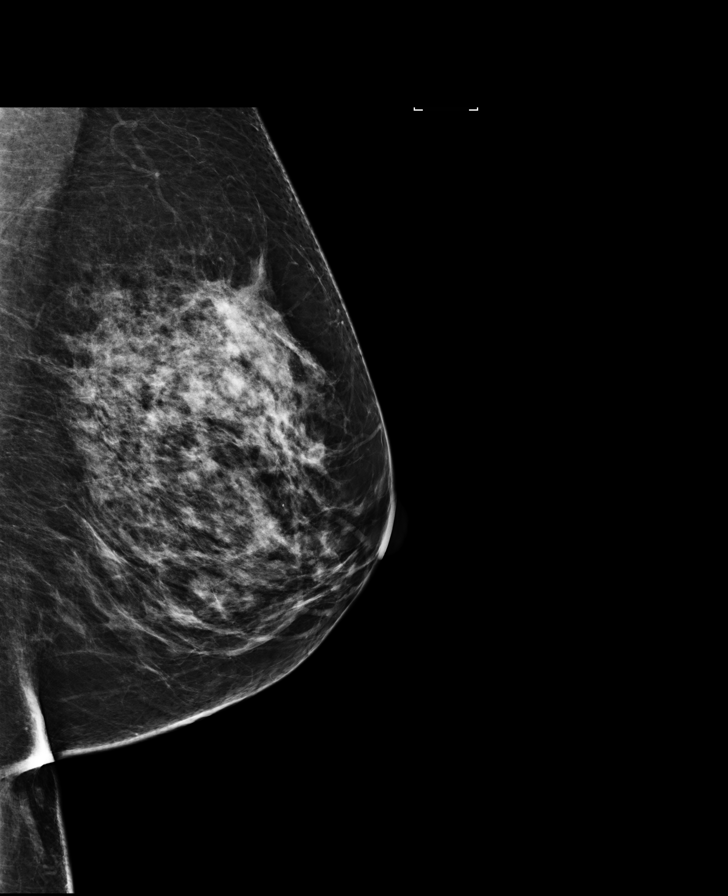

[R MLO tomo · tomo slice 40/79.0]
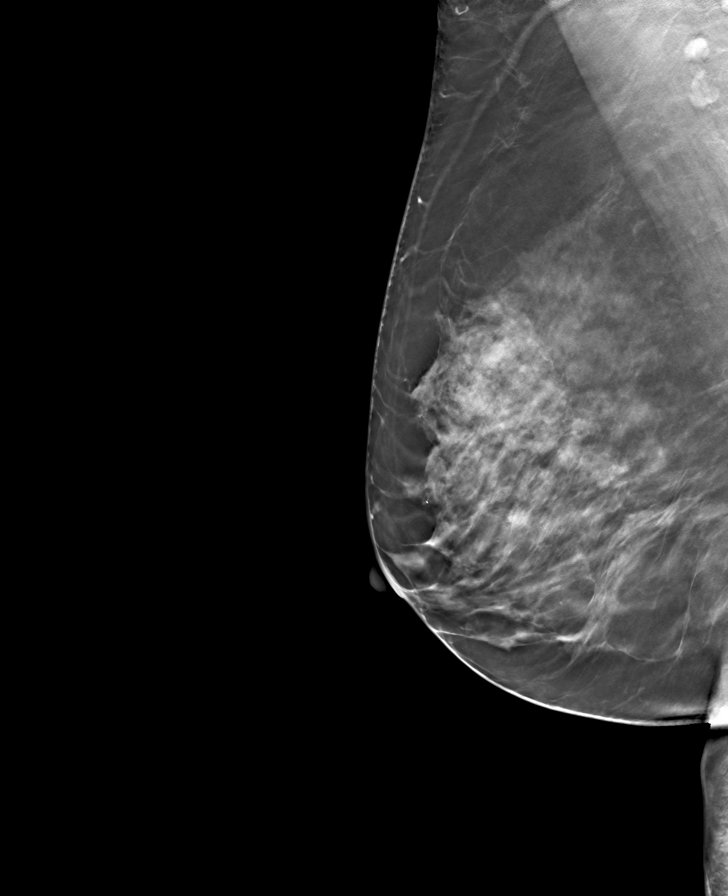

[R CC tomo · tomo slice 37/73.0]
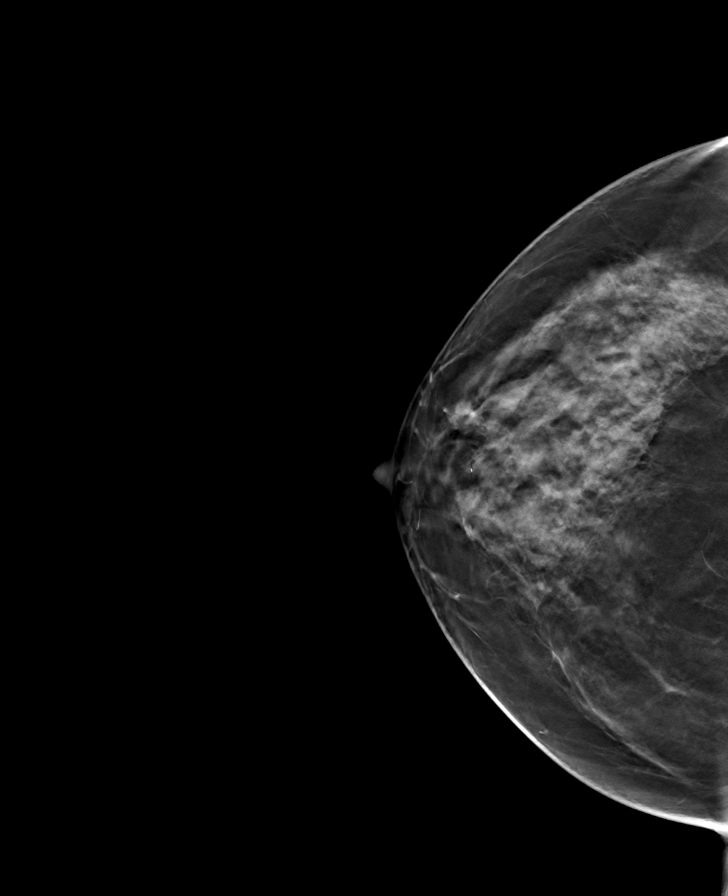

[L CC tomo · tomo slice 39/78.0]
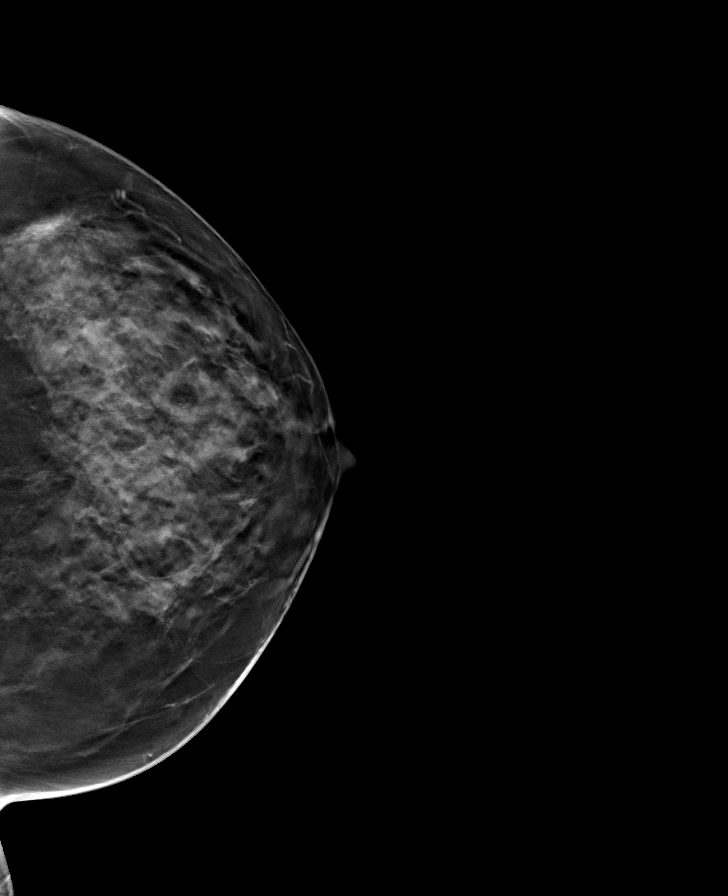

[L MLO tomo · tomo slice 40/79.0]
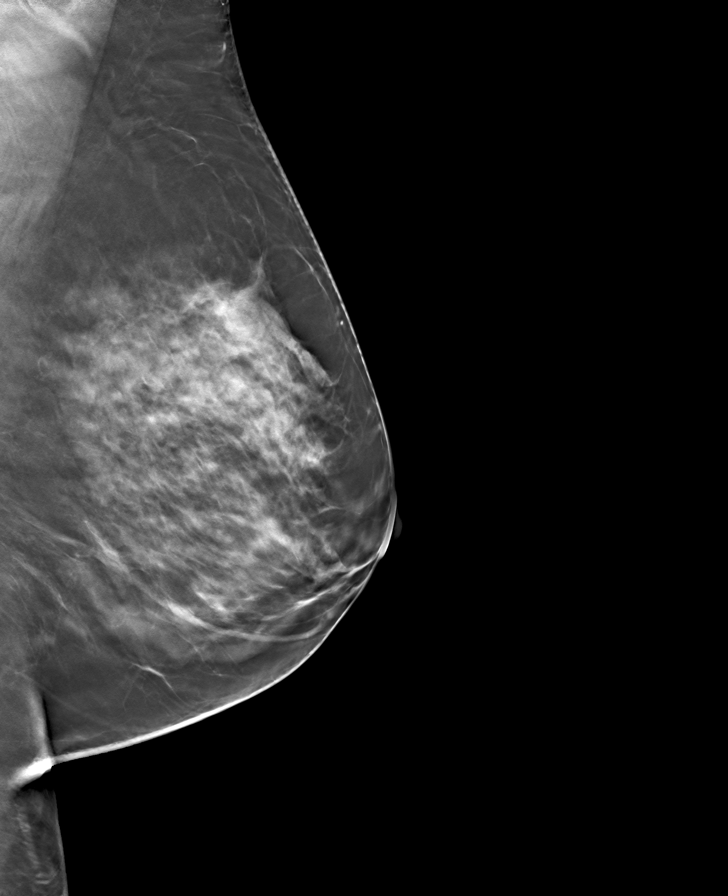

[8 of 24 positions shown; findings below may reference images not displayed]

ACR Breast Density Category c: The breast tissue is heterogeneously
dense, which may obscure small masses.
FINDINGS: There are no findings suspicious for malignancy. Images were
processed with CAD.
IMPRESSION: No mammographic evidence of malignancy. A result letter of this
screening mammogram will be mailed directly to the patient.

RECOMMENDATION:
Screening mammogram in one year. (Code:[4B])

BI-RADS CATEGORY  1: Negative.

## 2017-04-25 ENCOUNTER — Ambulatory Visit (INDEPENDENT_AMBULATORY_CARE_PROVIDER_SITE_OTHER): Payer: 59 | Admitting: Adult Health

## 2017-04-25 ENCOUNTER — Encounter: Payer: Self-pay | Admitting: Adult Health

## 2017-04-25 VITALS — BP 160/88 | HR 68 | Ht 66.25 in | Wt 178.5 lb

## 2017-04-25 DIAGNOSIS — Z8742 Personal history of other diseases of the female genital tract: Secondary | ICD-10-CM | POA: Diagnosis not present

## 2017-04-25 DIAGNOSIS — Z1212 Encounter for screening for malignant neoplasm of rectum: Secondary | ICD-10-CM | POA: Diagnosis not present

## 2017-04-25 DIAGNOSIS — Z1211 Encounter for screening for malignant neoplasm of colon: Secondary | ICD-10-CM

## 2017-04-25 DIAGNOSIS — R350 Frequency of micturition: Secondary | ICD-10-CM | POA: Diagnosis not present

## 2017-04-25 DIAGNOSIS — Z01419 Encounter for gynecological examination (general) (routine) without abnormal findings: Secondary | ICD-10-CM | POA: Insufficient documentation

## 2017-04-25 DIAGNOSIS — Z01411 Encounter for gynecological examination (general) (routine) with abnormal findings: Secondary | ICD-10-CM | POA: Diagnosis not present

## 2017-04-25 LAB — HEMOCCULT GUIAC POC 1CARD (OFFICE): Fecal Occult Blood, POC: NEGATIVE

## 2017-04-25 LAB — POCT URINALYSIS DIPSTICK
Blood, UA: NEGATIVE
Glucose, UA: NEGATIVE
KETONES UA: NEGATIVE
Leukocytes, UA: NEGATIVE
NITRITE UA: NEGATIVE
PROTEIN UA: NEGATIVE

## 2017-04-25 NOTE — Progress Notes (Signed)
Patient ID: Renee Benitez, female   DOB: 1951/02/09, 67 y.o.   MRN: [id number] History of Present Illness: Renee Benitez is a 67 year old white female, married, sp hysterectomy in for well woman gyn exam.She had cardioversion this past summer for Afib.  PCP is Dr Sudie Bailey.   Current Medications, Allergies, Past Medical History, Past Surgical History, Family History and Social History were reviewed in [employer] record.     Review of Systems: Patient denies any headaches, hearing loss, fatigue, blurred vision, shortness of breath, chest pain, abdominal pain, problems with bowel movements, or intercourse. No joint pain or mood swings. +urinary frequency and some UI   Physical Exam:BP (!) 160/88 (BP Location: Left Arm, Patient Position: Sitting, Cuff Size: Normal)   Pulse 68   Ht 5' 6.25" (1.683 m)   Wt 178 lb 8 oz (81 kg)   BMI 28.59 kg/m Urine negative.  General:  Well developed, well nourished, no acute distress Skin:  Warm and dry Neck:  Midline trachea, normal thyroid, good ROM, no lymphadenopathy Lungs; Clear to auscultation bilaterally Breast:  No dominant palpable mass, retraction, or nipple discharge Cardiovascular: Regular rate and rhythm Abdomen:  Soft, non tender, no hepatosplenomegaly Pelvic:  External genitalia is normal in appearance, no lesions.  The vagina is normal in appearance. Urethra has no lesions or masses. The cervix and uterus are absent.   No adnexal masses or tenderness noted.Bladder is non tender, no masses felt. Rectal: Good sphincter tone, no polyps, or hemorrhoids felt.  Hemoccult negative. Extremities/musculoskeletal:  No swelling or varicosities noted, no clubbing or cyanosis Psych:  No mood changes, alert and cooperative,seems happy PHQ 2 score 0.  Impression: 1. Well female exam with routine gynecological exam   2. Urinary frequency   3. Screening for colorectal cancer   4. History of ovarian cyst       Plan: Physical in 1   Year Return in 1 week for GYN [place] F/U on ovarian cyst Labs with PCP Mammogram yearly

## 2017-05-05 ENCOUNTER — Ambulatory Visit (INDEPENDENT_AMBULATORY_CARE_PROVIDER_SITE_OTHER): Payer: 59

## 2017-05-05 ENCOUNTER — Telehealth: Payer: Self-pay | Admitting: Adult Health

## 2017-05-05 DIAGNOSIS — N83201 Unspecified ovarian cyst, right side: Secondary | ICD-10-CM

## 2017-05-05 DIAGNOSIS — Z8742 Personal history of other diseases of the female genital tract: Secondary | ICD-10-CM

## 2017-05-05 NOTE — Progress Notes (Signed)
PELVIC US TA/TV: normal vaginal cuff,left oophorectomy,left adnexal wnl,septated right ovarian cyst 3.2 x 2.2 x 2.3 cm (no significant change from previous ultrasound),no free fluid,no pain during ultrasound,right ovary appears mobile

## 2017-05-05 NOTE — Telephone Encounter (Signed)
No answer

## 2017-05-09 ENCOUNTER — Telehealth: Payer: Self-pay | Admitting: Adult Health

## 2017-05-09 NOTE — Telephone Encounter (Signed)
Pt aware simple cyst right ovary, no F/U needed

## 2017-06-12 ENCOUNTER — Other Ambulatory Visit: Payer: Self-pay | Admitting: Cardiology

## 2017-08-11 ENCOUNTER — Ambulatory Visit: Payer: 59 | Admitting: Internal Medicine

## 2017-08-11 ENCOUNTER — Encounter: Payer: Self-pay | Admitting: Internal Medicine

## 2017-08-11 VITALS — BP 160/100 | HR 67 | Ht 66.5 in | Wt 178.4 lb

## 2017-08-11 DIAGNOSIS — I480 Paroxysmal atrial fibrillation: Secondary | ICD-10-CM | POA: Diagnosis not present

## 2017-08-11 MED ORDER — APIXABAN 5 MG PO TABS: 5.0000 mg | ORAL_TABLET | Freq: Two times a day (BID) | ORAL | 11 refills | Status: DC

## 2017-08-11 MED ORDER — FLECAINIDE ACETATE 100 MG PO TABS: 100.0000 mg | ORAL_TABLET | Freq: Two times a day (BID) | ORAL | 3 refills | Status: DC

## 2017-08-11 MED ORDER — DILTIAZEM HCL ER COATED BEADS 180 MG PO CP24: 180.0000 mg | ORAL_CAPSULE | Freq: Every day | ORAL | 3 refills | Status: DC

## 2017-08-11 NOTE — Patient Instructions (Signed)
Medication Instructions:  Your physician recommends that you continue on your current medications as directed. Please refer to the Current Medication list given to you today.   Labwork: NONE   Testing/Procedures: NONE   Follow-Up: Your physician wants you to follow-up in: 1 year with Dr. Taylor. You will receive a reminder letter in the mail two months in advance. If you don't receive a letter, please call our office to schedule the follow-up appointment.   Any Other Special Instructions Will Be Listed Below (If Applicable).     If you need a refill on your cardiac medications before your next appointment, please call your pharmacy.  Thank you for choosing [facility]!   

## 2017-08-11 NOTE — Progress Notes (Signed)
HPI Renee Benitez returns today for ongoing evaluation and management of paroxysmal atrial fibrillation and hypertension.  She also has obesity.  The patient has maintained sinus rhythm fairly nicely on flecainide therapy.  She denies chest pain, shortness of breath, or syncope.  She recently returned from [place]  She denies chest pain or shortness of breath.  She is tried to increase her physical activity but not been successful and has not been able lose weight.  Otherwise she feels well. No Known Allergies   Current Outpatient Medications  Medication Sig Dispense Refill  . apixaban (ELIQUIS) 5 MG TABS tablet Take 1 tablet (5 mg total) by mouth 2 (two) times daily. 60 tablet 11  . calcium carbonate (OS-CAL) 600 MG TABS Take 600 mg by mouth daily.      Marland Kitchen diltiazem (CARDIZEM CD) 180 MG 24 hr capsule Take 1 capsule (180 mg total) by mouth daily. 120 capsule 3  . diltiazem (CARDIZEM) 30 MG tablet Take 1 tablet (30 mg total) by mouth every 6 (six) hours as needed. Take 1 to 2 tablets every 6 hrs. as needed for palpitations 30 tablet 6  . ergocalciferol (VITAMIN D2) 50000 UNITS capsule Take 50,000 Units by mouth once a week.      . flecainide (TAMBOCOR) 100 MG tablet Take 1 tablet (100 mg total) by mouth 2 (two) times daily. 180 tablet 3  . Magnesium 250 MG TABS Take 1 tablet by mouth at bedtime.     . Multiple Vitamins-Minerals (MULTIVITAMIN WITH MINERALS) tablet Take 1 tablet by mouth daily.      . Multiple Vitamins-Minerals (PRESERVISION AREDS 2) CAPS Take 1 capsule by mouth 2 (two) times daily.    . Omega-3 Fatty Acids (FISH OIL PO) Take 1 capsule by mouth daily.      No current facility-administered medications for this visit.      Past Medical History:  Diagnosis Date  . Atrial fibrillation (HCC)   . Current use of estrogen therapy 01/31/2013  . Dysrhythmia    AFib  . Hypertension   . Macular degeneration   .  Menopausal syndrome   . Right ovarian cyst 01/25/2014    ROS:   All systems reviewed and negative except as noted in the HPI.   Past Surgical History:  Procedure Laterality Date  . ABDOMINAL HYSTERECTOMY    . APPENDECTOMY    . CARDIOVERSION N/A 09/14/2016   Procedure: CARDIOVERSION;  Surgeon: [name], MD;  Location: [facility];  Service: Cardiovascular;  Laterality: N/A;  . CATARACT EXTRACTION W/ INTRAOCULAR LENS  IMPLANT, BILATERAL Bilateral   . COLONOSCOPY  12/04/03   Friable anal canal otherwise normal rectum and colon  . COLONOSCOPY N/A 03/19/2013   Procedure: COLONOSCOPY;  Surgeon: [name], MD;  [facility];  Service: Endoscopy;  Laterality: N/A;  1:15  . TEE WITHOUT CARDIOVERSION N/A 09/14/2016   Procedure: TRANSESOPHAGEAL ECHOCARDIOGRAM (TEE) WITH PROPOFOL;  Surgeon: [name], MD;  Location: [facility];  Service: Cardiovascular;  Laterality: N/A;  . TONSILECTOMY, ADENOIDECTOMY, BILATERAL MYRINGOTOMY AND TUBES       Family History  Problem Relation Age of Onset  . Coronary artery disease Father   . Cancer Father        bladder cancer  . Arrhythmia Sister        Reportedly had ablation of SVT  . Hypertension Sister   . Colon cancer Maternal Grandmother   .  Hypertension Son      Social History   Socioeconomic History  . Marital status: Married    Spouse name: Not on file  . Number of children: Not on file  . Years of education: Not on file  . Highest education level: Not on file  Occupational History  . Occupation: Nurse    Comment: Copywriter, advertising  Social Needs  . Financial resource strain: Not on file  . Food insecurity:    Worry: Not on file    Inability: Not on file  . Transportation needs:    Medical: Not on file    Non-medical: Not on file  Tobacco Use  . Smoking status: Never Smoker  . Smokeless tobacco: Never Used  . Tobacco comment: Never smoked  Substance and Sexual Activity  . Alcohol use: No    Alcohol/week:  0.0 oz    Frequency: Never  . Drug use: No  . Sexual activity: Yes    Birth control/protection: Surgical    Comment: hyst  Lifestyle  . Physical activity:    Days per week: Not on file    Minutes per session: Not on file  . Stress: Not on file  Relationships  . Social connections:    Talks on phone: Not on file    Gets together: Not on file    Attends religious service: Not on file    Active member of club or organization: Not on file    Attends meetings of clubs or organizations: Not on file    Relationship status: Not on file  . Intimate partner violence:    Fear of current or ex partner: Not on file    Emotionally abused: Not on file    Physically abused: Not on file    Forced sexual activity: Not on file  Other Topics Concern  . Not on file  Social History Narrative  . Not on file     BP (!) 160/100   Pulse 67   Ht 5' 6.5" (1.689 m)   Wt 178 lb 6.4 oz (80.9 kg)   SpO2 94% Comment: on room air  BMI 28.36 kg/m   Physical Exam:  Well appearing 67 year old woman, NAD HEENT: Unremarkable Neck:  No JVD, no thyromegally Lymphatics:  No adenopathy Back:  No CVA tenderness Lungs:  Clear, with no wheezes, rales, or rhonchi HEART:  Regular rate rhythm, no murmurs, no rubs, no clicks Abd:  soft, positive bowel sounds, no organomegally, no rebound, no guarding Ext:  2 plus pulses, no edema, no cyanosis, no clubbing Skin:  No rashes no nodules Neuro:  CN II through XII intact, motor grossly intact  EKG normal sinus rhythm with a QRS duration of 100 ms.  Assess/Plan: 1.  Paroxysmal atrial fibrillation -she remains asymptomatic on flecainide therapy.  She will continue her current medications.  Her QRS duration is good. 2.  Hypertension -her blood pressure was 140/78 on my exam.  She tends to run in the 140s to 130s at home.  I asked the patient to lose weight and reduce her salt intake and exercise more. 3.  Obesity -she is actually overweight and not considered obese  technically as her BMI is under 30.  That said, she is encouraged to lose more weight.  Leonia Reeves.D.

## 2017-08-31 ENCOUNTER — Encounter: Payer: Self-pay | Admitting: Internal Medicine

## 2018-01-19 DIAGNOSIS — Z23 Encounter for immunization: Secondary | ICD-10-CM | POA: Diagnosis not present

## 2018-02-09 ENCOUNTER — Encounter: Payer: Self-pay | Admitting: Internal Medicine

## 2018-04-20 ENCOUNTER — Other Ambulatory Visit (HOSPITAL_COMMUNITY): Payer: Self-pay | Admitting: Adult Health

## 2018-04-20 DIAGNOSIS — Z1231 Encounter for screening mammogram for malignant neoplasm of breast: Secondary | ICD-10-CM

## 2018-04-27 ENCOUNTER — Ambulatory Visit (HOSPITAL_COMMUNITY)
Admission: RE | Admit: 2018-04-27 | Discharge: 2018-04-27 | Disposition: A | Discharge status: Home / Self Care | Payer: Medicare Other | Source: Ambulatory Visit | Attending: Adult Health | Admitting: Adult Health

## 2018-04-27 ENCOUNTER — Encounter (HOSPITAL_COMMUNITY): Payer: Self-pay

## 2018-04-27 DIAGNOSIS — Z1231 Encounter for screening mammogram for malignant neoplasm of breast: Secondary | ICD-10-CM | POA: Insufficient documentation

## 2018-04-27 IMAGING — MG DIGITAL SCREENING BILATERAL MAMMOGRAM WITH TOMO AND CAD
6 of 10 series · 6 of 30 positions shown · non-contrast
Comparison: Previous exam(s).

CLINICAL DATA: Screening.

EXAM:
DIGITAL SCREENING BILATERAL MAMMOGRAM WITH TOMO AND CAD

[L CC synth-2D]
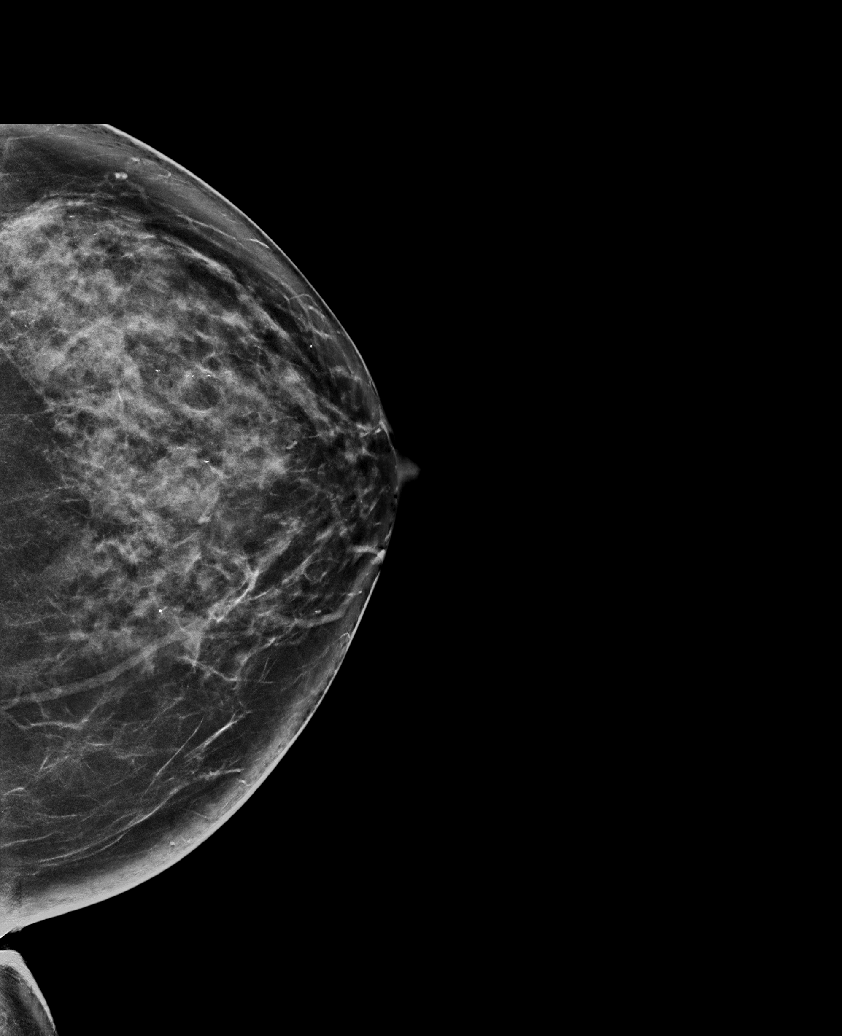

[R CC synth-2D]
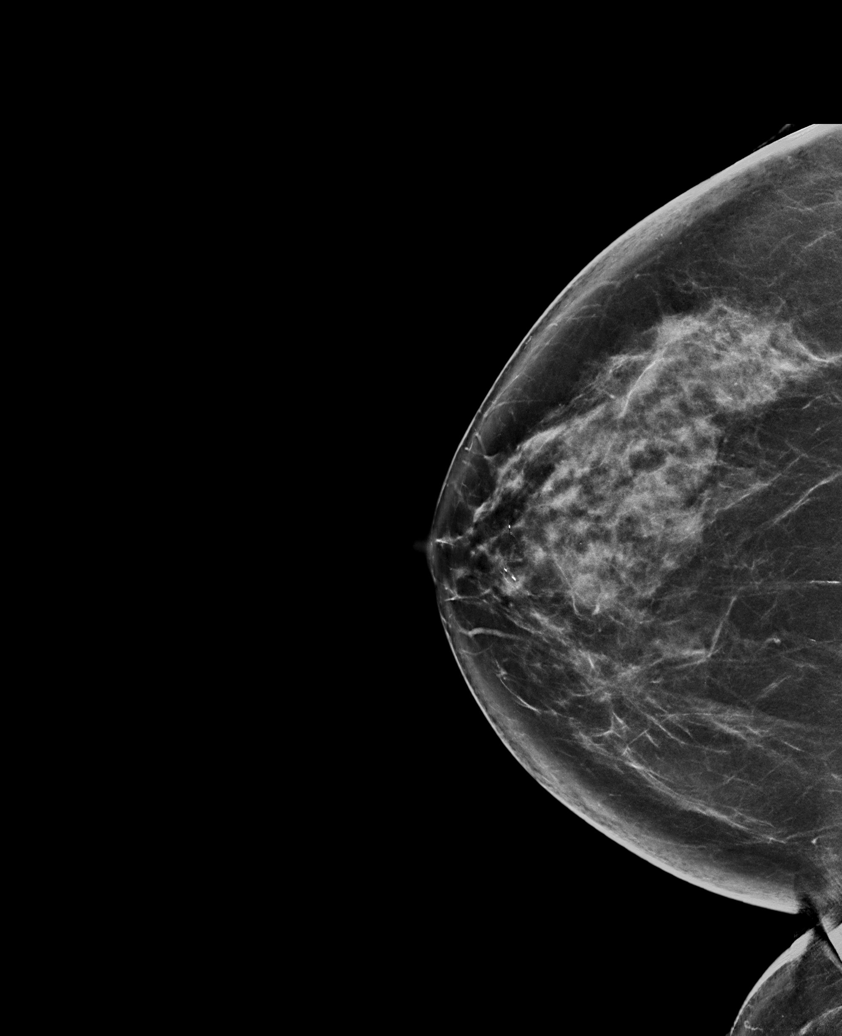

[R MLO synth-2D (1 of 2)]
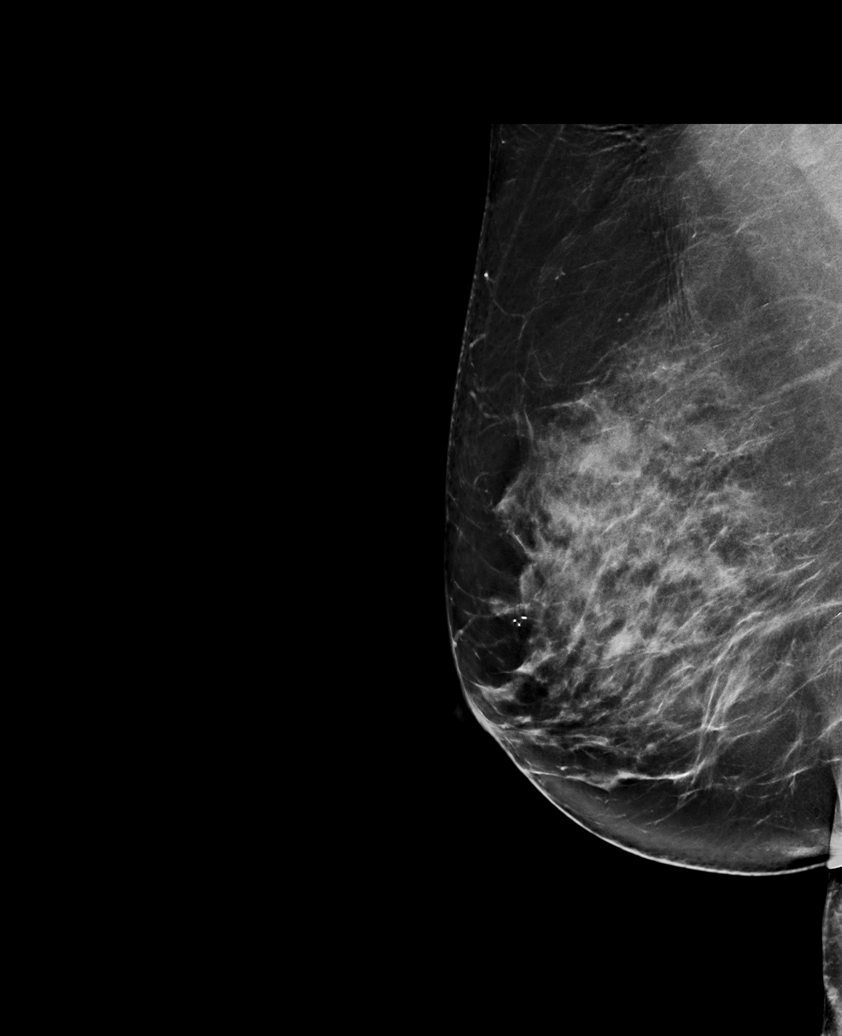

[R MLO synth-2D (2 of 2)]
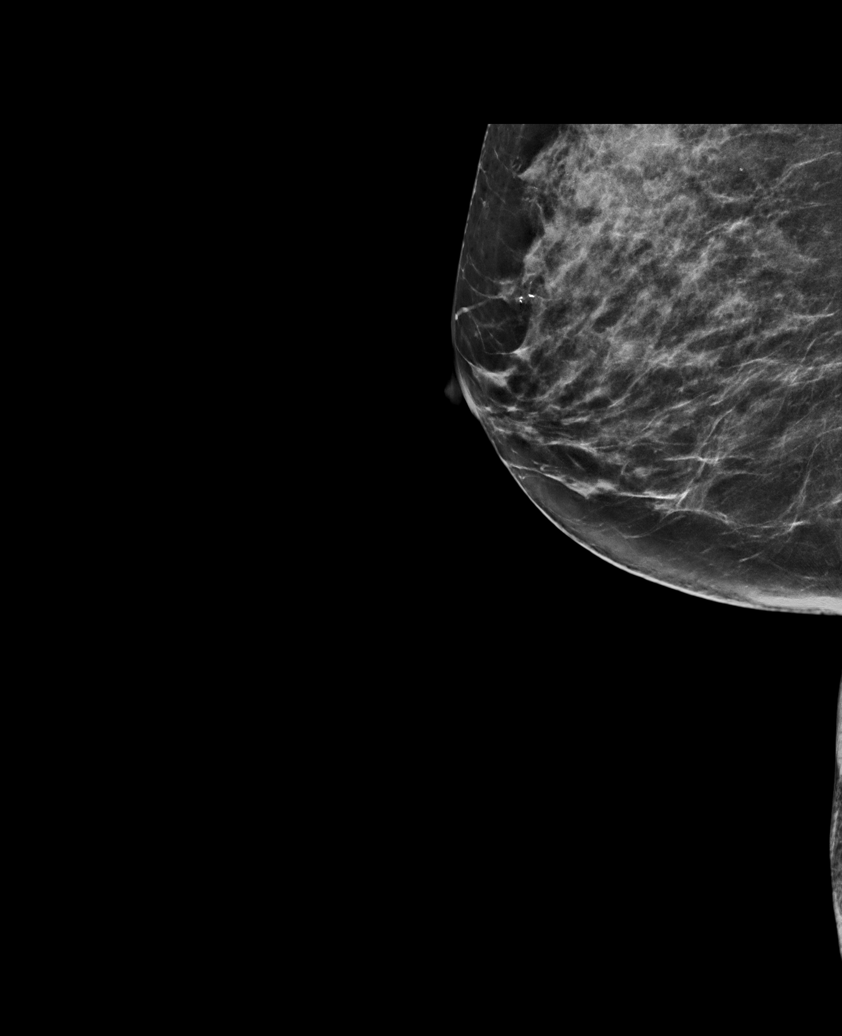

[L MLO synth-2D]
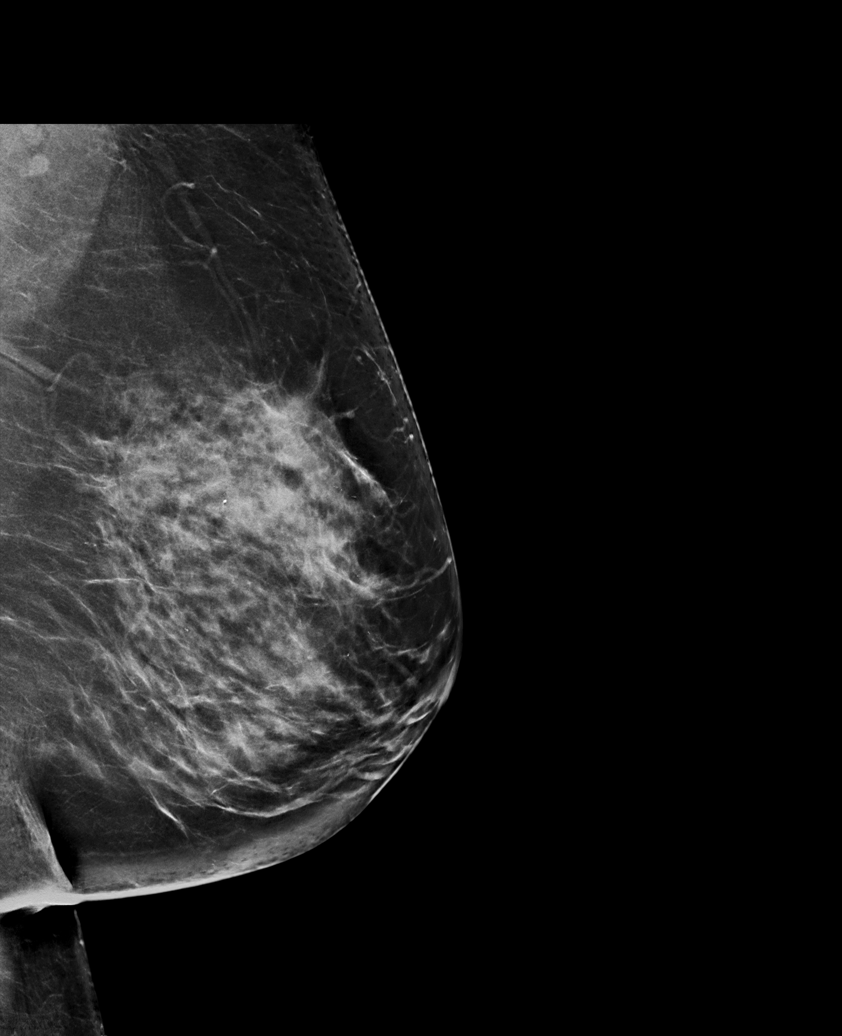

[L CC tomo · tomo slice 42/83.0]
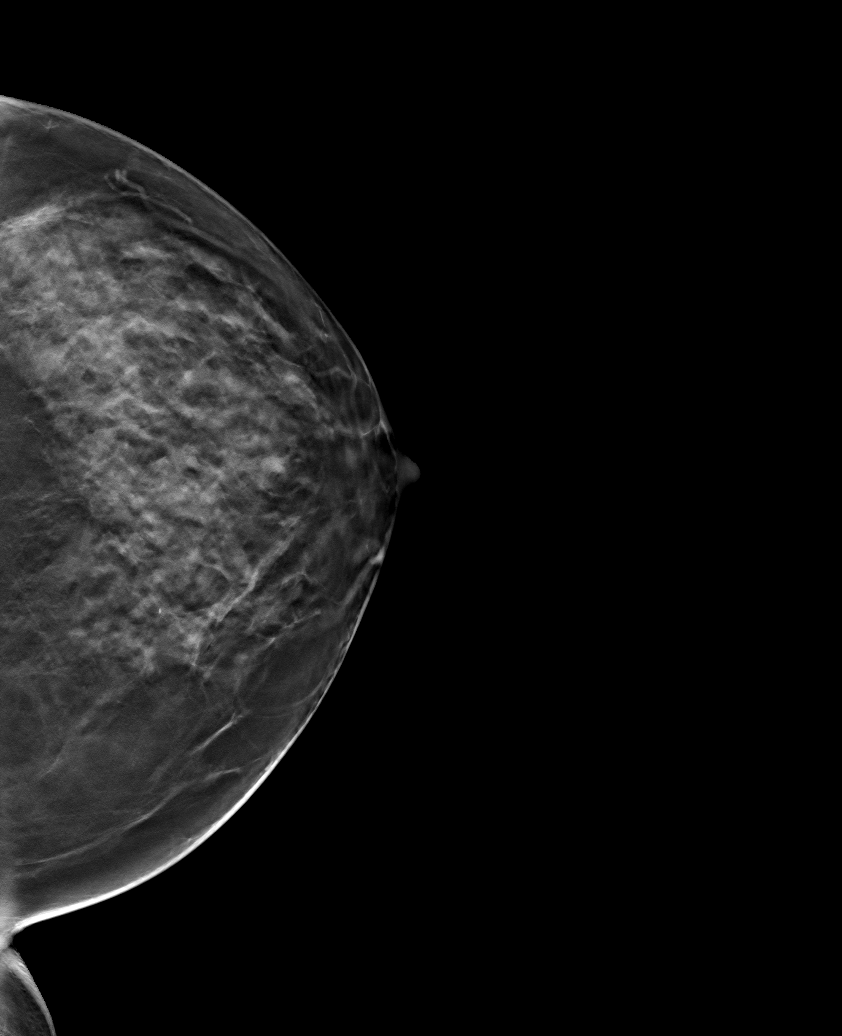

[6 of 30 positions shown; findings below may reference images not displayed]

ACR Breast Density Category c: The breast tissue is heterogeneously
dense, which may obscure small masses.
FINDINGS: There are no findings suspicious for malignancy. Images were
processed with CAD.
IMPRESSION: No mammographic evidence of malignancy. A result letter of this
screening mammogram will be mailed directly to the patient.

RECOMMENDATION:
Screening mammogram in one year. (Code:[5V])

BI-RADS CATEGORY  1: Negative.

## 2018-06-01 DIAGNOSIS — E559 Vitamin D deficiency, unspecified: Secondary | ICD-10-CM | POA: Diagnosis not present

## 2018-06-01 DIAGNOSIS — R002 Palpitations: Secondary | ICD-10-CM | POA: Diagnosis not present

## 2018-06-01 DIAGNOSIS — I482 Chronic atrial fibrillation, unspecified: Secondary | ICD-10-CM | POA: Diagnosis not present

## 2018-06-01 DIAGNOSIS — E663 Overweight: Secondary | ICD-10-CM | POA: Diagnosis not present

## 2018-06-06 ENCOUNTER — Ambulatory Visit (INDEPENDENT_AMBULATORY_CARE_PROVIDER_SITE_OTHER): Payer: Medicare Other | Admitting: Gastroenterology

## 2018-06-06 ENCOUNTER — Encounter: Payer: Self-pay | Admitting: *Deleted

## 2018-06-06 ENCOUNTER — Encounter: Payer: Self-pay | Admitting: Gastroenterology

## 2018-06-06 ENCOUNTER — Other Ambulatory Visit: Payer: Self-pay | Admitting: *Deleted

## 2018-06-06 DIAGNOSIS — R1319 Other dysphagia: Secondary | ICD-10-CM

## 2018-06-06 DIAGNOSIS — R1310 Dysphagia, unspecified: Secondary | ICD-10-CM

## 2018-06-06 NOTE — Progress Notes (Signed)
Primary Care Physician: Gareth Morgan, MD  Primary Gastroenterologist:  Roetta Sessions, MD   Chief Complaint  Patient presents with  . Dysphagia    occ with some foods    HPI: Renee Benitez is a 68 y.o. female, retired Engineer, civil (consulting), here at the request of Dr. Sudie Bailey for further evaluation of dysphagia.  She has a history of A. fib, chronically on Eliquis.  We last saw her in 2014 at time of colonoscopy.  She had diverticulosis and hemorrhoids.  Next colonoscopy in 10 years.  Maternal grandmother died with colon cancer.  She has a long history of requiring water with meals in order to "wash her food down".  Over the past several months she has had gradual worsening of symptoms.  Certain foods will become lodged and she will either vomit or has to sit and wait until it gradually goes down.  Recent episode occurred with rice and chicken.  She drinks at least 3 glasses of water with meals in order to wash her food down.  She denies any heartburn.  She does have some indigestion/bloating with certain foods.  She would generally take Gas-X as a preventative.  Denies abdominal pain.  Bowel movements are regular.  No blood in the stool or melena.  No unintentional weight loss.    Current Outpatient Medications  Medication Sig Dispense Refill  . apixaban (ELIQUIS) 5 MG TABS tablet Take 1 tablet (5 mg total) by mouth 2 (two) times daily. 60 tablet 11  . calcium carbonate (OS-CAL) 600 MG TABS Take 600 mg by mouth daily.      Marland Kitchen diltiazem (CARDIZEM CD) 180 MG 24 hr capsule Take 1 capsule (180 mg total) by mouth daily. 120 capsule 3  . diltiazem (CARDIZEM) 30 MG tablet Take 1 tablet (30 mg total) by mouth every 6 (six) hours as needed. Take 1 to 2 tablets every 6 hrs. as needed for palpitations 30 tablet 6  . flecainide (TAMBOCOR) 100 MG tablet Take 1 tablet (100 mg total) by mouth 2 (two) times daily. 180 tablet 3  . Magnesium 250 MG TABS Take 1 tablet by mouth at bedtime.     . Multiple  Vitamins-Minerals (MULTIVITAMIN WITH MINERALS) tablet Take 1 tablet by mouth daily.      . Multiple Vitamins-Minerals (PRESERVISION AREDS 2) CAPS Take 1 capsule by mouth 2 (two) times daily.    . Omega-3 Fatty Acids (FISH OIL PO) Take 1 capsule by mouth daily.      No current facility-administered medications for this visit.     Allergies as of 06/06/2018  . (No Known Allergies)   Past Medical History:  Diagnosis Date  . Atrial fibrillation (HCC)   . Current use of estrogen therapy 01/31/2013  . Dysrhythmia    AFib  . Hypertension   . Macular degeneration   . Menopausal syndrome   . Right ovarian cyst 01/25/2014   Past Surgical History:  Procedure Laterality Date  . ABDOMINAL HYSTERECTOMY    . APPENDECTOMY    . CARDIOVERSION N/A 09/14/2016   Procedure: CARDIOVERSION;  Surgeon: [name], MD;  Location: [facility];  Service: Cardiovascular;  Laterality: N/A;  . CATARACT EXTRACTION W/ INTRAOCULAR LENS  IMPLANT, BILATERAL Bilateral   . COLONOSCOPY  12/04/03   Friable anal canal otherwise normal rectum and colon  . COLONOSCOPY N/A 03/19/2013   Dr. [name]: diverticulosis, hemorrhoids. next TCS 10 years  . TEE WITHOUT CARDIOVERSION N/A 09/14/2016   Procedure: TRANSESOPHAGEAL ECHOCARDIOGRAM (TEE)  WITH PROPOFOL;  Surgeon: [name], MD;  Location: [facility];  Service: Cardiovascular;  Laterality: N/A;  . TONSILECTOMY, ADENOIDECTOMY, BILATERAL MYRINGOTOMY AND TUBES     Family History  Problem Relation Age of Onset  . Coronary artery disease Father   . Cancer Father        bladder cancer  . Arrhythmia Sister        Reportedly had ablation of SVT  . Hypertension Sister   . Colon cancer Maternal Grandmother   . Hypertension Son    Social History   Tobacco Use  . Smoking status: Never Smoker  . Smokeless tobacco: Never Used  . Tobacco comment: Never smoked  Substance Use Topics  . Alcohol use: Yes    Alcohol/week: 0.0 standard drinks    Frequency: Never    Comment: occ  wine  . Drug use: No      ROS:  General: Negative for anorexia, weight loss, fever, chills, fatigue, weakness. Eyes: Negative for vision changes.  ENT: Negative for hoarseness, nasal congestion. See hpi CV: Negative for chest pain, angina, palpitations, dyspnea on exertion, peripheral edema.  Respiratory: Negative for dyspnea at rest, dyspnea on exertion, cough, sputum, wheezing.  GI: See history of present illness. GU:  Negative for dysuria, hematuria, urinary incontinence, urinary frequency, nocturnal urination.  MS: Negative for joint pain, low back pain.  Derm: Negative for rash or itching.  Neuro: Negative for weakness, abnormal sensation, seizure, frequent headaches, memory loss, confusion.  Psych: Negative for anxiety, depression, suicidal ideation, hallucinations.  Endo: Negative for unusual weight change.  Heme: Negative for bruising or bleeding. Allergy: Negative for rash or hives.   Physical Examination:   BP (!) 149/82   Pulse 67   Temp 98.5 F (36.9 C) (Oral)   Ht 5' 6.5" (1.689 m)   Wt 190 lb 9.6 oz (86.5 kg)   BMI 30.30 kg/m     Physical Examination:   General: Well-nourished, well-developed in no acute distress.  Head: Normocephalic, atraumatic.   Eyes: Conjunctiva pink, no icterus. Mouth: Oropharyngeal mucosa moist and pink , no lesions erythema or exudate. Neck: Supple without thyromegaly, masses, or lymphadenopathy.  Lungs: Clear to auscultation bilaterally.  Heart: Regular rate and rhythm, no murmurs rubs or gallops.  Abdomen: Bowel sounds are normal, nontender, nondistended, no hepatosplenomegaly or masses, no abdominal bruits or    hernia , no rebound or guarding.   Extremities: No lower extremity edema.  Neuro: Alert and oriented x 4 , grossly normal neurologically.  Skin: Warm and dry, no rash or jaundice.   Psych: Alert and cooperative, normal mood and affect.   Imaging Studies: No results found.  Lab Results  Component Value Date    CREATININE 0.82 09/10/2016   BUN 16 09/10/2016   NA 139 09/10/2016   K 4.2 09/10/2016   CL 105 09/10/2016   CO2 26 09/10/2016   No results found for: ALT, AST, GGT, ALKPHOS, BILITOT Lab Results  Component Value Date   WBC 8.1 09/10/2016   HGB 14.9 09/10/2016   HCT 44.6 09/10/2016   MCV 90.8 09/10/2016   PLT 246 09/10/2016

## 2018-06-06 NOTE — Patient Instructions (Signed)
1. Upper endoscopy as scheduled.  Please see separate instructions. 2. Until your upper endoscopy please avoid raw vegetables, bread, rice.  Stick to soft finely chopped meats.

## 2018-06-07 NOTE — Assessment & Plan Note (Signed)
Very pleasant 68 year old female on chronic anticoagulation for history of A. fib, presenting for progressive solid food dysphagia.  Describes food impactions at times.  Has been able to relieve with vomiting.  Recommend EGD with esophageal dilation in the near future.  Hold Eliquis for 48 hours prior to procedure.  Previously did well with conscious sedation.  I have discussed the risks, alternatives, benefits with regards to but not limited to the risk of reaction to medication, bleeding, infection, perforation and the patient is agreeable to proceed. Written consent to be obtained.  In the interim, she should stick to soft foods, finely chopped moist meats.

## 2018-06-07 NOTE — Progress Notes (Signed)
CC'D TO PCP °

## 2018-06-26 ENCOUNTER — Telehealth: Payer: Self-pay | Admitting: *Deleted

## 2018-06-26 NOTE — Telephone Encounter (Signed)
Patient procedure r/s'd to 5/5 at 2:45pm. New instructions mailed

## 2018-07-26 ENCOUNTER — Other Ambulatory Visit: Payer: Self-pay | Admitting: Internal Medicine

## 2018-08-09 ENCOUNTER — Telehealth: Payer: Self-pay | Admitting: Internal Medicine

## 2018-08-09 NOTE — Telephone Encounter (Signed)
Called spoke with patient and she wants to r/s procedure scheduled for 5/5. I offered dates in June available but unable to those dates/times. Patient scheduled for 7/7 at 1:00pm. New instructions mailed. Endo is aware.

## 2018-08-09 NOTE — Telephone Encounter (Signed)
Pt wants to reschedule her EGD with RMR on 5/5 to sometime in June. Please call [phone]

## 2018-08-23 ENCOUNTER — Other Ambulatory Visit: Payer: Self-pay | Admitting: Internal Medicine

## 2018-08-23 NOTE — Telephone Encounter (Signed)
Pt last saw Dr Ladona Ridgel 08/11/17, pt is overdue for follow-up.  Last labs 09/10/16 Creat 0.82, pt is WAY overdue for labwork, age 68, weight 86.5kg.  Pt needs labwork and OV scheduled at [facility] office for rx refill.  Will forward to Vashti Hey, RN to get pt scheduled in office for labwork and OV to refill rx, as I am unsure who to send this too in the [place] office to handle.  Will also forward to [facility] triage to make aware pt needs appt for OV and CBC/CMP.

## 2018-08-23 NOTE — Telephone Encounter (Signed)
Called pt. No answer.  LMOM for pt to call back to schedule her past due appt with Dr Ladona Ridgel.  She also needs to have a BMP and CBC done for Eliquis management.  Eliquis refilled x 1 month only.

## 2018-08-28 ENCOUNTER — Telehealth: Payer: Self-pay | Admitting: Internal Medicine

## 2018-08-28 NOTE — Telephone Encounter (Signed)
Patient called in regards to having lab work done  She has scheduled appointment with Dr. Ladona Ridgel.

## 2018-08-28 NOTE — Telephone Encounter (Signed)
Patient on Flecainide, do you want labs prior top visit ?

## 2018-09-01 MED ORDER — APIXABAN 5 MG PO TABS: ORAL_TABLET | ORAL | 3 refills | Status: DC

## 2018-09-01 NOTE — Telephone Encounter (Signed)
Patient will discuss at visit with Dr.Taylor

## 2018-10-09 ENCOUNTER — Telehealth: Payer: Self-pay | Admitting: *Deleted

## 2018-10-09 NOTE — Telephone Encounter (Signed)
Pt is scheduled for her COVID 19 screening on 10/12/2018.  Pt is aware to remain in quarantine once testing is done.  Pt voiced understanding.   

## 2018-10-12 ENCOUNTER — Other Ambulatory Visit (HOSPITAL_COMMUNITY)
Admission: RE | Admit: 2018-10-12 | Discharge: 2018-10-12 | Disposition: A | Discharge status: Home / Self Care | Payer: Medicare Other | Source: Ambulatory Visit | Attending: Internal Medicine | Admitting: Internal Medicine

## 2018-10-12 DIAGNOSIS — Z1159 Encounter for screening for other viral diseases: Secondary | ICD-10-CM | POA: Insufficient documentation

## 2018-10-12 DIAGNOSIS — Z01812 Encounter for preprocedural laboratory examination: Secondary | ICD-10-CM | POA: Insufficient documentation

## 2018-10-12 LAB — SARS CORONAVIRUS 2 (TAT 6-24 HRS): SARS Coronavirus 2: NEGATIVE

## 2018-10-17 ENCOUNTER — Encounter (HOSPITAL_COMMUNITY)
Admission: RE | Disposition: A | Discharge status: Home / Self Care | Payer: Self-pay | Source: Home / Self Care | Attending: Internal Medicine

## 2018-10-17 ENCOUNTER — Encounter (HOSPITAL_COMMUNITY): Payer: Self-pay | Admitting: *Deleted

## 2018-10-17 ENCOUNTER — Other Ambulatory Visit: Payer: Self-pay

## 2018-10-17 ENCOUNTER — Ambulatory Visit (HOSPITAL_COMMUNITY)
Admission: RE | Admit: 2018-10-17 | Discharge: 2018-10-17 | Disposition: A | Discharge status: Home / Self Care | Payer: Medicare Other | Attending: Internal Medicine | Admitting: Internal Medicine

## 2018-10-17 DIAGNOSIS — Z79899 Other long term (current) drug therapy: Secondary | ICD-10-CM | POA: Insufficient documentation

## 2018-10-17 DIAGNOSIS — I4891 Unspecified atrial fibrillation: Secondary | ICD-10-CM | POA: Insufficient documentation

## 2018-10-17 DIAGNOSIS — R1314 Dysphagia, pharyngoesophageal phase: Secondary | ICD-10-CM | POA: Diagnosis not present

## 2018-10-17 DIAGNOSIS — R1319 Other dysphagia: Secondary | ICD-10-CM

## 2018-10-17 DIAGNOSIS — K449 Diaphragmatic hernia without obstruction or gangrene: Secondary | ICD-10-CM | POA: Diagnosis not present

## 2018-10-17 DIAGNOSIS — K210 Gastro-esophageal reflux disease with esophagitis: Secondary | ICD-10-CM | POA: Insufficient documentation

## 2018-10-17 DIAGNOSIS — I10 Essential (primary) hypertension: Secondary | ICD-10-CM | POA: Diagnosis not present

## 2018-10-17 DIAGNOSIS — Z7901 Long term (current) use of anticoagulants: Secondary | ICD-10-CM | POA: Diagnosis not present

## 2018-10-17 DIAGNOSIS — Z8249 Family history of ischemic heart disease and other diseases of the circulatory system: Secondary | ICD-10-CM | POA: Insufficient documentation

## 2018-10-17 DIAGNOSIS — K209 Esophagitis, unspecified: Secondary | ICD-10-CM | POA: Diagnosis not present

## 2018-10-17 DIAGNOSIS — R1310 Dysphagia, unspecified: Secondary | ICD-10-CM

## 2018-10-17 HISTORY — PX: ESOPHAGOGASTRODUODENOSCOPY: SHX5428

## 2018-10-17 HISTORY — PX: MALONEY DILATION: SHX5535

## 2018-10-17 SURGERY — EGD (ESOPHAGOGASTRODUODENOSCOPY)
Anesthesia: Moderate Sedation

## 2018-10-17 MED ORDER — SODIUM CHLORIDE 0.9 % IV SOLN
INTRAVENOUS | Status: DC
  Administered 2018-10-17: 12:00:00 via INTRAVENOUS

## 2018-10-17 MED ORDER — LIDOCAINE VISCOUS HCL 2 % MT SOLN
OROMUCOSAL | Status: AC
  Filled 2018-10-17: qty 15

## 2018-10-17 MED ORDER — MIDAZOLAM HCL 5 MG/5ML IJ SOLN
INTRAMUSCULAR | Status: AC
  Filled 2018-10-17: qty 10

## 2018-10-17 MED ORDER — LIDOCAINE VISCOUS HCL 2 % MT SOLN
OROMUCOSAL | Status: DC | PRN
  Administered 2018-10-17: 4 mL via OROMUCOSAL

## 2018-10-17 MED ORDER — STERILE WATER FOR IRRIGATION IR SOLN
Status: DC | PRN
  Administered 2018-10-17: 15 mL

## 2018-10-17 MED ORDER — ONDANSETRON HCL 4 MG/2ML IJ SOLN
INTRAMUSCULAR | Status: AC
  Filled 2018-10-17: qty 2

## 2018-10-17 MED ORDER — MEPERIDINE HCL 50 MG/ML IJ SOLN
INTRAMUSCULAR | Status: AC
  Filled 2018-10-17: qty 1

## 2018-10-17 MED ORDER — MEPERIDINE HCL 100 MG/ML IJ SOLN
INTRAMUSCULAR | Status: DC | PRN
  Administered 2018-10-17: 15 mg via INTRAVENOUS
  Administered 2018-10-17: 25 mg via INTRAVENOUS
  Administered 2018-10-17: 10 mg via INTRAVENOUS

## 2018-10-17 MED ORDER — MIDAZOLAM HCL 5 MG/5ML IJ SOLN
INTRAMUSCULAR | Status: DC | PRN
  Administered 2018-10-17: 1 mg via INTRAVENOUS
  Administered 2018-10-17 (×2): 2 mg via INTRAVENOUS
  Administered 2018-10-17: 1 mg via INTRAVENOUS

## 2018-10-17 MED ORDER — ONDANSETRON HCL 4 MG/2ML IJ SOLN
INTRAMUSCULAR | Status: DC | PRN
  Administered 2018-10-17: 4 mg via INTRAVENOUS

## 2018-10-17 NOTE — Discharge Instructions (Signed)

## 2018-10-17 NOTE — H&P (Signed)
@LOGO @   Primary Care Physician:  Gareth Morgan, MD Primary Gastroenterologist:  Dr. [name]  Pre-Procedure History & Physical: HPI:  Renee Benitez is a 68 y.o. female here for further evaluation and management of progressive esophageal dysphagia via EGD and possible ED as feasible/appropriate per plan.  Eliquis held x3 days now  Past Medical History:  Diagnosis Date  . Atrial fibrillation (HCC)   . Current use of estrogen therapy 01/31/2013  . Dysrhythmia    AFib  . Hypertension   . Macular degeneration   . Menopausal syndrome   . Right ovarian cyst 01/25/2014    Past Surgical History:  Procedure Laterality Date  . ABDOMINAL HYSTERECTOMY    . APPENDECTOMY    . CARDIOVERSION N/A 09/14/2016   Procedure: CARDIOVERSION;  Surgeon: [name], MD;  Location: [facility];  Service: Cardiovascular;  Laterality: N/A;  . CATARACT EXTRACTION W/ INTRAOCULAR LENS  IMPLANT, BILATERAL Bilateral   . COLONOSCOPY  12/04/03   Friable anal canal otherwise normal rectum and colon  . COLONOSCOPY N/A 03/19/2013   Dr. [name]: diverticulosis, hemorrhoids. next TCS 10 years  . TEE WITHOUT CARDIOVERSION N/A 09/14/2016   Procedure: TRANSESOPHAGEAL ECHOCARDIOGRAM (TEE) WITH PROPOFOL;  Surgeon: [name], MD;  Location: [facility];  Service: Cardiovascular;  Laterality: N/A;  . TONSILECTOMY, ADENOIDECTOMY, BILATERAL MYRINGOTOMY AND TUBES      Prior to Admission medications   Medication Sig Start Date End Date Taking? Authorizing Provider  apixaban (ELIQUIS) 5 MG TABS tablet TAKE ONE TABLET (5MG  TOTAL) BY MOUTH TWOTIMES DAILY 09/01/18  Yes [name], MD  Calcium Carb-Cholecalciferol (CALCIUM 600 + D PO) Take 1 tablet by mouth at bedtime.   Yes [provider]  diltiazem (CARDIZEM CD) 180 MG 24 hr capsule TAKE 1 CAPSULE BY MOUTH  DAILY 07/27/18  Yes [name], MD  diltiazem (CARDIZEM) 30 MG tablet Take 1 tablet (30 mg total) by mouth every 6 (six) hours as needed. Take 1 to 2 tablets  every 6 hrs. as needed for palpitations Patient taking differently: Take 30-60 mg by mouth every 6 (six) hours as needed (palpitations).  06/23/16  Yes [name], MD  flecainide (TAMBOCOR) 100 MG tablet TAKE 1 TABLET BY MOUTH TWO  TIMES DAILY 07/27/18  Yes [name], MD  loratadine (CLARITIN) 10 MG tablet Take 10 mg by mouth daily as needed for allergies.   Yes [provider]  Magnesium 250 MG TABS Take 250 mg by mouth at bedtime.    Yes [provider]  Multiple Vitamins-Minerals (MULTIVITAMIN WITH MINERALS) tablet Take 1 tablet by mouth daily.     Yes [provider]  Multiple Vitamins-Minerals (PRESERVISION AREDS 2) CAPS Take 1 capsule by mouth 2 (two) times daily.   Yes [provider]  Omega-3 Fatty Acids (FISH OIL ULTRA) 1400 MG CAPS Take 1,400 mg by mouth daily.   Yes [provider]  Simethicone (GAS-X PO) Take 1-2 tablets by mouth daily as needed (gas).   Yes [provider]    Allergies as of 06/06/2018  . (No Known Allergies)    Family History  Problem Relation Age of Onset  . Coronary artery disease Father   . Cancer Father        bladder cancer  . Arrhythmia Sister        Reportedly had ablation of SVT  . Hypertension Sister   . Colon cancer Maternal Grandmother   . Hypertension Son     Social  History   Socioeconomic History  . Marital status: Married    Spouse name: Not on file  . Number of children: Not on file  . Years of education: Not on file  . Highest education level: Not on file  Occupational History  . Occupation: Nurse    Comment: Personal assistant  Social Needs  . Financial resource strain: Not on file  . Food insecurity    Worry: Not on file    Inability: Not on file  . Transportation needs    Medical: Not on file    Non-medical: Not on file  Tobacco Use  . Smoking status: Never Smoker  . Smokeless tobacco: Never Used  . Tobacco comment: Never smoked  Substance and Sexual  Activity  . Alcohol use: Yes    Alcohol/week: 0.0 standard drinks    Frequency: Never    Comment: occ wine  . Drug use: No  . Sexual activity: Yes    Birth control/protection: Surgical    Comment: hyst  Lifestyle  . Physical activity    Days per week: Not on file    Minutes per session: Not on file  . Stress: Not on file  Relationships  . Social Herbalist on phone: Not on file    Gets together: Not on file    Attends religious service: Not on file    Active member of club or organization: Not on file    Attends meetings of clubs or organizations: Not on file    Relationship status: Not on file  . Intimate partner violence    Fear of current or ex partner: Not on file    Emotionally abused: Not on file    Physically abused: Not on file    Forced sexual activity: Not on file  Other Topics Concern  . Not on file  Social History Narrative  . Not on file    Review of Systems: See HPI, otherwise negative ROS  Physical Exam: There were no vitals taken for this visit. General:   Alert,  , pleasant and cooperative in NAD Neck:  Supple; no masses or thyromegaly. No significant cervical adenopathy. Lungs:  Clear throughout to auscultation.   No wheezes, crackles, or rhonchi. No acute distress. Heart:  Regular rate and rhythm; no murmurs, clicks, rubs,  or gallops. Abdomen: Non-distended, normal bowel sounds.  Soft and nontender without appreciable mass or hepatosplenomegaly.  Pulses:  Normal pulses noted. Extremities:  Without clubbing or edema.  Impression/Plan: 68 year old lady with insidiously progressive esophageal dysphagia.  Not much in the way of GERD symptoms.  Here for further evaluation per plan. The risks, benefits, limitations, alternatives and imponderables have been reviewed with the patient. Potential for esophageal dilation, biopsy, etc. have also been reviewed.  Questions have been answered. All parties agreeable.    Notice: This dictation was  prepared with Dragon dictation along with smaller phrase technology. Any transcriptional errors that result from this process are unintentional and may not be corrected upon review.

## 2018-10-17 NOTE — Op Note (Signed)
[facility] Patient Name: Luz LexSusan Veronica Procedure Date: 10/17/2018 11:54 AM MRN: [id number] Date of Birth: 1951-01-05 Attending MD: Gennette Pacobert Michael Zalma Channing , MD CSN: [id number] Age: [age] Admit Type: Outpatient Procedure:                Upper GI endoscopy Indications:              Dysphagia Providers:                Gennette Pacobert Michael Chamia Schmutz, MD, Criselda PeachesLurae B. Mathis FareAlbert Charity fundraiserN, RN,                            Burke Keelsrisann Tilley, Technician Referring MD:              Medicines:                Midazolam 6 mg IV, Meperidine 50 mg IV, Ondansetron                            4 mg IV Complications:             Estimated Blood Loss:     Estimated blood loss was minimal. Procedure:                Pre-Anesthesia Assessment:                           - Prior to the procedure, a History and Physical                            was performed, and patient medications and                            allergies were reviewed. The patient's tolerance of                            previous anesthesia was also reviewed. The risks                            and benefits of the procedure and the sedation                            options and risks were discussed with the patient.                            All questions were answered, and informed consent                            was obtained. Prior Anticoagulants: The patient                            last took Eliquis (apixaban) 3 days prior to the                            procedure. ASA Grade Assessment: III - A patient  with severe systemic disease. After reviewing the                            risks and benefits, the patient was deemed in                            satisfactory condition to undergo the procedure.                           After obtaining informed consent, the endoscope was                            passed under direct vision. Throughout the                            procedure, the patient's blood pressure, pulse, and                       oxygen saturations were monitored continuously. The                            [id number]-[id number] ([id number] scope was introduced through the                            mouth, and advanced to the second part of duodenum.                            The patient tolerated the procedure well. Scope In: 12:22:50 PM Scope Out: 12:30:43 PM Total Procedure Duration: 0 hours 7 minutes 53 seconds  Findings:      Esophagitis was found. Circumferential distal esophageal erosions within       5 mm the GE junction. Also 2 longitudinal erosions good 2 cm up in the       distal esophagus. No Barrett's epithelium seen. Tubular esophagus       appeared patent throughout its course. The scope was withdrawn. Dilation       was performed with a Maloney dilator with mild resistance at 54 Fr. The       dilation site was examined following endoscope reinsertion and showed       mild mucosal disruption. Estimated blood loss was minimal.      A small hiatal hernia was present.      The exam was otherwise without abnormality.      The duodenal bulb and second portion of the duodenum were normal. Impression:               -Moderately severe erosive reflux esophagitis.                            Dilated.                           - Small hiatal hernia.                           - The examination was otherwise normal.                           -  Normal duodenal bulb and second portion of the                            duodenum.                           - No specimens collected. Moderate Sedation:      Moderate (conscious) sedation was personally administered by an       anesthesia professional. The following parameters were monitored: oxygen       saturation, heart rate, blood pressure, respiratory rate, EKG, adequacy       of pulmonary ventilation, and response to care. Total physician       intraservice time was 15 minutes. Recommendation:           - Patient has a contact number available for                             emergencies. The signs and symptoms of potential                            delayed complications were discussed with the                            patient. Return to normal activities tomorrow.                            Written discharge instructions were provided to the                            patient.                           - Advance diet as tolerated. Begin Protonix 40 mg                            daily.                           - Continue present medications. Office visit with                            [place] in 3 months. Procedure Code(s):        --- Professional ---                           [phone], Esophagogastroduodenoscopy, flexible,                            transoral; diagnostic, including collection of                            specimen(s) by brushing or washing, when performed                            (separate procedure)  43450, Dilation of esophagus, by unguided sound or                            bougie, single or multiple passes Diagnosis Code(s):        --- Professional ---                           K20.9, Esophagitis, unspecified                           K44.9, Diaphragmatic hernia without obstruction or                            gangrene                           R13.10, Dysphagia, unspecified CPT copyright 2019 American Medical Association. All rights reserved. The codes documented in this report are preliminary and upon coder review may  be revised to meet current compliance requirements. Cristopher Estimable. Jammi Morrissette, MD Norvel Richards, MD 10/17/2018 12:52:16 PM This report has been signed electronically. Number of Addenda: 0

## 2018-10-20 ENCOUNTER — Encounter (HOSPITAL_COMMUNITY): Payer: Self-pay | Admitting: Internal Medicine

## 2018-11-07 ENCOUNTER — Encounter (HOSPITAL_COMMUNITY): Payer: Self-pay

## 2018-11-07 ENCOUNTER — Emergency Department (HOSPITAL_COMMUNITY)
Admission: EM | Admit: 2018-11-07 | Discharge: 2018-11-07 | Disposition: A | Discharge status: Home / Self Care | Payer: Medicare Other | Attending: Emergency Medicine | Admitting: Emergency Medicine

## 2018-11-07 ENCOUNTER — Emergency Department (HOSPITAL_COMMUNITY): Payer: Medicare Other

## 2018-11-07 ENCOUNTER — Other Ambulatory Visit: Payer: Self-pay

## 2018-11-07 DIAGNOSIS — R05 Cough: Secondary | ICD-10-CM | POA: Diagnosis not present

## 2018-11-07 DIAGNOSIS — R509 Fever, unspecified: Secondary | ICD-10-CM | POA: Diagnosis not present

## 2018-11-07 DIAGNOSIS — R51 Headache: Secondary | ICD-10-CM | POA: Diagnosis present

## 2018-11-07 DIAGNOSIS — Z79899 Other long term (current) drug therapy: Secondary | ICD-10-CM | POA: Insufficient documentation

## 2018-11-07 DIAGNOSIS — R5381 Other malaise: Secondary | ICD-10-CM | POA: Insufficient documentation

## 2018-11-07 DIAGNOSIS — Z20828 Contact with and (suspected) exposure to other viral communicable diseases: Secondary | ICD-10-CM | POA: Diagnosis not present

## 2018-11-07 DIAGNOSIS — B349 Viral infection, unspecified: Secondary | ICD-10-CM | POA: Diagnosis not present

## 2018-11-07 DIAGNOSIS — Z7901 Long term (current) use of anticoagulants: Secondary | ICD-10-CM | POA: Diagnosis not present

## 2018-11-07 DIAGNOSIS — I10 Essential (primary) hypertension: Secondary | ICD-10-CM | POA: Diagnosis not present

## 2018-11-07 LAB — CBC WITH DIFFERENTIAL/PLATELET
Abs Immature Granulocytes: 0.06 10*3/uL (ref 0.00–0.07)
Basophils Absolute: 0.1 10*3/uL (ref 0.0–0.1)
Basophils Relative: 1 %
Eosinophils Absolute: 0.1 10*3/uL (ref 0.0–0.5)
Eosinophils Relative: 1 %
HCT: 43.3 % (ref 36.0–46.0)
Hemoglobin: 13.9 g/dL (ref 12.0–15.0)
Immature Granulocytes: 1 %
Lymphocytes Relative: 18 %
Lymphs Abs: 2.4 10*3/uL (ref 0.7–4.0)
MCH: 29.5 pg (ref 26.0–34.0)
MCHC: 32.1 g/dL (ref 30.0–36.0)
MCV: 91.9 fL (ref 80.0–100.0)
Monocytes Absolute: 1.3 10*3/uL — ABNORMAL HIGH (ref 0.1–1.0)
Monocytes Relative: 10 %
Neutro Abs: 9.3 10*3/uL — ABNORMAL HIGH (ref 1.7–7.7)
Neutrophils Relative %: 69 %
Platelets: 284 10*3/uL (ref 150–400)
RBC: 4.71 MIL/uL (ref 3.87–5.11)
RDW: 12.3 % (ref 11.5–15.5)
WBC: 13.2 10*3/uL — ABNORMAL HIGH (ref 4.0–10.5)
nRBC: 0 % (ref 0.0–0.2)

## 2018-11-07 LAB — BASIC METABOLIC PANEL
Anion gap: 11 (ref 5–15)
BUN: 13 mg/dL (ref 8–23)
CO2: 26 mmol/L (ref 22–32)
Calcium: 9.3 mg/dL (ref 8.9–10.3)
Chloride: 101 mmol/L (ref 98–111)
Creatinine, Ser: 0.76 mg/dL (ref 0.44–1.00)
GFR calc Af Amer: >60 mL/min (ref >60)
GFR calc non Af Amer: >60 mL/min (ref >60)
Glucose, Bld: 108 mg/dL — ABNORMAL HIGH (ref 70–99)
Potassium: 4.4 mmol/L (ref 3.5–5.1)
Sodium: 138 mmol/L (ref 135–145)

## 2018-11-07 LAB — SARS CORONAVIRUS 2 BY RT PCR (HOSPITAL ORDER, PERFORMED IN ~~LOC~~ HOSPITAL LAB): SARS Coronavirus 2: NEGATIVE

## 2018-11-07 IMAGING — CR PORTABLE CHEST - 1 VIEW
1 series · 1 of 1 positions shown · non-contrast
Comparison: [DATE]

CLINICAL DATA: Cough and fever

EXAM:
PORTABLE CHEST 1 VIEW

[portable]
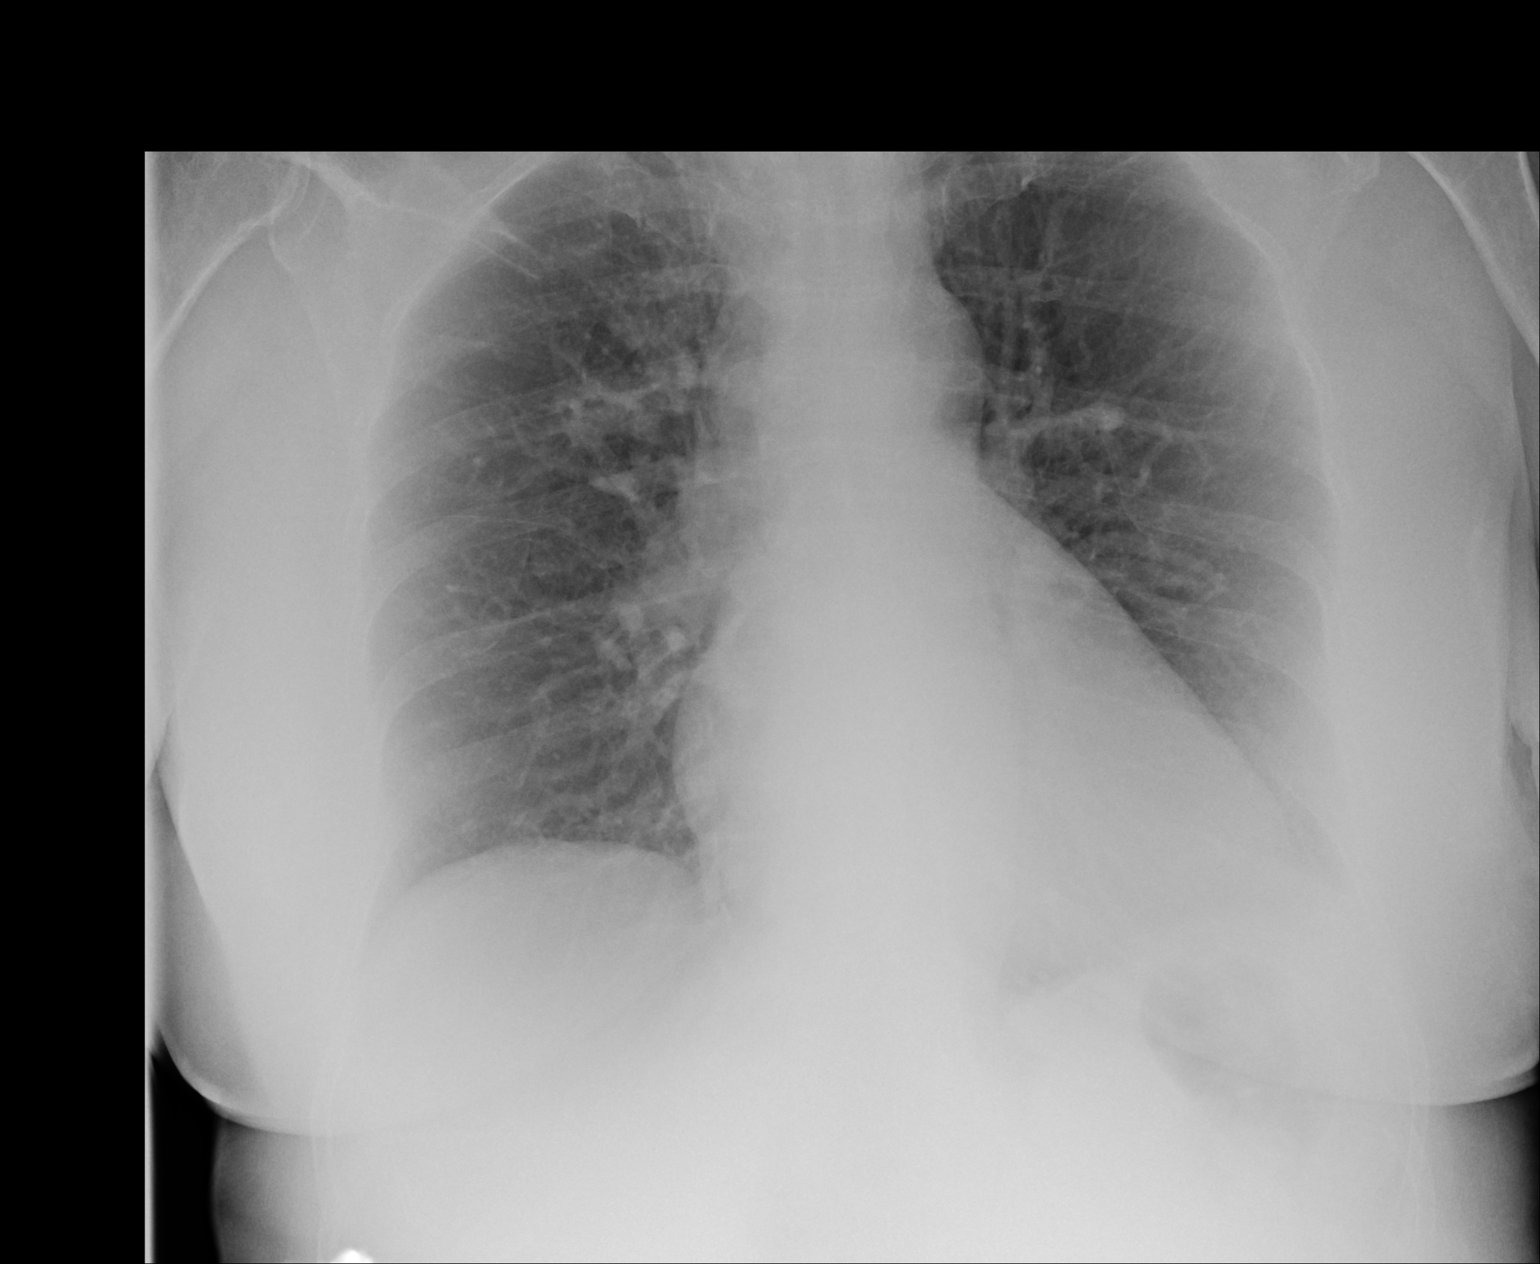

[1 of 1 positions shown; findings below may reference images not displayed]

FINDINGS: The heart size and mediastinal contours are within normal limits.
Both lungs are clear. The visualized skeletal structures are
unremarkable.
IMPRESSION: No active disease.

## 2018-11-07 NOTE — Discharge Instructions (Signed)
We signed the ER for headaches, fever, cough and malaise. Results in the ED did not indicate any acute process.  Chest x-ray is not showing a pneumonia and your COVID-19 test is negative.  We suspect that you are having viral illness. We recommend that he follow-up with your PCP in 1 week.  Headaches over the age of 48 that are persistent might need further work-up if not getting better.

## 2018-11-07 NOTE — ED Provider Notes (Signed)
[facility] EMERGENCY DEPARTMENT Provider Note   CSN: [id number] Arrival date & time: 11/07/18  [id number]    History   Chief Complaint Chief Complaint  Patient presents with   Cough    HPI Mora ApplSusan D Regis is a 68 y.o. female.     HPI 68 year old female comes in a chief complaint of headache, cough, malaise.  Patient has been feeling sick for about 5 days now.  Over the weekend she started developing a low-grade temperature along with headaches and nonproductive cough.  She denies any associated URI-like symptoms, nausea, vomiting, diarrhea.  Patient denies any known exposure to COVID-19.  Her husband works with [employer] and [employer], therefore he has been going in and of the house but patient herself has been very careful.   Patient's headaches are frontal and intermittent.  At its worst they are about 4 out of 10 and she takes Tylenol which gives her relief.  She denies any associated neck pain or stiffness.  [place] and does indicate that she has had tick bites.  No history of rash.  Past Medical History:  Diagnosis Date   Atrial fibrillation (HCC)    Current use of estrogen therapy 01/31/2013   Dysrhythmia    AFib   Hypertension    Macular degeneration    Menopausal syndrome    Right ovarian cyst 01/25/2014    Patient Active Problem List   Diagnosis Date Noted   Esophageal dysphagia 06/06/2018   Well female exam with routine gynecological exam 04/25/2017   Urinary frequency 04/25/2017   History of ovarian cyst 04/25/2017   Persistent atrial fibrillation    Right ovarian cyst 01/25/2014   Heme positive stool 02/26/2013   Current use of estrogen therapy 01/31/2013   Essential hypertension, benign 09/23/2008   Paroxysmal atrial fibrillation (HCC) 09/23/2008    Past Surgical History:  Procedure Laterality Date   ABDOMINAL HYSTERECTOMY     APPENDECTOMY     CARDIOVERSION N/A 09/14/2016   Procedure: CARDIOVERSION;  Surgeon: Jonelle SidleMcDowell, Samuel  G, MD;  Location: [facility];  Service: Cardiovascular;  Laterality: N/A;   CATARACT EXTRACTION W/ INTRAOCULAR LENS  IMPLANT, BILATERAL Bilateral    COLONOSCOPY  12/04/03   Friable anal canal otherwise normal rectum and colon   COLONOSCOPY N/A 03/19/2013   Dr. Jena Gaussourk: diverticulosis, hemorrhoids. next TCS 10 years   ESOPHAGOGASTRODUODENOSCOPY N/A 10/17/2018   Procedure: ESOPHAGOGASTRODUODENOSCOPY (EGD);  Surgeon: Corbin Adeourk, Robert M, MD;  Location: [facility] SUITE;  Service: Endoscopy;  Laterality: N/A;  3:00pm   MALONEY DILATION N/A 10/17/2018   Procedure: Elease HashimotoMALONEY DILATION;  Surgeon: Corbin Adeourk, Robert M, MD;  Location: [facility] SUITE;  Service: Endoscopy;  Laterality: N/A;   TEE WITHOUT CARDIOVERSION N/A 09/14/2016   Procedure: TRANSESOPHAGEAL ECHOCARDIOGRAM (TEE) WITH PROPOFOL;  Surgeon: Jonelle SidleMcDowell, Samuel G, MD;  Location: [facility];  Service: Cardiovascular;  Laterality: N/A;   TONSILECTOMY, ADENOIDECTOMY, BILATERAL MYRINGOTOMY AND TUBES       OB History    Gravida  2   Para  2   Term  2   Preterm      AB      Living  2     SAB      TAB      Ectopic      Multiple      Live Births  2            Home Medications    Prior to Admission medications   Medication Sig Start Date End Date  Taking? Authorizing Provider  apixaban (ELIQUIS) 5 MG TABS tablet TAKE ONE TABLET (5MG  TOTAL) BY MOUTH TWOTIMES DAILY 09/01/18  Yes Evans Lance, MD  Calcium Carb-Cholecalciferol (CALCIUM 600 + D PO) Take 1 tablet by mouth at bedtime.   Yes [provider]  diltiazem (CARDIZEM CD) 180 MG 24 hr capsule TAKE 1 CAPSULE BY MOUTH  DAILY 07/27/18  Yes Evans Lance, MD  diltiazem (CARDIZEM) 30 MG tablet Take 1 tablet (30 mg total) by mouth every 6 (six) hours as needed. Take 1 to 2 tablets every 6 hrs. as needed for palpitations Patient taking differently: Take 30-60 mg by mouth every 6 (six) hours as needed (palpitations).  06/23/16  Yes Satira Sark, MD  flecainide (TAMBOCOR) 100 MG tablet  TAKE 1 TABLET BY MOUTH TWO  TIMES DAILY 07/27/18  Yes Evans Lance, MD  loratadine (CLARITIN) 10 MG tablet Take 10 mg by mouth daily as needed for allergies.   Yes [provider]  Magnesium 250 MG TABS Take 250 mg by mouth at bedtime.    Yes [provider]  Multiple Vitamins-Minerals (MULTIVITAMIN WITH MINERALS) tablet Take 1 tablet by mouth daily.     Yes [provider]  Multiple Vitamins-Minerals (PRESERVISION AREDS 2) CAPS Take 1 capsule by mouth 2 (two) times daily.   Yes [provider]  Omega-3 Fatty Acids (FISH OIL ULTRA) 1400 MG CAPS Take 1,400 mg by mouth daily.   Yes [provider]  pantoprazole (PROTONIX) 40 MG tablet Take 40 mg by mouth daily.    Yes [provider]  Simethicone (GAS-X PO) Take 1-2 tablets by mouth daily as needed (gas).   Yes [provider]    Family History Family History  Problem Relation Age of Onset   Coronary artery disease Father    Cancer Father        bladder cancer   Arrhythmia Sister        Reportedly had ablation of SVT   Hypertension Sister    Colon cancer Maternal Grandmother    Hypertension Son     Social History Social History   Tobacco Use   Smoking status: Never Smoker   Smokeless tobacco: Never Used   Tobacco comment: Never smoked  Substance Use Topics   Alcohol use: Yes    Alcohol/week: 0.0 standard drinks    Frequency: Never    Comment: occ wine   Drug use: No     Allergies   Patient has no known allergies.   Review of Systems Review of Systems  Constitutional: Positive for activity change.  Respiratory: Positive for cough.   Musculoskeletal: Negative for neck pain and neck stiffness.  Allergic/Immunologic: Negative for immunocompromised state.  Neurological: Positive for headaches.  Hematological: Bruises/bleeds easily.  All other systems reviewed and are negative.    Physical Exam Updated Vital Signs BP (!) 146/96 (BP Location:  Left Arm)    Pulse 87    Temp 99.1 F (37.3 C) (Oral)    Resp 18    Ht 5\' 6"  (1.676 m)    Wt 83 kg    SpO2 98%    BMI 29.54 kg/m   Physical Exam Vitals signs and nursing note reviewed.  Constitutional:      Appearance: She is well-developed.  HENT:     Head: Normocephalic and atraumatic.  Eyes:     Pupils: Pupils are equal, round, and reactive to light.  Neck:     Musculoskeletal: Neck supple.  Comments: No meningismus Cardiovascular:     Rate and Rhythm: Normal rate and regular rhythm.     Heart sounds: Normal heart sounds. No murmur.  Pulmonary:     Effort: Pulmonary effort is normal. No respiratory distress.     Breath sounds: No wheezing or rales.  Skin:    General: Skin is warm and dry.  Neurological:     General: No focal deficit present.     Mental Status: She is alert and oriented to person, place, and time.      ED Treatments / Results  Labs (all labs ordered are listed, but only abnormal results are displayed) Labs Reviewed  BASIC METABOLIC PANEL - Abnormal; Notable for the following components:      Result Value   Glucose, Bld 108 (*)    All other components within normal limits  CBC WITH DIFFERENTIAL/PLATELET - Abnormal; Notable for the following components:   WBC 13.2 (*)    Neutro Abs 9.3 (*)    Monocytes Absolute 1.3 (*)    All other components within normal limits  SARS CORONAVIRUS 2 (HOSPITAL ORDER, PERFORMED IN [facility])  LYME DISEASE DNA BY PCR([facility])  ROCKY MTN SPOTTED FVR ABS PNL(IGG+IGM)    EKG None  Radiology Dg Chest Port 1 View  Result Date: 11/07/2018 CLINICAL DATA:  Cough and fever EXAM: PORTABLE CHEST 1 VIEW COMPARISON:  07/06/2010 FINDINGS: The heart size and mediastinal contours are within normal limits. Both lungs are clear. The visualized skeletal structures are unremarkable. IMPRESSION: No active disease. Electronically Signed   By: Marlan Palau M.D.   On: 11/07/2018 11:27    Procedures Procedures  (including critical care time)  Medications Ordered in ED Medications - No data to display   Initial Impression / Assessment and Plan / ED Course  I have reviewed the triage vital signs and the nursing notes.  Pertinent labs & imaging results that were available during my care of the patient were reviewed by me and considered in my medical decision making (see chart for details).        68 year old female comes in a chief complaint of headache, cough, low-grade fever and malaise.  She is relatively healthy besides a history of A. Fib.  She has no underlying lung disease.  She has been feeling ill for the past few days.  Headaches are intermittent and responding to Tylenol.  She has no associated neurologic symptoms, meningismus.  She denies any known sick exposure.  We will check her for COVID-19, as her PCP is concerned for it and send her to the ER for further evaluation for this disease.  Her lung exam is clear.  We will get a chest x-ray.  Besides COVID-19, tickborne illness leading to nonspecific symptoms is a possibility.  Lyme and RMSF titers have been ordered.  TAMISHIA DANTE was evaluated in Emergency Department on 11/07/2018 for the symptoms described in the history of present illness. She was evaluated in the context of the global COVID-19 pandemic, which necessitated consideration that the patient might be at risk for infection with the SARS-CoV-2 virus that causes COVID-19. Institutional protocols and algorithms that pertain to the evaluation of patients at risk for COVID-19 are in a state of rapid change based on information released by regulatory bodies including the CDC and federal and state organizations. These policies and algorithms were followed during the patient's care in the ED.   Final Clinical Impressions(s) / ED Diagnoses   Final diagnoses:  Viral illness    ED Discharge Orders    None       Derwood KaplanNanavati, Shanterria Franta, MD 11/07/18 1210

## 2018-11-07 NOTE — ED Triage Notes (Signed)
Pt reports went hiking recently and started having cough, fever, headache, and bodyaches.  Reports temp as high as 100.2  Reports took tylenol around6 am this morning.

## 2018-11-09 LAB — ROCKY MTN SPOTTED FVR ABS PNL(IGG+IGM)
RMSF IgG: NEGATIVE
RMSF IgM: 0.19 index (ref 0.00–0.89)

## 2018-11-22 ENCOUNTER — Other Ambulatory Visit: Payer: Self-pay | Admitting: Internal Medicine

## 2018-11-29 ENCOUNTER — Ambulatory Visit (INDEPENDENT_AMBULATORY_CARE_PROVIDER_SITE_OTHER): Payer: Medicare Other | Admitting: Internal Medicine

## 2018-11-29 ENCOUNTER — Encounter: Payer: Self-pay | Admitting: Internal Medicine

## 2018-11-29 ENCOUNTER — Other Ambulatory Visit: Payer: Self-pay

## 2018-11-29 VITALS — BP 154/87 | HR 76 | Temp 98.2°F | Ht 67.5 in | Wt 193.4 lb

## 2018-11-29 DIAGNOSIS — I10 Essential (primary) hypertension: Secondary | ICD-10-CM

## 2018-11-29 DIAGNOSIS — I480 Paroxysmal atrial fibrillation: Secondary | ICD-10-CM

## 2018-11-29 MED ORDER — FLECAINIDE ACETATE 100 MG PO TABS: 100.0000 mg | ORAL_TABLET | Freq: Two times a day (BID) | ORAL | 3 refills | Status: DC

## 2018-11-29 MED ORDER — DILTIAZEM HCL ER COATED BEADS 180 MG PO CP24: 180.0000 mg | ORAL_CAPSULE | Freq: Every day | ORAL | 3 refills | Status: DC

## 2018-11-29 NOTE — Progress Notes (Signed)
HPI Renee Benitez returns today for ongoing evaluation and management of paroxysmal atrial fibrillation and hypertension.  She also has obesity.  The patient had maintained sinus rhythm fairly nicely on flecainide therapy but does note that her symptoms have increased over the past couple of months.  She denies chest pain, shortness of breath, or syncope.  She has gained 15 lbs since her last visit . No Known Allergies   Current Outpatient Medications  Medication Sig Dispense Refill  . apixaban (ELIQUIS) 5 MG TABS tablet TAKE ONE TABLET (5MG  TOTAL) BY MOUTH TWOTIMES DAILY 180 tablet 3  . Calcium Carb-Cholecalciferol (CALCIUM 600 + D PO) Take 1 tablet by mouth at bedtime.    Marland Kitchen diltiazem (CARDIZEM CD) 180 MG 24 hr capsule TAKE 1 CAPSULE BY MOUTH  DAILY 120 capsule 3  . diltiazem (CARDIZEM) 30 MG tablet TAKE ONE TO TWO TABLETS EVERY SIX HOURS AS NEEDED FOR PALPITATIONS 30 tablet 6  . flecainide (TAMBOCOR) 100 MG tablet TAKE 1 TABLET BY MOUTH TWO  TIMES DAILY 240 tablet 1  . loratadine (CLARITIN) 10 MG tablet Take 10 mg by mouth daily as needed for allergies.    . Magnesium 250 MG TABS Take 250 mg by mouth at bedtime.     . Multiple Vitamins-Minerals (MULTIVITAMIN WITH MINERALS) tablet Take 1 tablet by mouth daily.      . Multiple Vitamins-Minerals (PRESERVISION AREDS 2) CAPS Take 1 capsule by mouth 2 (two) times daily.    . Omega-3 Fatty Acids (FISH OIL ULTRA) 1400 MG CAPS Take 1,400 mg by mouth daily.    . pantoprazole (PROTONIX) 40 MG tablet Take 40 mg by mouth daily.     . Simethicone (GAS-X PO) Take 1-2 tablets by mouth daily as needed (gas).     No current facility-administered medications for this visit.      Past Medical History:  Diagnosis Date  . Atrial fibrillation (HCC)   . Current use of estrogen therapy 01/31/2013  . Dysrhythmia    AFib  . Hypertension   . Macular degeneration   . Menopausal syndrome   . Right ovarian cyst 01/25/2014    ROS:   All systems  reviewed and negative except as noted in the HPI.   Past Surgical History:  Procedure Laterality Date  . ABDOMINAL HYSTERECTOMY    . APPENDECTOMY    . CARDIOVERSION N/A 09/14/2016   Procedure: CARDIOVERSION;  Surgeon: [name], MD;  Location: [facility];  Service: Cardiovascular;  Laterality: N/A;  . CATARACT EXTRACTION W/ INTRAOCULAR LENS  IMPLANT, BILATERAL Bilateral   . COLONOSCOPY  12/04/03   Friable anal canal otherwise normal rectum and colon  . COLONOSCOPY N/A 03/19/2013   Dr. [name]: diverticulosis, hemorrhoids. next TCS 10 years  . ESOPHAGOGASTRODUODENOSCOPY N/A 10/17/2018   Procedure: ESOPHAGOGASTRODUODENOSCOPY (EGD);  Surgeon: [name], MD;  [facility];  Service: Endoscopy;  Laterality: N/A;  3:00pm  . MALONEY DILATION N/A 10/17/2018   Procedure: Elease Hashimoto DILATION;  Surgeon: [name], MD;  [facility];  Service: Endoscopy;  Laterality: N/A;  . TEE WITHOUT CARDIOVERSION N/A 09/14/2016   Procedure: TRANSESOPHAGEAL ECHOCARDIOGRAM (TEE) WITH PROPOFOL;  Surgeon: [name], MD;  Location: [facility];  Service: Cardiovascular;  Laterality: N/A;  . TONSILECTOMY, ADENOIDECTOMY, BILATERAL MYRINGOTOMY AND TUBES       Family History  Problem Relation Age of Onset  . Coronary artery disease Father   . Cancer Father  bladder cancer  . Arrhythmia Sister        Reportedly had ablation of SVT  . Hypertension Sister   . Colon cancer Maternal Grandmother   . Hypertension Son      Social History   Socioeconomic History  . Marital status: Married    Spouse name: Not on file  . Number of children: Not on file  . Years of education: Not on file  . Highest education level: Not on file  Occupational History  . Occupation: Nurse    Comment: Copywriter, [employer]  Social Needs  . Financial resource strain: Not on file  . Food insecurity    Worry: Not on file    Inability: Not on file  . Transportation needs    Medical: Not on file     Non-medical: Not on file  Tobacco Use  . Smoking status: Never Smoker  . Smokeless tobacco: Never Used  . Tobacco comment: Never smoked  Substance and Sexual Activity  . Alcohol use: Yes    Alcohol/week: 0.0 standard drinks    Frequency: Never    Comment: occ wine  . Drug use: No  . Sexual activity: Yes    Birth control/protection: Surgical    Comment: hyst  Lifestyle  . Physical activity    Days per week: Not on file    Minutes per session: Not on file  . Stress: Not on file  Relationships  . Social Musicianconnections    Talks on phone: Not on file    Gets together: Not on file    Attends religious service: Not on file    Active member of club or organization: Not on file    Attends meetings of clubs or organizations: Not on file    Relationship status: Not on file  . Intimate partner violence    Fear of current or ex partner: Not on file    Emotionally abused: Not on file    Physically abused: Not on file    Forced sexual activity: Not on file  Other Topics Concern  . Not on file  Social History Narrative  . Not on file     BP (!) 154/87 (BP Location: Left Arm)   Pulse 76   Temp 98.2 F (36.8 C)   Ht 5' 7.5" (1.715 m)   Wt 193 lb 6.4 oz (87.7 kg)   SpO2 95%   BMI 29.84 kg/m   Physical Exam:  Well appearing but overweight 68 yo woman, NAD HEENT: Unremarkable Neck:  No JVD, no thyromegally Lymphatics:  No adenopathy Back:  No CVA tenderness Lungs:  Clear with no wheezes HEART:  Regular rate rhythm, no murmurs, no rubs, no clicks Abd:  soft, positive bowel sounds, no organomegally, no rebound, no guarding Ext:  2 plus pulses, no edema, no cyanosis, no clubbing Skin:  No rashes no nodules Neuro:  CN II through XII intact, motor grossly intact  Assess/Plan: 1. PAF - her symptoms have increased in frequency. We discussed catheter ablation as well as switching to dofetilide vs rate control. We will continue with the flecainide for now. 2. Obesity - she has gained  almost 50 lbs in 7 years including 15 lbs in the past year. I have strongly encouraged the patient to lose weight.  3. HTN - her pressure is up but she notes that when she is not in the MD's office it is controlled. 4. Dysphagia - she has undergone dilatation and feels like her symptoms are improved. She  is pending a repeat EGD in the coming weeks. She may stop her blood thinner if necessary for up to 3 days prior to the procedure.  Mikle Bosworth.D.

## 2018-11-29 NOTE — Patient Instructions (Signed)
Medication Instructions:  Your physician recommends that you continue on your current medications as directed. Please refer to the Current Medication list given to you today.  If you need a refill on your cardiac medications before your next appointment, please call your pharmacy.   Lab work: NONE   If you have labs (blood work) drawn today and your tests are completely normal, you will receive your results only by: . MyChart Message (if you have MyChart) OR . A paper copy in the mail If you have any lab test that is abnormal or we need to change your treatment, we will call you to review the results.  Testing/Procedures: NONE   Follow-Up: At [facility], you and your health needs are our priority.  As part of our continuing mission to provide you with exceptional heart care, we have created designated Provider Care Teams.  These Care Teams include your primary Cardiologist (physician) and Advanced Practice Providers (APPs -  Physician Assistants and Nurse Practitioners) who all work together to provide you with the care you need, when you need it. You will need a follow up appointment in 1 years.  Please call our office 2 months in advance to schedule this appointment.  You may see Gregg Taylor, MD or one of the following Advanced Practice Providers on your designated Care Team:   Amber Seiler, NP . Renee Ursuy, PA-C  Any Other Special Instructions Will Be Listed Below (If Applicable). Thank you for choosing [facility]!     

## 2018-12-06 DIAGNOSIS — H43813 Vitreous degeneration, bilateral: Secondary | ICD-10-CM | POA: Diagnosis not present

## 2018-12-06 DIAGNOSIS — H26491 Other secondary cataract, right eye: Secondary | ICD-10-CM | POA: Diagnosis not present

## 2018-12-06 DIAGNOSIS — Z961 Presence of intraocular lens: Secondary | ICD-10-CM | POA: Diagnosis not present

## 2018-12-06 DIAGNOSIS — H353131 Nonexudative age-related macular degeneration, bilateral, early dry stage: Secondary | ICD-10-CM | POA: Diagnosis not present

## 2019-01-10 ENCOUNTER — Telehealth: Payer: Self-pay | Admitting: *Deleted

## 2019-01-10 MED ORDER — NITROFURANTOIN MONOHYD MACRO 100 MG PO CAPS: 100.0000 mg | ORAL_CAPSULE | Freq: Two times a day (BID) | ORAL | 0 refills | Status: DC

## 2019-01-10 MED ORDER — PHENAZOPYRIDINE HCL 200 MG PO TABS: 200.0000 mg | ORAL_TABLET | Freq: Three times a day (TID) | ORAL | 0 refills | Status: DC | PRN

## 2019-01-10 NOTE — Telephone Encounter (Signed)
Pt complains of urinary frequency and has had some UI, if has spasm, some blood on toilet paper this morning, no fever or back pain, will rx Macrobid and Pyridium

## 2019-01-10 NOTE — Addendum Note (Signed)
Addended by: Derrek Monaco A on: 01/10/2019 12:58 PM   Modules accepted: Orders

## 2019-01-10 NOTE — Telephone Encounter (Signed)
Patient with complaints of urinary frequency, pressure and blood in her urine, thinks she has a UTI.

## 2019-01-17 ENCOUNTER — Other Ambulatory Visit: Payer: Self-pay

## 2019-01-17 ENCOUNTER — Encounter: Payer: Self-pay | Admitting: Nurse Practitioner

## 2019-01-17 ENCOUNTER — Ambulatory Visit (INDEPENDENT_AMBULATORY_CARE_PROVIDER_SITE_OTHER): Payer: Medicare Other | Admitting: Nurse Practitioner

## 2019-01-17 VITALS — BP 133/88 | HR 101 | Temp 96.8°F | Ht 66.5 in | Wt 190.0 lb

## 2019-01-17 DIAGNOSIS — R1310 Dysphagia, unspecified: Secondary | ICD-10-CM

## 2019-01-17 DIAGNOSIS — Z800 Family history of malignant neoplasm of digestive organs: Secondary | ICD-10-CM | POA: Diagnosis not present

## 2019-01-17 DIAGNOSIS — R1319 Other dysphagia: Secondary | ICD-10-CM

## 2019-01-17 MED ORDER — PANTOPRAZOLE SODIUM 40 MG PO TBEC: 40.0000 mg | DELAYED_RELEASE_TABLET | Freq: Every day | ORAL | 3 refills | Status: DC

## 2019-01-17 NOTE — Assessment & Plan Note (Signed)
Symptoms significantly improved after continue with dilation.  Only has issues if she "eats too fast".  Recommend she take her time eating, slow down, to chew her food adequately.  Avoid trigger foods that tend to cause dysphagia symptoms.  Follow-up in 6 months.

## 2019-01-17 NOTE — Patient Instructions (Signed)
Your health issues we discussed today were:   Dysphagia (swallowing difficulties): 1. Continue taking Protonix 2. Avoid trigger foods that cause worsening swallowing difficulties 3. Cut your food up into small bites, chew adequately, take your time eating 4. Let us know if you have any worsening or recurrent symptoms  Family history of colon cancer: 1. Due to your sister's recent diagnosis of colon cancer we will update your colonoscopy at this time 2. Further recommendations to follow your colonoscopy 3. We will contact Dr. Clifton James with the okay to hold Eliquis for 48 hours 4. Call us if you have any worsening symptoms such as obvious rectal bleeding, significant abdominal pain, or others.  Overall I recommend:  1. Continue your other current medications 2. Follow-up in 6 months 3. Call us if you have any questions or concerns.   Because of recent events of COVID-19 ("Coronavirus"), follow CDC recommendations:  1. Wash your hand frequently 2. Avoid touching your face 3. Stay away from people who are sick 4. If you have symptoms such as fever, cough, shortness of breath then call your healthcare provider for further guidance 5. If you are sick, STAY AT HOME unless otherwise directed by your healthcare provider. 6. Follow directions from state and national officials regarding staying safe   At [facility] we value your feedback. You may receive a survey about your visit today. Please share your experience as we strive to create trusting relationships with our patients to provide genuine, compassionate, quality care.  We appreciate your understanding and patience as we review any laboratory studies, imaging, and other diagnostic tests that are ordered as we care for you. Our office policy is 5 business days for review of these results, and any emergent or urgent results are addressed in a timely manner for your best interest. If you do not hear from our office in 1 week,  please contact us.   We also encourage the use of MyChart, which contains your medical information for your review as well. If you are not enrolled in this feature, an access code is on this after visit summary for your convenience. Thank you for allowing [place] to be involved in your care.  It was great to see you today!  I hope you have a great Fall!!

## 2019-01-17 NOTE — Assessment & Plan Note (Signed)
Since we last saw the patient her sister was diagnosed with colon cancer at age 66.  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  Found to be stage I that was treated with surgical resection.  No needed chemotherapy or radiation.  0 positive lymph nodes.  The patient's last colonoscopy was in 2014.  Primary relatives history of colorectal cancer changes her screening interval to 5 years and she is now due.  We will set her up for colonoscopy at this time.  Of note her sister is undergoing genetic testing and counseling.  Proceed with TCS with Dr. Gala Romney in near future: the risks, benefits, and alternatives have been discussed with the patient in detail. The patient states understanding and desires to proceed.  Patient is currently on Eliquis.  We will contact her cardiologist, Dr. Lovena Le, and request clearance to hold Eliquis for 48 hours prior to the procedure.  No other anticoagulants, anxiolytics, chronic pain medications, antidepressants, antidiabetics, or iron supplements.  Conscious sedation should be adequate for her procedure as it was for her last.

## 2019-01-17 NOTE — Progress Notes (Signed)
Referring Provider: Gareth Morgan, MD Primary Care Physician:  Gareth Morgan, MD Primary GI:  Dr. [name]  Chief Complaint  Patient presents with  . pp f/u  . sister diagnosed with colon cancer    wants to discuss future tcs    HPI:   Renee Benitez is a 68 y.o. female who presents for post procedure follow-up.  The patient was last seen in our office 06/06/2018 for dysphagia.  Last seen in 2014 at time of colonoscopy.  History of diverticulosis and hemorrhoids.  At her last visit noted several months of worsening dysphagia symptoms, despite adequate fluids with meals.  No GERD symptoms.  No other GI complaints.  Recommended EGD with possible dilation.  EGD completed 10/17/2018 which found moderately severe erosive esophagitis, esophagus status post dilation, small hiatal hernia, otherwise normal.  Recommended Protonix 40 mg daily, follow-up in 3 months.  Today she states she's doing well overall. Since she was last here her sister was diagnosed with colon cancer and is not sure if this changes her timeframe for repeat colonoscopy. Dysphagia is improved since GERD. Rare dysphagia with solid foods which she thinks is because she sometimes eats too fast. Denies abdominal pain, N/V, hematochezia, melena, fever, chills, unintentional weight loss. Denies URI or flu-like symptoms. Denies loss of sense of taste or smell. Denies chest pain, dyspnea, dizziness, lightheadedness, syncope, near syncope. Denies any other upper or lower GI symptoms.  States her sister was recently diagnosed with CRC, she brings in a pathology report with her today. 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 0/26 (none/zero) lymph notes positive. Sister had partial colectomy and no adjunct chemo/radiation. Her sister is undergoing genetic testing/counseling.  Is on Eliquis for AFib.  Past Medical History:  Diagnosis Date  . Atrial  fibrillation (HCC)   . Current use of estrogen therapy 01/31/2013  . Dysrhythmia    AFib  . Hypertension   . Macular degeneration   . Menopausal syndrome   . Right ovarian cyst 01/25/2014    Past Surgical History:  Procedure Laterality Date  . ABDOMINAL HYSTERECTOMY    . APPENDECTOMY    . CARDIOVERSION N/A 09/14/2016   Procedure: CARDIOVERSION;  Surgeon: [name], MD;  Location: [facility];  Service: Cardiovascular;  Laterality: N/A;  . CATARACT EXTRACTION W/ INTRAOCULAR LENS  IMPLANT, BILATERAL Bilateral   . COLONOSCOPY  12/04/03   Friable anal canal otherwise normal rectum and colon  . COLONOSCOPY N/A 03/19/2013   Dr. [name]: diverticulosis, hemorrhoids. next TCS 10 years  . ESOPHAGOGASTRODUODENOSCOPY N/A 10/17/2018   Procedure: ESOPHAGOGASTRODUODENOSCOPY (EGD);  Surgeon: [name], MD;  [facility];  Service: Endoscopy;  Laterality: N/A;  3:00pm  . MALONEY DILATION N/A 10/17/2018   Procedure: Elease Hashimoto DILATION;  Surgeon: [name], MD;  [facility];  Service: Endoscopy;  Laterality: N/A;  . TEE WITHOUT CARDIOVERSION N/A 09/14/2016   Procedure: TRANSESOPHAGEAL ECHOCARDIOGRAM (TEE) WITH PROPOFOL;  Surgeon: [name], MD;  Location: [facility];  Service: Cardiovascular;  Laterality: N/A;  . TONSILECTOMY, ADENOIDECTOMY, BILATERAL MYRINGOTOMY AND TUBES      Current Outpatient Medications  Medication Sig Dispense Refill  . apixaban (ELIQUIS) 5 MG TABS tablet TAKE ONE TABLET (5MG  TOTAL) BY MOUTH TWOTIMES DAILY 180 tablet 3  . Calcium Carb-Cholecalciferol (CALCIUM 600 + D PO) Take 1 tablet by mouth at bedtime.    diltiazem (CARDIZEM CD) 180 MG 24 hr capsule Take 1  capsule (180 mg total) by mouth daily. 90 capsule 3  . diltiazem (CARDIZEM) 30 MG tablet TAKE ONE TO TWO TABLETS EVERY SIX HOURS AS NEEDED FOR PALPITATIONS 30 tablet 6  . flecainide (TAMBOCOR) 100 MG tablet Take 1 tablet (100 mg total) by mouth 2 (two) times daily. 180 tablet 3  .  loratadine (CLARITIN) 10 MG tablet Take 10 mg by mouth daily as needed for allergies.    . Magnesium 250 MG TABS Take 250 mg by mouth at bedtime.     . Multiple Vitamins-Minerals (MULTIVITAMIN WITH MINERALS) tablet Take 1 tablet by mouth daily.      . Multiple Vitamins-Minerals (PRESERVISION AREDS 2) CAPS Take 1 capsule by mouth 2 (two) times daily.    . Omega-3 Fatty Acids (FISH OIL ULTRA) 1400 MG CAPS Take 1,400 mg by mouth daily.    . pantoprazole (PROTONIX) 40 MG tablet Take 40 mg by mouth daily.     . Simethicone (GAS-X PO) Take 1-2 tablets by mouth daily as needed (gas).     No current facility-administered medications for this visit.     Allergies as of 01/17/2019  . (No Known Allergies)    Family History  Problem Relation Age of Onset  . Coronary artery disease Father   . Cancer Father        bladder cancer  . Arrhythmia Sister        Reportedly had ablation of SVT  . Hypertension Sister   . Colon cancer Sister 53       s/p surgical resection, no chemo/radiation  . Colon cancer Maternal Grandmother   . Hypertension Son     Social History   Socioeconomic History  . Marital status: Married    Spouse name: Not on file  . Number of children: Not on file  . Years of education: Not on file  . Highest education level: Not on file  Occupational History  . Occupation: Nurse    Comment: Copywriter, advertising  Social Needs  . Financial resource strain: Not on file  . Food insecurity    Worry: Not on file    Inability: Not on file  . Transportation needs    Medical: Not on file    Non-medical: Not on file  Tobacco Use  . Smoking status: Never Smoker  . Smokeless tobacco: Never Used  . Tobacco comment: Never smoked  Substance and Sexual Activity  . Alcohol use: Not Currently    Alcohol/week: 0.0 standard drinks    Frequency: Never    Comment: occ wine  . Drug use: No  . Sexual activity: Yes    Birth control/protection: Surgical    Comment: hyst  Lifestyle  .  Physical activity    Days per week: Not on file    Minutes per session: Not on file  . Stress: Not on file  Relationships  . Social Musician on phone: Not on file    Gets together: Not on file    Attends religious service: Not on file    Active member of club or organization: Not on file    Attends meetings of clubs or organizations: Not on file    Relationship status: Not on file  Other Topics Concern  . Not on file  Social History Narrative  . Not on file    Review of Systems: General: Negative for anorexia, weight loss, fever, chills, fatigue, weakness. ENT: Negative for hoarseness, difficulty swallowing CV: Negative for chest pain, angina,  palpitations, peripheral edema.  Respiratory: Negative for dyspnea at rest, cough, sputum, wheezing.  GI: See history of present illness. Endo: Negative for unusual weight change.  Heme: Negative for bruising or bleeding. Allergy: Negative for rash or hives.   Physical Exam: BP 133/88   Pulse (!) 101   Temp (!) 96.8 F (36 C) (Temporal)   Ht 5' 6.5" (1.689 m)   Wt 190 lb (86.2 kg)   BMI 30.21 kg/m  General:   Alert and oriented. Pleasant and cooperative. Well-nourished and well-developed.  Eyes:  Without icterus, sclera clear and conjunctiva pink.  Ears:  Normal auditory acuity. Cardiovascular:  S1, S2 present without murmurs appreciated. Extremities without clubbing or edema. Respiratory:  Clear to auscultation bilaterally. No wheezes, rales, or rhonchi. No distress.  Gastrointestinal:  +BS, soft, non-tender and non-distended. No HSM noted. No guarding or rebound. No masses appreciated.  Rectal:  Deferred  Musculoskalatal:  Symmetrical without gross deformities. Neurologic:  Alert and oriented x4;  grossly normal neurologically. Psych:  Alert and cooperative. Normal mood and affect. Heme/Lymph/Immune: No excessive bruising noted.    01/17/2019 11:11 AM   Disclaimer: This note was dictated with voice recognition  software. Similar sounding words can inadvertently be transcribed and may not be corrected upon review.

## 2019-01-18 ENCOUNTER — Telehealth: Payer: Self-pay | Admitting: Internal Medicine

## 2019-01-18 ENCOUNTER — Telehealth: Payer: Self-pay

## 2019-01-18 NOTE — Telephone Encounter (Signed)
Patient says recently she is in PAF more than out and wonders if her diltiazem 180 mg qd should be increased.She has not take Diltiazem 30 mg , 1-2 tablets q 6 hours for this.She is going to take this on a schedule for the next couple days to see if this helps and will call back.

## 2019-01-18 NOTE — Telephone Encounter (Signed)
Patient left message in regards to symptoms and medication dosages. Please return call. / tg

## 2019-01-18 NOTE — Telephone Encounter (Signed)
Dr. Lovena Le, It's time for pt to have her colonoscopy with Dr. Gala Romney. Is it ok for pt to hold Eliquis x 48 hours prior to her procedure? Please advise.

## 2019-01-23 NOTE — Telephone Encounter (Signed)
Ov notes faxed to Dr. Forde Dandy office.

## 2019-01-24 ENCOUNTER — Telehealth: Payer: Self-pay | Admitting: *Deleted

## 2019-01-24 ENCOUNTER — Other Ambulatory Visit: Payer: Self-pay | Admitting: *Deleted

## 2019-01-24 MED ORDER — DILTIAZEM HCL 30 MG PO TABS: ORAL_TABLET | ORAL | 6 refills | Status: DC

## 2019-01-24 NOTE — Telephone Encounter (Signed)
   [facility] Pre-operative Risk Assessment    Request for surgical clearance:  1. What type of surgery is being performed? COLONOSCOPY   2. When is this surgery scheduled? TBD   3. What type of clearance is required (medical clearance vs. Pharmacy clearance to hold med vs. Both)? BOTH  4. Are there any medications that need to be held prior to surgery and how long? ELIQUIS X 48 HOURS PRIOR TO PROCEDURE   5. Practice name and name of physician performing surgery? [facility]; DR. Alroy Dust ROURK   6. What is your office phone number [phone]    7.   What is your office fax number [phone]  8.   Anesthesia type (None, local, MAC, general) ? NOT LISTED. PROPOFOL?    Julaine Hua 01/24/2019, 10:47 AM  _________________________________________________________________   (provider comments below)

## 2019-01-24 NOTE — Telephone Encounter (Signed)
Patient with diagnosis of afib on Eliquis for anticoagulation.    Procedure: COLONOSCOPY  Date of procedure: TBD  CHADS2-VASc score of  3 (HTN, AGE, female)  CrCl 80 ml/min  Per office protocol, patient can hold Eliquis for 2 days prior to procedure.

## 2019-01-24 NOTE — Telephone Encounter (Signed)
   Primary Cardiologist: Dr. Lovena Le  Chart reviewed as part of pre-operative protocol coverage. Patient was contacted 01/24/2019 in reference to pre-operative risk assessment for pending surgery as outlined below.  Renee Benitez was last seen on 11/29/2018 by Dr. Lovena Le.  Since that day, Renee Benitez has done well without chest pain or shortness. She is having recurrent episode of atrial fibrillation, but she has very good cardiac awareness and understand that colonoscopy will need to be pushed back if her heart rate is uncontrolled. She is taking extra short acting diltiazem in order to control her atrial fibrillation. She has very good chance to self convert prior to the colonoscopy.  Therefore, based on ACC/AHA guidelines, the patient would be at acceptable risk for the planned procedure without further cardiovascular testing.   I will route this recommendation to the requesting party via Epic fax function and remove from pre-op pool.  Please call with questions. Per our clinical pharmacist, patient can hold eliquis for 2 days prior to the procedure and restart as soon as possible after the procedure.   [place], [place] 01/24/2019, 5:05 PM

## 2019-01-31 NOTE — Telephone Encounter (Addendum)
Jonni Sanger at [facility] would like to know if we may increase tablet size to Diltiazem  60 mg because pt is taking 60 mg daily instead of the 30. Please advise

## 2019-01-31 NOTE — Telephone Encounter (Signed)
Ok to increase to 60 mg tablet daily

## 2019-02-02 MED ORDER — DILTIAZEM HCL 60 MG PO TABS: 60.0000 mg | ORAL_TABLET | Freq: Four times a day (QID) | ORAL | 6 refills | Status: DC | PRN

## 2019-02-02 NOTE — Telephone Encounter (Signed)
Order placed to pharmacy. 

## 2019-02-02 NOTE — Addendum Note (Signed)
Addended by: Levonne Hubert on: 02/02/2019 10:56 AM   Modules accepted: Orders

## 2019-02-05 ENCOUNTER — Telehealth: Payer: Self-pay | Admitting: Internal Medicine

## 2019-02-05 NOTE — Telephone Encounter (Signed)
Pt seen in office on 10/7 and was calling to schedule her colonoscopy. Please call [phone]

## 2019-02-05 NOTE — Telephone Encounter (Signed)
MB, I see a note in pt's chart mentioning the TCS. It wasn't routed to me and I haven't received a letter back. Please see if the documentation is ok to schedule pt's procedure?

## 2019-02-05 NOTE — Telephone Encounter (Addendum)
Routing to AM to f/u on holding Eliquis. Pt aware.

## 2019-02-05 NOTE — Telephone Encounter (Signed)
Randall Hiss, see telephone note 01/24/19 made by cardiology. Can TCS be scheduled?

## 2019-02-06 ENCOUNTER — Other Ambulatory Visit: Payer: Self-pay

## 2019-02-06 DIAGNOSIS — Z800 Family history of malignant neoplasm of digestive organs: Secondary | ICD-10-CM

## 2019-02-06 MED ORDER — PEG 3350-KCL-NA BICARB-NACL 420 G PO SOLR: 4000.0000 mL | ORAL | 0 refills | Status: DC

## 2019-02-06 NOTE — Telephone Encounter (Signed)
I read their note. Ok to schedule with Eliquis hold x 2 days.

## 2019-02-06 NOTE — Addendum Note (Signed)
Addended by: Hassan Rowan on: 02/06/2019 10:19 AM   Modules accepted: Orders

## 2019-02-06 NOTE — Telephone Encounter (Signed)
Called pt, TCS w/RMR scheduled for 05/02/19 at 7:30am. COVID test 04/30/19 at 10:00am (pt aware to quarantine at home after the test until procedure). Pt aware to hold Eliquis for 48 hours prior to procedure. Rx for prep sent to pharmacy. Orders entered. Appt letter mailed with procedure instructions.

## 2019-04-30 ENCOUNTER — Other Ambulatory Visit (HOSPITAL_COMMUNITY)
Admission: RE | Admit: 2019-04-30 | Discharge: 2019-04-30 | Disposition: A | Discharge status: Home / Self Care | Payer: Medicare Other | Source: Ambulatory Visit | Attending: Internal Medicine | Admitting: Internal Medicine

## 2019-04-30 ENCOUNTER — Other Ambulatory Visit: Payer: Self-pay

## 2019-04-30 DIAGNOSIS — Z01812 Encounter for preprocedural laboratory examination: Secondary | ICD-10-CM | POA: Diagnosis not present

## 2019-04-30 DIAGNOSIS — Z20822 Contact with and (suspected) exposure to covid-19: Secondary | ICD-10-CM | POA: Insufficient documentation

## 2019-04-30 LAB — SARS CORONAVIRUS 2 (TAT 6-24 HRS): SARS Coronavirus 2: NEGATIVE

## 2019-05-02 ENCOUNTER — Other Ambulatory Visit: Payer: Self-pay

## 2019-05-02 ENCOUNTER — Encounter (HOSPITAL_COMMUNITY): Payer: Self-pay | Admitting: Internal Medicine

## 2019-05-02 ENCOUNTER — Encounter (HOSPITAL_COMMUNITY)
Admission: RE | Disposition: A | Discharge status: Home / Self Care | Payer: Self-pay | Source: Home / Self Care | Attending: Internal Medicine

## 2019-05-02 ENCOUNTER — Ambulatory Visit (HOSPITAL_COMMUNITY)
Admission: RE | Admit: 2019-05-02 | Discharge: 2019-05-02 | Disposition: A | Discharge status: Home / Self Care | Payer: Medicare Other | Attending: Internal Medicine | Admitting: Internal Medicine

## 2019-05-02 DIAGNOSIS — Z8249 Family history of ischemic heart disease and other diseases of the circulatory system: Secondary | ICD-10-CM | POA: Diagnosis not present

## 2019-05-02 DIAGNOSIS — K644 Residual hemorrhoidal skin tags: Secondary | ICD-10-CM | POA: Diagnosis not present

## 2019-05-02 DIAGNOSIS — I4891 Unspecified atrial fibrillation: Secondary | ICD-10-CM | POA: Diagnosis not present

## 2019-05-02 DIAGNOSIS — Z7901 Long term (current) use of anticoagulants: Secondary | ICD-10-CM | POA: Insufficient documentation

## 2019-05-02 DIAGNOSIS — H3530 Unspecified macular degeneration: Secondary | ICD-10-CM | POA: Diagnosis not present

## 2019-05-02 DIAGNOSIS — K642 Third degree hemorrhoids: Secondary | ICD-10-CM | POA: Diagnosis not present

## 2019-05-02 DIAGNOSIS — Z1211 Encounter for screening for malignant neoplasm of colon: Secondary | ICD-10-CM | POA: Insufficient documentation

## 2019-05-02 DIAGNOSIS — Z800 Family history of malignant neoplasm of digestive organs: Secondary | ICD-10-CM

## 2019-05-02 DIAGNOSIS — I10 Essential (primary) hypertension: Secondary | ICD-10-CM | POA: Insufficient documentation

## 2019-05-02 DIAGNOSIS — Z79899 Other long term (current) drug therapy: Secondary | ICD-10-CM | POA: Insufficient documentation

## 2019-05-02 HISTORY — PX: COLONOSCOPY: SHX5424

## 2019-05-02 SURGERY — COLONOSCOPY
Anesthesia: Moderate Sedation

## 2019-05-02 MED ORDER — MIDAZOLAM HCL 5 MG/5ML IJ SOLN
INTRAMUSCULAR | Status: AC
  Filled 2019-05-02: qty 10

## 2019-05-02 MED ORDER — MEPERIDINE HCL 50 MG/ML IJ SOLN
INTRAMUSCULAR | Status: AC
  Filled 2019-05-02: qty 1

## 2019-05-02 MED ORDER — STERILE WATER FOR IRRIGATION IR SOLN
Status: DC | PRN
  Administered 2019-05-02: 1.5 mL

## 2019-05-02 MED ORDER — MEPERIDINE HCL 100 MG/ML IJ SOLN
INTRAMUSCULAR | Status: DC | PRN
  Administered 2019-05-02: 25 mg via INTRAVENOUS
  Administered 2019-05-02: 10 mg via INTRAVENOUS
  Administered 2019-05-02: 15 mg via INTRAVENOUS

## 2019-05-02 MED ORDER — ONDANSETRON HCL 4 MG/2ML IJ SOLN
INTRAMUSCULAR | Status: AC
  Filled 2019-05-02: qty 2

## 2019-05-02 MED ORDER — MIDAZOLAM HCL 5 MG/5ML IJ SOLN
INTRAMUSCULAR | Status: DC | PRN
  Administered 2019-05-02: 2 mg via INTRAVENOUS
  Administered 2019-05-02 (×2): 1 mg via INTRAVENOUS
  Administered 2019-05-02: 2 mg via INTRAVENOUS
  Administered 2019-05-02 (×3): 1 mg via INTRAVENOUS

## 2019-05-02 MED ORDER — SODIUM CHLORIDE 0.9 % IV SOLN: INTRAVENOUS | Status: DC

## 2019-05-02 MED ORDER — ONDANSETRON HCL 4 MG/2ML IJ SOLN
INTRAMUSCULAR | Status: DC | PRN
  Administered 2019-05-02: 4 mg via INTRAVENOUS

## 2019-05-02 NOTE — H&P (Signed)
@LOGO @   Primary Care Physician:  , MD Primary Gastroenterologist:  Dr. Gareth Morgan  Pre-Procedure History & Physical: HPI:  Renee Benitez is a 69 y.o. female is here for a screening colonoscopy.   Past Medical History:  Diagnosis Date  . Atrial fibrillation (HCC)   . Current use of estrogen therapy 01/31/2013  . Dysrhythmia    AFib  . Hypertension   . Macular degeneration   . Menopausal syndrome   . Right ovarian cyst 01/25/2014    Past Surgical History:  Procedure Laterality Date  . ABDOMINAL HYSTERECTOMY    . APPENDECTOMY    . CARDIOVERSION N/A 09/14/2016   Procedure: CARDIOVERSION;  Surgeon: 11/14/2016, MD;  Location: [facility];  Service: Cardiovascular;  Laterality: N/A;  . CATARACT EXTRACTION W/ INTRAOCULAR LENS  IMPLANT, BILATERAL Bilateral   . COLONOSCOPY  12/04/03   Friable anal canal otherwise normal rectum and colon  . COLONOSCOPY N/A 03/19/2013   Dr. 14/11/2012: diverticulosis, hemorrhoids. next TCS 10 years  . ESOPHAGOGASTRODUODENOSCOPY N/A 10/17/2018   Procedure: ESOPHAGOGASTRODUODENOSCOPY (EGD);  Surgeon: 12/18/2018, MD;  Location: [facility];  Service: Endoscopy;  Laterality: N/A;  3:00pm  . MALONEY DILATION N/A 10/17/2018   Procedure: 12/18/2018 DILATION;  Surgeon: Elease Hashimoto, MD;  Location: [facility];  Service: Endoscopy;  Laterality: N/A;  . TEE WITHOUT CARDIOVERSION N/A 09/14/2016   Procedure: TRANSESOPHAGEAL ECHOCARDIOGRAM (TEE) WITH PROPOFOL;  Surgeon: 11/14/2016, MD;  Location: [facility];  Service: Cardiovascular;  Laterality: N/A;  . TONSILECTOMY, ADENOIDECTOMY, BILATERAL MYRINGOTOMY AND TUBES      Prior to Admission medications   Medication Sig Start Date End Date Taking? Authorizing Provider  acetaminophen (TYLENOL) 500 MG tablet Take 1,000 mg by mouth every 6 (six) hours as needed for moderate pain or headache.   Yes [provider]  apixaban (ELIQUIS) 5 MG TABS tablet TAKE ONE TABLET (5MG  TOTAL) BY MOUTH TWOTIMES  DAILY Patient taking differently: Take 5 mg by mouth 2 (two) times daily.  09/01/18  Yes , MD  Calcium Carb-Cholecalciferol (CALCIUM 600 + D PO) Take 1 tablet by mouth daily.   Yes [provider]  Cholecalciferol (DIALYVITE VITAMIN D 5000) 125 MCG (5000 UT) capsule Take 5,000 Units by mouth daily.   Yes [provider]  diltiazem (CARDIZEM CD) 180 MG 24 hr capsule Take 1 capsule (180 mg total) by mouth daily. 11/29/18  Yes [name], MD  diltiazem (CARDIZEM) 60 MG tablet Take 1 tablet (60 mg total) by mouth every 6 (six) hours as needed. Patient taking differently: Take 60 mg by mouth every 6 (six) hours as needed (afib).  02/02/19  Yes [name], MD  flecainide (TAMBOCOR) 100 MG tablet Take 1 tablet (100 mg total) by mouth 2 (two) times daily. 11/29/18  Yes [name], MD  Magnesium 250 MG TABS Take 250 mg by mouth at bedtime.    Yes [provider]  Multiple Vitamins-Minerals (MULTIVITAMIN WITH MINERALS) tablet Take 1 tablet by mouth daily.     Yes [provider]  Multiple Vitamins-Minerals (PRESERVISION AREDS 2) CAPS Take 1 capsule by mouth 2 (two) times daily.   Yes [provider]  Omega-3 Fatty Acids (FISH OIL ULTRA) 1400 MG CAPS Take 1,400 mg by mouth daily.   Yes [provider]  pantoprazole (PROTONIX) 40 MG tablet Take 1 tablet (40 mg total) by mouth daily. 01/17/19  Yes [name], NP  polyethylene glycol-electrolytes (TRILYTE)  420 g solution Take 4,000 mLs by mouth as directed. 02/06/19  Yes Tejal Monroy, Gerrit Friends, MD  loratadine (CLARITIN) 10 MG tablet Take 10 mg by mouth daily as needed for allergies.    [provider]  Simethicone (GAS-X PO) Take 1-2 tablets by mouth daily as needed (gas).    [provider]    Allergies as of 02/06/2019  . (No Known Allergies)    Family History  Problem Relation Age of Onset  . Coronary artery disease Father   . Cancer Father        bladder  cancer  . Arrhythmia Sister        Reportedly had ablation of SVT  . Hypertension Sister   . Colon cancer Sister 7       s/p surgical resection, no chemo/radiation  . Colon cancer Maternal Grandmother   . Hypertension Son     Social History   Socioeconomic History  . Marital status: Married    Spouse name: Not on file  . Number of children: Not on file  . Years of education: Not on file  . Highest education level: Not on file  Occupational History  . Occupation: Nurse    Comment: Copywriter, advertising  Tobacco Use  . Smoking status: Never Smoker  . Smokeless tobacco: Never Used  . Tobacco comment: Never smoked  Substance and Sexual Activity  . Alcohol use: Not Currently    Alcohol/week: 0.0 standard drinks    Comment: occ wine  . Drug use: No  . Sexual activity: Yes    Birth control/protection: Surgical    Comment: hyst  Other Topics Concern  . Not on file  Social History Narrative  . Not on file   Social Determinants of Health   Financial Resource Strain:   . Difficulty of Paying Living Expenses: Not on file  Food Insecurity:   . Worried About Programme researcher, broadcasting/film/video in the Last Year: Not on file  . Ran Out of Food in the Last Year: Not on file  Transportation Needs:   . Lack of Transportation (Medical): Not on file  . Lack of Transportation (Non-Medical): Not on file  Physical Activity:   . Days of Exercise per Week: Not on file  . Minutes of Exercise per Session: Not on file  Stress:   . Feeling of Stress : Not on file  Social Connections:   . Frequency of Communication with Friends and Family: Not on file  . Frequency of Social Gatherings with Friends and Family: Not on file  . Attends Religious Services: Not on file  . Active Member of Clubs or Organizations: Not on file  . Attends Banker Meetings: Not on file  . Marital Status: Not on file  Intimate Partner Violence:   . Fear of Current or Ex-Partner: Not on file  . Emotionally Abused: Not on  file  . Physically Abused: Not on file  . Sexually Abused: Not on file    Review of Systems: See HPI, otherwise negative ROS  Physical Exam: BP (!) 164/84   Pulse 71   Temp 98.6 F (37 C) (Oral)   Resp 19   Ht 5\' 7"  (1.702 m)   Wt 86.2 kg   SpO2 100%   BMI 29.76 kg/m  General:   Alert,  Well-developed, well-nourished, pleasant and cooperative in NAD Lungs:  Clear throughout to auscultation.   No wheezes, crackles, or rhonchi. No acute distress. Heart:  Regular rate and rhythm; no  murmurs, clicks, rubs,  or gallops. Abdomen:  Soft, nontender and nondistended. No masses, hepatosplenomegaly or hernias noted. Normal bowel sounds, without guarding, and without rebound.     Impression/Plan: EARLY ORD is now here to undergo a screening colonoscopy.  Family history now with sister with colon cancer diagnosed in her early 76s.  No bowel symptoms.  Risks, benefits, limitations, imponderables and alternatives regarding colonoscopy have been reviewed with the patient. Questions have been answered. All parties agreeable.     Notice:  This dictation was prepared with Dragon dictation along with smaller phrase technology. Any transcriptional errors that result from this process are unintentional and may not be corrected upon review.

## 2019-05-02 NOTE — Op Note (Signed)
[facility] Patient Name: Renee Benitez Procedure Date: 05/02/2019 7:06 AM MRN: [id number] Date of Birth: 1950/08/18 Attending MD: Gennette Pac , MD CSN: [id number] Age: 69 Admit Type: Outpatient Procedure:                Colonoscopy Indications:              Screening for colorectal malignant neoplasm Providers:                Gennette Pac, MD, Buel Ream. Thomasena Edis RN, RN,                            Dyann Ruddle Referring MD:              Medicines:                Midazolam 9 mg IV, Meperidine 50 mg IV, Ondansetron                            4 mg IV Complications:            No immediate complications. Estimated Blood Loss:     Estimated blood loss: none. Procedure:                Pre-Anesthesia Assessment:                           - Prior to the procedure, a History and Physical                            was performed, and patient medications and                            allergies were reviewed. The patient's tolerance of                            previous anesthesia was also reviewed. The risks                            and benefits of the procedure and the sedation                            options and risks were discussed with the patient.                            All questions were answered, and informed consent                            was obtained. Prior Anticoagulants: The patient                            last took Eliquis (apixaban) 2 days prior to the                            procedure. ASA Grade Assessment: II - A patient  with mild systemic disease. After reviewing the                            risks and benefits, the patient was deemed in                            satisfactory condition to undergo the procedure.                           After obtaining informed consent, the colonoscope                            was passed under direct vision. Throughout the                            procedure, the patient's  blood pressure, pulse, and                            oxygen saturations were monitored continuously. The                            [id number]-[id number] ([id number]) scope was introduced through                            the anus and advanced to the the cecum, identified                            by appendiceal orifice and ileocecal valve. The                            colonoscopy was performed without difficulty. The                            patient tolerated the procedure well. The quality                            of the bowel preparation was adequate. Scope In: 7:54:18 AM Scope Out: 8:13:23 AM Scope Withdrawal Time: 0 hours 7 minutes 41 seconds  Total Procedure Duration: 0 hours 19 minutes 5 seconds  Findings:      The perianal and digital rectal examinations were normal.      The colon (entire examined portion) appeared normal.      The entire examined colon appeared normal on direct and retroflexion       views.      Non-bleeding external and internal hemorrhoids were found during       retroflexion. The hemorrhoids were moderate, medium-sized and Grade III       (internal hemorrhoids that prolapse but require manual reduction). Impression:               - The entire examined colon is normal.                           - The entire examined colon is normal on direct and  retroflexion views.                           - Non-bleeding external and internal hemorrhoids.                           - No specimens collected. Moderate Sedation:      Moderate (conscious) sedation was administered by the endoscopy nurse       and supervised by the endoscopist. The following parameters were       monitored: oxygen saturation, heart rate, blood pressure, respiratory       rate, EKG, adequacy of pulmonary ventilation, and response to care.       Total physician intraservice time was 27 minutes. Recommendation:           - Repeat colonoscopy in 5 years for screening                             purposes.                           - Return to GI office PRN. Procedure Code(s):        --- Professional ---                           [phone], Colonoscopy, flexible; diagnostic, including                            collection of specimen(s) by brushing or washing,                            when performed (separate procedure)                           99153, Moderate sedation; each additional 15                            minutes intraservice time                           G0500, Moderate sedation services provided by the                            same physician or other qualified health care                            professional performing a gastrointestinal                            endoscopic service that sedation supports,                            requiring the presence of an independent trained                            observer to assist in the monitoring of the  patient's level of consciousness and physiological                            status; initial 15 minutes of intra-service time;                            patient age 72 years or older (additional time may                            be reported with 85631, as appropriate) Diagnosis Code(s):        --- Professional ---                           Z12.11, Encounter for screening for malignant                            neoplasm of colon                           K64.2, Third degree hemorrhoids CPT copyright 2019 American Medical Association. All rights reserved. The codes documented in this report are preliminary and upon coder review may  be revised to meet current compliance requirements. Gerrit Friends. Makih Stefanko, MD Gennette Pac, MD 05/02/2019 8:29:07 AM This report has been signed electronically. Number of Addenda: 0

## 2019-05-02 NOTE — Discharge Instructions (Signed)
Colonoscopy Discharge Instructions  Read the instructions outlined below and refer to this sheet in the next few weeks. These discharge instructions provide you with general information on caring for yourself after you leave the hospital. Your doctor may also give you specific instructions. While your treatment has been planned according to the most current medical practices available, unavoidable complications occasionally occur. If you have any problems or questions after discharge, call Dr. [name] at [phone]. ACTIVITY  You may resume your regular activity, but move at a slower pace for the next 24 hours.   Take frequent rest periods for the next 24 hours.   Walking will help get rid of the air and reduce the bloated feeling in your belly (abdomen).   No driving for 24 hours (because of the medicine (anesthesia) used during the test).    Do not sign any important legal documents or operate any machinery for 24 hours (because of the anesthesia used during the test).  NUTRITION  Drink plenty of fluids.   You may resume your normal diet as instructed by your doctor.   Begin with a light meal and progress to your normal diet. Heavy or fried foods are harder to digest and may make you feel sick to your stomach (nauseated).   Avoid alcoholic beverages for 24 hours or as instructed.  MEDICATIONS  You may resume your normal medications unless your doctor tells you otherwise.  WHAT YOU CAN EXPECT TODAY  Some feelings of bloating in the abdomen.   Passage of more gas than usual.   Spotting of blood in your stool or on the toilet paper.  IF YOU HAD POLYPS REMOVED DURING THE COLONOSCOPY:  No aspirin products for 7 days or as instructed.   No alcohol for 7 days or as instructed.   Eat a soft diet for the next 24 hours.  FINDING OUT THE RESULTS OF YOUR TEST Not all test results are available during your visit. If your test results are not back during the visit, make an appointment  with your caregiver to find out the results. Do not assume everything is normal if you have not heard from your caregiver or the medical facility. It is important for you to follow up on all of your test results.  SEEK IMMEDIATE MEDICAL ATTENTION IF:  You have more than a spotting of blood in your stool.   Your belly is swollen (abdominal distention).   You are nauseated or vomiting.   You have a temperature over 101.   You have abdominal pain or discomfort that is severe or gets worse throughout the day.   Hemorrhoid information provided  Repeat colonoscopy in 5 years  Resume Eliquis today  At patient request, I called husband Dwayne at [phone] and reviewed results.   Hemorrhoids Hemorrhoids are swollen veins in and around the rectum or anus. There are two types of hemorrhoids:  Internal hemorrhoids. These occur in the veins that are just inside the rectum. They may poke through to the outside and become irritated and painful.  External hemorrhoids. These occur in the veins that are outside the anus and can be felt as a painful swelling or hard lump near the anus. Most hemorrhoids do not cause serious problems, and they can be managed with home treatments such as diet and lifestyle changes. If home treatments do not help the symptoms, procedures can be done to shrink or remove the hemorrhoids. What are the causes? This condition is caused by increased pressure in the  anal area. This pressure may result from various things, including:  Constipation.  Straining to have a bowel movement.  Diarrhea.  Pregnancy.  Obesity.  Sitting for long periods of time.  Heavy lifting or other activity that causes you to strain.  Anal sex.  Riding a bike for a long period of time. What are the signs or symptoms? Symptoms of this condition include:  Pain.  Anal itching or irritation.  Rectal bleeding.  Leakage of stool (feces).  Anal swelling.  One or more lumps around  the anus. How is this diagnosed? This condition can often be diagnosed through a visual exam. Other exams or tests may also be done, such as:  An exam that involves feeling the rectal area with a gloved hand (digital rectal exam).  An exam of the anal canal that is done using a small tube (anoscope).  A blood test, if you have lost a significant amount of blood.  A test to look inside the colon using a flexible tube with a camera on the end (sigmoidoscopy or colonoscopy). How is this treated? This condition can usually be treated at home. However, various procedures may be done if dietary changes, lifestyle changes, and other home treatments do not help your symptoms. These procedures can help make the hemorrhoids smaller or remove them completely. Some of these procedures involve surgery, and others do not. Common procedures include:  Rubber band ligation. Rubber bands are placed at the base of the hemorrhoids to cut off their blood supply.  Sclerotherapy. Medicine is injected into the hemorrhoids to shrink them.  Infrared coagulation. A type of light energy is used to get rid of the hemorrhoids.  Hemorrhoidectomy surgery. The hemorrhoids are surgically removed, and the veins that supply them are tied off.  Stapled hemorrhoidopexy surgery. The surgeon staples the base of the hemorrhoid to the rectal wall. Follow these instructions at home: Eating and drinking   Eat foods that have a lot of fiber in them, such as whole grains, beans, nuts, fruits, and vegetables.  Ask your health care provider about taking products that have added fiber (fiber supplements).  Reduce the amount of fat in your diet. You can do this by eating low-fat dairy products, eating less red meat, and avoiding processed foods.  Drink enough fluid to keep your urine pale yellow. Managing pain and swelling   Take warm sitz baths for 20 minutes, 3-4 times a day to ease pain and discomfort. You may do this in a  bathtub or using a portable sitz bath that fits over the toilet.  If directed, apply ice to the affected area. Using ice packs between sitz baths may be helpful. ? Put ice in a plastic bag. ? Place a towel between your skin and the bag. ? Leave the ice on for 20 minutes, 2-3 times a day. General instructions  Take over-the-counter and prescription medicines only as told by your health care provider.  Use medicated creams or suppositories as told.  Get regular exercise. Ask your health care provider how much and what kind of exercise is best for you. In general, you should do moderate exercise for at least 30 minutes on most days of the week (150 minutes each week). This can include activities such as walking, biking, or yoga.  Go to the bathroom when you have the urge to have a bowel movement. Do not wait.  Avoid straining to have bowel movements.  Keep the anal area dry and clean. Use wet toilet  paper or moist towelettes after a bowel movement.  Do not sit on the toilet for long periods of time. This increases blood pooling and pain.  Keep all follow-up visits as told by your health care provider. This is important. Contact a health care provider if you have:  Increasing pain and swelling that are not controlled by treatment or medicine.  Difficulty having a bowel movement, or you are unable to have a bowel movement.  Pain or inflammation outside the area of the hemorrhoids. Get help right away if you have:  Uncontrolled bleeding from your rectum. Summary  Hemorrhoids are swollen veins in and around the rectum or anus.  Most hemorrhoids can be managed with home treatments such as diet and lifestyle changes.  Taking warm sitz baths can help ease pain and discomfort.  In severe cases, procedures or surgery can be done to shrink or remove the hemorrhoids. This information is not intended to replace advice given to you by your health care provider. Make sure you discuss any  questions you have with your health care provider. Document Revised: 08/25/2018 Document Reviewed: 08/18/2017 Elsevier Patient Education  2020 [employer].

## 2019-05-04 DIAGNOSIS — Z23 Encounter for immunization: Secondary | ICD-10-CM | POA: Diagnosis not present

## 2019-06-01 DIAGNOSIS — Z23 Encounter for immunization: Secondary | ICD-10-CM | POA: Diagnosis not present

## 2019-07-18 ENCOUNTER — Ambulatory Visit: Payer: Medicare Other | Admitting: Nurse Practitioner

## 2019-07-31 ENCOUNTER — Other Ambulatory Visit (HOSPITAL_COMMUNITY): Payer: Self-pay | Admitting: Adult Health

## 2019-07-31 DIAGNOSIS — Z1231 Encounter for screening mammogram for malignant neoplasm of breast: Secondary | ICD-10-CM

## 2019-08-06 ENCOUNTER — Other Ambulatory Visit: Payer: Self-pay

## 2019-08-06 ENCOUNTER — Telehealth: Payer: Self-pay | Admitting: Adult Health

## 2019-08-06 ENCOUNTER — Ambulatory Visit (HOSPITAL_COMMUNITY)
Admission: RE | Admit: 2019-08-06 | Discharge: 2019-08-06 | Disposition: A | Discharge status: Home / Self Care | Payer: Medicare Other | Source: Ambulatory Visit | Attending: Adult Health | Admitting: Adult Health

## 2019-08-06 DIAGNOSIS — Z1231 Encounter for screening mammogram for malignant neoplasm of breast: Secondary | ICD-10-CM | POA: Diagnosis not present

## 2019-08-06 IMAGING — MG DIGITAL SCREENING BILAT W/ TOMO W/ CAD
8 series · 8 of 24 positions shown · non-contrast
Comparison: Previous exam(s).

CLINICAL DATA: Screening.

EXAM:
DIGITAL SCREENING BILATERAL MAMMOGRAM WITH TOMO AND CAD

[R MLO synth-2D]
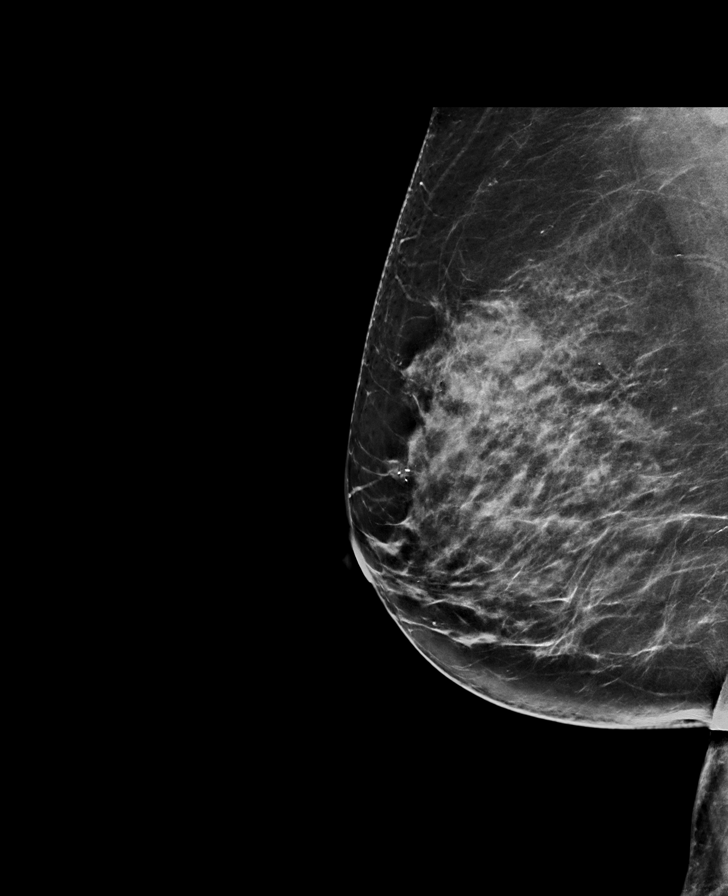

[L CC synth-2D]
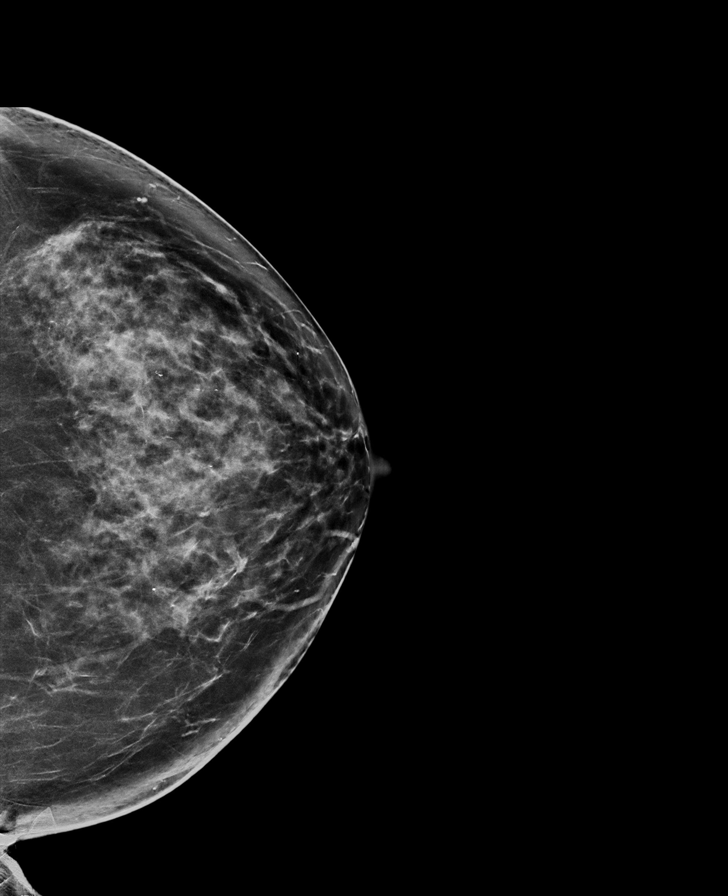

[R CC synth-2D]
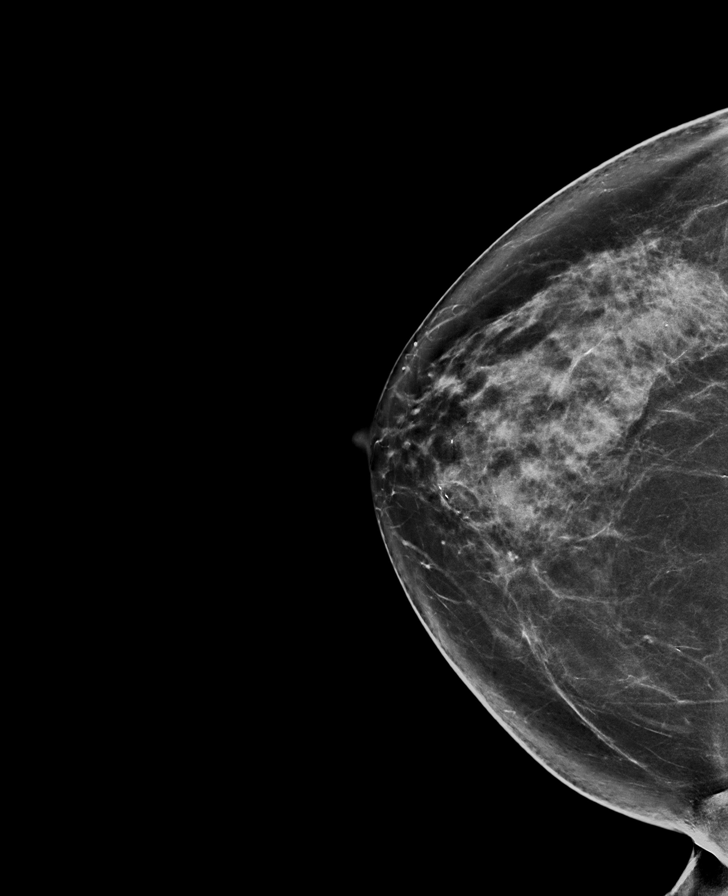

[L MLO synth-2D]
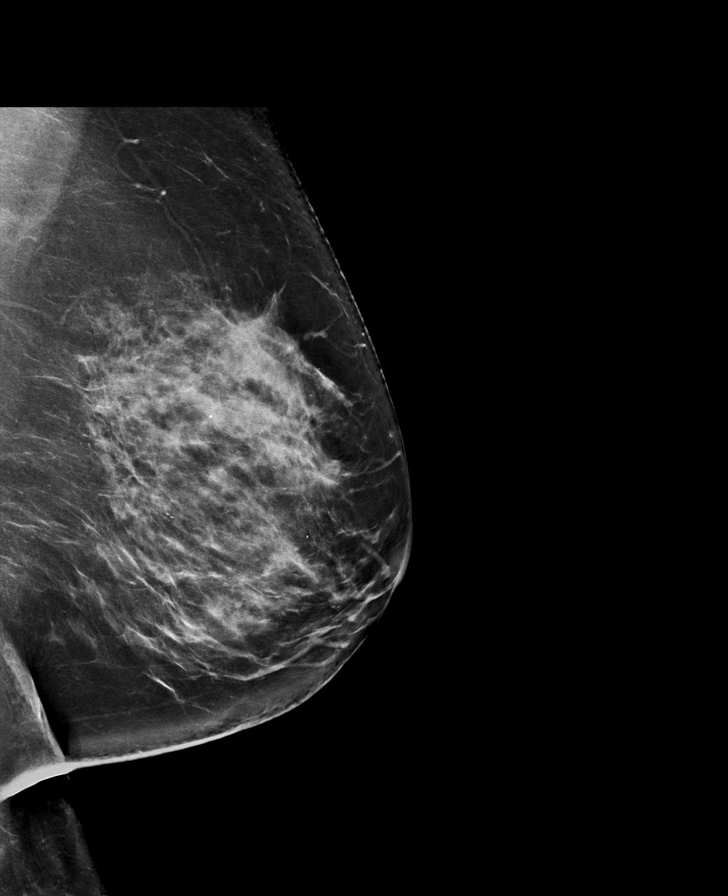

[R MLO tomo · tomo slice 42/83.0]
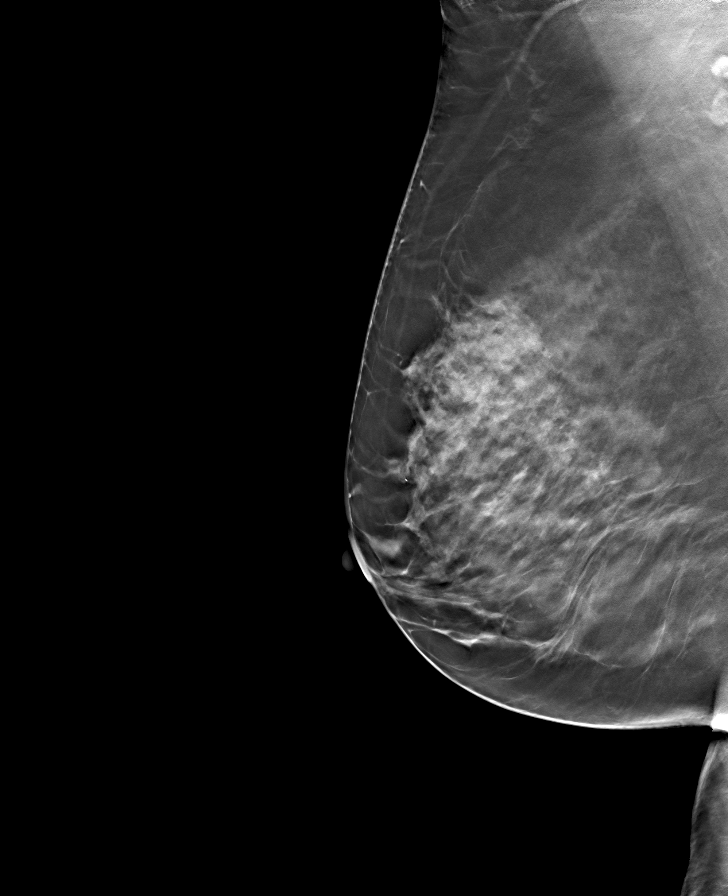

[R CC tomo · tomo slice 43/84.0]
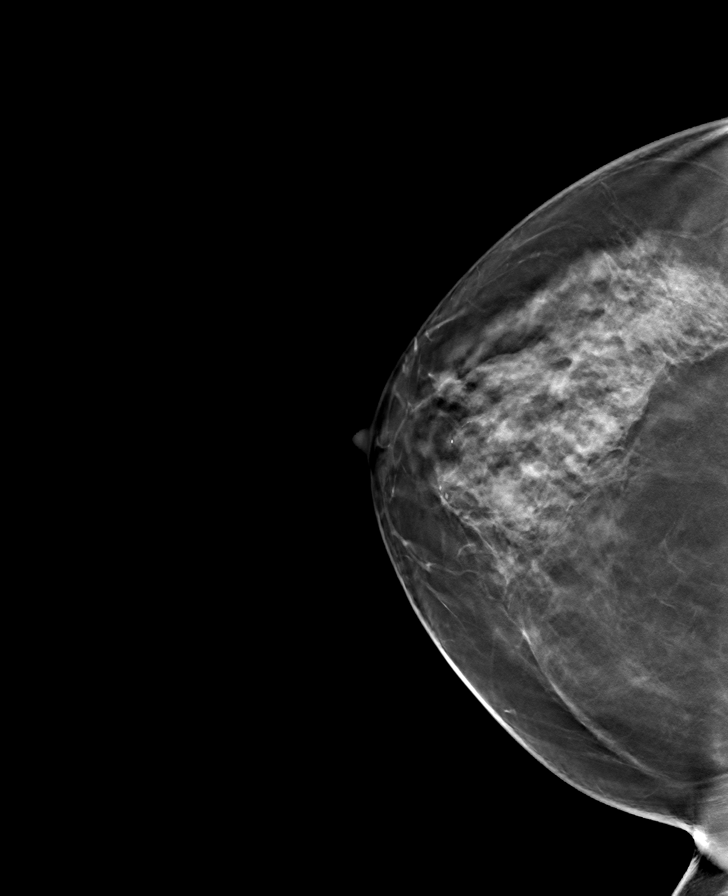

[L MLO tomo · tomo slice 47/93.0]
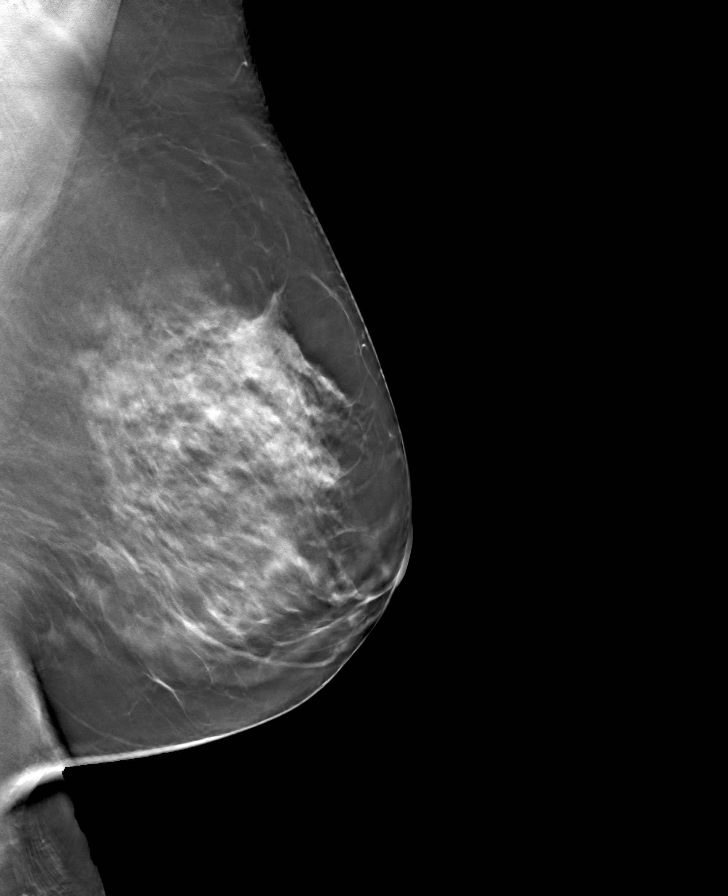

[L CC tomo · tomo slice 43/85.0]
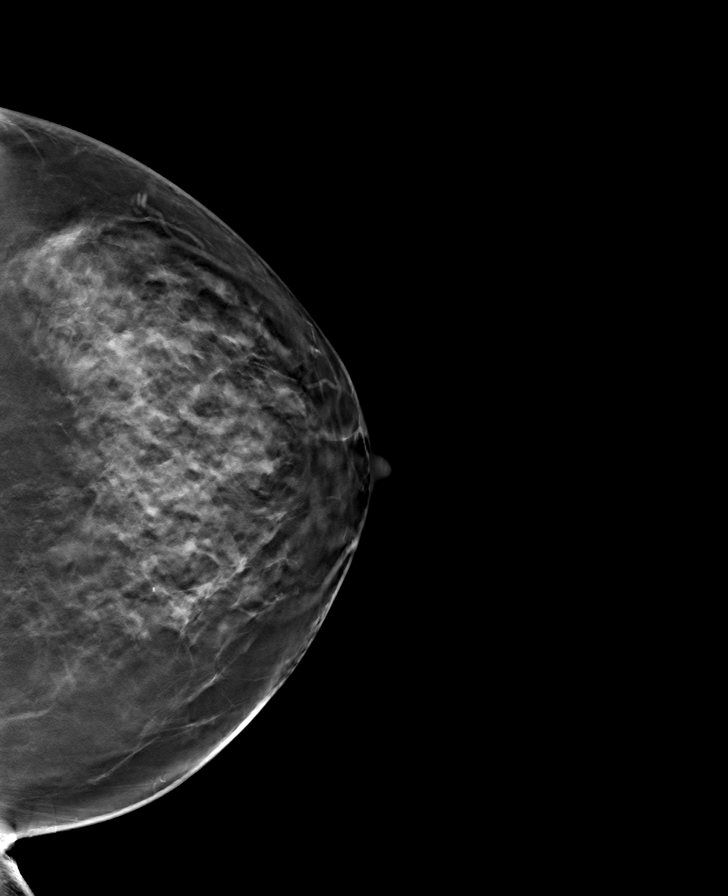

[8 of 24 positions shown; findings below may reference images not displayed]

ACR Breast Density Category c: The breast tissue is heterogeneously
dense, which may obscure small masses.
FINDINGS: In the left breast, possible distortion warrants further evaluation.
In the right breast, no findings suspicious for malignancy. Images
were processed with CAD.
IMPRESSION: Further evaluation is suggested for possible distortion in the left
breast.

RECOMMENDATION:
Diagnostic mammogram and possibly ultrasound of the left breast.
(Code:[JM])

The patient will be contacted regarding the findings, and additional
imaging will be scheduled.

BI-RADS CATEGORY  0: Incomplete. Need additional imaging evaluation
and/or prior mammograms for comparison.

## 2019-08-06 NOTE — Telephone Encounter (Signed)
Pt aware that needs follow up on mammogram ?distortion left breast and that Xray will get in touch to schedule

## 2019-08-07 ENCOUNTER — Other Ambulatory Visit (HOSPITAL_COMMUNITY): Payer: Self-pay | Admitting: Adult Health

## 2019-08-07 DIAGNOSIS — R928 Other abnormal and inconclusive findings on diagnostic imaging of breast: Secondary | ICD-10-CM

## 2019-08-14 ENCOUNTER — Other Ambulatory Visit: Payer: Self-pay

## 2019-08-14 ENCOUNTER — Ambulatory Visit (HOSPITAL_COMMUNITY)
Admission: RE | Admit: 2019-08-14 | Discharge: 2019-08-14 | Disposition: A | Discharge status: Home / Self Care | Payer: Medicare Other | Source: Ambulatory Visit | Attending: Adult Health | Admitting: Adult Health

## 2019-08-14 DIAGNOSIS — R922 Inconclusive mammogram: Secondary | ICD-10-CM | POA: Diagnosis not present

## 2019-08-14 DIAGNOSIS — R928 Other abnormal and inconclusive findings on diagnostic imaging of breast: Secondary | ICD-10-CM | POA: Diagnosis not present

## 2019-08-14 IMAGING — MG MM DIGITAL DIAGNOSTIC UNILAT*L* W/ TOMO W/ CAD
6 series · 6 of 18 positions shown · non-contrast
Comparison: Previous exams.

CLINICAL DATA: Screening recall for possible left breast
distortion.

EXAM:
DIGITAL DIAGNOSTIC UNILATERAL LEFT MAMMOGRAM WITH CAD AND TOMO

[L MLO synth-2D (1 of 2)]
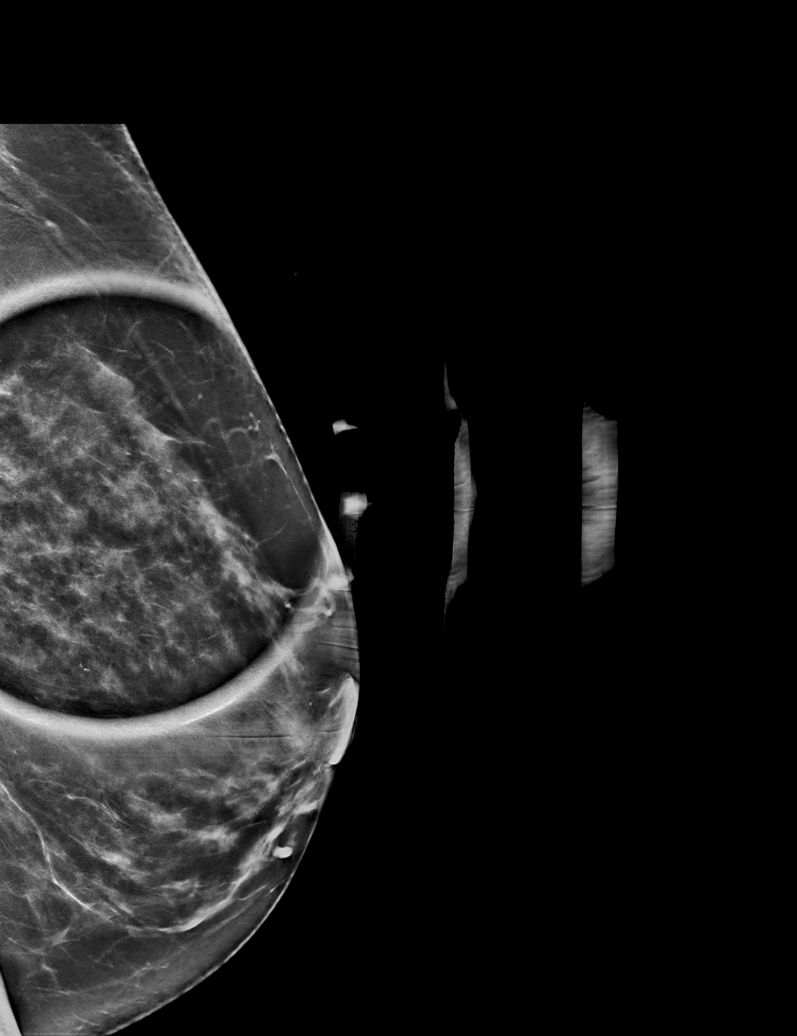

[L MLO synth-2D (2 of 2)]
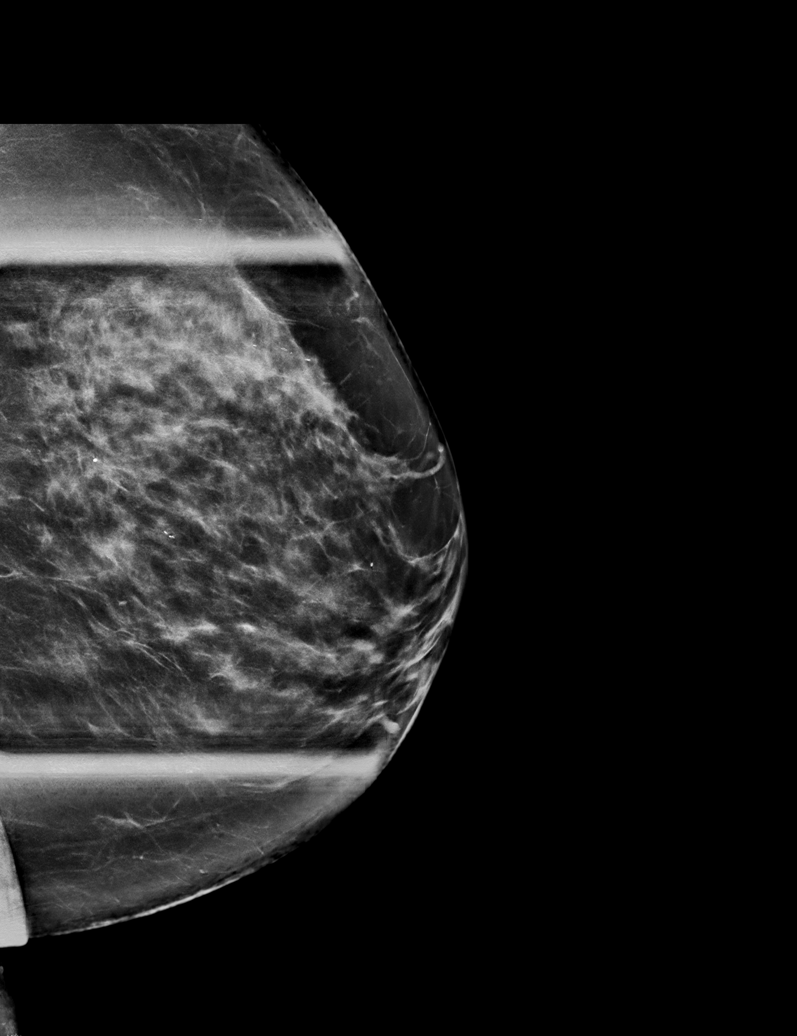

[L CC synth-2D]
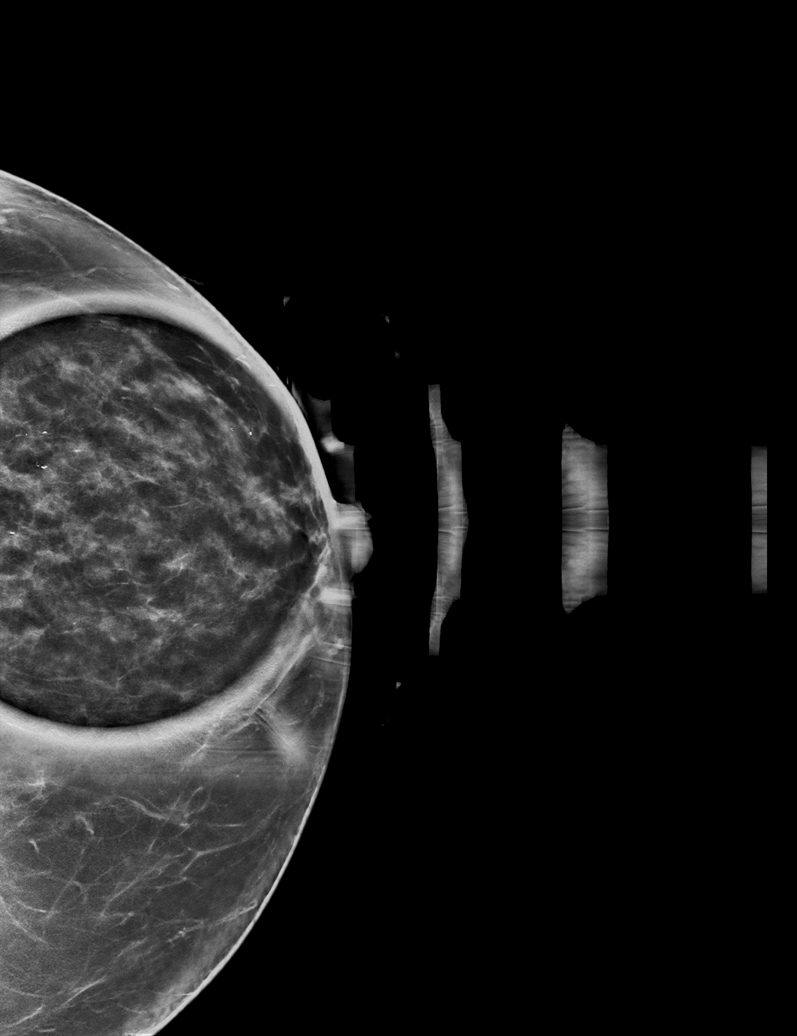

[L MLO tomo (1 of 2) · tomo slice 44/87.0]
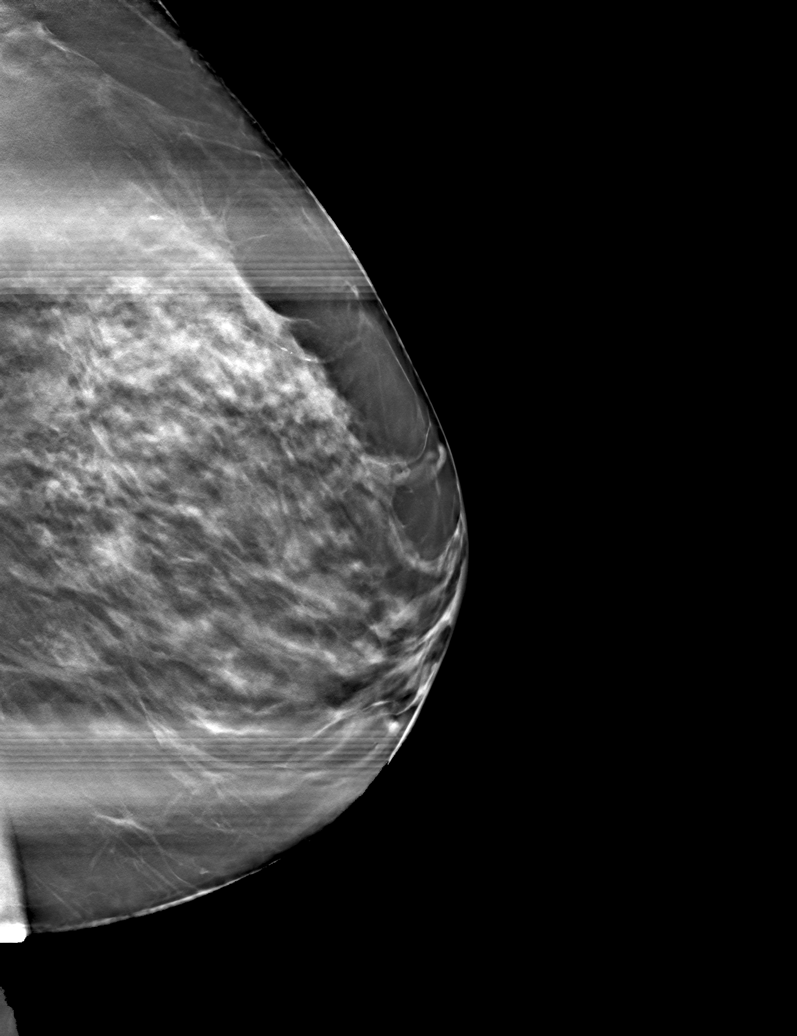

[L CC tomo · tomo slice 36/71.0]
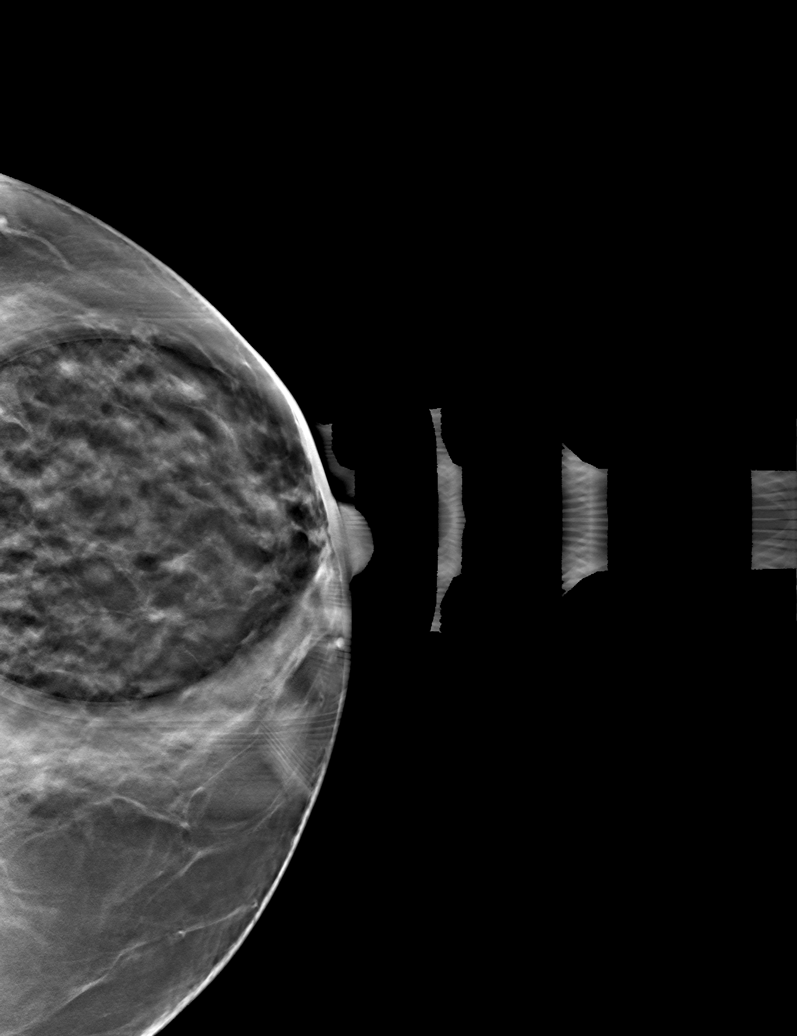

[L MLO tomo (2 of 2) · tomo slice 43/84.0]
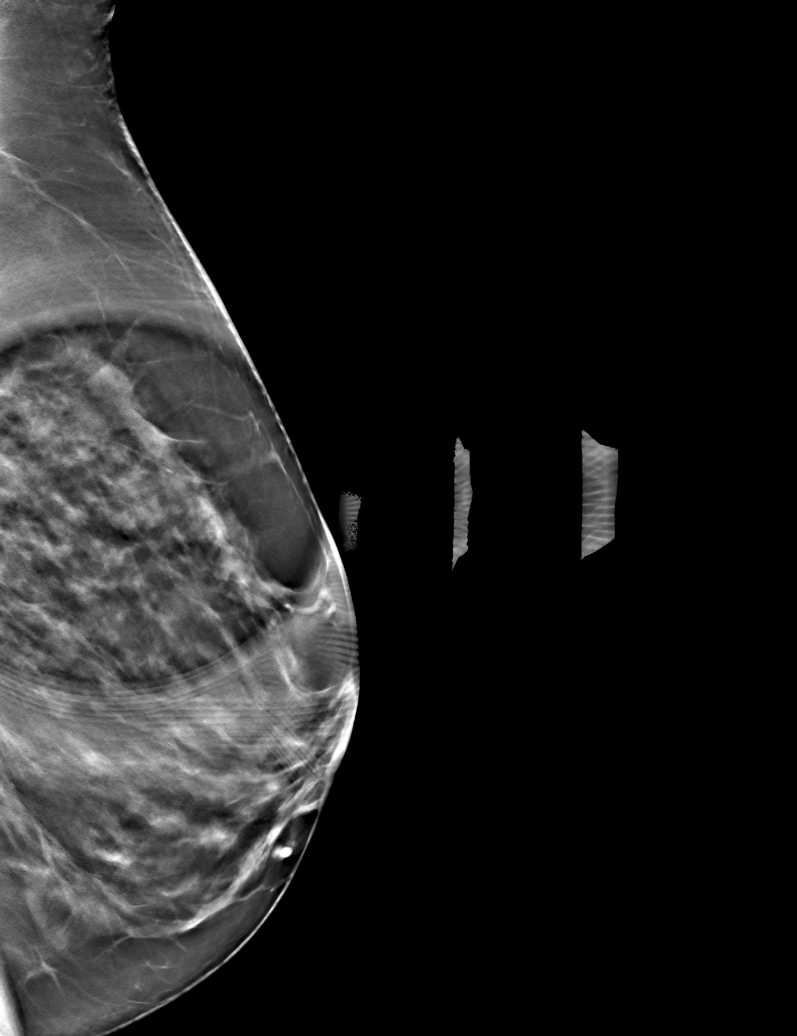

[6 of 18 positions shown; findings below may reference images not displayed]

ACR Breast Density Category c: The breast tissue is heterogeneously
dense, which may obscure small masses.
FINDINGS: Spot compression tomograms were performed over the slightly outer
left breast. The initially questioned possible left breast
distortion resolves on the additional imaging with findings
compatible with an area of overlapping fibroglandular tissue. There
is no mammographic evidence of malignancy in the left breast.

Mammographic images were processed with CAD.
IMPRESSION: No mammographic evidence of malignancy in the left breast.

RECOMMENDATION:
Screening mammogram in one year.(Code:[3W])

I have discussed the findings and recommendations with the patient.
If applicable, a reminder letter will be sent to the patient
regarding the next appointment.

BI-RADS CATEGORY  1: Negative.

## 2019-09-12 ENCOUNTER — Other Ambulatory Visit: Payer: Self-pay | Admitting: Internal Medicine

## 2019-09-26 ENCOUNTER — Other Ambulatory Visit: Payer: Self-pay

## 2019-09-26 NOTE — Telephone Encounter (Signed)
This is a [place] pt.  °

## 2019-09-27 MED ORDER — FLECAINIDE ACETATE 100 MG PO TABS: 100.0000 mg | ORAL_TABLET | Freq: Two times a day (BID) | ORAL | 3 refills | Status: DC

## 2019-11-15 ENCOUNTER — Telehealth: Payer: Self-pay

## 2019-11-15 MED ORDER — DILTIAZEM HCL ER COATED BEADS 180 MG PO CP24: 180.0000 mg | ORAL_CAPSULE | Freq: Every day | ORAL | 0 refills | Status: DC

## 2019-11-15 NOTE — Telephone Encounter (Signed)
refilled 12 days supply of diltiazem to [facility]

## 2019-12-11 DIAGNOSIS — H26491 Other secondary cataract, right eye: Secondary | ICD-10-CM | POA: Diagnosis not present

## 2019-12-11 DIAGNOSIS — H353131 Nonexudative age-related macular degeneration, bilateral, early dry stage: Secondary | ICD-10-CM | POA: Diagnosis not present

## 2019-12-11 DIAGNOSIS — H43813 Vitreous degeneration, bilateral: Secondary | ICD-10-CM | POA: Diagnosis not present

## 2019-12-11 DIAGNOSIS — Z961 Presence of intraocular lens: Secondary | ICD-10-CM | POA: Diagnosis not present

## 2020-01-16 ENCOUNTER — Other Ambulatory Visit: Payer: Self-pay | Admitting: Nurse Practitioner

## 2020-02-06 ENCOUNTER — Other Ambulatory Visit: Payer: Self-pay | Admitting: Internal Medicine

## 2020-02-14 ENCOUNTER — Encounter: Payer: Self-pay | Admitting: Internal Medicine

## 2020-02-14 ENCOUNTER — Ambulatory Visit (INDEPENDENT_AMBULATORY_CARE_PROVIDER_SITE_OTHER): Payer: Medicare Other | Admitting: Internal Medicine

## 2020-02-14 ENCOUNTER — Other Ambulatory Visit: Payer: Self-pay

## 2020-02-14 VITALS — BP 142/82 | HR 66 | Ht 67.0 in | Wt 195.6 lb

## 2020-02-14 DIAGNOSIS — I10 Essential (primary) hypertension: Secondary | ICD-10-CM

## 2020-02-14 DIAGNOSIS — I480 Paroxysmal atrial fibrillation: Secondary | ICD-10-CM

## 2020-02-14 MED ORDER — DILTIAZEM HCL ER COATED BEADS 180 MG PO CP24: 180.0000 mg | ORAL_CAPSULE | Freq: Every day | ORAL | 3 refills | Status: DC

## 2020-02-14 MED ORDER — FLECAINIDE ACETATE 100 MG PO TABS: 100.0000 mg | ORAL_TABLET | Freq: Two times a day (BID) | ORAL | 3 refills | Status: DC

## 2020-02-14 NOTE — Patient Instructions (Signed)
Medication Instructions:  Your physician recommends that you continue on your current medications as directed. Please refer to the Current Medication list given to you today.  *If you need a refill on your cardiac medications before your next appointment, please call your pharmacy*   Lab Work: NONE   If you have labs (blood work) drawn today and your tests are completely normal, you will receive your results only by: . MyChart Message (if you have MyChart) OR . A paper copy in the mail If you have any lab test that is abnormal or we need to change your treatment, we will call you to review the results.   Testing/Procedures: NONE    Follow-Up: At [facility], you and your health needs are our priority.  As part of our continuing mission to provide you with exceptional heart care, we have created designated Provider Care Teams.  These Care Teams include your primary Cardiologist (physician) and Advanced Practice Providers (APPs -  Physician Assistants and Nurse Practitioners) who all work together to provide you with the care you need, when you need it.  We recommend signing up for the patient portal called "MyChart".  Sign up information is provided on this After Visit Summary.  MyChart is used to connect with patients for Virtual Visits (Telemedicine).  Patients are able to view lab/test results, encounter notes, upcoming appointments, etc.  Non-urgent messages can be sent to your provider as well.   To learn more about what you can do with MyChart, go to [email or web address].    Your next appointment:   1 year(s)  The format for your next appointment:   In Person  Provider:   Gregg Taylor, MD   Other Instructions Thank you for choosing [facility]!    

## 2020-02-14 NOTE — Progress Notes (Signed)
HPI Renee Benitez returns today for followup. She is a pleasant 69 yo woman with obesity, PAF, HTN, on flecainide. She has had only a few episodes of atrial fib and she has been well controlled. No chest pain or sob. No syncope. She remains active kayaking. She has some knee pain.  No Known Allergies   Current Outpatient Medications  Medication Sig Dispense Refill  . acetaminophen (TYLENOL) 500 MG tablet Take 1,000 mg by mouth every 6 (six) hours as needed for moderate pain or headache.    . Calcium Carb-Cholecalciferol (CALCIUM 600 + D PO) Take 1 tablet by mouth daily.    Marland Kitchen CARTIA XT 180 MG 24 hr capsule TAKE 1 CAPSULE BY MOUTH  DAILY 90 capsule 3  . Cholecalciferol (DIALYVITE VITAMIN D 5000) 125 MCG (5000 UT) capsule Take 5,000 Units by mouth daily.    Marland Kitchen diltiazem (CARDIZEM) 60 MG tablet Take 1 tablet (60 mg total) by mouth every 6 (six) hours as needed. (Patient taking differently: Take 60 mg by mouth every 6 (six) hours as needed (afib). ) 120 tablet 6  . ELIQUIS 5 MG TABS tablet TAKE ONE TABLET (5MG  TOTAL) BY MOUTH TWOTIMES DAILY 180 tablet 3  . flecainide (TAMBOCOR) 100 MG tablet Take 1 tablet (100 mg total) by mouth 2 (two) times daily. 180 tablet 3  . loratadine (CLARITIN) 10 MG tablet Take 10 mg by mouth daily as needed for allergies.    . Magnesium 250 MG TABS Take 250 mg by mouth at bedtime.     . Multiple Vitamins-Minerals (MULTIVITAMIN WITH MINERALS) tablet Take 1 tablet by mouth daily.      . Multiple Vitamins-Minerals (PRESERVISION AREDS 2) CAPS Take 1 capsule by mouth 2 (two) times daily.    . Omega-3 Fatty Acids (FISH OIL ULTRA) 1400 MG CAPS Take 1,400 mg by mouth daily.    . pantoprazole (PROTONIX) 40 MG tablet TAKE ONE TABLET (40MG  TOTAL) BY MOUTH DAILY 90 tablet 3  . Simethicone (GAS-X PO) Take 1-2 tablets by mouth daily as needed (gas).     No current facility-administered medications for this visit.     Past Medical History:  Diagnosis Date  . Atrial  fibrillation (HCC)   . Current use of estrogen therapy 01/31/2013  . Dysrhythmia    AFib  . Hypertension   . Macular degeneration   . Menopausal syndrome   . Right ovarian cyst 01/25/2014    ROS:   All systems reviewed and negative except as noted in the HPI.   Past Surgical History:  Procedure Laterality Date  . ABDOMINAL HYSTERECTOMY    . APPENDECTOMY    . CARDIOVERSION N/A 09/14/2016   Procedure: CARDIOVERSION;  Surgeon: 01/27/2014, MD;  Location: [facility];  Service: Cardiovascular;  Laterality: N/A;  . CATARACT EXTRACTION W/ INTRAOCULAR LENS  IMPLANT, BILATERAL Bilateral   . COLONOSCOPY  12/04/03   Friable anal canal otherwise normal rectum and colon  . COLONOSCOPY N/A 03/19/2013   Dr. 12/06/03: diverticulosis, hemorrhoids. next TCS 10 years  . COLONOSCOPY N/A 05/02/2019   Procedure: COLONOSCOPY;  Surgeon: [name], MD;  Location: [facility];  Service: Endoscopy;  Laterality: N/A;  7:30am  . ESOPHAGOGASTRODUODENOSCOPY N/A 10/17/2018   Procedure: ESOPHAGOGASTRODUODENOSCOPY (EGD);  Surgeon: [name], MD;  Location: [facility];  Service: Endoscopy;  Laterality: N/A;  3:00pm  . MALONEY DILATION N/A 10/17/2018   Procedure: [name] DILATION;  Surgeon: 12/18/2018, MD;  Location: [facility];  Service: Endoscopy;  Laterality: N/A;  . TEE WITHOUT CARDIOVERSION N/A 09/14/2016   Procedure: TRANSESOPHAGEAL ECHOCARDIOGRAM (TEE) WITH PROPOFOL;  Surgeon: [name], MD;  Location: [facility];  Service: Cardiovascular;  Laterality: N/A;  . TONSILECTOMY, ADENOIDECTOMY, BILATERAL MYRINGOTOMY AND TUBES       Family History  Problem Relation Age of Onset  . Coronary artery disease Father   . Cancer Father        bladder cancer  . Arrhythmia Sister        Reportedly had ablation of SVT  . Hypertension Sister   . Colon cancer Sister 59       s/p surgical resection, no chemo/radiation  . Colon cancer Maternal Grandmother   . Hypertension Son      Social  History   Socioeconomic History  . Marital status: Married    Spouse name: Not on file  . Number of children: Not on file  . Years of education: Not on file  . Highest education level: Not on file  Occupational History  . Occupation: Nurse    Comment: Copywriter, advertising  Tobacco Use  . Smoking status: Never Smoker  . Smokeless tobacco: Never Used  . Tobacco comment: Never smoked  Vaping Use  . Vaping Use: Never used  Substance and Sexual Activity  . Alcohol use: Not Currently    Alcohol/week: 0.0 standard drinks    Comment: occ wine  . Drug use: No  . Sexual activity: Yes    Birth control/protection: Surgical    Comment: hyst  Other Topics Concern  . Not on file  Social History Narrative  . Not on file   Social Determinants of Health   Financial Resource Strain:   . Difficulty of Paying Living Expenses: Not on file  Food Insecurity:   . Worried About Programme researcher, broadcasting/film/video in the Last Year: Not on file  . Ran Out of Food in the Last Year: Not on file  Transportation Needs:   . Lack of Transportation (Medical): Not on file  . Lack of Transportation (Non-Medical): Not on file  Physical Activity:   . Days of Exercise per Week: Not on file  . Minutes of Exercise per Session: Not on file  Stress:   . Feeling of Stress : Not on file  Social Connections:   . Frequency of Communication with Friends and Family: Not on file  . Frequency of Social Gatherings with Friends and Family: Not on file  . Attends Religious Services: Not on file  . Active Member of Clubs or Organizations: Not on file  . Attends Banker Meetings: Not on file  . Marital Status: Not on file  Intimate Partner Violence:   . Fear of Current or Ex-Partner: Not on file  . Emotionally Abused: Not on file  . Physically Abused: Not on file  . Sexually Abused: Not on file     BP (!) 142/82   Pulse 66   Ht 5\' 7"  (1.702 m)   Wt 195 lb 9.6 oz (88.7 kg)   SpO2 99%   BMI 30.64 kg/m   Physical  Exam:  Well appearing NAD HEENT: Unremarkable Neck:  No JVD, no thyromegally Lymphatics:  No adenopathy Back:  No CVA tenderness Lungs:  Clear with no wheezes HEART:  Regular rate rhythm, no murmurs, no rubs, no clicks Abd:  soft, positive bowel sounds, no organomegally, no rebound, no guarding Ext:  2 plus pulses, no edema, no cyanosis, no clubbing  Skin:  No rashes no nodules Neuro:  CN II through XII intact, motor grossly intact  EKG - nsr with IRBBB   Assess/Plan: 1. PAF - she is maintaining NSR with no changes in her meds. 2. Obesity - we discussed intermittent fasting which I recommend 3. HTN - her bp is minimally elevated. We will continue her current meds. 4. coags - she will continue Eliquis. No falls or bleeding.  Sharlot Gowda Lazaria Schaben,MD

## 2020-02-21 DIAGNOSIS — Z23 Encounter for immunization: Secondary | ICD-10-CM | POA: Diagnosis not present

## 2020-02-22 NOTE — Addendum Note (Signed)
Addended by: Marlyn Corporal A on: 02/22/2020 10:35 AM   Modules accepted: Orders

## 2020-06-18 ENCOUNTER — Telehealth: Payer: Self-pay | Admitting: *Deleted

## 2020-06-18 ENCOUNTER — Other Ambulatory Visit: Payer: Self-pay | Admitting: *Deleted

## 2020-06-18 MED ORDER — DILTIAZEM HCL ER COATED BEADS 180 MG PO CP24: 180.0000 mg | ORAL_CAPSULE | Freq: Every day | ORAL | 3 refills | Status: DC

## 2020-06-18 MED ORDER — DILTIAZEM HCL 60 MG PO TABS: 60.0000 mg | ORAL_TABLET | Freq: Four times a day (QID) | ORAL | 6 refills | Status: DC | PRN

## 2020-06-18 NOTE — Telephone Encounter (Signed)
Pt notified that refills have been completed and that we are awaiting Dr. Lubertha Basque response regarding EKG or heart monitor.

## 2020-07-16 ENCOUNTER — Ambulatory Visit (INDEPENDENT_AMBULATORY_CARE_PROVIDER_SITE_OTHER): Payer: Medicare Other | Admitting: Adult Health

## 2020-07-16 ENCOUNTER — Encounter: Payer: Self-pay | Admitting: Adult Health

## 2020-07-16 ENCOUNTER — Other Ambulatory Visit: Payer: Self-pay

## 2020-07-16 VITALS — BP 136/83 | HR 104 | Ht 66.25 in | Wt 197.0 lb

## 2020-07-16 DIAGNOSIS — Z1231 Encounter for screening mammogram for malignant neoplasm of breast: Secondary | ICD-10-CM

## 2020-07-16 DIAGNOSIS — Z01419 Encounter for gynecological examination (general) (routine) without abnormal findings: Secondary | ICD-10-CM | POA: Diagnosis not present

## 2020-07-16 DIAGNOSIS — Z1211 Encounter for screening for malignant neoplasm of colon: Secondary | ICD-10-CM | POA: Diagnosis not present

## 2020-07-16 DIAGNOSIS — R350 Frequency of micturition: Secondary | ICD-10-CM | POA: Diagnosis not present

## 2020-07-16 LAB — POCT URINALYSIS DIPSTICK OB
Blood, UA: NEGATIVE
Glucose, UA: NEGATIVE
Ketones, UA: NEGATIVE
Leukocytes, UA: NEGATIVE
Nitrite, UA: NEGATIVE
POC,PROTEIN,UA: NEGATIVE

## 2020-07-16 LAB — HEMOCCULT GUIAC POC 1CARD (OFFICE): Fecal Occult Blood, POC: NEGATIVE

## 2020-07-16 MED ORDER — GEMTESA 75 MG PO TABS: ORAL_TABLET | ORAL | 0 refills | Status: DC

## 2020-07-16 NOTE — Progress Notes (Signed)
Patient ID: Renee Benitez, female   DOB: Dec 18, 1950, 70 y.o.   MRN: [id number] History of Present Illness: Renee Benitez is a 70 year old white female,married, sp hysterectomy in for a well woman gyn exam. 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  PCP is Dr Sudie Bailey.   Current Medications, Allergies, Past Medical History, Past Surgical History, Family History and Social History were reviewed in [employer] record.     Review of Systems:  Patient denies any headaches, hearing loss, fatigue, blurred vision, shortness of breath, chest pain, abdominal pain, problems with bowel movements, or intercourse. No mood swings. Has A fib, She has pain in her knees and low back at times  Having urinary frequency and some urge incontinence.  She has gained weight in last 2 years, and is trying to be more active has a new E Bike.    Physical Exam:BP 136/83 (BP Location: Right Arm, Patient Position: Sitting, Cuff Size: Normal)   Pulse (!) 104   Ht 5' 6.25" (1.683 m)   Wt 197 lb (89.4 kg)   BMI 31.56 kg/m  urine dipstick is negative.  General:  Well developed, well nourished, no acute distress Skin:  Warm and dry Neck:  Midline trachea, normal thyroid, good ROM, no lymphadenopathy,no carotid bruits heard Lungs; Clear to auscultation bilaterally Breast:  No dominant palpable mass, retraction, or nipple discharge Cardiovascular: Regular rate and rhythm Abdomen:  Soft, non tender, no hepatosplenomegaly Pelvic:  External genitalia is normal in appearance, no lesions.  The vagina is pale with loss of moisture and rugae. Urethra has no lesions or masses. The cervix and uterus is absent.  No adnexal masses or tenderness noted.Bladder is non tender, no masses felt. Rectal: Good sphincter tone, no polyps, or hemorrhoids felt.  Hemoccult negative. Extremities/musculoskeletal:  No swelling or varicosities noted, no clubbing or  cyanosis Psych:  No mood changes, alert and cooperative,seems happy AA is 1 Fall risk is low PHQ 9 score is 2 GAD 7 score is 2.  [facility] - 07/16/20 1031      Pregnancy Intention Screening   Does the patient want to become pregnant in the next year? N/A    Does the patient's partner want to become pregnant in the next year? N/A    Would the patient like to discuss contraceptive options today? N/A      Contraception Wrap Up   Current Method --   hyst   End Method --   hyst   Contraception Counseling Provided No          Examination chaperoned by Marchelle Folks LPN   Impression and Plan: 1. Urinary frequency Will try Gemtesa 75 mg 1 daily #42 tablets given as trial, she will let me know if helps or not   2. Well female exam with routine gynecological exam Physical in 2 years Colonoscopy per GI, every 5 years   3. Encounter for screening fecal occult blood testing   4. Screening Mammogram Pt to call for Mammogram, not due til May

## 2020-07-22 ENCOUNTER — Telehealth: Payer: Self-pay | Admitting: *Deleted

## 2020-07-22 ENCOUNTER — Ambulatory Visit (INDEPENDENT_AMBULATORY_CARE_PROVIDER_SITE_OTHER): Payer: Medicare Other | Admitting: *Deleted

## 2020-07-22 DIAGNOSIS — I480 Paroxysmal atrial fibrillation: Secondary | ICD-10-CM | POA: Diagnosis not present

## 2020-07-22 NOTE — Telephone Encounter (Signed)
Pt c/o palpitations for last several weeks and feels like her heart is beating different than normal HR ranging from 100-120 denies chest pain/dizziness - does c/o some SOB with exertion - has been taking extra Cardizem 60 mg several times daily over the past few weeks - pt will come for EKG today [facility] @ 3pm

## 2020-07-22 NOTE — Progress Notes (Signed)
Pt here for EKG vitals per pt reuest (see phone note) c/o SOB today and says "heart beat doesn't sound normal" and feels like she "has ran a race" when she does normal daily activites for the last few weeks - denies chest pain/dizziness - weight today 196lbs BP 138/92 O2 98% - EKG done which shows Afib - reviewed by Nena Polio, NP and will increase Cartia XT 360 mg daily for the next 5 days and continue dilt 60 mg prn and come back for repeat EKG - will forward to Dr Andrew Au and as requested by pt

## 2020-07-22 NOTE — Patient Instructions (Signed)
INCREASE CARTIA XT 360 MG FOR THE NEXT 5 DAYS   CONTINUE DILTIAZEM 60 MG AS NEEDED  REPEAT EKG IN 1 WEEK

## 2020-07-23 DIAGNOSIS — R102 Pelvic and perineal pain: Secondary | ICD-10-CM

## 2020-07-23 NOTE — Telephone Encounter (Signed)
Pt has pelvic pain more in LLQ, she started [place] and it has helped but will stop it for few days. I made pelvic US appt at [facility] for 4/19 at 1:30 pm, if pain increases go to Urgent Care

## 2020-07-29 ENCOUNTER — Other Ambulatory Visit: Payer: Self-pay

## 2020-07-29 ENCOUNTER — Ambulatory Visit (INDEPENDENT_AMBULATORY_CARE_PROVIDER_SITE_OTHER): Payer: Medicare Other | Admitting: *Deleted

## 2020-07-29 ENCOUNTER — Ambulatory Visit (HOSPITAL_COMMUNITY)
Admission: RE | Admit: 2020-07-29 | Discharge: 2020-07-29 | Disposition: A | Discharge status: Home / Self Care | Payer: Medicare Other | Source: Ambulatory Visit | Attending: Adult Health | Admitting: Adult Health

## 2020-07-29 DIAGNOSIS — I480 Paroxysmal atrial fibrillation: Secondary | ICD-10-CM

## 2020-07-29 DIAGNOSIS — R102 Pelvic and perineal pain: Secondary | ICD-10-CM | POA: Insufficient documentation

## 2020-07-29 IMAGING — US US PELVIS COMPLETE WITH TRANSVAGINAL
1 series · 14 of 25 positions shown · non-contrast
Comparison: [DATE]

CLINICAL DATA: Pelvic pain especially LEFT lower quadrant pain;
postmenopausal



[Series 1: us pelvic complete with transvaginal · 14 of 42 slices shown]
[im 1/42]
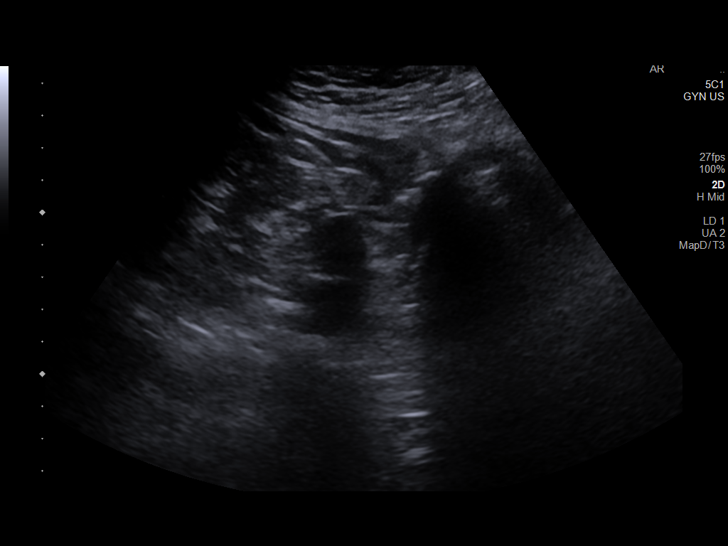
[im 4/42]
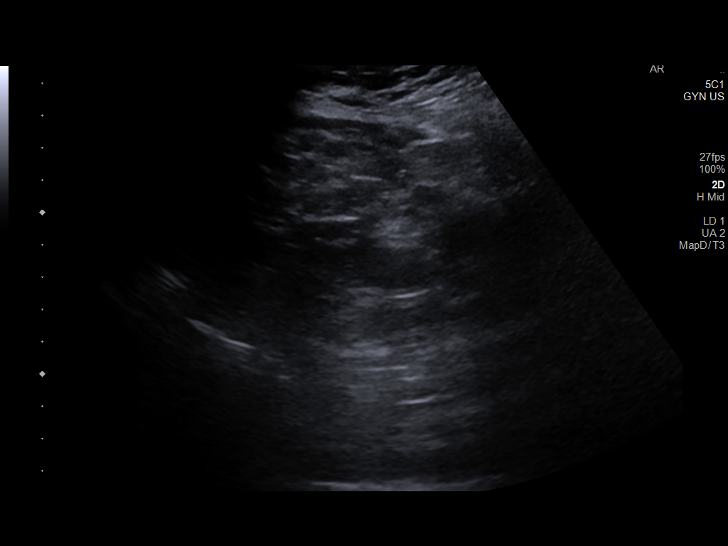
[im 7/42]
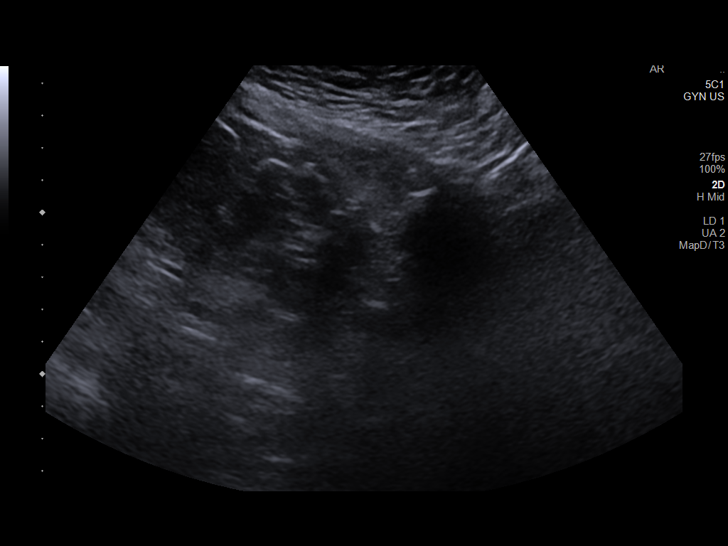
[im 11/42]
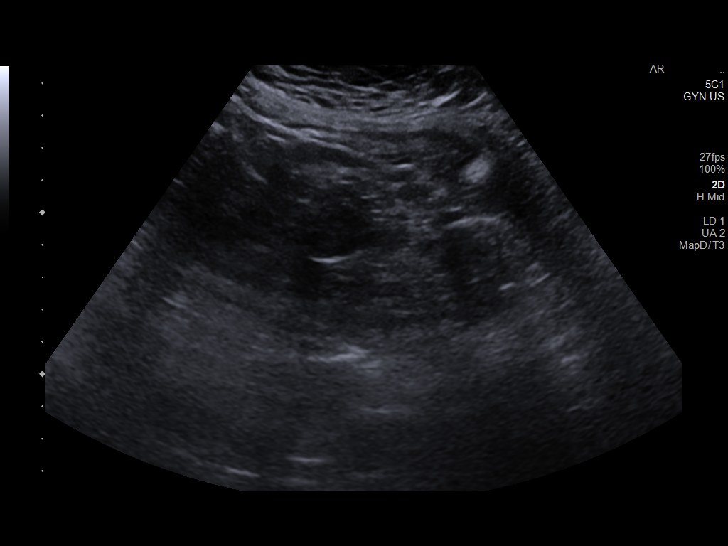
[im 14/42]
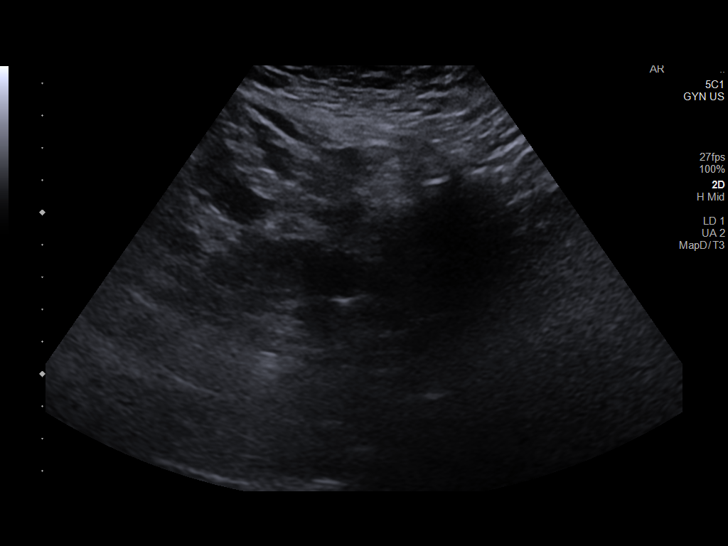
[im 16/42]
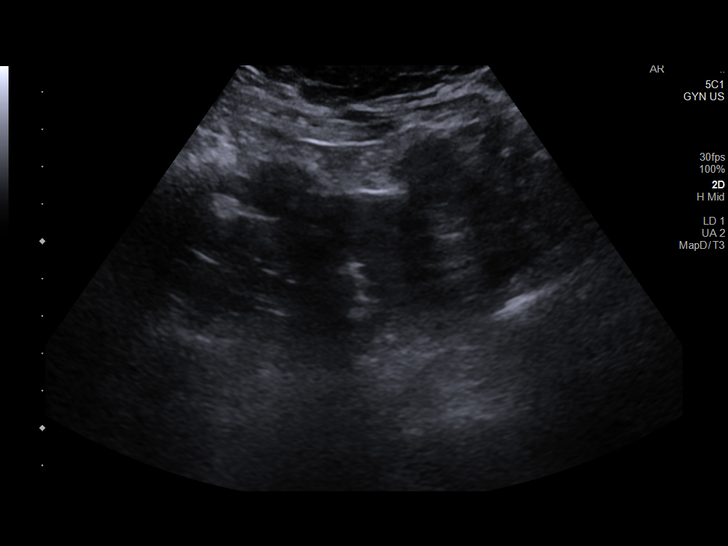
[im 19/42]
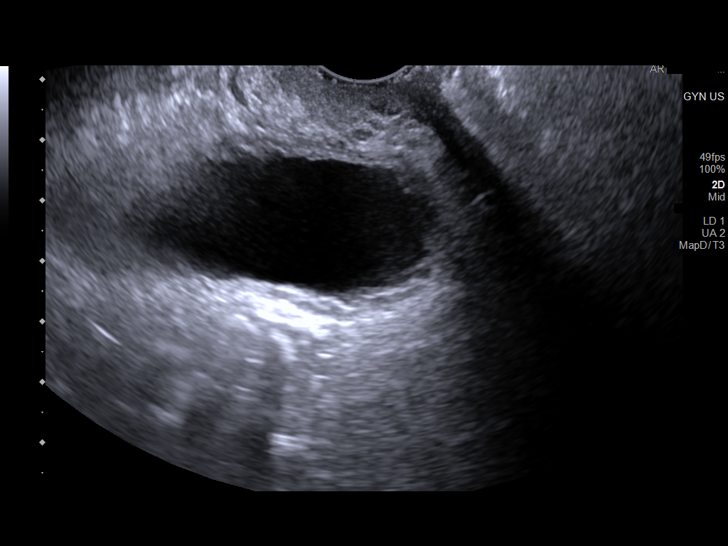
[im 23/42]
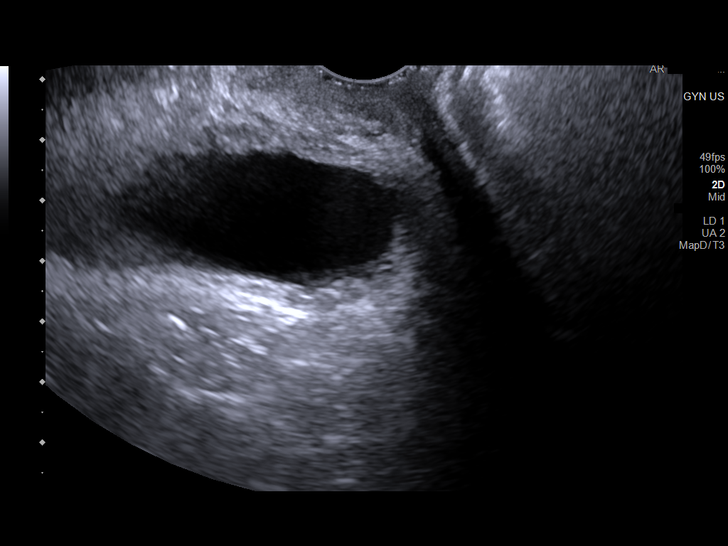
[im 26/42]
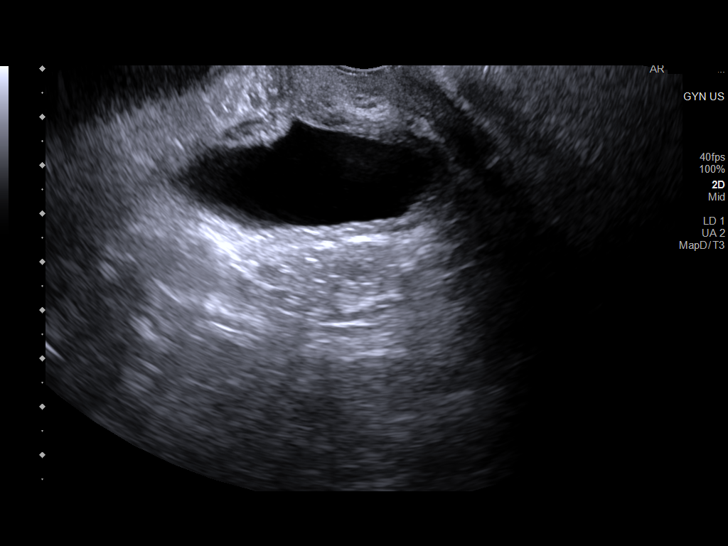
[im 28/42]
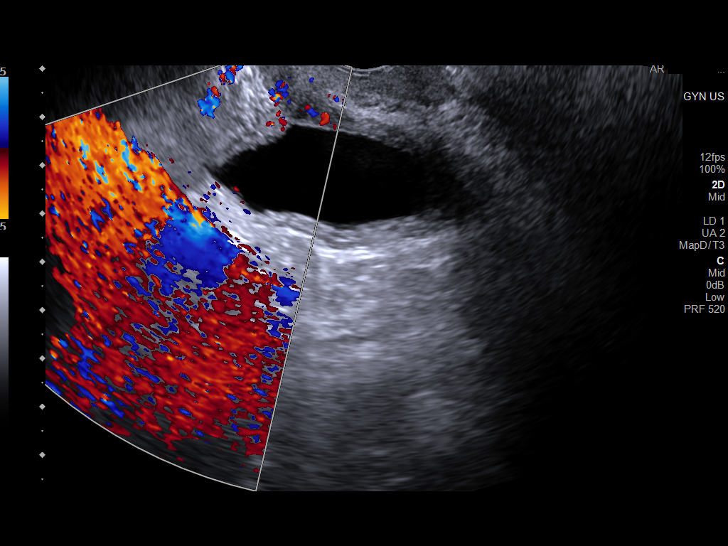
[im 31/42]
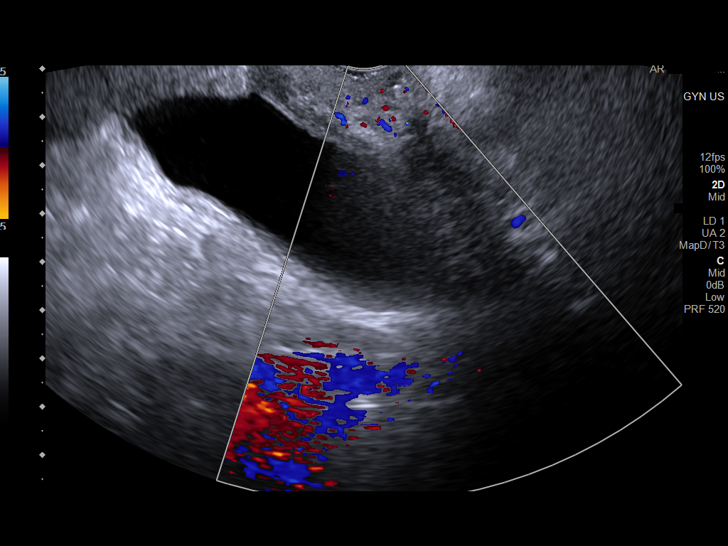
[im 35/42]
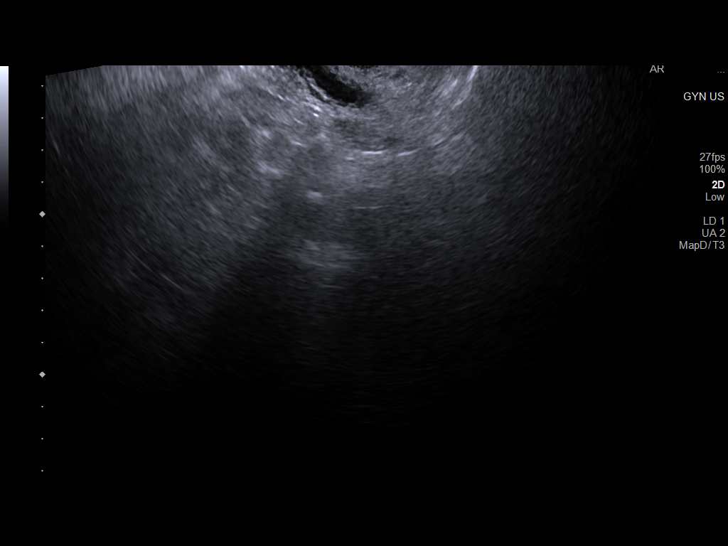
[im 38/42]
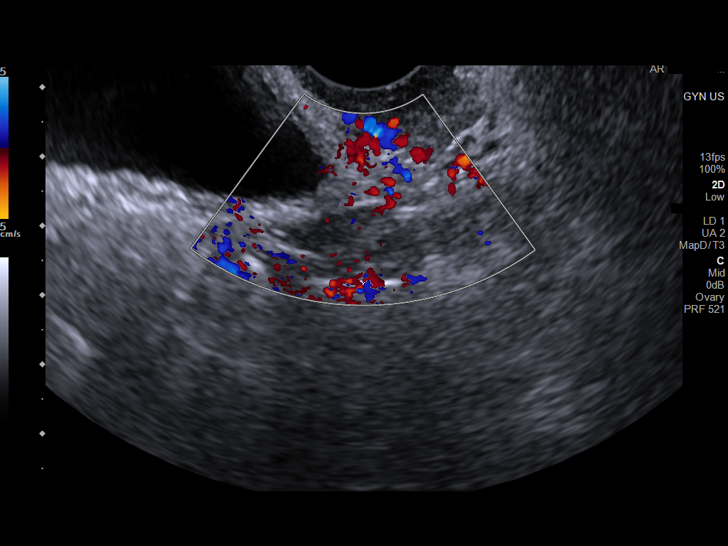
[im 42/42]
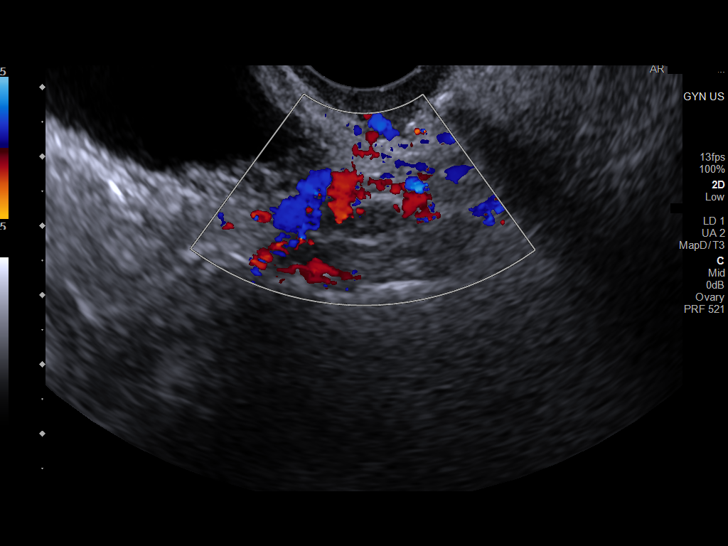

[14 of 25 positions shown; findings below may reference images not displayed]

FINDINGS: Uterus

Surgically absent

Endometrium

N/A

Right ovary

Surgically absent

Left ovary

Measurements: 2.0 x 0.6 x 1.8 cm = volume: 1.2 mL. Normal morphology
without mass

Other findings

No free pelvic fluid.  No adnexal masses.
IMPRESSION: Post hysterectomy and RIGHT oophorectomy.

Unremarkable LEFT ovary.

No pelvic sonographic abnormalities identified.

## 2020-07-29 MED ORDER — DILTIAZEM HCL ER COATED BEADS 180 MG PO CP24: 360.0000 mg | ORAL_CAPSULE | Freq: Every day | ORAL | 3 refills | Status: DC

## 2020-07-29 NOTE — Progress Notes (Signed)
Hey did you get my note about increasing her Cardizem to 360 mg daily and continue until she sees Dr Ladona Ridgel ?  We increased it for a few days then reverted back to previous dose. She is also on Flecainide 100 mg po bid and PRN Cardizem. Just tell her to go back up to Cardizem 360 mg po daily until she is seen by Dr Ladona Ridgel. Try to get a sooner appointment if possible. Have her come back in a week to get EKG if she cannot get an appointment with Dr Ladona Ridgel for a nurse visit to recheck her EKG. Thanks

## 2020-07-29 NOTE — Progress Notes (Signed)
Per Nena Polio, NP-refer to [facility] as well.

## 2020-07-29 NOTE — Addendum Note (Signed)
Addended by: Eustace Moore on: 07/29/2020 10:32 AM   Modules accepted: Orders

## 2020-07-29 NOTE — Progress Notes (Signed)
Presents to office for nurse visit to have EKG per last nurse visit. Reports SOB all the time. Denies chest pain or dizziness. Medications reviewed.

## 2020-07-29 NOTE — Progress Notes (Addendum)
Patient informed and verbalized understanding of plan. Reports having a lot of 180 mg diltiazem and will take 2 of those daily.

## 2020-07-30 NOTE — Progress Notes (Signed)
Primary Care Physician: Gareth Morgan, MD Primary Cardiologist: Dr Diona Browner  Primary Electrophysiologist: Dr Ladona Ridgel Referring Physician: Nena Polio NP   Renee Benitez is a 70 y.o. female with a history of HTN and atrial fibrillation who presents for follow up in the [facility]. Patient is on Eliquis for a CHADS2VASC score of 3. She has been maintained on flecainide and diltiazem but called in with symptoms of ongoing palpitations and SOB with exertion on 07/22/20. She admits that her symptoms have been ongoing for about 2 months. ECG 07/29/20 showed atypical atrial flutter with rapid rates. Her diltiazem was increased. She remains in rapid atrial flutter today.   Today, she denies symptoms of chest pain, shortness of breath, orthopnea, PND, lower extremity edema, dizziness, presyncope, syncope, snoring, daytime somnolence, bleeding, or neurologic sequela. The patient is tolerating medications without difficulties and is otherwise without complaint today.    Atrial Fibrillation Risk Factors:  she does not have symptoms or diagnosis of sleep apnea. she does not have a history of rheumatic fever.   she has a BMI of Body mass index is 31.27 kg/m.Marland Kitchen Filed Weights   07/31/20 1057  Weight: 88.5 kg    Family History  Problem Relation Age of Onset  . Coronary artery disease Father   . Cancer Father        bladder cancer  . Arrhythmia Sister        Reportedly had ablation of SVT  . Hypertension Sister   . Colon cancer Sister 84       s/p surgical resection, no chemo/radiation  . Colon cancer Maternal Grandmother   . Hypertension Son      Atrial Fibrillation Management history:  Previous antiarrhythmic drugs: flecainide  Previous cardioversions: 2018 Previous ablations: none CHADS2VASC score: 3 Anticoagulation history: Eliquis   Past Medical History:  Diagnosis Date  . Atrial fibrillation (HCC)   . Current use of estrogen therapy 01/31/2013   . Dysrhythmia    AFib  . Hypertension   . Macular degeneration   . Menopausal syndrome   . Right ovarian cyst 01/25/2014   Past Surgical History:  Procedure Laterality Date  . ABDOMINAL HYSTERECTOMY    . APPENDECTOMY    . CARDIOVERSION N/A 09/14/2016   Procedure: CARDIOVERSION;  Surgeon: [name], MD;  Location: [facility];  Service: Cardiovascular;  Laterality: N/A;  . CATARACT EXTRACTION W/ INTRAOCULAR LENS  IMPLANT, BILATERAL Bilateral   . COLONOSCOPY  12/04/03   Friable anal canal otherwise normal rectum and colon  . COLONOSCOPY N/A 03/19/2013   Dr. [name]: diverticulosis, hemorrhoids. next TCS 10 years  . COLONOSCOPY N/A 05/02/2019   Procedure: COLONOSCOPY;  Surgeon: [name], MD;  Location: [facility];  Service: Endoscopy;  Laterality: N/A;  7:30am  . ESOPHAGOGASTRODUODENOSCOPY N/A 10/17/2018   Procedure: ESOPHAGOGASTRODUODENOSCOPY (EGD);  Surgeon: [name], MD;  Location: [facility];  Service: Endoscopy;  Laterality: N/A;  3:00pm  . MALONEY DILATION N/A 10/17/2018   Procedure: Elease Hashimoto DILATION;  Surgeon: [name], MD;  Location: [facility];  Service: Endoscopy;  Laterality: N/A;  . TEE WITHOUT CARDIOVERSION N/A 09/14/2016   Procedure: TRANSESOPHAGEAL ECHOCARDIOGRAM (TEE) WITH PROPOFOL;  Surgeon: [name], MD;  Location: [facility];  Service: Cardiovascular;  Laterality: N/A;  . TONSILECTOMY, ADENOIDECTOMY, BILATERAL MYRINGOTOMY AND TUBES      Current Outpatient Medications  Medication Sig Dispense Refill  . acetaminophen (TYLENOL) 500 MG tablet Take 1,000 mg by mouth every  6 (six) hours as needed for moderate pain or headache.    . Calcium Carb-Cholecalciferol (CALCIUM 600 + D PO) Take 1 tablet by mouth daily.    . Cholecalciferol (DIALYVITE VITAMIN D 5000) 125 MCG (5000 UT) capsule Take 5,000 Units by mouth daily.    Marland Kitchen diltiazem (CARDIZEM) 60 MG tablet Take 1 tablet (60 mg total) by mouth every 6 (six) hours as needed. 120 tablet 6  .  diltiazem (CARTIA XT) 180 MG 24 hr capsule Take 2 capsules (360 mg total) by mouth daily. 180 capsule 3  . ELIQUIS 5 MG TABS tablet TAKE ONE TABLET (5MG  TOTAL) BY MOUTH TWOTIMES DAILY 180 tablet 3  . flecainide (TAMBOCOR) 100 MG tablet Take 1 tablet (100 mg total) by mouth 2 (two) times daily. 180 tablet 3  . loratadine (CLARITIN) 10 MG tablet Take 10 mg by mouth daily as needed for allergies.    . Magnesium 250 MG TABS Take 250 mg by mouth at bedtime.     . metoprolol tartrate (LOPRESSOR) 25 MG tablet Take 1 tablet (25 mg total) by mouth 2 (two) times daily. 60 tablet 3  . Multiple Vitamins-Minerals (MULTIVITAMIN WITH MINERALS) tablet Take 1 tablet by mouth daily.    . Multiple Vitamins-Minerals (PRESERVISION AREDS 2) CAPS Take 1 capsule by mouth 2 (two) times daily.    . Omega-3 Fatty Acids (FISH OIL ULTRA) 1400 MG CAPS Take 1,400 mg by mouth daily.    . pantoprazole (PROTONIX) 40 MG tablet TAKE ONE TABLET (40MG  TOTAL) BY MOUTH DAILY 90 tablet 3  . Simethicone (GAS-X PO) Take 1-2 tablets by mouth daily as needed (gas).    . Vibegron (GEMTESA) 75 MG TABS Take 1 daily (Patient not taking: Reported on 07/31/2020) 42 tablet 0   No current facility-administered medications for this encounter.    No Known Allergies  Social History   Socioeconomic History  . Marital status: Married    Spouse name: Not on file  . Number of children: Not on file  . Years of education: Not on file  . Highest education level: Not on file  Occupational History  . Occupation: Nurse    Comment:  Tobacco Use  . Smoking status: Never Smoker  . Smokeless tobacco: Never Used  . Tobacco comment: Never smoked  Vaping Use  . Vaping Use: Never used  Substance and Sexual Activity  . Alcohol use: Yes    Alcohol/week: 1.0 standard drink    Types: 1 Glasses of wine per week    Comment: occ wine  . Drug use: No  . Sexual activity: Yes    Birth control/protection: Surgical    Comment: hyst  Other  Topics Concern  . Not on file  Social History Narrative  . Not on file   Social Determinants of Health   Financial Resource Strain: Low Risk   . Difficulty of Paying Living Expenses: Not hard at all  Food Insecurity: No Food Insecurity  . Worried About 08/02/2020 in the Last Year: Never true  . Ran Out of Food in the Last Year: Never true  Transportation Needs: No Transportation Needs  . Lack of Transportation (Medical): No  . Lack of Transportation (Non-Medical): No  Physical Activity: Insufficiently Active  . Days of Exercise per Week: 1 day  . Minutes of Exercise per Session: 30 min  Stress: Stress Concern Present  . Feeling of Stress : To some extent  Social Connections: Socially Integrated  . Frequency  of Communication with Friends and Family: Three times a week  . Frequency of Social Gatherings with Friends and Family: Twice a week  . Attends Religious Services: More than 4 times per year  . Active Member of Clubs or Organizations: Yes  . Attends Banker Meetings: More than 4 times per year  . Marital Status: Married  Catering manager Violence: Not At Risk  . Fear of Current or Ex-Partner: No  . Emotionally Abused: No  . Physically Abused: No  . Sexually Abused: No     ROS- All systems are reviewed and negative except as per the HPI above.  Physical Exam: Vitals:   07/31/20 1057  BP: (!) 144/100  Pulse: (!) 123  Weight: 88.5 kg  Height: 5' 6.25" (1.683 m)    GEN- The patient is a well appearing obese female, alert and oriented x 3 today.   Head- normocephalic, atraumatic Eyes-  Sclera clear, conjunctiva pink Ears- hearing intact Oropharynx- clear Neck- supple  Lungs- Clear to ausculation bilaterally, normal work of breathing Heart- irregular rate and rhythm, no murmurs, rubs or gallops  GI- soft, NT, ND, + BS Extremities- no clubbing, cyanosis, or edema MS- no significant deformity or atrophy Skin- no rash or lesion Psych- euthymic  mood, full affect Neuro- strength and sensation are intact  Wt Readings from Last 3 Encounters:  07/31/20 88.5 kg  07/16/20 89.4 kg  02/14/20 88.7 kg    EKG today demonstrates  Atypical atrial flutter with variable block Vent. rate 123 BPM PR interval * ms QRS duration 112 ms QT/QTcB 338/483 ms  Echo 01/10/17 demonstrated  Left ventricle: The cavity size was normal. Wall thickness was  increased in a pattern of mild LVH. Systolic function was normal.  The estimated ejection fraction was in the range of 60% to 65%.  Doppler parameters are consistent with abnormal left ventricular  relaxation (grade 1 diastolic dysfunction). Indeterminate filling  pressures.  - Aortic valve: Mildly calcified annulus. Trileaflet. There was  mild regurgitation.  - Mitral valve: Mildly thickened leaflets .  - Left atrium: The atrium was mildly dilated.  - Tricuspid valve: There was mild regurgitation.  Epic records are reviewed at length today  CHA2DS2-VASc Score = 3  The patient's score is based upon: CHF History: No HTN History: Yes Diabetes History: No Stroke History: No Vascular Disease History: No Age Score: 1 Gender Score: 1      ASSESSMENT AND PLAN: 1. Persistent Atrial Fibrillation/atrial flutter The patient's CHA2DS2-VASc score is 3, indicating a 3.2% annual risk of stroke.   She remains in atrial flutter with rapid rates. Will arrange for DCCV. Check bmet/CBC/TSH Start Lopressor 25 mg BID until DCCV for rate control.  Continue flecainide 100 mg BID Continue diltiazem 360 mg daily. Decrease to 180 mg daily day of DCCV.  2. Secondary Hypercoagulable State (ICD10:  D68.69) The patient is at significant risk for stroke/thromboembolism based upon her CHA2DS2-VASc Score of 3.  Continue Apixaban (Eliquis).   3. Obesity Body mass index is 31.27 kg/m. Lifestyle modification was discussed at length including regular exercise and weight reduction.  4. HTN Elevated  today, med changes as above.   Follow up in the AF clinic post DCCV.    Jorja Loa PA-C Afib Clinic [facility] [address] [place], [place] [address] [phone] 07/31/2020 12:33 PM

## 2020-07-31 ENCOUNTER — Encounter (HOSPITAL_COMMUNITY): Payer: Self-pay | Admitting: Physician Assistant

## 2020-07-31 ENCOUNTER — Other Ambulatory Visit: Payer: Self-pay

## 2020-07-31 ENCOUNTER — Ambulatory Visit (HOSPITAL_COMMUNITY)
Admission: RE | Admit: 2020-07-31 | Discharge: 2020-07-31 | Disposition: A | Discharge status: Home / Self Care | Payer: Medicare Other | Source: Ambulatory Visit | Attending: Physician Assistant | Admitting: Physician Assistant

## 2020-07-31 VITALS — BP 144/100 | HR 123 | Ht 66.25 in | Wt 195.2 lb

## 2020-07-31 DIAGNOSIS — I484 Atypical atrial flutter: Secondary | ICD-10-CM

## 2020-07-31 DIAGNOSIS — I10 Essential (primary) hypertension: Secondary | ICD-10-CM | POA: Insufficient documentation

## 2020-07-31 DIAGNOSIS — D6869 Other thrombophilia: Secondary | ICD-10-CM | POA: Diagnosis not present

## 2020-07-31 DIAGNOSIS — I4819 Other persistent atrial fibrillation: Secondary | ICD-10-CM | POA: Insufficient documentation

## 2020-07-31 DIAGNOSIS — Z6831 Body mass index (BMI) 31.0-31.9, adult: Secondary | ICD-10-CM | POA: Diagnosis not present

## 2020-07-31 DIAGNOSIS — Z7901 Long term (current) use of anticoagulants: Secondary | ICD-10-CM | POA: Insufficient documentation

## 2020-07-31 DIAGNOSIS — Z8249 Family history of ischemic heart disease and other diseases of the circulatory system: Secondary | ICD-10-CM | POA: Diagnosis not present

## 2020-07-31 DIAGNOSIS — Z79899 Other long term (current) drug therapy: Secondary | ICD-10-CM | POA: Insufficient documentation

## 2020-07-31 DIAGNOSIS — E669 Obesity, unspecified: Secondary | ICD-10-CM | POA: Insufficient documentation

## 2020-07-31 LAB — CBC
HCT: 46.1 % — ABNORMAL HIGH (ref 36.0–46.0)
Hemoglobin: 14.8 g/dL (ref 12.0–15.0)
MCH: 29.2 pg (ref 26.0–34.0)
MCHC: 32.1 g/dL (ref 30.0–36.0)
MCV: 90.9 fL (ref 80.0–100.0)
Platelets: 318 10*3/uL (ref 150–400)
RBC: 5.07 MIL/uL (ref 3.87–5.11)
RDW: 12.8 % (ref 11.5–15.5)
WBC: 9.3 10*3/uL (ref 4.0–10.5)
nRBC: 0 % (ref 0.0–0.2)

## 2020-07-31 LAB — BASIC METABOLIC PANEL
Anion gap: 7 (ref 5–15)
BUN: 20 mg/dL (ref 8–23)
CO2: 25 mmol/L (ref 22–32)
Calcium: 9.6 mg/dL (ref 8.9–10.3)
Chloride: 106 mmol/L (ref 98–111)
Creatinine, Ser: 0.88 mg/dL (ref 0.44–1.00)
GFR, Estimated: >60 mL/min (ref >60)
Glucose, Bld: 95 mg/dL (ref 70–99)
Potassium: 4.4 mmol/L (ref 3.5–5.1)
Sodium: 138 mmol/L (ref 135–145)

## 2020-07-31 LAB — T4, FREE: Free T4: 0.75 ng/dL (ref 0.61–1.12)

## 2020-07-31 LAB — TSH: TSH: 9.71 u[IU]/mL — ABNORMAL HIGH (ref 0.350–4.500)

## 2020-07-31 MED ORDER — METOPROLOL TARTRATE 25 MG PO TABS: 25.0000 mg | ORAL_TABLET | Freq: Two times a day (BID) | ORAL | 3 refills | Status: DC

## 2020-07-31 NOTE — Patient Instructions (Signed)
Cardioversion scheduled for Tuesday, May 3rd  - Arrive at the [facility] and go to admitting at 830AM  - Do not eat or drink anything after midnight the night prior to your procedure.  - Take all your morning medication (except diabetic medications) with a sip of water prior to arrival.  - You will not be able to drive home after your procedure.  - Do NOT miss any doses of your blood thinner - if you should miss a dose please notify our office immediately.  - If you feel as if you go back into normal rhythm prior to scheduled cardioversion, please notify our office immediately. If your procedure is canceled in the cardioversion suite you will be charged a cancellation fee.    Start metoprolol 25mg  twice a day -- you will stop this day of cardioversion  Day of cardioversion decrease cardizem to 180mg  once a day

## 2020-07-31 NOTE — Addendum Note (Signed)
Encounter addended by: Shona Simpson, RN on: 07/31/2020 1:38 PM  Actions taken: Order list changed

## 2020-08-01 LAB — T3, FREE: T3, Free: 2.8 pg/mL (ref 2.0–4.4)

## 2020-08-07 ENCOUNTER — Other Ambulatory Visit: Payer: Self-pay

## 2020-08-07 ENCOUNTER — Ambulatory Visit (HOSPITAL_COMMUNITY)
Admission: RE | Admit: 2020-08-07 | Discharge: 2020-08-07 | Disposition: A | Discharge status: Home / Self Care | Payer: Medicare Other | Source: Ambulatory Visit | Attending: Physician Assistant | Admitting: Physician Assistant

## 2020-08-07 VITALS — BP 148/90 | HR 71

## 2020-08-07 DIAGNOSIS — I498 Other specified cardiac arrhythmias: Secondary | ICD-10-CM | POA: Diagnosis not present

## 2020-08-07 DIAGNOSIS — Z79899 Other long term (current) drug therapy: Secondary | ICD-10-CM | POA: Insufficient documentation

## 2020-08-07 DIAGNOSIS — I4819 Other persistent atrial fibrillation: Secondary | ICD-10-CM

## 2020-08-07 DIAGNOSIS — Z7901 Long term (current) use of anticoagulants: Secondary | ICD-10-CM | POA: Diagnosis not present

## 2020-08-07 MED ORDER — METOPROLOL TARTRATE 25 MG PO TABS: ORAL_TABLET | ORAL | 3 refills | Status: DC

## 2020-08-07 MED ORDER — DILTIAZEM HCL ER COATED BEADS 180 MG PO CP24: 180.0000 mg | ORAL_CAPSULE | Freq: Every day | ORAL | 3 refills | Status: DC

## 2020-08-07 NOTE — Progress Notes (Signed)
Patient called clinic feeling she was back in SR on 4/26. ECG today confirms SR HR 71, PR 198, QRS 90, QTc 449. Will cancel DCCV. Continue diltiazem 180 mg daily and change Lopressor to 25 mg PRN for heart racing. F/u in the AF clinic in 3 months.

## 2020-08-08 ENCOUNTER — Other Ambulatory Visit (HOSPITAL_COMMUNITY): Payer: 59

## 2020-08-12 ENCOUNTER — Encounter (HOSPITAL_COMMUNITY): Admission: RE | Payer: Self-pay | Source: Home / Self Care

## 2020-08-12 ENCOUNTER — Ambulatory Visit (HOSPITAL_COMMUNITY): Admission: RE | Admit: 2020-08-12 | Payer: Medicare Other | Source: Home / Self Care | Admitting: Internal Medicine

## 2020-08-12 SURGERY — CARDIOVERSION
Anesthesia: General

## 2020-08-19 ENCOUNTER — Ambulatory Visit (HOSPITAL_COMMUNITY): Payer: 59 | Admitting: Physician Assistant

## 2020-09-26 ENCOUNTER — Other Ambulatory Visit (HOSPITAL_COMMUNITY): Payer: Self-pay | Admitting: Physician Assistant

## 2020-10-01 ENCOUNTER — Other Ambulatory Visit: Payer: Self-pay

## 2020-10-01 ENCOUNTER — Ambulatory Visit (HOSPITAL_COMMUNITY)
Admission: RE | Admit: 2020-10-01 | Discharge: 2020-10-01 | Disposition: A | Discharge status: Home / Self Care | Payer: Medicare Other | Source: Ambulatory Visit | Attending: Adult Health | Admitting: Adult Health

## 2020-10-01 DIAGNOSIS — Z1231 Encounter for screening mammogram for malignant neoplasm of breast: Secondary | ICD-10-CM

## 2020-10-01 IMAGING — MG MM DIGITAL SCREENING BILAT W/ TOMO AND CAD
8 series · 8 of 24 positions shown · non-contrast
Comparison: Previous exam(s).

CLINICAL DATA: Screening.

EXAM:
DIGITAL SCREENING BILATERAL MAMMOGRAM WITH TOMOSYNTHESIS AND CAD
TECHNIQUE: Bilateral screening digital craniocaudal and mediolateral oblique
mammograms were obtained. Bilateral screening digital breast
tomosynthesis was performed. The images were evaluated with
computer-aided detection.

[R MLO synth-2D]
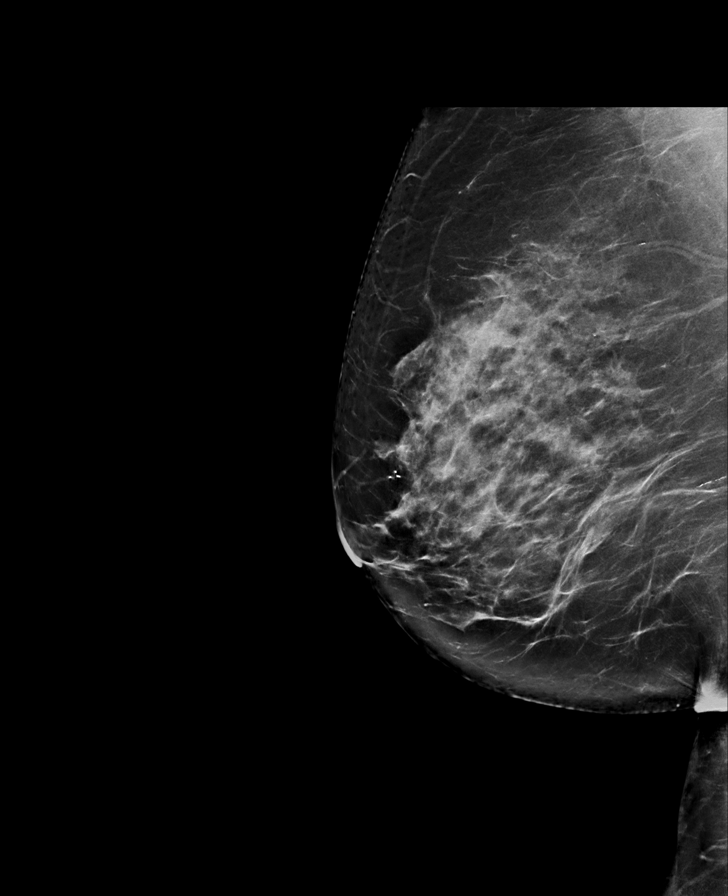

[L CC synth-2D]
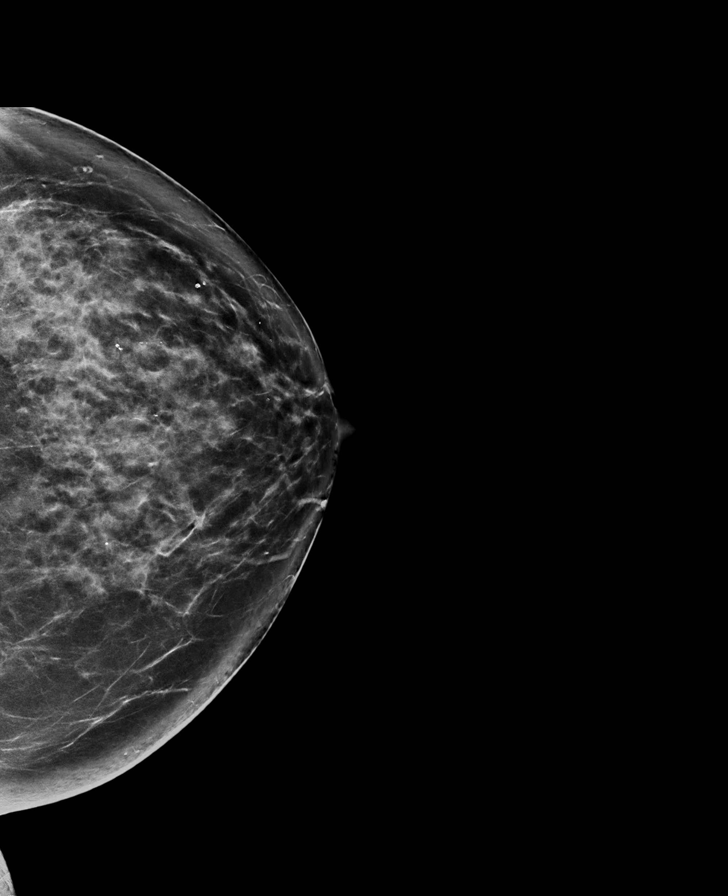

[L MLO synth-2D]
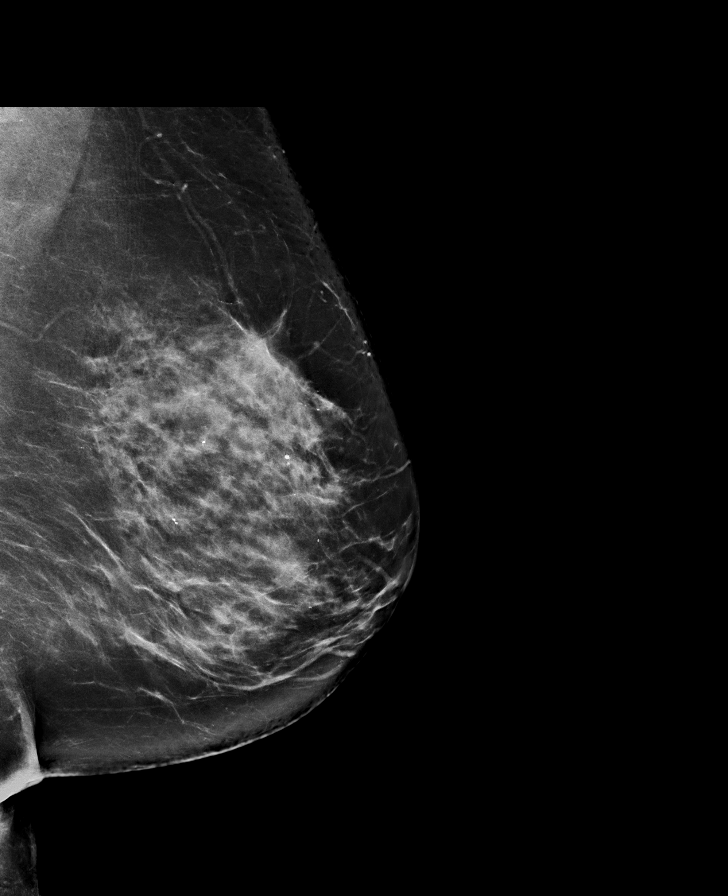

[R CC synth-2D]
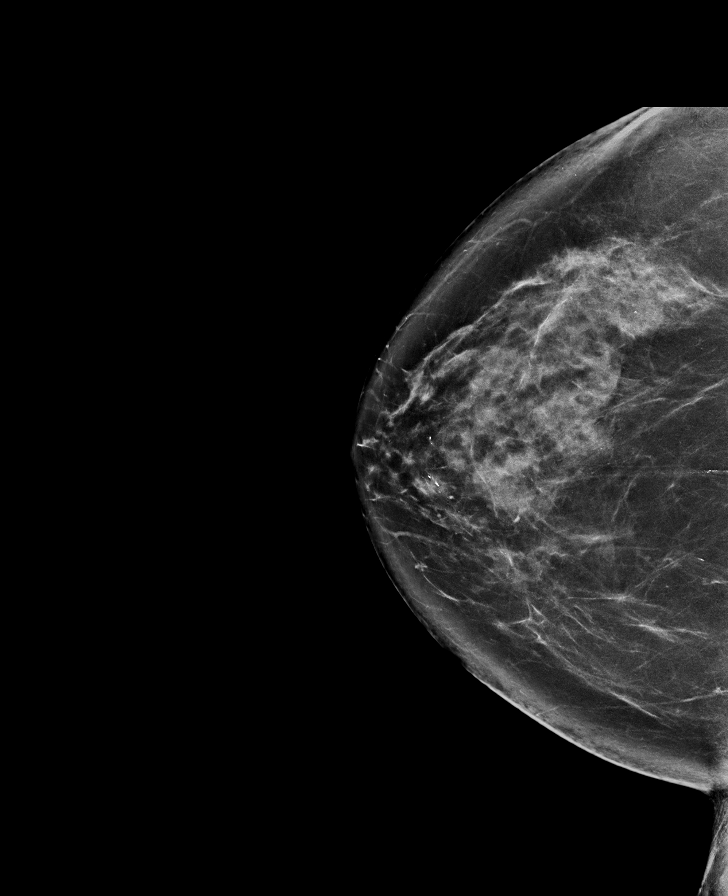

[R MLO tomo · tomo slice 47/94.0]
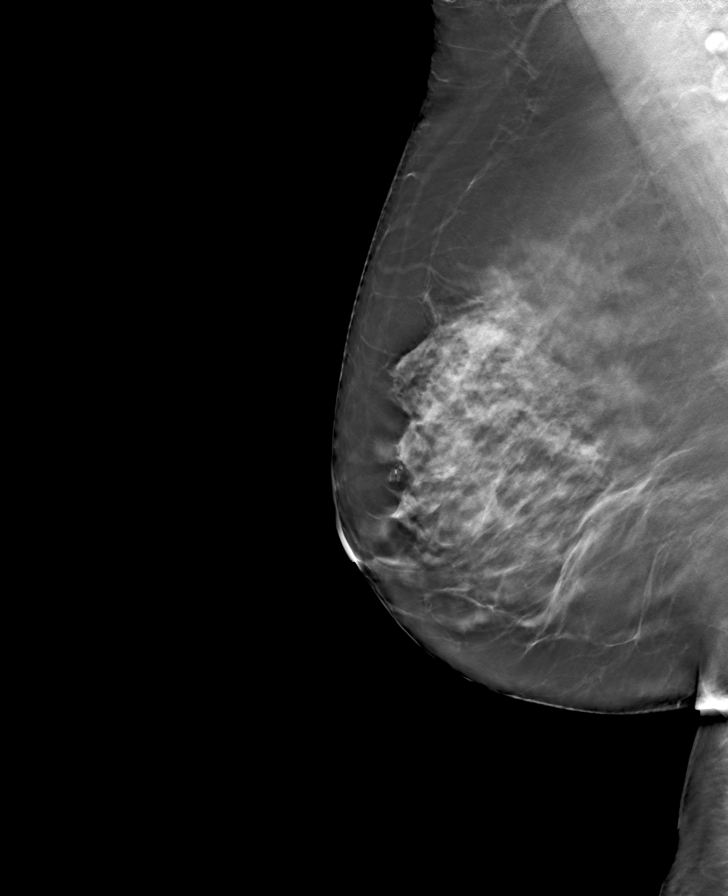

[L CC tomo · tomo slice 45/89.0]
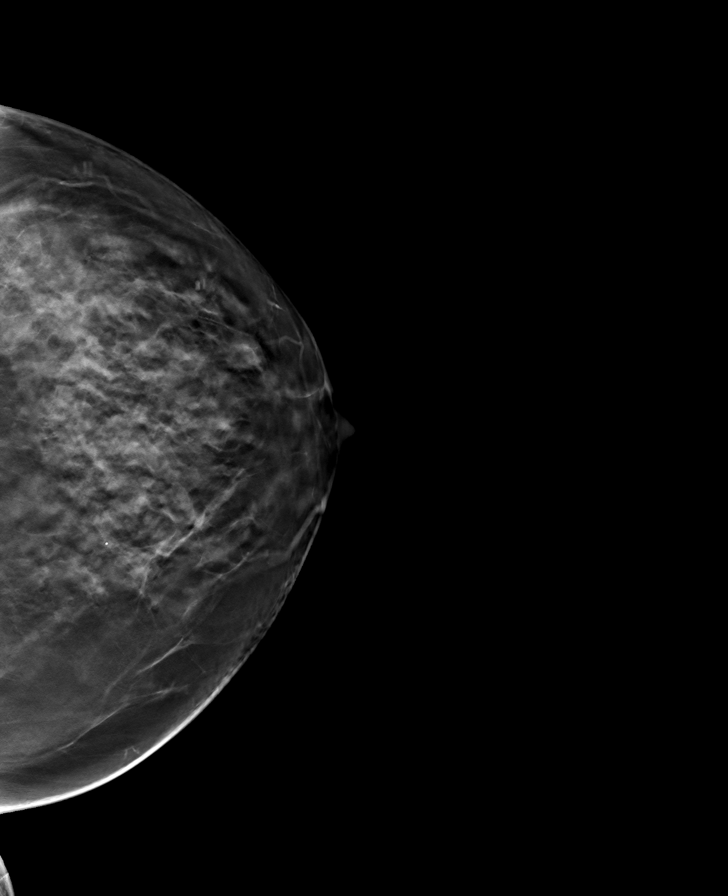

[L MLO tomo · tomo slice 50/99.0]
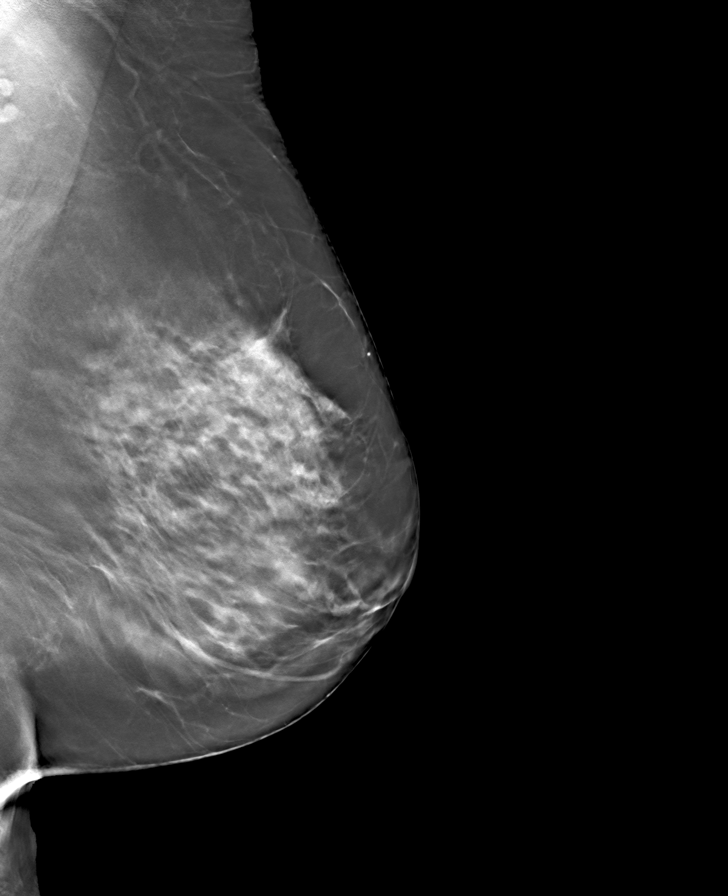

[R CC tomo · tomo slice 47/93.0]
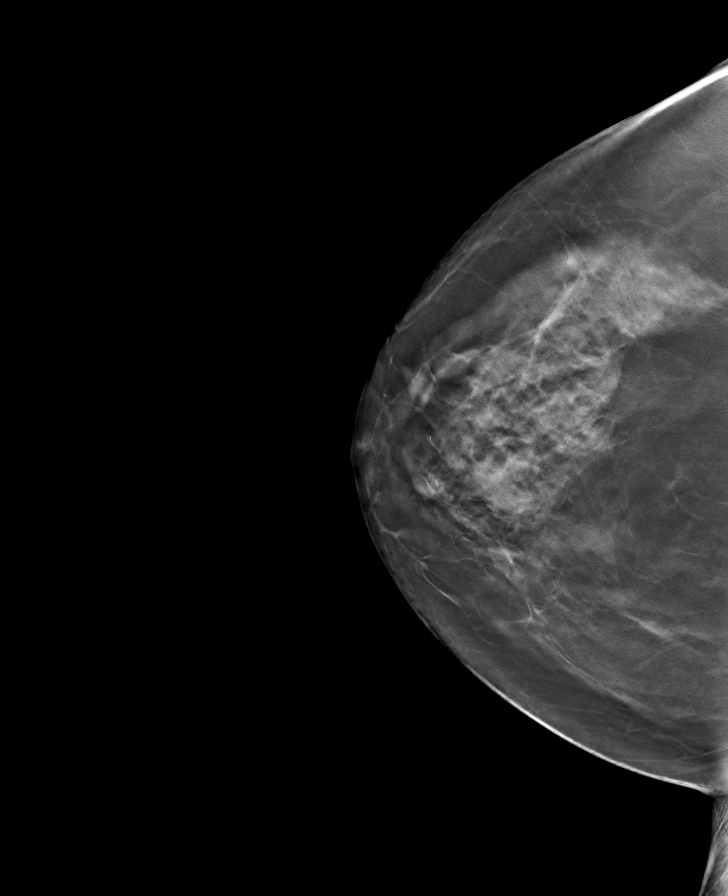

[8 of 24 positions shown; findings below may reference images not displayed]

ACR Breast Density Category c: The breast tissue is heterogeneously
dense, which may obscure small masses.
FINDINGS: There are no findings suspicious for malignancy.
IMPRESSION: No mammographic evidence of malignancy. A result letter of this
screening mammogram will be mailed directly to the patient.

RECOMMENDATION:
Screening mammogram in one year. (Code:[V2])

BI-RADS CATEGORY  1: Negative.

## 2020-11-13 ENCOUNTER — Ambulatory Visit (HOSPITAL_COMMUNITY): Admission: RE | Admit: 2020-11-13 | Payer: Medicare Other | Source: Ambulatory Visit | Admitting: Physician Assistant

## 2020-12-01 ENCOUNTER — Ambulatory Visit (HOSPITAL_COMMUNITY)
Admission: RE | Admit: 2020-12-01 | Discharge: 2020-12-01 | Disposition: A | Discharge status: Home / Self Care | Payer: Medicare Other | Source: Ambulatory Visit | Attending: Physician Assistant | Admitting: Physician Assistant

## 2020-12-01 ENCOUNTER — Other Ambulatory Visit: Payer: Self-pay

## 2020-12-01 VITALS — BP 160/76 | HR 39 | Ht 66.25 in | Wt 197.8 lb

## 2020-12-01 DIAGNOSIS — D6869 Other thrombophilia: Secondary | ICD-10-CM | POA: Insufficient documentation

## 2020-12-01 DIAGNOSIS — Z7901 Long term (current) use of anticoagulants: Secondary | ICD-10-CM | POA: Diagnosis not present

## 2020-12-01 DIAGNOSIS — E669 Obesity, unspecified: Secondary | ICD-10-CM | POA: Diagnosis not present

## 2020-12-01 DIAGNOSIS — Z79899 Other long term (current) drug therapy: Secondary | ICD-10-CM | POA: Diagnosis not present

## 2020-12-01 DIAGNOSIS — I10 Essential (primary) hypertension: Secondary | ICD-10-CM | POA: Insufficient documentation

## 2020-12-01 DIAGNOSIS — Z8249 Family history of ischemic heart disease and other diseases of the circulatory system: Secondary | ICD-10-CM | POA: Diagnosis not present

## 2020-12-01 DIAGNOSIS — Z6831 Body mass index (BMI) 31.0-31.9, adult: Secondary | ICD-10-CM | POA: Insufficient documentation

## 2020-12-01 DIAGNOSIS — I4819 Other persistent atrial fibrillation: Secondary | ICD-10-CM | POA: Diagnosis not present

## 2020-12-01 DIAGNOSIS — I4892 Unspecified atrial flutter: Secondary | ICD-10-CM | POA: Insufficient documentation

## 2020-12-01 NOTE — Progress Notes (Signed)
Primary Care Physician: Gareth Morgan, MD Primary Cardiologist: Dr Diona Browner  Primary Electrophysiologist: Dr Ladona Ridgel Referring Physician: Nena Polio NP   Renee Benitez is a 70 y.o. female with a history of HTN and atrial fibrillation who presents for follow up in the [facility]. Patient is on Eliquis for a CHADS2VASC score of 3. She has been maintained on flecainide and diltiazem but called in with symptoms of ongoing palpitations and SOB with exertion on 07/22/20. She admits that her symptoms had been ongoing for about 2 months. ECG 07/29/20 showed atypical atrial flutter with rapid rates. Her diltiazem was increased. She was also started on metoprolol which converted her to SR.  On follow up today, patient reports that she feels well. She remains very active biking and kayaking. She denies excessive fatigue, dizziness, or presyncope. She reports her heart rate at home is upper 40s-70s.   Today, she denies symptoms of palpitations, chest pain, shortness of breath, orthopnea, PND, lower extremity edema, dizziness, presyncope, syncope, snoring, daytime somnolence, bleeding, or neurologic sequela. The patient is tolerating medications without difficulties and is otherwise without complaint today.    Atrial Fibrillation Risk Factors:  she does not have symptoms or diagnosis of sleep apnea. she does not have a history of rheumatic fever.   she has a BMI of Body mass index is 31.69 kg/m.Marland Kitchen Filed Weights   12/01/20 1005  Weight: 89.7 kg     Family History  Problem Relation Age of Onset   Coronary artery disease Father    Cancer Father        bladder cancer   Arrhythmia Sister        Reportedly had ablation of SVT   Hypertension Sister    Colon cancer Sister 110       s/p surgical resection, no chemo/radiation   Colon cancer Maternal Grandmother    Hypertension Son      Atrial Fibrillation Management history:  Previous antiarrhythmic drugs:  flecainide  Previous cardioversions: 2018 Previous ablations: none CHADS2VASC score: 3 Anticoagulation history: Eliquis   Past Medical History:  Diagnosis Date   Atrial fibrillation (HCC)    Current use of estrogen therapy 01/31/2013   Dysrhythmia    AFib   Hypertension    Macular degeneration    Menopausal syndrome    Right ovarian cyst 01/25/2014   Past Surgical History:  Procedure Laterality Date   ABDOMINAL HYSTERECTOMY     APPENDECTOMY     CARDIOVERSION N/A 09/14/2016   Procedure: CARDIOVERSION;  Surgeon: [name], MD;  Location: [facility];  Service: Cardiovascular;  Laterality: N/A;   CATARACT EXTRACTION W/ INTRAOCULAR LENS  IMPLANT, BILATERAL Bilateral    COLONOSCOPY  12/04/03   Friable anal canal otherwise normal rectum and colon   COLONOSCOPY N/A 03/19/2013   Dr. [name]: diverticulosis, hemorrhoids. next TCS 10 years   COLONOSCOPY N/A 05/02/2019   Procedure: COLONOSCOPY;  Surgeon: [name], MD;  Location: [facility];  Service: Endoscopy;  Laterality: N/A;  7:30am   ESOPHAGOGASTRODUODENOSCOPY N/A 10/17/2018   Procedure: ESOPHAGOGASTRODUODENOSCOPY (EGD);  Surgeon: [name], MD;  Location: [facility];  Service: Endoscopy;  Laterality: N/A;  3:00pm   MALONEY DILATION N/A 10/17/2018   Procedure: Elease Hashimoto DILATION;  Surgeon: [name], MD;  Location: [facility];  Service: Endoscopy;  Laterality: N/A;   TEE WITHOUT CARDIOVERSION N/A 09/14/2016   Procedure: TRANSESOPHAGEAL ECHOCARDIOGRAM (TEE) WITH PROPOFOL;  Surgeon: [name], MD;  Location: [facility];  Service: Cardiovascular;  Laterality: N/A;   TONSILECTOMY, ADENOIDECTOMY, BILATERAL MYRINGOTOMY AND TUBES      Current Outpatient Medications  Medication Sig Dispense Refill   acetaminophen (TYLENOL) 500 MG tablet Take 1,000 mg by mouth every 6 (six) hours as needed for moderate pain or headache.     Calcium Carb-Cholecalciferol (CALCIUM 600 + D PO) Take 1 tablet by mouth daily.      Cholecalciferol (DIALYVITE VITAMIN D 5000) 125 MCG (5000 UT) capsule Take 5,000 Units by mouth daily.     diltiazem (CARDIZEM) 60 MG tablet Take one tablet every 6 hours as needed for A-fib episodes     diltiazem (CARTIA XT) 180 MG 24 hr capsule Take 1 capsule (180 mg total) by mouth daily. 180 capsule 3   ELIQUIS 5 MG TABS tablet TAKE ONE TABLET (5MG  TOTAL) BY MOUTH TWOTIMES DAILY 180 tablet 3   flecainide (TAMBOCOR) 100 MG tablet Take 1 tablet (100 mg total) by mouth 2 (two) times daily. 180 tablet 3   levothyroxine (SYNTHROID) 50 MCG tablet Take 50 mcg by mouth daily.     loratadine (CLARITIN) 10 MG tablet Take 10 mg by mouth daily as needed for allergies.     Magnesium 250 MG TABS Take 250 mg by mouth at bedtime.      metoprolol tartrate (LOPRESSOR) 25 MG tablet Take 1 tablet by mouth every 8 hours as needed for breakthrough afib 60 tablet 3   Multiple Vitamins-Minerals (MULTIVITAMIN WITH MINERALS) tablet Take 1 tablet by mouth daily.     Multiple Vitamins-Minerals (PRESERVISION AREDS 2) CAPS Take 1 capsule by mouth 2 (two) times daily.     Omega-3 Fatty Acids (FISH OIL ULTRA) 1400 MG CAPS Take 1,400 mg by mouth daily.     pantoprazole (PROTONIX) 40 MG tablet TAKE ONE TABLET (40MG  TOTAL) BY MOUTH DAILY 90 tablet 3   Simethicone (GAS-X PO) Take 1-2 tablets by mouth daily as needed (gas).     No current facility-administered medications for this encounter.    No Known Allergies  Social History   Socioeconomic History   Marital status: Married    Spouse name: Not on file   Number of children: Not on file   Years of education: Not on file   Highest education level: Not on file  Occupational History   Occupation: Nurse    Comment: [employer]  Tobacco Use   Smoking status: Never   Smokeless tobacco: Never   Tobacco comments:    Never smoked  Vaping Use   Vaping Use: Never used  Substance and Sexual Activity   Alcohol use: Yes    Alcohol/week: 1.0 standard drink    Types:  1 Glasses of wine per week    Comment: occ wine   Drug use: No   Sexual activity: Yes    Birth control/protection: Surgical    Comment: hyst  Other Topics Concern   Not on file  Social History Narrative   Not on file   Social Determinants of Health   Financial Resource Strain: Low Risk    Difficulty of Paying Living Expenses: Not hard at all  Food Insecurity: No Food Insecurity   Worried About Programme researcher, broadcasting/film/video in the Last Year: Never true   Ran Out of Food in the Last Year: Never true  Transportation Needs: No Transportation Needs   Lack of Transportation (Medical): No   Lack of Transportation (Non-Medical): No  Physical Activity: Insufficiently Active   Days of Exercise  per Week: 1 day   Minutes of Exercise per Session: 30 min  Stress: Stress Concern Present   Feeling of Stress : To some extent  Social Connections: Socially Integrated   Frequency of Communication with Friends and Family: Three times a week   Frequency of Social Gatherings with Friends and Family: Twice a week   Attends Religious Services: More than 4 times per year   Active Member of [employer] or Organizations: Yes   Attends Engineer, structural: More than 4 times per year   Marital Status: Married  Catering manager Violence: Not At Risk   Fear of Current or Ex-Partner: No   Emotionally Abused: No   Physically Abused: No   Sexually Abused: No     ROS- All systems are reviewed and negative except as per the HPI above.  Physical Exam: Vitals:   12/01/20 1005  BP: (!) 160/76  Pulse: (!) 39  Weight: 89.7 kg  Height: 5' 6.25" (1.683 m)    GEN- The patient is a well appearing obese female, alert and oriented x 3 today.   HEENT-head normocephalic, atraumatic, sclera clear, conjunctiva pink, hearing intact, trachea midline. Lungs- Clear to ausculation bilaterally, normal work of breathing Heart- Regular rate and rhythm, bradycardia, no murmurs, rubs or gallops  GI- soft, NT, ND, +  BS Extremities- no clubbing, cyanosis, or edema MS- no significant deformity or atrophy Skin- no rash or lesion Psych- euthymic mood, full affect Neuro- strength and sensation are intact   Wt Readings from Last 3 Encounters:  12/01/20 89.7 kg  07/31/20 88.5 kg  07/16/20 89.4 kg    EKG today demonstrates  SB Vent. rate 39 BPM PR interval 192 ms QRS duration 96 ms QT/QTcB 450/362 ms  Echo 01/10/17 demonstrated  Left ventricle: The cavity size was normal. Wall thickness was    increased in a pattern of mild LVH. Systolic function was normal.    The estimated ejection fraction was in the range of 60% to 65%.    Doppler parameters are consistent with abnormal left ventricular    relaxation (grade 1 diastolic dysfunction). Indeterminate filling    pressures.  - Aortic valve: Mildly calcified annulus. Trileaflet. There was    mild regurgitation.  - Mitral valve: Mildly thickened leaflets .  - Left atrium: The atrium was mildly dilated.  - Tricuspid valve: There was mild regurgitation.  Epic records are reviewed at length today  CHA2DS2-VASc Score = 3  The patient's score is based upon: CHF History: No HTN History: Yes Diabetes History: No Stroke History: No Vascular Disease History: No Age Score: 1 Gender Score: 1      ASSESSMENT AND PLAN: 1. Persistent Atrial Fibrillation/atrial flutter The patient's CHA2DS2-VASc score is 3, indicating a 3.2% annual risk of stroke.   Patient appears to be maintaining SR. Continue Lopressor 25 mg PRN q 6 hours for heart racing. Continue flecainide 100 mg BID Continue diltiazem 180 mg daily for now. Patient hesitant to decrease rate control given how good she feels. Will have her monitor heart rate and BP and call clinic in 2 weeks. Walking in office today her heart rate rose appropriately to 60-70.   2. Secondary Hypercoagulable State (ICD10:  D68.69) The patient is at significant risk for stroke/thromboembolism based upon her  CHA2DS2-VASc Score of 3.  Continue Apixaban (Eliquis).   3. Obesity Body mass index is 31.69 kg/m. Lifestyle modification was discussed and encouraged including regular physical activity and weight reduction.  4. HTN Stable, if  diltiazem is decreased, may need to add additional antihypertensive.    Follow up in the AF clinic in 6 months.    Jorja Loa PA-C Afib Clinic [facility] [address] [place], [place] [address] [phone] 12/01/2020 12:04 PM

## 2020-12-01 NOTE — Patient Instructions (Signed)
Call in 2 weeks with update of heart rates/blood pressures -- [phone]

## 2021-01-05 ENCOUNTER — Emergency Department (HOSPITAL_COMMUNITY): Payer: Medicare Other

## 2021-01-05 ENCOUNTER — Encounter (HOSPITAL_COMMUNITY): Payer: Self-pay | Admitting: Emergency Medicine

## 2021-01-05 ENCOUNTER — Inpatient Hospital Stay (HOSPITAL_COMMUNITY)
Admission: EM | Admit: 2021-01-05 | Discharge: 2021-01-07 | DRG: 024 | Disposition: A | Discharge status: Home / Self Care | Payer: Medicare Other | Attending: Neurology | Admitting: Neurology

## 2021-01-05 ENCOUNTER — Inpatient Hospital Stay (HOSPITAL_COMMUNITY): Payer: Medicare Other

## 2021-01-05 ENCOUNTER — Encounter (HOSPITAL_COMMUNITY)
Admission: EM | Disposition: A | Discharge status: Home / Self Care | Payer: Self-pay | Source: Home / Self Care | Attending: Neurology

## 2021-01-05 ENCOUNTER — Emergency Department (HOSPITAL_COMMUNITY): Payer: Medicare Other | Admitting: Anesthesiology

## 2021-01-05 ENCOUNTER — Other Ambulatory Visit: Payer: Self-pay

## 2021-01-05 DIAGNOSIS — R4781 Slurred speech: Secondary | ICD-10-CM | POA: Diagnosis present

## 2021-01-05 DIAGNOSIS — H02401 Unspecified ptosis of right eyelid: Secondary | ICD-10-CM | POA: Diagnosis present

## 2021-01-05 DIAGNOSIS — R29715 NIHSS score 15: Secondary | ICD-10-CM | POA: Diagnosis present

## 2021-01-05 DIAGNOSIS — I10 Essential (primary) hypertension: Secondary | ICD-10-CM | POA: Diagnosis present

## 2021-01-05 DIAGNOSIS — R471 Dysarthria and anarthria: Secondary | ICD-10-CM | POA: Diagnosis present

## 2021-01-05 DIAGNOSIS — E785 Hyperlipidemia, unspecified: Secondary | ICD-10-CM | POA: Diagnosis present

## 2021-01-05 DIAGNOSIS — Z90710 Acquired absence of both cervix and uterus: Secondary | ICD-10-CM | POA: Diagnosis not present

## 2021-01-05 DIAGNOSIS — Z8673 Personal history of transient ischemic attack (TIA), and cerebral infarction without residual deficits: Secondary | ICD-10-CM | POA: Diagnosis not present

## 2021-01-05 DIAGNOSIS — Z6833 Body mass index (BMI) 33.0-33.9, adult: Secondary | ICD-10-CM | POA: Diagnosis not present

## 2021-01-05 DIAGNOSIS — G8194 Hemiplegia, unspecified affecting left nondominant side: Secondary | ICD-10-CM | POA: Diagnosis present

## 2021-01-05 DIAGNOSIS — R29810 Facial weakness: Secondary | ICD-10-CM | POA: Diagnosis present

## 2021-01-05 DIAGNOSIS — I6389 Other cerebral infarction: Secondary | ICD-10-CM | POA: Diagnosis not present

## 2021-01-05 DIAGNOSIS — Z7901 Long term (current) use of anticoagulants: Secondary | ICD-10-CM | POA: Diagnosis not present

## 2021-01-05 DIAGNOSIS — Z79899 Other long term (current) drug therapy: Secondary | ICD-10-CM

## 2021-01-05 DIAGNOSIS — I63531 Cerebral infarction due to unspecified occlusion or stenosis of right posterior cerebral artery: Secondary | ICD-10-CM

## 2021-01-05 DIAGNOSIS — I480 Paroxysmal atrial fibrillation: Secondary | ICD-10-CM | POA: Diagnosis present

## 2021-01-05 DIAGNOSIS — Z79890 Hormone replacement therapy (postmenopausal): Secondary | ICD-10-CM

## 2021-01-05 DIAGNOSIS — D6869 Other thrombophilia: Secondary | ICD-10-CM | POA: Diagnosis present

## 2021-01-05 DIAGNOSIS — E669 Obesity, unspecified: Secondary | ICD-10-CM | POA: Diagnosis present

## 2021-01-05 DIAGNOSIS — I639 Cerebral infarction, unspecified: Secondary | ICD-10-CM | POA: Diagnosis not present

## 2021-01-05 DIAGNOSIS — E119 Type 2 diabetes mellitus without complications: Secondary | ICD-10-CM | POA: Diagnosis present

## 2021-01-05 DIAGNOSIS — Z20822 Contact with and (suspected) exposure to covid-19: Secondary | ICD-10-CM | POA: Diagnosis present

## 2021-01-05 DIAGNOSIS — I63431 Cerebral infarction due to embolism of right posterior cerebral artery: Secondary | ICD-10-CM

## 2021-01-05 DIAGNOSIS — Z8249 Family history of ischemic heart disease and other diseases of the circulatory system: Secondary | ICD-10-CM | POA: Diagnosis not present

## 2021-01-05 HISTORY — PX: IR CT HEAD LTD: IMG2386

## 2021-01-05 HISTORY — PX: IR US GUIDE VASC ACCESS LEFT: IMG2389

## 2021-01-05 HISTORY — PX: RADIOLOGY WITH ANESTHESIA: SHX6223

## 2021-01-05 HISTORY — PX: IR PERCUTANEOUS ART THROMBECTOMY/INFUSION INTRACRANIAL INC DIAG ANGIO: IMG6087

## 2021-01-05 HISTORY — PX: IR INTRA CRAN STENT: IMG2345

## 2021-01-05 LAB — DIFFERENTIAL
Abs Immature Granulocytes: 0.02 10*3/uL (ref 0.00–0.07)
Basophils Absolute: 0.1 10*3/uL (ref 0.0–0.1)
Basophils Relative: 2 %
Eosinophils Absolute: 0.1 10*3/uL (ref 0.0–0.5)
Eosinophils Relative: 2 %
Immature Granulocytes: 0 %
Lymphocytes Relative: 43 %
Lymphs Abs: 2.7 10*3/uL (ref 0.7–4.0)
Monocytes Absolute: 0.5 10*3/uL (ref 0.1–1.0)
Monocytes Relative: 7 %
Neutro Abs: 2.8 10*3/uL (ref 1.7–7.7)
Neutrophils Relative %: 46 %

## 2021-01-05 LAB — COMPREHENSIVE METABOLIC PANEL
ALT: 34 U/L (ref 0–44)
AST: 29 U/L (ref 15–41)
Albumin: 3.9 g/dL (ref 3.5–5.0)
Alkaline Phosphatase: 73 U/L (ref 38–126)
Anion gap: 7 (ref 5–15)
BUN: 17 mg/dL (ref 8–23)
CO2: 25 mmol/L (ref 22–32)
Calcium: 9.2 mg/dL (ref 8.9–10.3)
Chloride: 105 mmol/L (ref 98–111)
Creatinine, Ser: 0.85 mg/dL (ref 0.44–1.00)
GFR, Estimated: >60 mL/min (ref >60)
Glucose, Bld: 89 mg/dL (ref 70–99)
Potassium: 4.1 mmol/L (ref 3.5–5.1)
Sodium: 137 mmol/L (ref 135–145)
Total Bilirubin: 0.6 mg/dL (ref 0.3–1.2)
Total Protein: 6.9 g/dL (ref 6.5–8.1)

## 2021-01-05 LAB — PROTIME-INR
INR: 1.2 (ref 0.8–1.2)
Prothrombin Time: 15 seconds (ref 11.4–15.2)

## 2021-01-05 LAB — I-STAT CHEM 8, ED
BUN: 16 mg/dL (ref 8–23)
Calcium, Ion: 1.2 mmol/L (ref 1.15–1.40)
Chloride: 106 mmol/L (ref 98–111)
Creatinine, Ser: 0.9 mg/dL (ref 0.44–1.00)
Glucose, Bld: 92 mg/dL (ref 70–99)
HCT: 41 % (ref 36.0–46.0)
Hemoglobin: 13.9 g/dL (ref 12.0–15.0)
Potassium: 4.4 mmol/L (ref 3.5–5.1)
Sodium: 142 mmol/L (ref 135–145)
TCO2: 27 mmol/L (ref 22–32)

## 2021-01-05 LAB — GLUCOSE, CAPILLARY
Glucose-Capillary: 134 mg/dL — ABNORMAL HIGH (ref 70–99)
Glucose-Capillary: 129 mg/dL — ABNORMAL HIGH (ref 70–99)

## 2021-01-05 LAB — CBG MONITORING, ED: Glucose-Capillary: 83 mg/dL (ref 70–99)

## 2021-01-05 LAB — CBC
HCT: 42 % (ref 36.0–46.0)
Hemoglobin: 14.1 g/dL (ref 12.0–15.0)
MCH: 31.1 pg (ref 26.0–34.0)
MCHC: 33.6 g/dL (ref 30.0–36.0)
MCV: 92.7 fL (ref 80.0–100.0)
Platelets: 226 10*3/uL (ref 150–400)
RBC: 4.53 MIL/uL (ref 3.87–5.11)
RDW: 12.8 % (ref 11.5–15.5)
WBC: 6.1 10*3/uL (ref 4.0–10.5)
nRBC: 0 % (ref 0.0–0.2)

## 2021-01-05 LAB — RESP PANEL BY RT-PCR (FLU A&B, COVID) ARPGX2
Influenza A by PCR: NEGATIVE
Influenza B by PCR: NEGATIVE
SARS Coronavirus 2 by RT PCR: NEGATIVE

## 2021-01-05 LAB — APTT: aPTT: 29 seconds (ref 24–36)

## 2021-01-05 LAB — MRSA NEXT GEN BY PCR, NASAL: MRSA by PCR Next Gen: NOT DETECTED

## 2021-01-05 IMAGING — MR MR HEAD W/O CM
12 of 13 series · 44 of 48 positions shown · non-contrast
Comparison: None.

CLINICAL DATA: Left-sided weakness, status post endovascular
intervention.

EXAM:
MRI HEAD WITHOUT CONTRAST
TECHNIQUE: Multiplanar, multiecho pulse sequences of the brain and surrounding
structures were obtained without intravenous contrast.

[Series 5: DWI · axial · 3.0mm · 0.92mm/px · z∈[-103,+56]mm · 7 of 108 slices shown (1 of 4)]
[im 1/108]
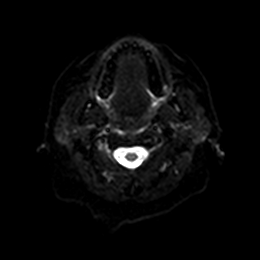
[im 18/108]
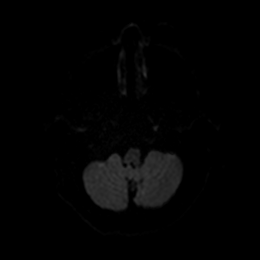
[im 36/108]
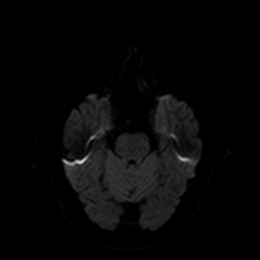
[im 54/108]
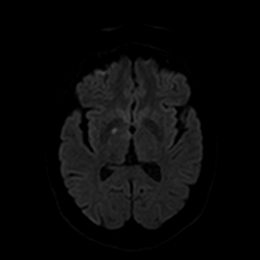
[im 72/108]
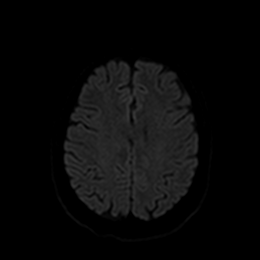
[im 90/108]
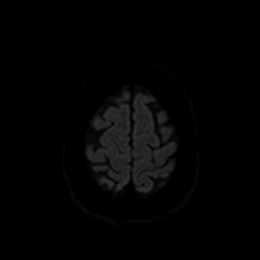
[im 108/108]
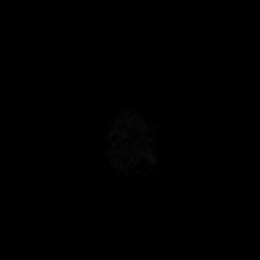

[Series 6: DWI · axial · 3.0mm · 0.92mm/px · z∈[-103,+56]mm · 4 of 54 slices shown (2 of 4)]
[im 1/54]
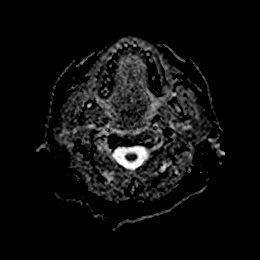
[im 18/54]
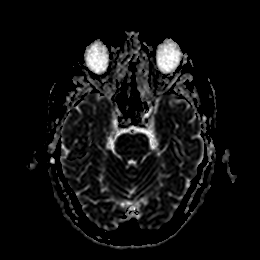
[im 36/54]
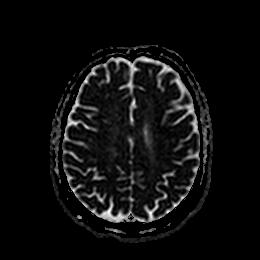
[im 54/54]
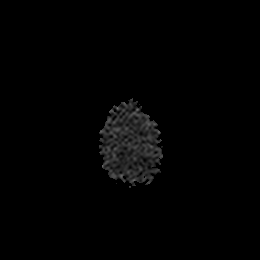

[Series 7: DWI · coronal · 4.0mm · 0.88mm/px · 6 of 80 slices shown (3 of 4)]
[im 1/80]
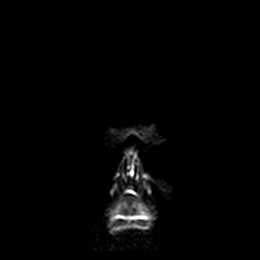
[im 16/80]
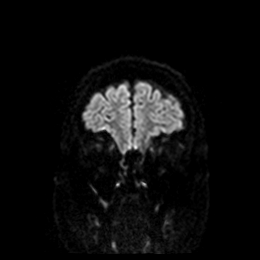
[im 32/80]
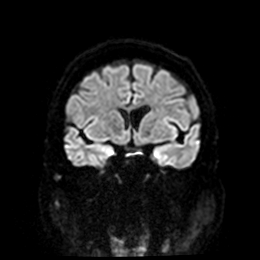
[im 48/80]
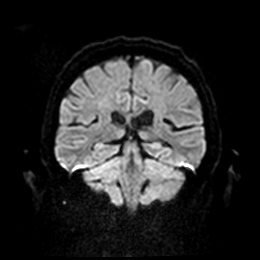
[im 64/80]
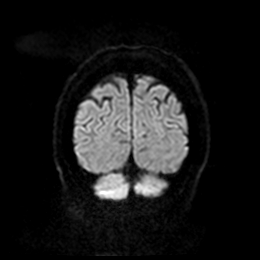
[im 80/80]
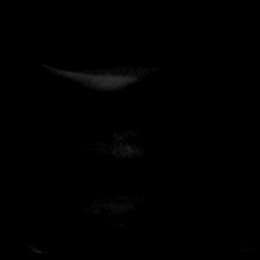

[Series 8: DWI · coronal · 4.0mm · 0.88mm/px · 3 of 40 slices shown (4 of 4)]
[im 1/40]
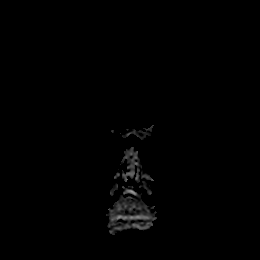
[im 20/40]
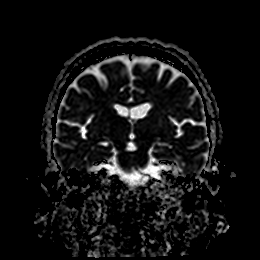
[im 40/40]
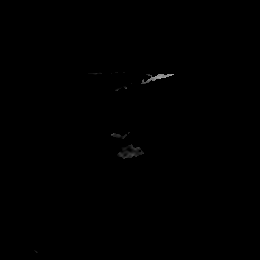

[Series 9: T1 · sagittal · 5.0mm · 0.75mm/px · 2 of 28 slices shown]
[im 1/28]
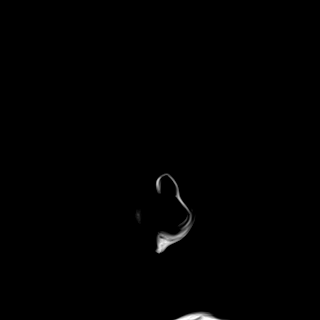
[im 28/28]
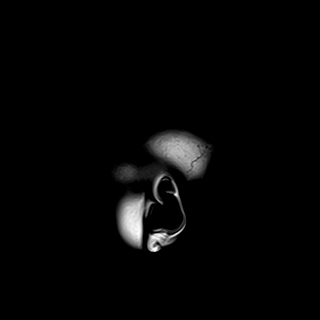

[Series 10: T2 · axial · 5.0mm · 0.75mm/px · z∈[-104,+57]mm · 2 of 28 slices shown (1 of 2)]
[im 1/28]
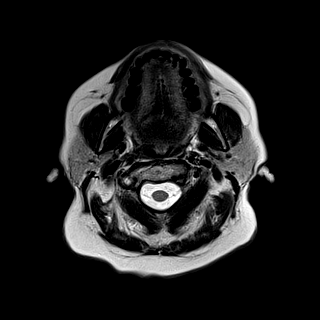
[im 28/28]
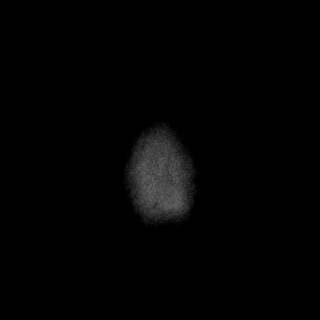

[Series 11: FLAIR · axial · 5.0mm · 0.47mm/px · z∈[-102,+59]mm · 2 of 28 slices shown]
[im 1/28]
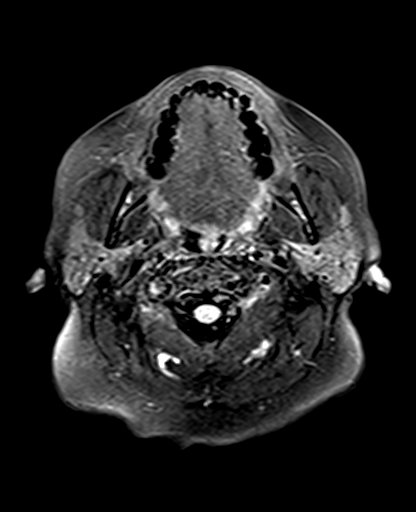
[im 28/28]
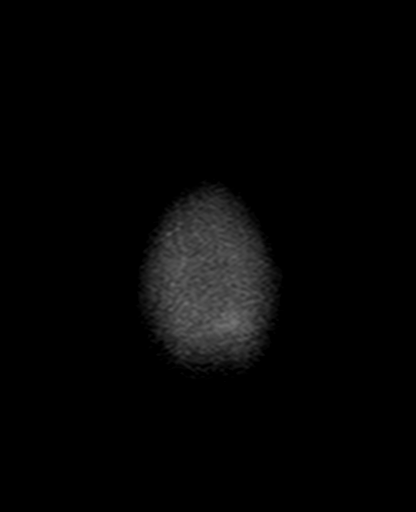

[Series 12: mag_images · axial · 3.0mm · 0.94mm/px · z∈[-104,+61]mm · 4 of 56 slices shown]
[im 1/56]
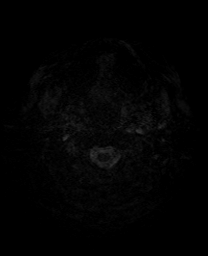
[im 19/56]
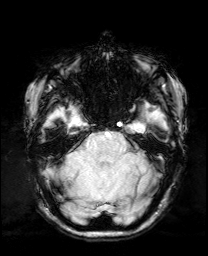
[im 37/56]
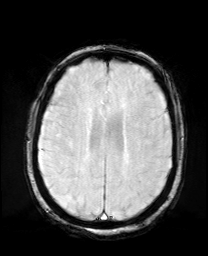
[im 56/56]
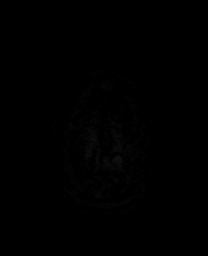

[Series 13: pha_images · axial · 3.0mm · 0.94mm/px · z∈[-104,+61]mm · 4 of 56 slices shown]
[im 1/56]
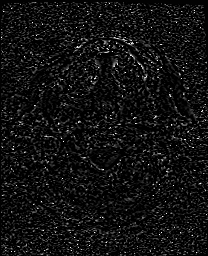
[im 19/56]
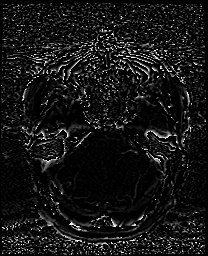
[im 37/56]
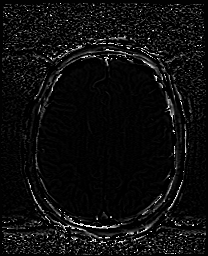
[im 56/56]
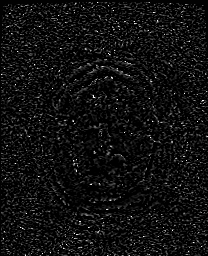

[Series 14: swi_images · axial · 3.0mm · 0.94mm/px · z∈[-104,+61]mm · 4 of 56 slices shown]
[im 1/56]
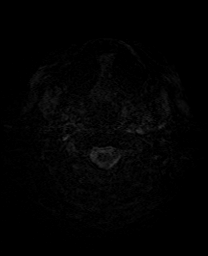
[im 19/56]
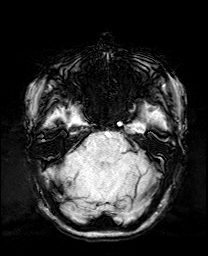
[im 37/56]
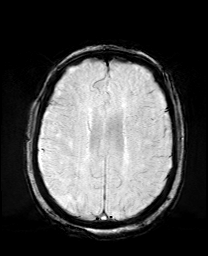
[im 56/56]
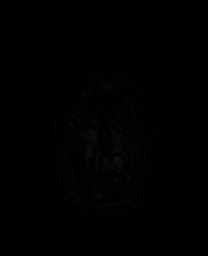

[Series 15: mip_images(sw) · axial · 24.0mm · 0.94mm/px · z∈[-93,+50]mm · 4 of 49 slices shown]
[im 1/49]
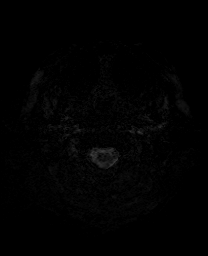
[im 17/49]
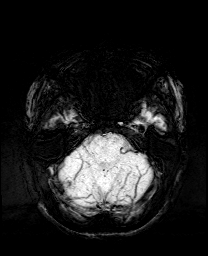
[im 33/49]
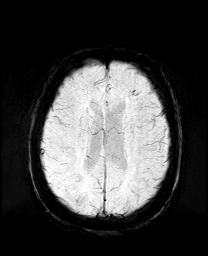
[im 49/49]
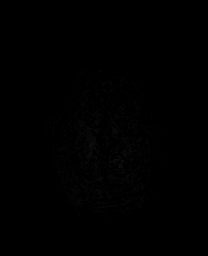

[Series 17: T2 · coronal · 5.0mm · 0.34mm/px · 2 of 33 slices shown (2 of 2)]
[im 1/33]
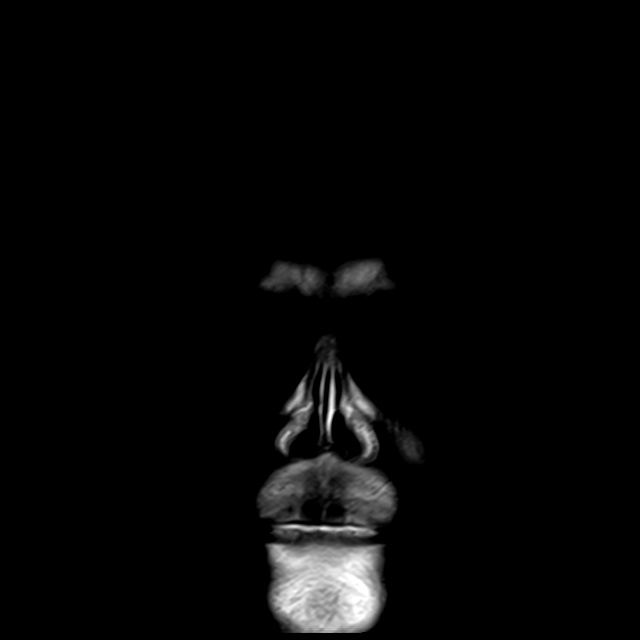
[im 33/33]
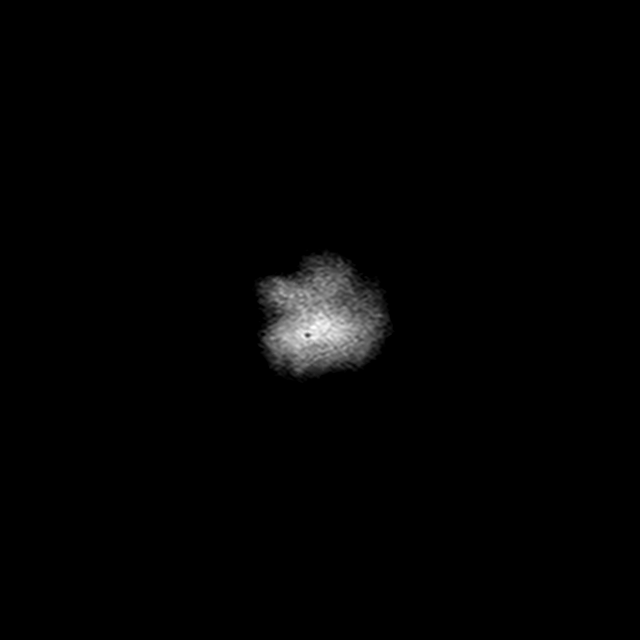

[44 of 48 positions shown; findings below may reference images not displayed]

FINDINGS: Brain: There are multiple small foci of acute/early subacute
ischemia within both hemispheres. The most notable lesions are in
the right basal ganglia and thalamus. There also punctate lesions in
the left parietal lobe and right cerebellum. No acute or chronic
hemorrhage. Old left cerebellar infarct. Mild findings of chronic
small vessel disease. The midline structures are normal.

Vascular: Focus of magnetic susceptibility effects at the right PCA
P1 segment may a stent.

Skull and upper cervical spine: Normal calvarium and skull base.
Visualized upper cervical spine and soft tissues are normal.

Sinuses/Orbits:No paranasal sinus fluid levels or advanced mucosal
thickening. No mastoid or middle ear effusion. Normal orbits.
IMPRESSION: 1. Multiple small foci of acute/early subacute ischemia within both
hemispheres, with largest lesions in the right basal ganglia and
thalamus. No hemorrhage or mass effect.
2. Old left cerebellar infarct and mild findings of chronic small
vessel disease.

## 2021-01-05 IMAGING — XA IR PERCUTANEOUS ART THORMBECTOMY/INFUSION INTRACRANIAL INCLUDE D
11 of 22 series · 11 of 24 positions shown · IV contrast (IODINE)
Comparison: CT/CT angiogram of the head and neck [DATE].

INDICATION: 70-year-old female with past medical history of hypertension and
atrial fibrillation on Eliquis presenting to [HOSPITAL]
with acute onset dizziness, slurred speech, left hemianopia and
left-sided weakness. Her last known well was [DATE] a.m. on
[DATE]. NIHSS at presentation was 15 with a baseline modified
ANDRADE of 0. Head CT showed no evidence of large acute infarct
or hemorrhage (ASPECTS 10). No IV thrombolytic administered due to
Eliquis administration the night before. CT angiogram of the head
and neck showed a right PCA/P1 segment occlusion. She was
transferred to our service for a diagnostic cerebral angiogram and
mechanical thrombectomy.

EXAM:
ULTRASOUND-GUIDED VASCULAR [REDACTED] CEREBRAL ANGIOGRAM
MECHANICAL THROMBECTOMY
FLAT PANEL HEAD CT
INTRACRANIAL STENTING AND ANGIOPLASTY
TECHNIQUE: Informed written consent was obtained from the patient's husband
after a thorough discussion of the procedural risks, benefits and
alternatives. All questions were addressed.

[Series 1: fl neuro · 1 of 27 frames shown]
[frame 23/27]
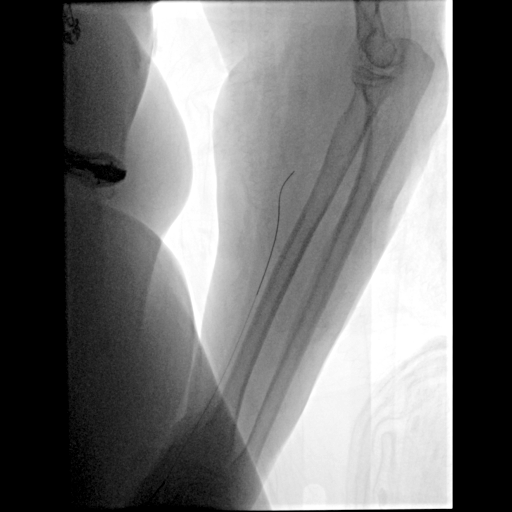

[Series 3: cerebral · 2 acquisitions, 1 frame shown]
[im 1/2]
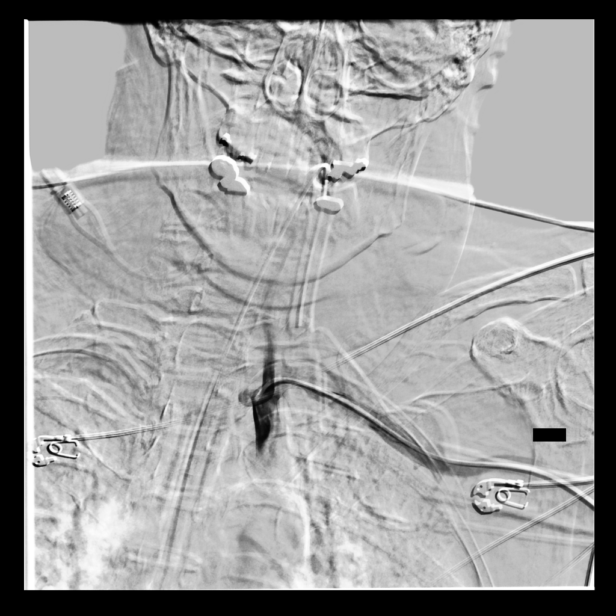

[Series 5: n roadmap · 2 acquisitions, 1 frame shown (1 of 2)]
[im 1/2]
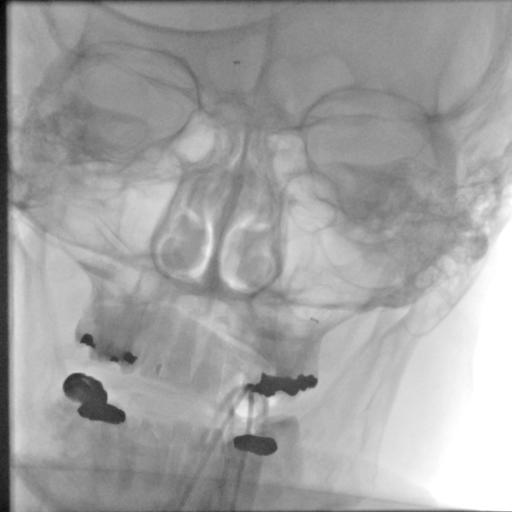

[Series 7: cerebral 2 · 2 acquisitions, 1 frame shown (1 of 6)]
[im 1/2]
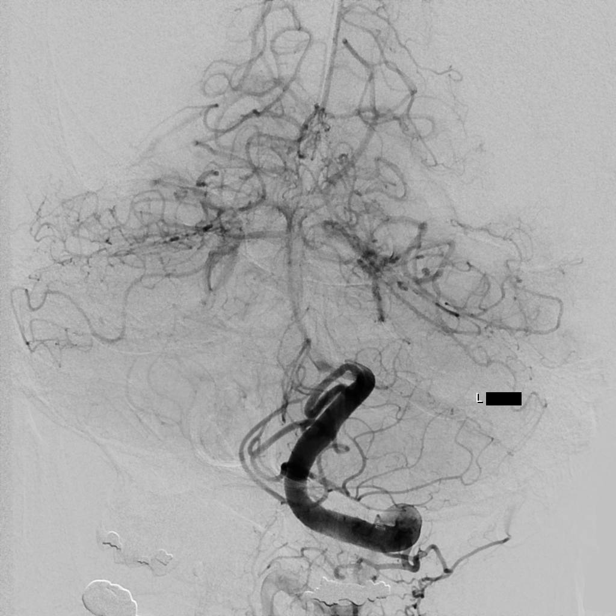

[Series 9: n roadmap · 2 acquisitions, 1 frame shown (2 of 2)]
[im 1/2]
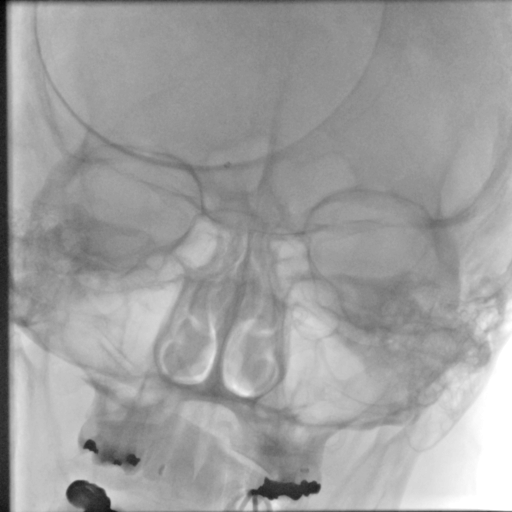

[Series 12: cerebral 2 · 2 acquisitions, 1 frame shown (2 of 6)]
[im 1/2]
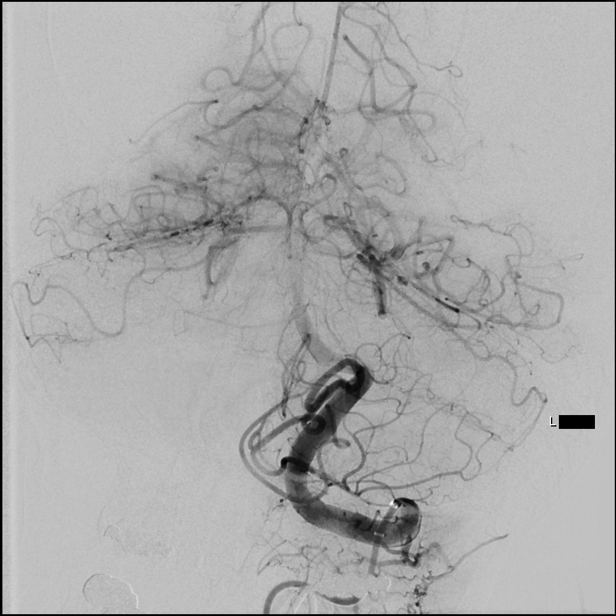

[Series 14: cerebral 2 · 2 acquisitions, 1 frame shown (3 of 6)]
[im 1/2]
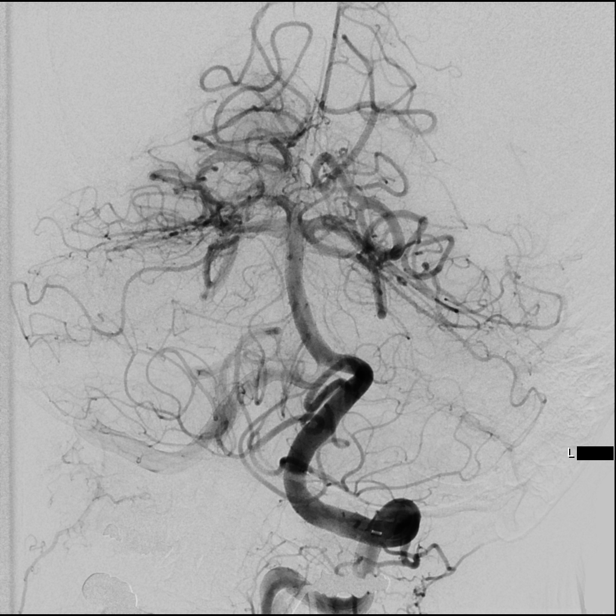

[Series 16: cerebral 2 · 2 acquisitions, 1 frame shown (4 of 6)]
[im 1/2]
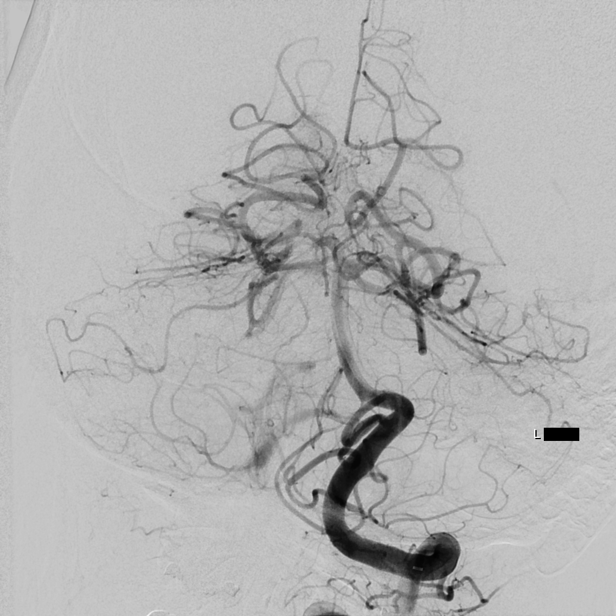

[Series 17: cerebral 2 · 2 acquisitions, 1 frame shown (5 of 6)]
[im 1/2]
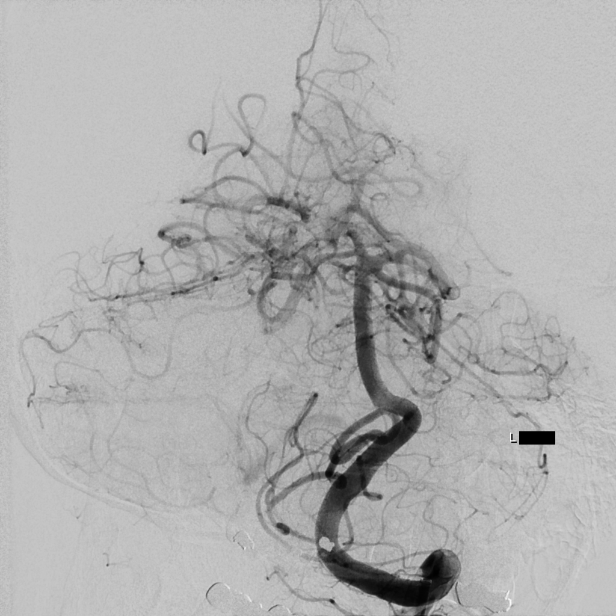

[Series 19: cerebral 2 · 2 acquisitions, 1 frame shown (6 of 6)]
[im 1/2]
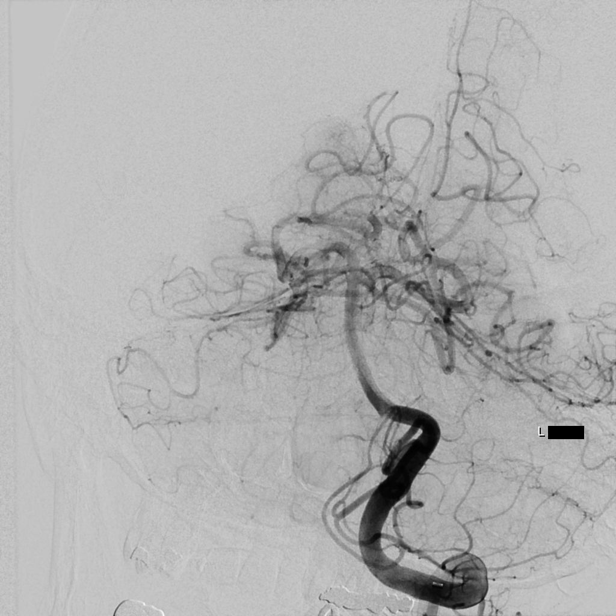

[Series 21: single · 1 of 2 slices shown]
[im 1/2]
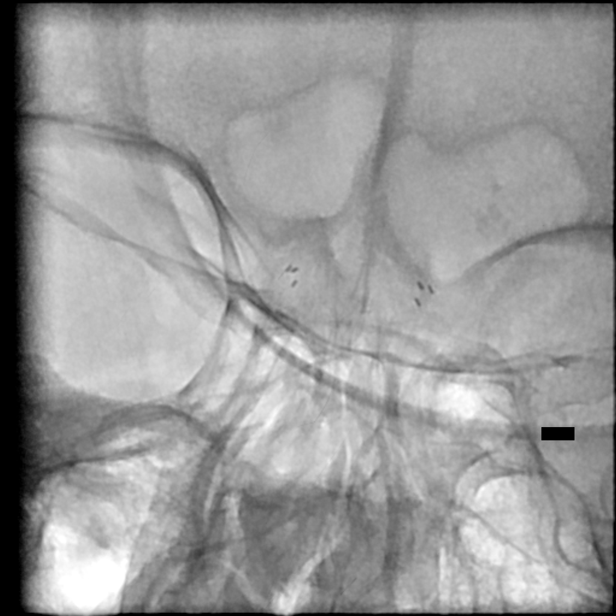

[11 of 24 positions shown; findings below may reference images not displayed]

MEDICATIONS:
Cangrelor IV bolus and drip.

180 mg of ticagrelor and 325 mg of aspirin via OG tube.

ANESTHESIA/SEDATION:
The procedure was performed under general anesthesia.

CONTRAST:  140 mL of Omnipaque 240 milligram/mL

FLUOROSCOPY TIME:  Fluoroscopy Time: 63 minutes 6 seconds ([5Q]
mGy).

COMPLICATIONS:
None immediate.
Maximal Sterile Barrier Technique was utilized including caps, mask,
sterile gowns, sterile gloves, sterile drape, hand hygiene and skin
antiseptic. A timeout was performed prior to the initiation of the
procedure.

Using the modified Seldinger technique and a micropuncture kit,
access was gained to the distal left radial artery at the anatomical
snuffbox and a 7 French sheath was placed. Real-time ultrasound
guidance was utilized for vascular access including the acquisition
of a permanent ultrasound image documenting patency of the accessed
vessel. Slow intra arterial infusion of 300 mcg nitroglycerin
diluted in patient's own blood was performed. No significant
fluctuation in patient's blood pressure seen. Then, a left radial
artery angiogram was obtained via sheath side port.

Next, a 5 French JB 2 glide catheter was navigated over a 0.035"
Terumo Glidewire into the proximal left subclavian artery under
fluoroscopic guidance. Frontal and lateral angiograms of the neck
were obtained. Under biplane roadmap, the catheter was then advanced
into the V2 segment of the left vertebral artery. The catheter was
exchanged over the wire and under biplane roadmap for a 7 French
[REDACTED] catheter which was placed in the left V2-V3 junction. Frontal
and lateral angiograms of the head were obtained.
FINDINGS: 1. Normal brachial artery branching pattern seen. No significant
anatomical variation. The left radial artery caliber is adequate for
vascular access.
2. Occlusion of the right P1/PCA segment.

PROCEDURE:
Under biplane roadmap, a 5 ANDRADE aspiration catheter was
navigated over an Aristotle 14 microguidewire into the basilar
artery. The aspiration catheter was then advanced to the level of
occlusion in the right PCA and connected to an aspiration pump.
Continuous aspiration was performed for 2 minutes. The guide
catheter was connected to a VacLok syringe. The aspiration catheter
was subsequently removed under constant aspiration. The guide
catheter was aspirated for debris.

Left vertebral artery angiograms with magnified frontal and lateral
views of the head showed recanalization of the right PCA (TICI 3)
with brisk distal flow. Underlying severe stenosis at the proximal
P2 segment was noted. Intra arterial infusion 5 mg of verapamil was
performed. Delayed angiograms showed slow flow in the right PCA
distal to the stenosis without improvement of the stenosis.

Under biplane roadmap, a 5 ANDRADE aspiration catheter was
navigated over an Aristotle 14 microguidewire into the basilar
artery. The aspiration catheter was then advanced to the level of
occlusion in the right PCA and connected to an aspiration pump.
Continuous aspiration was performed for 2 minutes. The guide
catheter was connected to a VacLok syringe. The aspiration catheter
was subsequently removed under constant aspiration. The guide
catheter was aspirated for debris.

Left vertebral artery angiograms with magnified frontal and lateral
views of the head showed improved anterograde flow with persistent
stenosis. The ANDRADE catheter was navigated over the wire into the
right P1/PCA and intra arterial infusion 5 mg of verapamil was
performed directly into the right ICA. Delayed angiograms showed
slow flow in the right PCA distal to the stenosis without
improvement of the stenosis.

Under biplane roadmap, a 5 ANDRADE aspiration catheter was
navigated over a phenom 21 microcatheter and a Aristotle 14
microguidewire into the basilar artery. The microcatheter was then
navigated over the wire into the distal right P2/PCA. Then, a 3 mm
solitaire stent retriever was deployed spanning the right P1 and P2
segment. The device was allowed to intercalated with the clot for 4
minutes. The microcatheter was removed. The aspiration catheter was
advanced to the level of occlusion and connected to a penumbra
aspiration pump. The thrombectomy device and aspiration catheter
were removed under constant aspiration.

Left vertebral artery angiograms with magnified frontal and lateral
views of the head again showed improved anterograde flow with
persistent stenosis.

Flat panel CT of the head was obtained and post processed in a
separate workstation with concurrent attending physician
supervision. Selected images were sent to PACS. No evidence of
hemorrhagic transformation.

Delayed left vertebral artery angiograms with multiple magnified
oblique views of the head showed persistent stenosis of the proximal
right P2 segment with progressive slowed flow.

Patient was loaded on Cangrelor followed by continuous infusion.

Under biplane roadmap an SL 10 microcatheter was navigated over a
synchro 2 micro guidewire into the distal right P2/PCA. Then, a 3 x
15 mm neural form atlas intracranial stent was deployed spanning the
P1 and proximal P2 segments, across the stenosis.

Left vertebral artery angiograms with frontal and lateral views of
the head showed adequate stent positioning across the stenosis,
however, there was significant residual stenosis.

Under biplane roadmap an SL 10 microcatheter was navigated over a
synchro 2 micro guidewire into the recently deployed stent. The
microcatheter was then exchanged over the wire for a 1.5 x 15 mm
[REDACTED] balloon. In stent angioplasty was performed under
fluoroscopy.

Left vertebral artery angiograms with frontal and lateral views of
the head showed improved anterograde flow and improved degree of
stenosis with mild residual stenosis.

Flat panel CT of the head was obtained and post processed in a
separate workstation with concurrent attending physician
supervision. Selected images were sent to PACS. No evidence of
hemorrhagic complication.

The catheter was subsequently withdrawn.

An inflatable band was placed and inflated over the left hand access
site. The vascular sheath was withdrawn and the band was slowly
deflated until brisk flow was noted through the arteriotomy site. At
this point, the band was reinflated with additional 2 cc of air to
obtain patent hemostasis.
IMPRESSION: Successful mechanical thrombectomy followed by stenting and
angioplasty for treatment of a right P1/PCA occlusion with
underlying proximal P2 segment stenosis.

PLAN:
Follow-up cerebral angiogram in 6 months to evaluate stent patency.

## 2021-01-05 SURGERY — IR WITH ANESTHESIA
Anesthesia: General

## 2021-01-05 MED ORDER — SODIUM CHLORIDE 0.9 % IV SOLN
INTRAVENOUS | Status: DC | PRN
  Administered 2021-01-05: 2 ug/kg/min via INTRAVENOUS

## 2021-01-05 MED ORDER — ASPIRIN 325 MG PO TABS
ORAL_TABLET | ORAL | Status: AC
  Filled 2021-01-05: qty 1

## 2021-01-05 MED ORDER — LEVOTHYROXINE SODIUM 25 MCG PO TABS
50.0000 ug | ORAL_TABLET | Freq: Every day | ORAL | Status: DC
  Administered 2021-01-07: 50 ug via ORAL
  Filled 2021-01-05: qty 2

## 2021-01-05 MED ORDER — FENTANYL CITRATE (PF) 250 MCG/5ML IJ SOLN
INTRAMUSCULAR | Status: DC | PRN
  Administered 2021-01-05 (×2): 50 ug via INTRAVENOUS

## 2021-01-05 MED ORDER — NITROGLYCERIN 1 MG/10 ML FOR IR/CATH LAB
INTRA_ARTERIAL | Status: AC
  Filled 2021-01-05: qty 10

## 2021-01-05 MED ORDER — SODIUM CHLORIDE 0.9 % IV SOLN: INTRAVENOUS | Status: DC

## 2021-01-05 MED ORDER — VERAPAMIL HCL 2.5 MG/ML IV SOLN
INTRAVENOUS | Status: DC | PRN
  Administered 2021-01-05 (×2): 5 mg via INTRA_ARTERIAL

## 2021-01-05 MED ORDER — VERAPAMIL HCL 2.5 MG/ML IV SOLN
INTRAVENOUS | Status: AC
  Filled 2021-01-05: qty 2

## 2021-01-05 MED ORDER — FLECAINIDE ACETATE 100 MG PO TABS: 100.0000 mg | ORAL_TABLET | Freq: Two times a day (BID) | ORAL | Status: DC

## 2021-01-05 MED ORDER — ONDANSETRON HCL 4 MG/2ML IJ SOLN: 4.0000 mg | Freq: Four times a day (QID) | INTRAMUSCULAR | Status: DC | PRN

## 2021-01-05 MED ORDER — STROKE: EARLY STAGES OF RECOVERY BOOK: Freq: Once | Status: DC

## 2021-01-05 MED ORDER — ASPIRIN 325 MG PO TABS: 325.0000 mg | ORAL_TABLET | Freq: Every day | ORAL | Status: DC

## 2021-01-05 MED ORDER — PROPOFOL 10 MG/ML IV BOLUS
INTRAVENOUS | Status: DC | PRN
  Administered 2021-01-05: 150 mg via INTRAVENOUS

## 2021-01-05 MED ORDER — LACTATED RINGERS IV SOLN: INTRAVENOUS | Status: DC | PRN

## 2021-01-05 MED ORDER — SUCCINYLCHOLINE CHLORIDE 200 MG/10ML IV SOSY
PREFILLED_SYRINGE | INTRAVENOUS | Status: DC | PRN
  Administered 2021-01-05: 100 mg via INTRAVENOUS

## 2021-01-05 MED ORDER — CLEVIDIPINE BUTYRATE 0.5 MG/ML IV EMUL
0.0000 mg/h | INTRAVENOUS | Status: DC
  Filled 2021-01-05: qty 50

## 2021-01-05 MED ORDER — PHENYLEPHRINE HCL-NACL 20-0.9 MG/250ML-% IV SOLN
INTRAVENOUS | Status: DC | PRN
Start: 2021-01-05 — End: 2021-01-05
  Administered 2021-01-05: 25 ug/min via INTRAVENOUS

## 2021-01-05 MED ORDER — TICAGRELOR 90 MG PO TABS
90.0000 mg | ORAL_TABLET | Freq: Two times a day (BID) | ORAL | Status: DC
  Administered 2021-01-06 – 2021-01-07 (×3): 90 mg via ORAL
  Filled 2021-01-05 (×4): qty 1

## 2021-01-05 MED ORDER — ONDANSETRON HCL 4 MG/2ML IJ SOLN
INTRAMUSCULAR | Status: DC | PRN
  Administered 2021-01-05: 4 mg via INTRAVENOUS

## 2021-01-05 MED ORDER — TICAGRELOR 90 MG PO TABS
90.0000 mg | ORAL_TABLET | Freq: Two times a day (BID) | ORAL | Status: DC
  Filled 2021-01-05 (×4): qty 1

## 2021-01-05 MED ORDER — PHENYLEPHRINE 40 MCG/ML (10ML) SYRINGE FOR IV PUSH (FOR BLOOD PRESSURE SUPPORT)
PREFILLED_SYRINGE | INTRAVENOUS | Status: DC | PRN
  Administered 2021-01-05: 80 ug via INTRAVENOUS

## 2021-01-05 MED ORDER — DEXAMETHASONE SODIUM PHOSPHATE 10 MG/ML IJ SOLN
INTRAMUSCULAR | Status: DC | PRN
  Administered 2021-01-05: 10 mg via INTRAVENOUS

## 2021-01-05 MED ORDER — TICAGRELOR 60 MG PO TABS
ORAL_TABLET | ORAL | Status: DC | PRN
Start: 2021-01-05 — End: 2021-01-05
  Administered 2021-01-05: 180 mg

## 2021-01-05 MED ORDER — ASPIRIN 300 MG RE SUPP: 300.0000 mg | Freq: Every day | RECTAL | Status: DC

## 2021-01-05 MED ORDER — ASPIRIN 81 MG PO CHEW
81.0000 mg | CHEWABLE_TABLET | Freq: Every day | ORAL | Status: DC
  Administered 2021-01-06: 81 mg via ORAL
  Filled 2021-01-05: qty 1

## 2021-01-05 MED ORDER — IOHEXOL 350 MG/ML SOLN
75.0000 mL | Freq: Once | INTRAVENOUS | Status: AC | PRN
  Administered 2021-01-05: 75 mL via INTRAVENOUS

## 2021-01-05 MED ORDER — ACETAMINOPHEN 650 MG RE SUPP: 650.0000 mg | RECTAL | Status: DC | PRN

## 2021-01-05 MED ORDER — TICAGRELOR 90 MG PO TABS
ORAL_TABLET | ORAL | Status: AC
  Filled 2021-01-05: qty 2

## 2021-01-05 MED ORDER — SUGAMMADEX SODIUM 200 MG/2ML IV SOLN
INTRAVENOUS | Status: DC | PRN
  Administered 2021-01-05: 400 mg via INTRAVENOUS

## 2021-01-05 MED ORDER — ASPIRIN 81 MG PO CHEW: 81.0000 mg | CHEWABLE_TABLET | Freq: Every day | ORAL | Status: DC

## 2021-01-05 MED ORDER — ROCURONIUM BROMIDE 10 MG/ML (PF) SYRINGE
PREFILLED_SYRINGE | INTRAVENOUS | Status: DC | PRN
  Administered 2021-01-05 (×2): 20 mg via INTRAVENOUS
  Administered 2021-01-05: 50 mg via INTRAVENOUS

## 2021-01-05 MED ORDER — ACETAMINOPHEN 325 MG PO TABS
650.0000 mg | ORAL_TABLET | ORAL | Status: DC | PRN
  Administered 2021-01-06 – 2021-01-07 (×2): 650 mg via ORAL
  Filled 2021-01-05 (×2): qty 2

## 2021-01-05 MED ORDER — ASPIRIN 325 MG PO TABS
ORAL_TABLET | ORAL | Status: DC | PRN
  Administered 2021-01-05: 325 mg

## 2021-01-05 MED ORDER — IOHEXOL 350 MG/ML SOLN
100.0000 mL | Freq: Once | INTRAVENOUS | Status: AC | PRN
  Administered 2021-01-05: 80 mL via INTRA_ARTERIAL

## 2021-01-05 MED ORDER — IOHEXOL 350 MG/ML SOLN
100.0000 mL | Freq: Once | INTRAVENOUS | Status: AC | PRN
  Administered 2021-01-05: 60 mL via INTRA_ARTERIAL

## 2021-01-05 MED ORDER — ACETAMINOPHEN 160 MG/5ML PO SOLN: 650.0000 mg | ORAL | Status: DC | PRN

## 2021-01-05 MED ORDER — LIDOCAINE 2% (20 MG/ML) 5 ML SYRINGE
INTRAMUSCULAR | Status: DC | PRN
  Administered 2021-01-05: 60 mg via INTRAVENOUS

## 2021-01-05 MED ORDER — NITROGLYCERIN 1 MG/10 ML FOR IR/CATH LAB
INTRA_ARTERIAL | Status: DC | PRN
  Administered 2021-01-05: 200 ug
  Administered 2021-01-05: 300 ug via INTRA_ARTERIAL

## 2021-01-05 MED ORDER — NITROGLYCERIN 0.2 MG/ML ON CALL CATH LAB
INTRAVENOUS | Status: DC | PRN
  Administered 2021-01-05: 20 ug via INTRAVENOUS

## 2021-01-05 MED ORDER — CANGRELOR TETRASODIUM 50 MG IV SOLR
INTRAVENOUS | Status: AC
  Filled 2021-01-05: qty 50

## 2021-01-05 NOTE — Consult Note (Signed)
NEUROLOGY TELECONSULTATION NOTE   Date of service: January 05, 2021 Patient Name: Renee Benitez MRN:  [id number] DOB:  [birth year] Reason for consult: stroke code  Requesting Provider: Dr. Pricilla Loveless Consult Participants: myself, patient, patient's husband, bedside RN, telestroke RN Location of the provider: [facility] Location of the patient: Renee Benitez  This consult was provided via telemedicine with 2-way video and audio communication. The patient/family was informed that care would be provided in this way and agreed to receive care in this manner.   _ _ _   _ __   _ __ _ _  __ __   _ __   __ _  History of Present Illness   This is a 70 yo woman with hx HTN, a fib on eliquis (last dose yesterday evening) who presented to [facility] ED after developing acute onset dizziness, slurred speech, and L-sided weakness (face,arm,length). LKW 1125am today when she was walking out of her bank. Dizziness, slurred speech, and left sided weakness in face/arm/leg has persisted since then. Per husband she could initially still move her L hand a bit but now it has no movement. CTH showed NAICP, no ICH, ASPECTS 10. NIHSS = 15.   On examination she has new R ptosis and is unable to look past midline to L in either eye. Patient did not cooperate with pupil exam, unknown if myosis on R. No trauma or neck pain per patient report. Unable to see out of left lateral half of left eye, UTA R VFs 2/2 patient cooperation. L facial droop. LUE>>LLE weakness. Lethargic.  TNK contraindicated 2/2 on eliquis last dose last night. CTA H&N were performed to rule out LVO and also to r/o Carotid dissection in the setting of potential Horners on the R. CTP was ordered due to the fact that patient became confused when describing her symptoms and I wanted to get some additional information to support onset this AM and not earlier.  CTA shows occlusion of the R PCA just beyond prox P1 segment with partial reconstitution at the  level of prox P2 segment. Case discussed with Dr. Salvadore Dom with neuointervention and we decided to call code IR. I discussed the benefits and risks of the procedure with her husband Renee Benitez [phone]) over televideo. I also told him that while most patients in this situation have a better outcome due to the procedure, I also explained potential complications incl a 10% risk of cerebral hemorrhage. He expressed understanding and gave informed consent for [place] to proceed with the procedure.  IMAGING  CTH  There is no acute intracranial hemorrhage or definite evidence of acute infarction. ASPECT score is 10.   Questionable small area of low density involving anterior limb of right internal capsule could reflect chronic microvascular ischemic change or age-indeterminate infarct. Small chronic L cerebellar infarcts.  CTA H&N / CTP  Occlusion of the right PCA just beyond the proximal P1 segment. There is subsequent partial reconstitution at the level of the proximal right P2 segment.   Perfusion imaging demonstrates 6 mL of penumbra within the right occipital lobe.   No hemodynamically significant stenosis in the neck.  No e/o carotid dissection.  CNS imaging personally reviewed and discussed by phone with radiologist Dr. Allena Katz. I agree with above interpretation   ROS   Per HPI; all other systems reviewed and were negative  Past History   Past Medical History:  Diagnosis Date   Atrial fibrillation (HCC)    Current use of estrogen therapy  01/31/2013   Dysrhythmia    AFib   Hypertension    Macular degeneration    Menopausal syndrome    Right ovarian cyst 01/25/2014   Past Surgical History:  Procedure Laterality Date   ABDOMINAL HYSTERECTOMY     APPENDECTOMY     CARDIOVERSION N/A 09/14/2016   Procedure: CARDIOVERSION;  Surgeon: [name], MD;  Location: [facility];  Service: Cardiovascular;  Laterality: N/A;   CATARACT EXTRACTION W/ INTRAOCULAR LENS   IMPLANT, BILATERAL Bilateral    COLONOSCOPY  12/04/03   Friable anal canal otherwise normal rectum and colon   COLONOSCOPY N/A 03/19/2013   Dr. [name]: diverticulosis, hemorrhoids. next TCS 10 years   COLONOSCOPY N/A 05/02/2019   Procedure: COLONOSCOPY;  Surgeon: [name], MD;  Location: [facility];  Service: Endoscopy;  Laterality: N/A;  7:30am   ESOPHAGOGASTRODUODENOSCOPY N/A 10/17/2018   Procedure: ESOPHAGOGASTRODUODENOSCOPY (EGD);  Surgeon: [name], MD;  Location: [facility];  Service: Endoscopy;  Laterality: N/A;  3:00pm   MALONEY DILATION N/A 10/17/2018   Procedure: Elease Hashimoto DILATION;  Surgeon: [name], MD;  Location: [facility];  Service: Endoscopy;  Laterality: N/A;   TEE WITHOUT CARDIOVERSION N/A 09/14/2016   Procedure: TRANSESOPHAGEAL ECHOCARDIOGRAM (TEE) WITH PROPOFOL;  Surgeon: [name], MD;  Location: [facility];  Service: Cardiovascular;  Laterality: N/A;   TONSILECTOMY, ADENOIDECTOMY, BILATERAL MYRINGOTOMY AND TUBES     Family History  Problem Relation Age of Onset   Coronary artery disease Father    Cancer Father        bladder cancer   Arrhythmia Sister        Reportedly had ablation of SVT   Hypertension Sister    Colon cancer Sister 67       s/p surgical resection, no chemo/radiation   Colon cancer Maternal Grandmother    Hypertension Son    Social History   Socioeconomic History   Marital status: Married    Spouse name: Not on file   Number of children: Not on file   Years of education: Not on file   Highest education level: Not on file  Occupational History   Occupation: Nurse    Comment: [employer]  Tobacco Use   Smoking status: Never   Smokeless tobacco: Never   Tobacco comments:    Never smoked  Vaping Use   Vaping Use: Never used  Substance and Sexual Activity   Alcohol use: Yes    Alcohol/week: 1.0 standard drink    Types: 1 Glasses of wine per week    Comment: occ wine   Drug use: No   Sexual activity: Yes     Birth control/protection: Surgical    Comment: hyst  Other Topics Concern   Not on file  Social History Narrative   Not on file   Social Determinants of Health   Financial Resource Strain: Low Risk    Difficulty of Paying Living Expenses: Not hard at all  Food Insecurity: No Food Insecurity   Worried About Programme researcher, broadcasting/film/video in the Last Year: Never true   Ran Out of Food in the Last Year: Never true  Transportation Needs: No Transportation Needs   Lack of Transportation (Medical): No   Lack of Transportation (Non-Medical): No  Physical Activity: Insufficiently Active   Days of Exercise per Week: 1 day   Minutes of Exercise per Session: 30 min  Stress: Stress Concern Present   Feeling of Stress : To some extent  Social Connections: Socially Integrated   Frequency of Communication with Friends and Family: Three times a week   Frequency of Social Gatherings with Friends and Family: Twice a week   Attends Religious Services: More than 4 times per year   Active Member of Clubs or Organizations: Yes   Attends Banker Meetings: More than 4 times per year   Marital Status: Married   No Known Allergies  Medications   (Not in a hospital admission)    Vitals   Vitals:   01/05/21 1224 01/05/21 1230  BP: (!) 148/61 131/75  Pulse: (!) 58 (!) 58  Resp: 15 17  SpO2: 95% 93%  Weight:  93.4 kg     Body mass index is 33 kg/m.  Physical Exam   Exam performed over telemedicine with 2-way video and audio communication and with assistance of bedside RN  Physical Exam Gen: lethargic, oriented x3, NAD Resp: normal WOB CV: extremities appear well-perfused  Neuro: *MS: lethargic, oriented x3, able to follow some simple commands but not multi-step *Speech: mod dysarthria, mild impairment of naming and reptition *CN: L Pupil RL 44mm, Patient would not allow examination of her L pupil, she has marked R ptosis which is new. She is unable to look left past midline in  her R eye. EOM and VFF unable to be tested in L eye 2/2 patient cooperation. She was unable to see out of the left lateral hemifield of her L eye. sensation intact, smile symmetric, hearing intact to voice Motor: RUE and RLE 5/5 throughout with no drift. LUE no movement. LLE some movement against gravity but drops to bed within 1-2 sec. *Sensory: SILT. Symmetric. No double-simultaneous extinction.  *Coordination:  FNF intact on R, UTA on L 2/2 weakness *Reflexes:  UTA 2/2 tele-exam *Gait: deferred   NIHSS   1a Level of Conscious.: 0 1b LOC Questions: 0 1c LOC Commands: 1 2 Best Gaze: 1 3 Visual: 1 4 Facial Palsy: 1 5a Motor Arm - left: 4 5b Motor Arm - Right: 0 6a Motor Leg - Left: 3 6b Motor Leg - Right: 0 7 Limb Ataxia: 0 8 Sensory: 0 9 Best Language: 1 10 Dysarthria: 2 11 Extinct. and Inatten.: 1  TOTAL: 15   Premorbid mRS = 1  Labs   CBC:  Recent Labs  Lab 01/05/21 1206 01/05/21 1222  WBC 6.1  --   NEUTROABS 2.8  --   HGB 14.1 13.9  HCT 42.0 41.0  MCV 92.7  --   PLT 226  --     Basic Metabolic Panel:  Lab Results  Component Value Date   NA 142 01/05/2021   K 4.4 01/05/2021   CO2 25 07/31/2020   GLUCOSE 92 01/05/2021   BUN 16 01/05/2021   CREATININE 0.90 01/05/2021   CALCIUM 9.6 07/31/2020   GFRNONAA >60 07/31/2020   GFRAA >60 11/07/2018   Lipid Panel: No results found for: LDLCALC HgbA1c: No results found for: HGBA1C Urine Drug Screen: No results found for: LABOPIA, COCAINSCRNUR, LABBENZ, AMPHETMU, THCU, LABBARB  Alcohol Level No results found for: ETH   Impression   This is a 70 yo woman with hx HTN, a fib on eliquis (last dose yesterday evening) who presented to [facility] ED after developing acute onset dizziness, slurred speech, and L-sided weakness (face,arm,length). LKW 1125am. Not TNK candidate 2/2 pt is on eliquis. CTA showed R PCA occlusion.   Recommendations   - Emergent transfer to [facility] for thrombectomy - Dr. Amada Jupiter  [facility]  neuro will meet patient at bridge and then bring him directly to IR suite - Recommend re-examination prior to intubation as patient's exam seems to be fluctuating. - Postprocedure patient will be admitted to neuro ICU under Dr. Ritta Slot  CONSENT CTA shows occlusion of the R PCA just beyond prox P1 segment with partial reconstitution at the level of prox P2 segment. Case discussed with Dr. Salvadore Dom with neuointervention and we decided to call code IR. I discussed the benefits and risks of the procedure with her husband Renee Benitez [phone]) over televideo. I also told him that while most patients in this situation have a better outcome due to the procedure, I also explained potential complications incl a 10% risk of cerebral hemorrhage. He expressed understanding and gave informed consent for [place] to proceed with the procedure. ______________________________________________________________________   Thank you for the opportunity to take part in the care of this patient. If you have any further questions, please contact the neurology consultation attending.  Signed,  Bing Neighbors, MD [facility] [phone]  If 7pm- 7am, please page neurology on call as listed in AMION.

## 2021-01-05 NOTE — ED Notes (Signed)
ED Provider at bedside. 

## 2021-01-05 NOTE — Anesthesia Preprocedure Evaluation (Addendum)
Anesthesia Evaluation  Patient identified by MRN, date of birth, ID band  Reviewed: Allergy & Precautions, NPO status , Patient's Chart, lab work & pertinent test results  Airway Mallampati: II  TM Distance: >3 FB Neck ROM: Full    Dental  (+) Dental Advisory Given, Teeth Intact   Pulmonary neg pulmonary ROS,    Pulmonary exam normal breath sounds clear to auscultation       Cardiovascular hypertension, Pt. on medications + DOE  + dysrhythmias  Rhythm:Regular Rate:Normal  Echo 01/2017 - Left ventricle: The cavity size was normal. Wall thickness was increased in a pattern of mild LVH. Systolic function was normal. The estimated ejection fraction was in the range of 60% to 65%. Doppler parameters are consistent with abnormal left ventricular relaxation (grade 1 diastolic dysfunction). Indeterminate filling pressures.  - Aortic valve: Mildly calcified annulus. Trileaflet. There was mild regurgitation.  - Mitral valve: Mildly thickened leaflets .  - Left atrium: The atrium was mildly dilated.  - Tricuspid valve: There was mild regurgitation.    Neuro/Psych CVA negative psych ROS   GI/Hepatic negative GI ROS, Neg liver ROS,   Endo/Other    Renal/GU negative Renal ROS     Musculoskeletal   Abdominal (+) + obese,   Peds  Hematology negative hematology ROS (+)   Anesthesia Other Findings   Reproductive/Obstetrics                            Anesthesia Physical  Anesthesia Plan  ASA: 3 and emergent  Anesthesia Plan: General   Post-op Pain Management:    Induction: Intravenous  PONV Risk Score and Plan:   Airway Management Planned: Oral ETT  Additional Equipment: Arterial line  Intra-op Plan:   Post-operative Plan: Possible Post-op intubation/ventilation  Informed Consent: I have reviewed the patients History and Physical, chart, labs and discussed the procedure including the risks,  benefits and alternatives for the proposed anesthesia with the patient or authorized representative who has indicated his/her understanding and acceptance.     Dental advisory given  Plan Discussed with: CRNA  Anesthesia Plan Comments:        Anesthesia Quick Evaluation

## 2021-01-05 NOTE — ED Notes (Signed)
[facility] here to transport pt. 

## 2021-01-05 NOTE — ED Triage Notes (Signed)
Pt LKW 1130 this morning. Pt reports while at the bank she become dizzy and had left sided facial droop and left sided weakness. Code stroke called by EMS.

## 2021-01-05 NOTE — Procedures (Signed)
INTERVENTIONAL NEURORADIOLOGY BRIEF POSTPROCEDURE NOTE  DIAGNOSTIC CEREBRAL ANGIOGRAM, MECHANICAL THROMBECTOMY AND INTRACRANIAL ANGIOPLASTY AND STENTING   Attending: Dr. [name]  Assistant: None.  Diagnosis: Right PCA/1 occlusion  Access site: Left distal radial artery  Access closure: TR band  Anesthesia: General anesthesia  Medication used: Cangrelor IV; ticagrelor 180 mg/ASA 325 mg..  Complications: None.  Estimated blood loss: 100 mL  Specimen: None.  Findings: Occlusion of the right P1/PCA. Mechanical thrombectomy performed with direct contact aspiration with complete recanalization (TICI 3). Underlying severe stenosis noted. Delay angiograms showed slow distal flow. Direct contact aspiration performed again with same result (normal flow followed by slow flow on delay angiogram). One pass mechanical thrombectomy performed with combined stent retriever and contact aspiration with similar result. Patient was then loaded with cangrelor and a 3 x 15 mm neuroform atlas was deployed in the right PCA, across the stenosis. Improved flow noted with persistent high grade stenosis. In stent angioplasty performed with a 1.5 mm balloon with improved stenosis and flow. Flat panel head CT showed no evidence of hemorrhagic complication. Patient received Ticagrelor 180 mg and ASA 325 mg via OG tube prior to extubation.  Delay between puncture and first pass: Technical problem with angiography table requiring restarting of the machine.  The patient tolerated the procedure well without incident or complication and is in stable condition.   PLAN: - No femoral puncture, therefore, no bed rest required.  - Continue DAPT with ASA 81 mg q.d. and ticagrelor 90 bid from tomorrow. - SBP 120-140 mmHg. - STAT head CT for any acute neurological decline.

## 2021-01-05 NOTE — H&P (Signed)
Neurology H&P  CC: Left-sided weakness  History is obtained from: Chart review, patient  HPI: Renee Benitez is a 70 y.o. female with a history of atrial fibrillation on Eliquis who presented to [facility] with left-sided weakness earlier today.  She had gone to her bank and was leaving when this started.  On arrival to [facility], she was evaluated by teleneurologist Dr. Selina Cooley who examined the patient and found her to have an NIH of 15.  She had a CT angiogram demonstrating large vessel occlusion of the right P1.  After discussion, there was consideration that this could be a symptomatic lesion through thalamic involvement and therefore the decision was made to pursue thrombectomy given the severity of the symptoms.   LKW: 11:25 AM tpa given?: No, anticoagulation IR Thrombectomy?  Yes Modified Rankin Scale: 1-No significant post stroke disability and can perform usual duties with stroke symptoms NIHSS: 10   ROS: A complete ROS was performed and is negative except as noted in the HPI.   Past Medical History:  Diagnosis Date   Atrial fibrillation [facility])    Current use of estrogen therapy 01/31/2013   Dysrhythmia    AFib   Hypertension    Macular degeneration    Menopausal syndrome    Right ovarian cyst 01/25/2014     Family History  Problem Relation Age of Onset   Coronary artery disease Father    Cancer Father        bladder cancer   Arrhythmia Sister        Reportedly had ablation of SVT   Hypertension Sister    Colon cancer Sister 6       s/p surgical resection, no chemo/radiation   Colon cancer Maternal Grandmother    Hypertension Son      Social History:  reports that she has never smoked. She has never used smokeless tobacco. She reports current alcohol use of about 1.0 standard drink per week. She reports that she does not use drugs.   Prior to Admission medications   Medication Sig Start Date End Date Taking? Authorizing Provider  acetaminophen  (TYLENOL) 500 MG tablet Take 1,000 mg by mouth every 6 (six) hours as needed for moderate pain or headache.    [provider]  Calcium Carb-Cholecalciferol (CALCIUM 600 + D PO) Take 1 tablet by mouth daily.    [provider]  Cholecalciferol (DIALYVITE VITAMIN D 5000) 125 MCG (5000 UT) capsule Take 5,000 Units by mouth daily.    [provider]  diltiazem (CARDIZEM) 60 MG tablet Take one tablet every 6 hours as needed for A-fib episodes    [provider]  diltiazem (CARTIA XT) 180 MG 24 hr capsule Take 1 capsule (180 mg total) by mouth daily. 08/07/20   Fenton, Clint R, PA  ELIQUIS 5 MG TABS tablet TAKE ONE TABLET (5MG  TOTAL) BY MOUTH TWOTIMES DAILY 09/26/20   Fenton, Clint R, PA  flecainide (TAMBOCOR) 100 MG tablet Take 1 tablet (100 mg total) by mouth 2 (two) times daily. 02/14/20   13/4/21, MD  levothyroxine (SYNTHROID) 50 MCG tablet Take 50 mcg by mouth daily. 11/03/20   [provider]  loratadine (CLARITIN) 10 MG tablet Take 10 mg by mouth daily as needed for allergies.    [provider]  Magnesium 250 MG TABS Take 250 mg by mouth at bedtime.     [provider]  metoprolol tartrate (LOPRESSOR) 25 MG tablet Take 1 tablet by mouth  every 8 hours as needed for breakthrough afib 08/07/20   Fenton, Clint R, PA  Multiple Vitamins-Minerals (MULTIVITAMIN WITH MINERALS) tablet Take 1 tablet by mouth daily.    [provider]  Multiple Vitamins-Minerals (PRESERVISION AREDS 2) CAPS Take 1 capsule by mouth 2 (two) times daily.    [provider]  Omega-3 Fatty Acids (FISH OIL ULTRA) 1400 MG CAPS Take 1,400 mg by mouth daily.    [provider]  pantoprazole (PROTONIX) 40 MG tablet TAKE ONE TABLET (40MG  TOTAL) BY MOUTH DAILY 01/17/20   03/18/20, NP  Simethicone (GAS-X PO) Take 1-2 tablets by mouth daily as needed (gas).    [provider]     Exam: Current vital signs: BP (!) 157/70 (BP  Location: Right Arm)   Pulse 62   Temp (!) 97.5 F (36.4 C) (Oral)   Resp 15   Wt 93.4 kg   SpO2 100%   BMI 33.00 kg/m    Physical Exam  Constitutional: Appears well-developed and well-nourished.  Psych: Affect appropriate to situation Eyes: No scleral injection HENT: No OP obstrucion Head: Normocephalic.  Cardiovascular: Normal rate and regular rhythm.  Respiratory: Effort normal and breath sounds normal to anterior ascultation GI: Soft.  No distension. There is no tenderness.  Skin: WDI  Neuro: Mental Status: Patient is awake, alert, oriented to person, place, month, age Patient is able to give a clear and coherent history. No signs of aphasia or neglect Cranial Nerves: II: L hemianopia. Pupils are equal, round, and reactive to light.   III,IV, VI: She appears to have a right gaze preference, but she does cross midline to the left V: Facial sensation is diminished on the left VII: Facial movement is weak on the left Motor: She has 2/5 weakness of the left upper extremity and 4/5 weakness of the left lower extremity, she has full strength in the right Sensory: She endorses symmetric sensation, but extinguishes to double simultaneous stimulation. Cerebellar: No clear ataxia  I have reviewed labs in epic and the pertinent results are: CMP-unremarkable CBC-normal, hemoglobin 14, WBC 6.1  I have reviewed the images obtained: CT/CTA right PCA occlusion, small amount of penumbra on perfusion imaging  Primary Diagnosis:  Cerebral infarction due to embolism of  right posterior cerebral artery.    Secondary Diagnosis: Essential (primary) hypertension and Paroxysmal atrial fibrillation   Impression: 70 year old female with a history of atrial fibrillation and hypertension who presents with what I suspect is an embolic right PCA occlusion.  Though her area of penumbra is relatively small, her deficits are significant and could be explained via thalamic involvement.  Given  this possibility, decision was made to proceed with IR thrombectomy.  Another possibility could be multifocal emboli, but given the risk of delaying thrombectomy for further imaging felt that proceeding emergently was the most prudent.  Plan: - HgbA1c, fasting lipid panel - MRI of the brain without contrast - Frequent neuro checks - Echocardiogram - CTA head and neck - Prophylactic therapy-pending postoperative scan - Risk factor modification - Telemetry monitoring - PT consult, OT consult, Speech consult - Stroke team to follow  -Hold home antihypertensives and anticoagulants for now  -Continue home flecainide -Continue home Synthroid for hypothyroidism  This patient is critically ill and at significant risk of neurological worsening, death and care requires constant monitoring of vital signs, hemodynamics,respiratory and cardiac monitoring, neurological assessment, discussion with family, other specialists and medical decision making of high complexity. I spent 60 minutes of neurocritical care  time  in the care of  this patient. This was time spent independent of any time provided by nurse practitioner or PA.  Ritta Slot, MD [facility] [phone]  If 7pm- 7am, please page neurology on call as listed in AMION.

## 2021-01-05 NOTE — ED Notes (Signed)
IV established and bloodwork

## 2021-01-05 NOTE — Sedation Documentation (Signed)
SBAR given to Raynald Kemp, Charity fundraiser. All questions answered to satisfaction.

## 2021-01-05 NOTE — ED Notes (Signed)
Patient denies pain and is resting comfortably.  

## 2021-01-05 NOTE — Transfer of Care (Signed)
Immediate Anesthesia Transfer of Care Note  Patient: Renee Benitez  Procedure(s) Performed: IR WITH ANESTHESIA  Patient Location: ICU  Anesthesia Type:General  Level of Consciousness: awake, alert  and oriented  Airway & Oxygen Therapy: Patient Spontanous Breathing  Post-op Assessment: Report given to RN, Post -op Vital signs reviewed and stable, Patient moving all extremities X 4 and Patient able to stick tongue midline  Post vital signs: Reviewed and stable  Last Vitals:  Vitals Value Taken Time  BP 132/99 01/05/21 1824  Temp    Pulse    Resp 16 01/05/21 1828  SpO2    Vitals shown include unvalidated device data.  Last Pain:  Vitals:   01/05/21 1355  TempSrc: Oral  PainSc:          Complications: No notable events documented.

## 2021-01-05 NOTE — Anesthesia Procedure Notes (Addendum)
Procedure Name: Intubation Date/Time: 01/05/2021 2:46 PM Performed by: Griffin Dakin, CRNA Pre-anesthesia Checklist: Patient identified, Emergency Drugs available, Suction available, Patient being monitored and Timeout performed Patient Re-evaluated:Patient Re-evaluated prior to induction Oxygen Delivery Method: Circle system utilized Preoxygenation: Pre-oxygenation with 100% oxygen Induction Type: IV induction Laryngoscope Size: Mac and 4 Grade View: Grade II Tube type: Oral Tube size: 7.5 mm Number of attempts: 1 Airway Equipment and Method: Stylet Placement Confirmation: ETT inserted through vocal cords under direct vision, positive ETCO2 and breath sounds checked- equal and bilateral Secured at: 22 cm Tube secured with: Tape Dental Injury: Teeth and Oropharynx as per pre-operative assessment

## 2021-01-05 NOTE — ED Provider Notes (Signed)
[facility] EMERGENCY DEPARTMENT Provider Note   CSN: [id number] Arrival date & time: 01/05/21  1206  LEVEL 5 CAVEAT - ACUITY OF CONDITION  History Chief Complaint  Patient presents with   Code Stroke    Renee Benitez is a 70 y.o. female.  HPI 70 year old female presents with acute left-sided weakness.  History is somewhat from patient but mostly from EMS.  Her last known well was reportedly 11:30 AM.  She was at the bank and when she got out she started having the symptoms.  Has left facial droop and left-sided weakness.  Flaccid per EMS.  Patient denies a headache but is stating that she is acutely dizzy when the symptoms started.  When asked, patient tells me that she did not take her Eliquis or morning meds this morning.  She typically does not forget.  Past Medical History:  Diagnosis Date   Atrial fibrillation [facility])    Current use of estrogen therapy 01/31/2013   Dysrhythmia    AFib   Hypertension    Macular degeneration    Menopausal syndrome    Right ovarian cyst 01/25/2014    Patient Active Problem List   Diagnosis Date Noted   Atypical atrial flutter (HCC) 07/31/2020   Secondary hypercoagulable state (HCC) 07/31/2020   Encounter for screening fecal occult blood testing 07/16/2020   Family history of colon cancer 01/17/2019   Esophageal dysphagia 06/06/2018   Well female exam with routine gynecological exam 04/25/2017   Urinary frequency 04/25/2017   History of ovarian cyst 04/25/2017   Persistent atrial fibrillation (HCC)    Right ovarian cyst 01/25/2014   Heme positive stool 02/26/2013   Current use of estrogen therapy 01/31/2013   Essential hypertension, benign 09/23/2008   Paroxysmal atrial fibrillation (HCC) 09/23/2008    Past Surgical History:  Procedure Laterality Date   ABDOMINAL HYSTERECTOMY     APPENDECTOMY     CARDIOVERSION N/A 09/14/2016   Procedure: CARDIOVERSION;  Surgeon: [name], MD;  Location: [facility] ORS;  Service:  Cardiovascular;  Laterality: N/A;   CATARACT EXTRACTION W/ INTRAOCULAR LENS  IMPLANT, BILATERAL Bilateral    COLONOSCOPY  12/04/03   Friable anal canal otherwise normal rectum and colon   COLONOSCOPY N/A 03/19/2013   Dr. [name]: diverticulosis, hemorrhoids. next TCS 10 years   COLONOSCOPY N/A 05/02/2019   Procedure: COLONOSCOPY;  Surgeon: [name], MD;  Location: [facility] ENDO SUITE;  Service: Endoscopy;  Laterality: N/A;  7:30am   ESOPHAGOGASTRODUODENOSCOPY N/A 10/17/2018   Procedure: ESOPHAGOGASTRODUODENOSCOPY (EGD);  Surgeon: [name], MD;  Location: [facility] END[facility];  Service: Endoscopy;  Laterality: N/A;  3:00pm   MALONEY DILATION N/A 10/17/2018   Procedure: Elease Hashimoto DILATION;  Surgeon: [name], MD;  Location: [facility];  Service: Endoscopy;  Laterality: N/A;   TEE WITHOUT CARDIOVERSION N/A 09/14/2016   Procedure: TRANSESOPHAGEAL ECHOCARDIOGRAM (TEE) WITH PROPOFOL;  Surgeon: [name], MD;  Location: [facility] ORS;  Service: Cardiovascular;  Laterality: N/A;   TONSILECTOMY, ADENOIDECTOMY, BILATERAL MYRINGOTOMY AND TUBES       OB History     Gravida  2   Para  2   Term  2   Preterm      AB      Living  2      SAB      IAB      Ectopic      Multiple      Live Births  2  Family History  Problem Relation Age of Onset   Coronary artery disease Father    Cancer Father        bladder cancer   Arrhythmia Sister        Reportedly had ablation of SVT   Hypertension Sister    Colon cancer Sister 5       s/p surgical resection, no chemo/radiation   Colon cancer Maternal Grandmother    Hypertension Son     Social History   Tobacco Use   Smoking status: Never   Smokeless tobacco: Never   Tobacco comments:    Never smoked  Vaping Use   Vaping Use: Never used  Substance Use Topics   Alcohol use: Yes    Alcohol/week: 1.0 standard drink    Types: 1 Glasses of wine per week    Comment: occ wine   Drug use: No    Home  Medications Prior to Admission medications   Medication Sig Start Date End Date Taking? Authorizing Provider  acetaminophen (TYLENOL) 500 MG tablet Take 1,000 mg by mouth every 6 (six) hours as needed for moderate pain or headache.    [provider]  Calcium Carb-Cholecalciferol (CALCIUM 600 + D PO) Take 1 tablet by mouth daily.    [provider]  Cholecalciferol (DIALYVITE VITAMIN D 5000) 125 MCG (5000 UT) capsule Take 5,000 Units by mouth daily.    [provider]  diltiazem (CARDIZEM) 60 MG tablet Take one tablet every 6 hours as needed for A-fib episodes    [provider]  diltiazem (CARTIA XT) 180 MG 24 hr capsule Take 1 capsule (180 mg total) by mouth daily. 08/07/20   Fenton, Clint R, PA  ELIQUIS 5 MG TABS tablet TAKE ONE TABLET (5MG  TOTAL) BY MOUTH TWOTIMES DAILY 09/26/20   Fenton, Clint R, PA  flecainide (TAMBOCOR) 100 MG tablet Take 1 tablet (100 mg total) by mouth 2 (two) times daily. 02/14/20   [name], MD  levothyroxine (SYNTHROID) 50 MCG tablet Take 50 mcg by mouth daily. 11/03/20   [provider]  loratadine (CLARITIN) 10 MG tablet Take 10 mg by mouth daily as needed for allergies.    [provider]  Magnesium 250 MG TABS Take 250 mg by mouth at bedtime.     [provider]  metoprolol tartrate (LOPRESSOR) 25 MG tablet Take 1 tablet by mouth every 8 hours as needed for breakthrough afib 08/07/20   Fenton, Clint R, PA  Multiple Vitamins-Minerals (MULTIVITAMIN WITH MINERALS) tablet Take 1 tablet by mouth daily.    [provider]  Multiple Vitamins-Minerals (PRESERVISION AREDS 2) CAPS Take 1 capsule by mouth 2 (two) times daily.    [provider]  Omega-3 Fatty Acids (FISH OIL ULTRA) 1400 MG CAPS Take 1,400 mg by mouth daily.    [provider]  pantoprazole (PROTONIX) 40 MG tablet TAKE ONE TABLET (40MG  TOTAL) BY MOUTH DAILY 01/17/20   Gelene Mink, NP  Simethicone (GAS-X PO) Take 1-2  tablets by mouth daily as needed (gas).    [provider]    Allergies    Patient has no known allergies.  Review of Systems   Review of Systems  Unable to perform ROS: Acuity of condition   Physical Exam Updated Vital Signs BP (!) 157/70 (BP Location: Right Arm)   Pulse 62   Temp (!) 97.5 F (36.4 C) (Oral)   Resp 15   Wt 93.4 kg   SpO2 100%  BMI 33.00 kg/m   Physical Exam Vitals and nursing note reviewed.  Constitutional:      Appearance: She is well-developed.  HENT:     Head: Normocephalic and atraumatic.     Right Ear: External ear normal.     Left Ear: External ear normal.     Nose: Nose normal.  Eyes:     General:        Right eye: No discharge.        Left eye: No discharge.  Cardiovascular:     Rate and Rhythm: Normal rate and regular rhythm.     Heart sounds: Normal heart sounds.  Pulmonary:     Effort: Pulmonary effort is normal.     Breath sounds: Normal breath sounds.  Abdominal:     Palpations: Abdomen is soft.     Tenderness: There is no abdominal tenderness.  Skin:    General: Skin is warm and dry.  Neurological:     Mental Status: She is alert and oriented to person, place, and time.     Comments: Awake and alert.  She has left-sided facial droop.  Minimal grip strength on the left side but otherwise cannot hold her arm up against gravity.  Left leg also cannot be lifted up off the stretcher.  Right leg might be a little bit weak but is able to be moved pretty easily by patient.  Normal right arm strength.  Psychiatric:        Mood and Affect: Mood is not anxious.    ED Results / Procedures / Treatments   Labs (all labs ordered are listed, but only abnormal results are displayed) Labs Reviewed  RESP PANEL BY RT-PCR (FLU A&B, COVID) ARPGX2  PROTIME-INR  APTT  CBC  DIFFERENTIAL  COMPREHENSIVE METABOLIC PANEL  RAPID URINE DRUG SCREEN, HOSP PERFORMED  URINALYSIS, ROUTINE W REFLEX MICROSCOPIC  ETHANOL  CBG MONITORING, ED   I-STAT CHEM 8, ED    EKG None  Radiology CT HEAD CODE STROKE WO CONTRAST  Result Date: 01/05/2021 CLINICAL DATA:  Code stroke.  Neuro deficit, acute, stroke suspected EXAM: CT HEAD WITHOUT CONTRAST TECHNIQUE: Contiguous axial images were obtained from the base of the skull through the vertex without intravenous contrast. COMPARISON:  None. FINDINGS: Brain: There is no acute intracranial hemorrhage, mass effect, or edema. Gray-white differentiation is preserved. Age-indeterminate but likely chronic small left cerebellar infarcts. Questionable small area of ill-defined low-density involving anterior limb of right internal capsule. Ventricles and sulci are within normal limits in size and configuration. No extra-axial collection. Vascular: No hyperdense vessel. Skull: Unremarkable. Sinuses/Orbits: No acute abnormality. Other: Mastoid air cells are clear. ASPECTS ([place] Stroke Program Early CT Score) - Ganglionic level infarction (caudate, lentiform nuclei, internal capsule, insula, M1-M3 cortex): 7 - Supraganglionic infarction (M4-M6 cortex): 3 Total score (0-10 with 10 being normal): 10 IMPRESSION: There is no acute intracranial hemorrhage or definite evidence of acute infarction. ASPECT score is 10. Questionable small area of low density involving anterior limb of right internal capsule could reflect chronic microvascular ischemic change or age-indeterminate infarct. Age-indeterminate but likely chronic small left cerebellar infarcts. Initial results were called by telephone at the time of interpretation on 01/05/2021 at 12:23 pm to provider Pricilla Loveless , who verbally acknowledged these results. Electronically Signed   By: Guadlupe Spanish M.D.   On: 01/05/2021 12:26   CT ANGIO HEAD NECK W WO CM W PERF (CODE STROKE)  Result Date: 01/05/2021 CLINICAL DATA:  Neuro deficit, acute, stroke suspected EXAM:  CT ANGIOGRAPHY HEAD AND NECK CT PERFUSION BRAIN TECHNIQUE: Multidetector CT imaging of the head and  neck was performed using the standard protocol during bolus administration of intravenous contrast. Multiplanar CT image reconstructions and MIPs were obtained to evaluate the vascular anatomy. Carotid stenosis measurements (when applicable) are obtained utilizing NASCET criteria, using the distal internal carotid diameter as the denominator. Multiphase CT imaging of the brain was performed following IV bolus contrast injection. Subsequent parametric perfusion maps were calculated using RAPID software. CONTRAST:  9mL OMNIPAQUE IOHEXOL 350 MG/ML SOLN COMPARISON:  None. FINDINGS: CTA NECK Aortic arch: Unremarkable with patent great vessel origins. Right carotid system: Patent. Minimal calcified plaque at the ICA origin without stenosis. Left carotid system: Patent. Mild mixed plaque at the ICA origin without stenosis. Vertebral arteries: Patent.  No stenosis. Skeleton: Cervical spine degenerative changes with left facet predominant degeneration. Other neck: Unremarkable. Upper chest: No apical lung mass. Review of the MIP images confirms the above findings CTA HEAD Anterior circulation: Intracranial internal carotid arteries are patent with calcified plaque causing mild stenosis. Anterior and middle cerebral arteries are patent. Posterior circulation: Intracranial vertebral arteries are patent. Basilar artery is patent. Major cerebellar artery origins are patent. The proximal right P1 PCA is patent. There is subsequent occlusion to the level of the proximal P2 PCA where there is partial reconstitution. Venous sinuses: Patent as allowed by contrast bolus timing. Review of the MIP images confirms the above findings CT Brain Perfusion Findings: CBF (<30%) Volume: 48mL Perfusion (Tmax>6.0s) volume: 78mL Mismatch Volume: 27mL Infarction Location: Right occipital lobe. IMPRESSION: Occlusion of the right PCA just beyond the proximal P1 segment. There is subsequent partial reconstitution at the level of the proximal right P2  segment. Perfusion imaging demonstrates 6 mL of penumbra within the right occipital lobe. No hemodynamically significant stenosis in the neck. These results were communicated to Dr. Selina Cooley at 1:24 pm on 01/05/2021 by text page via the [facility] messaging system. Electronically Signed   By: Guadlupe Spanish M.D.   On: 01/05/2021 13:32    Procedures .Critical Care Performed by: Pricilla Loveless, MD Authorized by: Pricilla Loveless, MD   Critical care provider statement:    Critical care time (minutes):  35   Critical care time was exclusive of:  Separately billable procedures and treating other patients   Critical care was necessary to treat or prevent imminent or life-threatening deterioration of the following conditions:  CNS failure or compromise   Critical care was time spent personally by me on the following activities:  Discussions with consultants, evaluation of patient's response to treatment, examination of patient, ordering and performing treatments and interventions, ordering and review of laboratory studies, ordering and review of radiographic studies, pulse oximetry, re-evaluation of patient's condition, obtaining history from patient or surrogate and review of old charts   Medications Ordered in ED Medications  iohexol (OMNIPAQUE) 350 MG/ML injection 75 mL (75 mLs Intravenous Contrast Given 01/05/21 1255)    ED Course  I have reviewed the triage vital signs and the nursing notes.  Pertinent labs & imaging results that were available during my care of the patient were reviewed by me and considered in my medical decision making (see chart for details).    MDM Rules/Calculators/A&P                           Patient presents as a code stroke.  Seems to be neglecting a little bit to the left though is hard  to tell.  Otherwise she is protecting her airway.  CT head is unremarkable.  Not a candidate for thrombolytics based on recent eloquence use.  However a CT angiography obtained after a rapid  neuro consult does show a right PCA occlusion.  Her symptoms have waxed and waned but she still having symptoms and thus is a candidate to go get interventional radiology intervention.  Dr. Selina Cooley has discussed with neuro and neuroradiology down at [facility].  I did make the EDP at [facility], Dr. Posey Rea, aware of patient but she should go straight to IR.  She will be transferred emergently via [facility]. Final Clinical Impression(s) / ED Diagnoses Final diagnoses:  Arterial ischemic stroke, PCA (posterior cerebral artery), right, acute [facility])    Rx / DC Orders ED Discharge Orders     None        Pricilla Loveless, MD 01/05/21 1401

## 2021-01-05 NOTE — ED Notes (Signed)
To CT

## 2021-01-05 NOTE — Code Documentation (Addendum)
Pt arrived [facility] as transfer from [facility] via [facility]. SRN and Neurologist accompanied pt from bridge to IR, arriving at 1435. Pt for NIR for RT PCA occlusion. She was last known well this morning at 1130. She was not eligible for thrombolytic due to Eliquis. Pt NIH 10, Left hemiplegia slurred speech, rt gaze. See flowsheet for details. Stable en route per [facility]. Handoff with IR staff complete.

## 2021-01-05 NOTE — ED Notes (Signed)
[facility] en route to transport pt to [facility] and received report.

## 2021-01-05 NOTE — Sedation Documentation (Signed)
Pt transported to 2M10 via bed accompanied by RN and CRNA. Don RN at bedside to receive pt. Handoff complete. Left radial site remains level 0 with TR band in place. Pt awake alert and oriented and able to follow commands. No s/s of distress at this time.

## 2021-01-06 ENCOUNTER — Inpatient Hospital Stay (HOSPITAL_COMMUNITY): Payer: Medicare Other

## 2021-01-06 ENCOUNTER — Encounter (HOSPITAL_COMMUNITY): Payer: Self-pay | Admitting: Radiology

## 2021-01-06 DIAGNOSIS — I6389 Other cerebral infarction: Secondary | ICD-10-CM

## 2021-01-06 DIAGNOSIS — I63431 Cerebral infarction due to embolism of right posterior cerebral artery: Principal | ICD-10-CM

## 2021-01-06 LAB — ECHOCARDIOGRAM COMPLETE
AR max vel: 3.36 cm2
AV Area VTI: 2.95 cm2
AV Area mean vel: 3.12 cm2
AV Mean grad: 3 mmHg
AV Peak grad: 5.1 mmHg
Ao pk vel: 1.13 m/s
Area-P 1/2: 2.48 cm2
MV VTI: 2.05 cm2
P 1/2 time: 373 msec
S' Lateral: 2.3 cm
Weight: 3296 oz

## 2021-01-06 LAB — LIPID PANEL
Cholesterol: 196 mg/dL (ref 0–200)
HDL: 53 mg/dL (ref >40)
LDL Cholesterol: 131 mg/dL — ABNORMAL HIGH (ref 0–99)
Total CHOL/HDL Ratio: 3.7 RATIO
Triglycerides: 60 mg/dL (ref <150)
VLDL: 12 mg/dL (ref 0–40)

## 2021-01-06 LAB — HEMOGLOBIN A1C
Hgb A1c MFr Bld: 5.5 % (ref 4.8–5.6)
Mean Plasma Glucose: 111.15 mg/dL

## 2021-01-06 LAB — GLUCOSE, CAPILLARY
Glucose-Capillary: 122 mg/dL — ABNORMAL HIGH (ref 70–99)
Glucose-Capillary: 129 mg/dL — ABNORMAL HIGH (ref 70–99)
Glucose-Capillary: 87 mg/dL (ref 70–99)

## 2021-01-06 LAB — HIV ANTIBODY (ROUTINE TESTING W REFLEX): HIV Screen 4th Generation wRfx: NONREACTIVE

## 2021-01-06 MED ORDER — ATORVASTATIN CALCIUM 40 MG PO TABS
40.0000 mg | ORAL_TABLET | Freq: Every day | ORAL | Status: DC
  Administered 2021-01-06 – 2021-01-07 (×2): 40 mg via ORAL
  Filled 2021-01-06 (×2): qty 1

## 2021-01-06 MED ORDER — HYDRALAZINE HCL 20 MG/ML IJ SOLN
20.0000 mg | INTRAMUSCULAR | Status: AC | PRN
  Administered 2021-01-06 (×2): 20 mg via INTRAVENOUS
  Filled 2021-01-06 (×2): qty 1

## 2021-01-06 MED ORDER — CALCIUM CARBONATE-VITAMIN D 500-200 MG-UNIT PO TABS
1.0000 | ORAL_TABLET | Freq: Every day | ORAL | Status: DC
  Administered 2021-01-07: 1 via ORAL
  Filled 2021-01-06 (×2): qty 1

## 2021-01-06 MED ORDER — DILTIAZEM HCL ER COATED BEADS 120 MG PO CP24: 120.0000 mg | ORAL_CAPSULE | Freq: Every day | ORAL | Status: DC

## 2021-01-06 MED ORDER — PANTOPRAZOLE SODIUM 40 MG PO TBEC
40.0000 mg | DELAYED_RELEASE_TABLET | Freq: Every day | ORAL | Status: DC
  Administered 2021-01-06: 40 mg via ORAL
  Filled 2021-01-06: qty 1

## 2021-01-06 MED ORDER — APIXABAN 5 MG PO TABS
5.0000 mg | ORAL_TABLET | Freq: Two times a day (BID) | ORAL | Status: DC
  Administered 2021-01-06 – 2021-01-07 (×2): 5 mg via ORAL
  Filled 2021-01-06 (×2): qty 1

## 2021-01-06 MED ORDER — FLECAINIDE ACETATE 100 MG PO TABS
100.0000 mg | ORAL_TABLET | Freq: Two times a day (BID) | ORAL | Status: DC
  Administered 2021-01-06 – 2021-01-07 (×3): 100 mg via ORAL
  Filled 2021-01-06 (×4): qty 1

## 2021-01-06 MED ORDER — DILTIAZEM HCL ER COATED BEADS 120 MG PO CP24
120.0000 mg | ORAL_CAPSULE | Freq: Every day | ORAL | Status: DC
  Administered 2021-01-06 – 2021-01-07 (×2): 120 mg via ORAL
  Filled 2021-01-06 (×2): qty 1

## 2021-01-06 MED ORDER — MAGNESIUM OXIDE -MG SUPPLEMENT 400 (240 MG) MG PO TABS
200.0000 mg | ORAL_TABLET | Freq: Every day | ORAL | Status: DC
  Administered 2021-01-06: 200 mg via ORAL
  Filled 2021-01-06 (×2): qty 0.5

## 2021-01-06 MED ORDER — CHLORHEXIDINE GLUCONATE CLOTH 2 % EX PADS
6.0000 | MEDICATED_PAD | Freq: Every day | CUTANEOUS | Status: DC
  Administered 2021-01-06: 6 via TOPICAL

## 2021-01-06 MED ORDER — LABETALOL HCL 5 MG/ML IV SOLN: 20.0000 mg | INTRAVENOUS | Status: AC | PRN

## 2021-01-06 MED ORDER — ADULT MULTIVITAMIN W/MINERALS CH
1.0000 | ORAL_TABLET | Freq: Every day | ORAL | Status: DC
  Administered 2021-01-06 – 2021-01-07 (×2): 1 via ORAL
  Filled 2021-01-06 (×3): qty 1

## 2021-01-06 MED ORDER — APIXABAN 5 MG PO TABS: 5.0000 mg | ORAL_TABLET | Freq: Two times a day (BID) | ORAL | Status: DC

## 2021-01-06 MED ORDER — DILTIAZEM HCL ER COATED BEADS 180 MG PO CP24
180.0000 mg | ORAL_CAPSULE | Freq: Every day | ORAL | Status: DC
  Filled 2021-01-06: qty 1

## 2021-01-06 NOTE — Plan of Care (Signed)
  Problem: Ischemic Stroke/TIA Tissue Perfusion: Goal: Complications of ischemic stroke/TIA will be minimized 01/06/2021 0344 by Ignacia Felling, RN Outcome: Progressing 01/06/2021 0343 by Ignacia Felling, RN Outcome: Progressing   Problem: Education: Goal: Knowledge of disease or condition will improve 01/06/2021 0344 by Ignacia Felling, RN Outcome: Progressing 01/06/2021 0343 by Ignacia Felling, RN Outcome: Progressing   Problem: Education: Goal: Knowledge of secondary prevention will improve 01/06/2021 0344 by Ignacia Felling, RN Outcome: Progressing 01/06/2021 0343 by Ignacia Felling, RN Outcome: Progressing   Problem: Education: Goal: Individualized Educational Video(s) 01/06/2021 0344 by Ignacia Felling, RN Outcome: Progressing 01/06/2021 0343 by Ignacia Felling, RN Outcome: Progressing   Problem: Education: Goal: Knowledge of patient specific risk factors addressed and post discharge goals established will improve Outcome: Progressing

## 2021-01-06 NOTE — Progress Notes (Signed)
Referring Physician(s): Code stroke  Supervising Physician: [name]  Patient Status:  [place] - In-pt  Chief Complaint: Follow up right P1/PCA thrombectomy, stent placement and in stent angioplasty in NIR 01/05/21  Subjective:  Patient sitting up in chair, husband and daughter at bedside. She has a mild headache that she does not want Tylenol for but otherwise feels "good as new." She is happy that she passed her swallow evaluation earlier and was able to have some graham crackers. She is looking forward to going home and getting back to riding her e-bike and going kayaking. NIR procedure and MRI reviewed with family by Dr Tommi Rumps Melchor Amour today.  Allergies: Patient has no known allergies.  Medications: Prior to Admission medications   Medication Sig Start Date End Date Taking? Authorizing Provider  acetaminophen (TYLENOL) 500 MG tablet Take 1,000 mg by mouth every 6 (six) hours as needed for moderate pain or headache.   Yes [provider]  Calcium Carb-Cholecalciferol (CALCIUM 600 + D PO) Take 1 tablet by mouth daily.   Yes [provider]  Cholecalciferol (DIALYVITE VITAMIN D 5000) 125 MCG (5000 UT) capsule Take 5,000 Units by mouth daily.   Yes [provider]  diltiazem (CARDIZEM) 60 MG tablet Take 60 mg by mouth daily as needed (afib).   Yes [provider]  diltiazem (CARTIA XT) 180 MG 24 hr capsule Take 1 capsule (180 mg total) by mouth daily. 08/07/20  Yes Fenton, Clint R, PA  ELIQUIS 5 MG TABS tablet TAKE ONE TABLET (5MG  TOTAL) BY MOUTH TWOTIMES DAILY Patient taking differently: Take 5 mg by mouth 2 (two) times daily. 09/26/20  Yes Fenton, Clint R, PA  flecainide (TAMBOCOR) 100 MG tablet Take 1 tablet (100 mg total) by mouth 2 (two) times daily. 02/14/20  Yes 13/4/21, MD  levothyroxine (SYNTHROID) 50 MCG tablet Take 50 mcg by mouth daily. 11/03/20  Yes [provider]  Magnesium 250 MG TABS Take 250 mg by  mouth at bedtime.    Yes [provider]  metoprolol tartrate (LOPRESSOR) 25 MG tablet Take 1 tablet by mouth every 8 hours as needed for breakthrough afib Patient taking differently: Take 25 mg by mouth daily as needed (afib). 08/07/20  Yes Fenton, Clint R, PA  Multiple Vitamins-Minerals (MULTIVITAMIN WITH MINERALS) tablet Take 1 tablet by mouth daily.   Yes [provider]  Multiple Vitamins-Minerals (PRESERVISION AREDS 2) CAPS Take 1 capsule by mouth 2 (two) times daily.   Yes [provider]  Omega-3 Fatty Acids (FISH OIL ULTRA) 1400 MG CAPS Take 1,400 mg by mouth daily.   Yes [provider]  pantoprazole (PROTONIX) 40 MG tablet TAKE ONE TABLET (40MG  TOTAL) BY MOUTH DAILY Patient taking differently: Take 40 mg by mouth daily. 01/17/20  Yes , NP     Vital Signs: BP (!) 142/66   Pulse 63   Temp 98.1 F (36.7 C) (Oral)   Resp 19   Wt 206 lb (93.4 kg)   SpO2 95%   BMI 33.00 kg/m   Physical Exam Vitals and nursing note reviewed.  Constitutional:      General: She is not in acute distress. HENT:     Head: Normocephalic.  Cardiovascular:     Rate and Rhythm: Normal rate.     Comments: (+) left radial puncture site clean, dry, dressed appropriately without bleeding or drainage. Soft, non tender, non pulsatile, palpable radial pulse. Pulmonary:     Effort:  Pulmonary effort is normal.  Skin:    General: Skin is warm and dry.  Neurological:     General: No focal deficit present.     Mental Status: She is alert and oriented to person, place, and time.  Psychiatric:        Mood and Affect: Mood normal.        Behavior: Behavior normal.        Thought Content: Thought content normal.        Judgment: Judgment normal.    Imaging: MR BRAIN WO CONTRAST  Result Date: 01/05/2021 CLINICAL DATA:  Left-sided weakness, status post endovascular intervention. EXAM: MRI HEAD WITHOUT CONTRAST TECHNIQUE: Multiplanar, multiecho pulse sequences of  the brain and surrounding structures were obtained without intravenous contrast. COMPARISON:  None. FINDINGS: Brain: There are multiple small foci of acute/early subacute ischemia within both hemispheres. The most notable lesions are in the right basal ganglia and thalamus. There also punctate lesions in the left parietal lobe and right cerebellum. No acute or chronic hemorrhage. Old left cerebellar infarct. Mild findings of chronic small vessel disease. The midline structures are normal. Vascular: Focus of magnetic susceptibility effects at the right PCA P1 segment may a stent. Skull and upper cervical spine: Normal calvarium and skull base. Visualized upper cervical spine and soft tissues are normal. Sinuses/Orbits:No paranasal sinus fluid levels or advanced mucosal thickening. No mastoid or middle ear effusion. Normal orbits. IMPRESSION: 1. Multiple small foci of acute/early subacute ischemia within both hemispheres, with largest lesions in the right basal ganglia and thalamus. No hemorrhage or mass effect. 2. Old left cerebellar infarct and mild findings of chronic small vessel disease. Electronically Signed   By: Deatra Robinson M.D.   On: 01/05/2021 23:33   CT HEAD CODE STROKE WO CONTRAST  Result Date: 01/05/2021 CLINICAL DATA:  Code stroke.  Neuro deficit, acute, stroke suspected EXAM: CT HEAD WITHOUT CONTRAST TECHNIQUE: Contiguous axial images were obtained from the base of the skull through the vertex without intravenous contrast. COMPARISON:  None. FINDINGS: Brain: There is no acute intracranial hemorrhage, mass effect, or edema. Gray-white differentiation is preserved. Age-indeterminate but likely chronic small left cerebellar infarcts. Questionable small area of ill-defined low-density involving anterior limb of right internal capsule. Ventricles and sulci are within normal limits in size and configuration. No extra-axial collection. Vascular: No hyperdense vessel. Skull: Unremarkable. Sinuses/Orbits:  No acute abnormality. Other: Mastoid air cells are clear. ASPECTS (Alberta Stroke Program Early CT Score) - Ganglionic level infarction (caudate, lentiform nuclei, internal capsule, insula, M1-M3 cortex): 7 - Supraganglionic infarction (M4-M6 cortex): 3 Total score (0-10 with 10 being normal): 10 IMPRESSION: There is no acute intracranial hemorrhage or definite evidence of acute infarction. ASPECT score is 10. Questionable small area of low density involving anterior limb of right internal capsule could reflect chronic microvascular ischemic change or age-indeterminate infarct. Age-indeterminate but likely chronic small left cerebellar infarcts. Initial results were called by telephone at the time of interpretation on 01/05/2021 at 12:23 pm to provider Pricilla Loveless , who verbally acknowledged these results. Electronically Signed   By: Guadlupe Spanish M.D.   On: 01/05/2021 12:26   CT ANGIO HEAD NECK W WO CM W PERF (CODE STROKE)  Result Date: 01/05/2021 CLINICAL DATA:  Neuro deficit, acute, stroke suspected EXAM: CT ANGIOGRAPHY HEAD AND NECK CT PERFUSION BRAIN TECHNIQUE: Multidetector CT imaging of the head and neck was performed using the standard protocol during bolus administration of intravenous contrast. Multiplanar CT image reconstructions and MIPs were obtained to  evaluate the vascular anatomy. Carotid stenosis measurements (when applicable) are obtained utilizing NASCET criteria, using the distal internal carotid diameter as the denominator. Multiphase CT imaging of the brain was performed following IV bolus contrast injection. Subsequent parametric perfusion maps were calculated using RAPID software. CONTRAST:  51mL OMNIPAQUE IOHEXOL 350 MG/ML SOLN COMPARISON:  None. FINDINGS: CTA NECK Aortic arch: Unremarkable with patent great vessel origins. Right carotid system: Patent. Minimal calcified plaque at the ICA origin without stenosis. Left carotid system: Patent. Mild mixed plaque at the ICA origin without  stenosis. Vertebral arteries: Patent.  No stenosis. Skeleton: Cervical spine degenerative changes with left facet predominant degeneration. Other neck: Unremarkable. Upper chest: No apical lung mass. Review of the MIP images confirms the above findings CTA HEAD Anterior circulation: Intracranial internal carotid arteries are patent with calcified plaque causing mild stenosis. Anterior and middle cerebral arteries are patent. Posterior circulation: Intracranial vertebral arteries are patent. Basilar artery is patent. Major cerebellar artery origins are patent. The proximal right P1 PCA is patent. There is subsequent occlusion to the level of the proximal P2 PCA where there is partial reconstitution. Venous sinuses: Patent as allowed by contrast bolus timing. Review of the MIP images confirms the above findings CT Brain Perfusion Findings: CBF (<30%) Volume: 29mL Perfusion (Tmax>6.0s) volume: 20mL Mismatch Volume: 70mL Infarction Location: Right occipital lobe. IMPRESSION: Occlusion of the right PCA just beyond the proximal P1 segment. There is subsequent partial reconstitution at the level of the proximal right P2 segment. Perfusion imaging demonstrates 6 mL of penumbra within the right occipital lobe. No hemodynamically significant stenosis in the neck. These results were communicated to Dr. Selina Cooley at 1:24 pm on 01/05/2021 by text page via the [facility] messaging system. Electronically Signed   By: Guadlupe Spanish M.D.   On: 01/05/2021 13:32    Labs:  CBC: Recent Labs    07/31/20 1122 01/05/21 1206 01/05/21 1222  WBC 9.3 6.1  --   HGB 14.8 14.1 13.9  HCT 46.1* 42.0 41.0  PLT 318 226  --     COAGS: Recent Labs    01/05/21 1206  INR 1.2  APTT 29    BMP: Recent Labs    07/31/20 1122 01/05/21 1206 01/05/21 1222  NA 138 137 142  K 4.4 4.1 4.4  CL 106 105 106  CO2 25 25  --   GLUCOSE 95 89 92  BUN 20 17 16   CALCIUM 9.6 9.2  --   CREATININE 0.88 0.85 0.90  GFRNONAA >60 >60  --     LIVER  FUNCTION TESTS: Recent Labs    01/05/21 1206  BILITOT 0.6  AST 29  ALT 34  ALKPHOS 73  PROT 6.9  ALBUMIN 3.9    Assessment and Plan:  70 y/o F who presented to [facility] ED yesterday with acute onset of left sided weakness and was found to have a large vessel occlusion of the right P1/PCA. She underwent emergent P1/PCA mechanical thrombectomy with stent placement and in stent angioplasty that same day with Dr 73 Tommi Rumps and is seen today for post procedure follow up.  Patient reports feeling very good, mild headache but no other neurologic deficits notes. Left radial puncture site unremarkable, soft, palpable pulse.   Discussed continuing Eliquis with likely need for addition of ASA at discharge which is to be continued at least until she returns to [facility] in 6 months for repeat angiogram - will discuss plans for medications with neurology prior to discharge.  No further NIR  needs at this time, further plans per neurology/primary team - NIR scheduler will call patient to set up cerebral angiogram in 6 months however the patient was encouraged to call our office with any questions or concerns prior to this appointment.  NIR remains available as needed, please call with questions or concerns.  Electronically Signed: [name], PA-C 01/06/2021, 10:15 AM   I spent a total of 15 Minutes at the the patient's bedside AND on the patient's hospital floor or unit, greater than 50% of which was counseling/coordinating care for P1/PCA thrombectomy/stent placement follow up.

## 2021-01-06 NOTE — Evaluation (Signed)
Occupational Therapy Evaluation Patient Details Name: Renee Benitez MRN: [id number] DOB: [birth year] Today's Date: 01/06/2021   History of Present Illness 70 y.o. female presenting to [facility] ED 9/26 with L sided weakness, slurred speech and R gaze preference. CT angio with large vessel occlusion of R P1. Transferred to [facility] for mechanical thrombectomy. PMHx significant for A-fib on anticoagulation, HTN and macular degeneration.   Clinical Impression   PTA patient was living with her spouse in a private residence and was grossly I with ADLs/IADLs. 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 At time of admission, patient with L sides weakness, slurred speech and R gaze. S/p thrombectomy patient functioning at baseline without residual deficits demonstrating observed ADLs including LB dressing, 3/3 parts of toileting, grooming standing at sink level, and functional mobility 232ft+ without AD with I. Sensation and coordination intact. Patient does not require continued acute occupational therapy services with OT to sign off at this time.       Recommendations for follow up therapy are one component of a multi-disciplinary discharge planning process, led by the attending physician.  Recommendations may be updated based on patient status, additional functional criteria and insurance authorization.   Follow Up Recommendations  No OT follow up    Equipment Recommendations  None recommended by OT    Recommendations for Other Services       Precautions / Restrictions Precautions Precautions: None Restrictions Weight Bearing Restrictions: No      Mobility Bed Mobility Overal bed mobility: Independent                  Transfers Overall transfer level: Independent Equipment used: None                  Balance Overall balance assessment: No apparent balance deficits (not formally assessed)                                         ADL  either performed or assessed with clinical judgement   ADL Overall ADL's : Needs assistance/impaired     Grooming: Wash/dry hands;Oral care;Brushing hair;Independent;Standing   Upper Body Bathing: Independent   Lower Body Bathing: Independent   Upper Body Dressing : Independent Upper Body Dressing Details (indicate cue type and reason): Donned posterior hosptial gown seated EOB. Lower Body Dressing: Independent Lower Body Dressing Details (indicate cue type and reason): Able to don footwear and mesh underwear without external assist. Toilet Transfer: Independent   Toileting- Clothing Manipulation and Hygiene: Independent Toileting - Clothing Manipulation Details (indicate cue type and reason): Able to complete 3/3 parts of toileting task with I.             Vision Baseline Vision/History: 1 Wears glasses (Readers) Ability to See in Adequate Light: 0 Adequate Patient Visual Report: No change from baseline Vision Assessment?: No apparent visual deficits Additional Comments: Able to correctly identify time on wall clock; vision appears [facility] with formal assessment.     Perception     Praxis Praxis Praxis tested?: Within functional limits    Pertinent Vitals/Pain Pain Assessment: 0-10 Pain Score: 3  Pain Location: Headache Pain Intervention(s): Monitored during session     Hand Dominance Right   Extremity/Trunk Assessment Upper Extremity Assessment Upper Extremity Assessment: Overall WFL for tasks assessed   Lower Extremity Assessment Lower Extremity Assessment: Overall WFL for tasks assessed   Cervical /  Trunk Assessment Cervical / Trunk Assessment: Normal   Communication Communication Communication: No difficulties   Cognition Arousal/Alertness: Awake/alert Behavior During Therapy: WFL for tasks assessed/performed Overall Cognitive Status: Within Functional Limits for tasks assessed                                     General Comments  VSS  on RA.    Exercises     Shoulder Instructions      Home Living Family/patient expects to be discharged to:: Private residence Living Arrangements: Spouse/significant other Available Help at Discharge: Family Type of Home: House Home Access: Stairs to enter Secretary/administrator of Steps: 2 Entrance Stairs-Rails: None Home Layout: One level     Bathroom Shower/Tub: Producer, television/film/video: Handicapped height     Home Equipment: Environmental consultant - 2 wheels;Cane - single point;Bedside commode          Prior Functioning/Environment Level of Independence: Independent        Comments: I with ADLs/IADLs; 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        OT Problem List:        OT Treatment/Interventions:      OT Goals(Current goals can be found in the care plan section) Acute Rehab OT Goals Patient Stated Goal: To return home. OT Goal Formulation: With patient Time For Goal Achievement: 01/20/21 Potential to Achieve Goals: Good  OT Frequency:     Barriers to D/C:            Co-evaluation              AM-PAC OT "6 Clicks" Daily Activity     Outcome Measure Help from another person eating meals?: None Help from another person taking care of personal grooming?: None Help from another person toileting, which includes using toliet, bedpan, or urinal?: None Help from another person bathing (including washing, rinsing, drying)?: None Help from another person to put on and taking off regular upper body clothing?: None Help from another person to put on and taking off regular lower body clothing?: None 6 Click Score: 24   End of Session Equipment Utilized During Treatment: Gait belt Nurse Communication: Mobility status  Activity Tolerance: Patient tolerated treatment well Patient left: in chair;with call bell/phone within reach;with chair alarm set  OT Visit Diagnosis: Muscle weakness (generalized) (M62.81)                Time: 7371-0626 OT Time  Calculation (min): 33 min Charges:  OT General Charges $OT Visit: 1 Visit OT Evaluation $OT Eval Low Complexity: 1 Low OT Treatments $Self Care/Home Management : 8-22 mins  Eisa Necaise H. OTR/L Supplemental OT, Department of rehab services [phone]  Ellysa Parrack R H. 01/06/2021, 9:01 AM

## 2021-01-06 NOTE — Progress Notes (Addendum)
STROKE TEAM PROGRESS NOTE   INTERVAL HISTORY Her husband and daughter are at the bedside.  Patient in bed getting ECHO. Patient has been OOB and walking mostly unassisted. States that she is ready to go home. Per family she is back to baseline.  MRI scan shows small bilateral infarcts with the largest 1 in the right thalamus and basal ganglia.  Old left cerebellar infarct. Patient neurological exam is nonfocal with practically no deficits.  Blood pressure adequately controlled.  She does take Eliquis for A. fib but had missed taking her yesterday morning dose Vitals:   01/06/21 1029 01/06/21 1032 01/06/21 1100 01/06/21 1134  BP: (!) 154/74 (!) 154/74 (!) 158/80 (!) 129/58  Pulse:  65 71 90  Resp:  18 19 (!) 25  Temp:      TempSrc:      SpO2:  99% 98% 97%  Weight:       CBC:  Recent Labs  Lab 01/05/21 1206 01/05/21 1222  WBC 6.1  --   NEUTROABS 2.8  --   HGB 14.1 13.9  HCT 42.0 41.0  MCV 92.7  --   PLT 226  --    Basic Metabolic Panel:  Recent Labs  Lab 01/05/21 1206 01/05/21 1222  NA 137 142  K 4.1 4.4  CL 105 106  CO2 25  --   GLUCOSE 89 92  BUN 17 16  CREATININE 0.85 0.90  CALCIUM 9.2  --    Lipid Panel:  Recent Labs  Lab 01/06/21 0433  CHOL 196  TRIG 60  HDL 53  CHOLHDL 3.7  VLDL 12  LDLCALC 856*   HgbA1c:  Recent Labs  Lab 01/06/21 0433  HGBA1C 5.5    IMAGING past 24 hours MR BRAIN WO CONTRAST  Result Date: 01/05/2021 CLINICAL DATA:  Left-sided weakness, status post endovascular intervention. EXAM: MRI HEAD WITHOUT CONTRAST TECHNIQUE: Multiplanar, multiecho pulse sequences of the brain and surrounding structures were obtained without intravenous contrast. COMPARISON:  None. FINDINGS: Brain: There are multiple small foci of acute/early subacute ischemia within both hemispheres. The most notable lesions are in the right basal ganglia and thalamus. There also punctate lesions in the left parietal lobe and right cerebellum. No acute or chronic  hemorrhage. Old left cerebellar infarct. Mild findings of chronic small vessel disease. The midline structures are normal. Vascular: Focus of magnetic susceptibility effects at the right PCA P1 segment may a stent. Skull and upper cervical spine: Normal calvarium and skull base. Visualized upper cervical spine and soft tissues are normal. Sinuses/Orbits:No paranasal sinus fluid levels or advanced mucosal thickening. No mastoid or middle ear effusion. Normal orbits. IMPRESSION: 1. Multiple small foci of acute/early subacute ischemia within both hemispheres, with largest lesions in the right basal ganglia and thalamus. No hemorrhage or mass effect. 2. Old left cerebellar infarct and mild findings of chronic small vessel disease. Electronically Signed   By: Deatra Robinson M.D.   On: 01/05/2021 23:33   CT ANGIO HEAD NECK W WO CM W PERF (CODE STROKE)  Result Date: 01/05/2021 CLINICAL DATA:  Neuro deficit, acute, stroke suspected EXAM: CT ANGIOGRAPHY HEAD AND NECK CT PERFUSION BRAIN TECHNIQUE: Multidetector CT imaging of the head and neck was performed using the standard protocol during bolus administration of intravenous contrast. Multiplanar CT image reconstructions and MIPs were obtained to evaluate the vascular anatomy. Carotid stenosis measurements (when applicable) are obtained utilizing NASCET criteria, using the distal internal carotid diameter as the denominator. Multiphase CT imaging of the brain was performed  following IV bolus contrast injection. Subsequent parametric perfusion maps were calculated using RAPID software. CONTRAST:  34mL OMNIPAQUE IOHEXOL 350 MG/ML SOLN COMPARISON:  None. FINDINGS: CTA NECK Aortic arch: Unremarkable with patent great vessel origins. Right carotid system: Patent. Minimal calcified plaque at the ICA origin without stenosis. Left carotid system: Patent. Mild mixed plaque at the ICA origin without stenosis. Vertebral arteries: Patent.  No stenosis. Skeleton: Cervical spine  degenerative changes with left facet predominant degeneration. Other neck: Unremarkable. Upper chest: No apical lung mass. Review of the MIP images confirms the above findings CTA HEAD Anterior circulation: Intracranial internal carotid arteries are patent with calcified plaque causing mild stenosis. Anterior and middle cerebral arteries are patent. Posterior circulation: Intracranial vertebral arteries are patent. Basilar artery is patent. Major cerebellar artery origins are patent. The proximal right P1 PCA is patent. There is subsequent occlusion to the level of the proximal P2 PCA where there is partial reconstitution. Venous sinuses: Patent as allowed by contrast bolus timing. Review of the MIP images confirms the above findings CT Brain Perfusion Findings: CBF (<30%) Volume: 56mL Perfusion (Tmax>6.0s) volume: 32mL Mismatch Volume: 64mL Infarction Location: Right occipital lobe. IMPRESSION: Occlusion of the right PCA just beyond the proximal P1 segment. There is subsequent partial reconstitution at the level of the proximal right P2 segment. Perfusion imaging demonstrates 6 mL of penumbra within the right occipital lobe. No hemodynamically significant stenosis in the neck. These results were communicated to Dr. Selina Cooley at 1:24 pm on 01/05/2021 by text page via the [facility] messaging system. Electronically Signed   By: Guadlupe Spanish M.D.   On: 01/05/2021 13:32    PHYSICAL EXAM Pleasant middle-age Caucasian lady not in distress. . Afebrile. Head is nontraumatic. Neck is supple without bruit.    Cardiac exam no murmur or gallop. Lungs are clear to auscultation. Distal pulses are well felt.  Neurological Exam ;  Awake  Alert oriented x 3. Normal speech and language.eye movements full without nystagmus.fundi were not visualized. Vision acuity and fields appear normal. Hearing is normal. Palatal movements are normal. Face symmetric. Tongue midline. Normal strength, tone, reflexes and coordination. Normal sensation.  Gait deferred.  ASSESSMENT/PLAN [name] is a 70 y.o. female  with a history of atrial fibrillation on Eliquis who presented to [facility] with left-sided weakness earlier today.  She had gone to her bank and was leaving when this started.  On arrival to [facility], she was evaluated by teleneurologist Dr. Selina Cooley who examined the patient and found her to have an NIH of 15.  She had a CT angiogram demonstrating large vessel occlusion of the right P1.  After discussion, there was consideration that this could be a symptomatic lesion through thalamic involvement and therefore the decision was made to pursue thrombectomy given the severity of the symptoms.  Stroke:  right BG and thalamus acute infarcts embolic secondary to a. Fib, and missing eliquis dose.  Due to right PCA occlusion s/p mechanical thrombectomy Code Stroke CT head There is no acute intracranial hemorrhage or definite evidence of acute infarction. ASPECT score is 10. CTA head & neck Occlusion of the right PCA just beyond the proximal P1 segment.There is subsequent partial reconstitution at the level of the proximal right P2 segment. CT perfusion :penumbra 6 ml MRI  1. Multiple small foci of acute/early subacute ischemia within both hemispheres, with largest lesions in the right basal ganglia and thalamus. No hemorrhage or mass effect. 2. Old left cerebellar infarct and mild findings  of chronic small vessel disease. 2D Echo pending LDL 131 HgbA1c 5.5 VTE prophylaxis - Eliquis    Diet   Diet regular Room service appropriate? Yes; Fluid consistency: Thin   Eliquis (apixaban) daily prior to admission, now on Eliquis (apixaban) daily.  Therapy recommendations:  no needs Disposition:  Home  Hypertension Home meds:  lopressor Stable Permissive hypertension (OK if < 220/120) but gradually normalize in 5-7 days Long-term BP goal normotensive  Hyperlipidemia Home meds:  none,  LDL 131, goal < 70 Start  Atorvastatin 40 mg daily  Continue statin at discharge  Diabetes type II Controlled (no diagnosis) Home meds:  none HgbA1c 5.5, goal < 7.0 CBGs Recent Labs    01/06/21 0000 01/06/21 0340 01/06/21 0745  GLUCAP 122* 129* 87    Other Stroke Risk Factors Advanced Age >/= 65  Obesity, Body mass index is 33 kg/m., BMI >/= 30 associated with increased stroke risk, recommend weight loss, diet and exercise as appropriate    Hospital day # 1  Renee Lucks, MSN, NP-C [facility] [phone]  STROKE MD NOTE : I have personally obtained history,examined this patient, reviewed notes, independently viewed imaging studies, participated in medical decision making and plan of care.ROS completed by me personally and pertinent positives fully documented  I have made any additions or clarifications directly to the above note. Agree with note above.  She presented with left hemiplegia and sensory loss secondary to right PCA occlusion from atrial fibrillation missing 1 dose of Eliquis and Brilinta due to PCA stent for 1 week and then Eliquis and aspirin for 6 months followed by Eliquis alone.  Long discussion with patient and daughter and husband and answered questions.  Discussed with Dr. Ouida Sills. This patient is critically ill and at significant risk of neurological worsening, death and care requires constant monitoring of vital signs, hemodynamics,respiratory and cardiac monitoring, extensive review of multiple databases, frequent neurological assessment, discussion with family, other specialists and medical decision making of high complexity.I have made any additions or clarifications directly to the above note.This critical care time does not reflect procedure time, or teaching time or supervisory time of PA/NP/Med Resident etc but could involve care discussion time.  I spent 30 minutes of neurocritical care time  in the care of  this patient.      Delia Heady,  MD Medical Director [facility] Pager: [phone] 01/06/2021 1:32 PM   To contact Stroke Continuity provider, please refer to [email or web address]. After hours, contact General Neurology

## 2021-01-06 NOTE — TOC CAGE-AID Note (Signed)
Transition of Care [facility]) - CAGE-AID Screening   Patient Details  Name: Renee Benitez MRN: [id number] Date of Birth: [birth year]  Transition of Care [facility]) CM/SW Contact:    Cici Rodriges C Tarpley-Carter, LCSWA Phone Number: 01/06/2021, 12:02 PM   Clinical Narrative: Pt is unable to participate in Cage Aid.   Primus Gritton Tarpley-Carter, MSW, LCSW-A Pronouns:  She/Her/Hers [facility] HealthTransitions of Care Clinical Social Worker Direct Number:  [phone] [email or web address]  CAGE-AID Screening: Substance Abuse Screening unable to be completed due to: : Patient unable to participate             Substance Abuse Education Offered: No

## 2021-01-06 NOTE — Evaluation (Signed)
Clinical/Bedside Swallow Evaluation Patient Details  Name: Renee Benitez MRN: [id number] Date of Birth: 1950-12-08  Today's Date: 01/06/2021 Time: SLP Start Time (ACUTE ONLY): 0900 SLP Stop Time (ACUTE ONLY): 0936 SLP Time Calculation (min) (ACUTE ONLY): 36 min  Past Medical History:  Past Medical History:  Diagnosis Date   Atrial fibrillation (HCC)    Current use of estrogen therapy 01/31/2013   Dysrhythmia    AFib   Hypertension    Macular degeneration    Menopausal syndrome    Right ovarian cyst 01/25/2014   Past Surgical History:  Past Surgical History:  Procedure Laterality Date   ABDOMINAL HYSTERECTOMY     APPENDECTOMY     CARDIOVERSION N/A 09/14/2016   Procedure: CARDIOVERSION;  Surgeon: [name], MD;  Location: [facility]S;  Service: Cardiovascular;  Laterality: N/A;   CATARACT EXTRACTION W/ INTRAOCULAR LENS  IMPLANT, BILATERAL Bilateral    COLONOSCOPY  12/04/03   Friable anal canal otherwise normal rectum and colon   COLONOSCOPY N/A 03/19/2013   Dr. [name]: diverticulosis, hemorrhoids. next TCS 10 years   COLONOSCOPY N/A 05/02/2019   Procedure: COLONOSCOPY;  Surgeon: [name], MD;  Location: [facility] END[facility] SU[facility];  Service: Endoscopy;  Laterality: N/A;  7:30am   ESOPHAGOGASTRODUODENOSCOPY N/A 10/17/2018   Procedure: ESOPHAGOGASTRODUODENOSCOPY (EGD);  Surgeon: [name], MD;  Location: [facility];  Service: Endoscopy;  Laterality: N/A;  3:00pm   MALONEY DILATION N/A 10/17/2018   Procedure: Elease Hashimoto DILATION;  Surgeon: [name], MD;  Location: [facility];  Service: Endoscopy;  Laterality: N/A;   TEE WITHOUT CARDIOVERSION N/A 09/14/2016   Procedure: TRANSESOPHAGEAL ECHOCARDIOGRAM (TEE) WITH PROPOFOL;  Surgeon: [name], MD;  Location: [facility] ORS;  Service: Cardiovascular;  Laterality: N/A;   TONSILECTOMY, ADENOIDECTOMY, BILATERAL MYRINGOTOMY AND TUBES     HPI:  Renee Benitez is a 70 y.o. female with a history of atrial fibrillation on Eliquis who  presented to [facility] with left-sided weakness. She had a CT angiogram demonstrating large vessel occlusion of the right P1. Pt underwent thrombectomy given the severity of the symptoms. MRI shows Multiple small foci of acute/early subacute ischemia within both  hemispheres, with largest lesions in the right basal ganglia and  thalamus. No hemorrhage or mass effect.    Assessment / Plan / Recommendation  Clinical Impression  Pt demonstrates normal swallowing. Will initiate a regular diet and thin liquids. No SLP f/u needed will sign off. SLP Visit Diagnosis: Dysphagia, unspecified (R13.10)    Aspiration Risk  Mild aspiration risk    Diet Recommendation Regular;Thin liquid   Liquid Administration via: Cup;Straw Medication Administration: Whole meds with liquid Supervision: Patient able to self feed    Other  Recommendations      Recommendations for follow up therapy are one component of a multi-disciplinary discharge planning process, led by the attending physician.  Recommendations may be updated based on patient status, additional functional criteria and insurance authorization.  Follow up Recommendations None      Frequency and Duration            Prognosis        Swallow Study   General HPI: Renee Benitez is a 70 y.o. female with a history of atrial fibrillation on Eliquis who presented to [facility] with left-sided weakness. She had a CT angiogram demonstrating large vessel occlusion of the right P1. Pt underwent thrombectomy given the severity of the symptoms. MRI shows Multiple small foci of acute/early subacute  ischemia within both  hemispheres, with largest lesions in the right basal ganglia and  thalamus. No hemorrhage or mass effect. Type of Study: Bedside Swallow Evaluation Previous Swallow Assessment: none Diet Prior to this Study: NPO Temperature Spikes Noted: No Respiratory Status: Room air History of Recent Intubation:  No Behavior/Cognition: Alert;Cooperative;Pleasant mood Oral Cavity Assessment: Within Functional Limits Oral Care Completed by SLP: No Oral Cavity - Dentition: Adequate natural dentition Vision: Functional for self-feeding Self-Feeding Abilities: Able to feed self Patient Positioning: Upright in bed Baseline Vocal Quality: Normal Volitional Cough: Strong Volitional Swallow: Able to elicit    Oral/Motor/Sensory Function Overall Oral Motor/Sensory Function: Within functional limits   Ice Chips     Thin Liquid Thin Liquid: Within functional limits    Nectar Thick Nectar Thick Liquid: Not tested   Honey Thick Honey Thick Liquid: Not tested   Puree Puree: Within functional limits   Solid     Solid: Within functional limits      Deveion Denz, Riley Nearing 01/06/2021,9:39 AM

## 2021-01-06 NOTE — Progress Notes (Signed)
  Echocardiogram 2D Echocardiogram has been performed.  Renee Benitez 01/06/2021, 11:18 AM

## 2021-01-06 NOTE — Anesthesia Postprocedure Evaluation (Signed)
Anesthesia Post Note  Patient: Renee Benitez  Procedure(s) Performed: IR WITH ANESTHESIA     Patient location during evaluation: ICU Anesthesia Type: General Level of consciousness: sedated and patient cooperative Pain management: pain level controlled Vital Signs Assessment: post-procedure vital signs reviewed and stable Respiratory status: spontaneous breathing Cardiovascular status: stable Anesthetic complications: no   No notable events documented.  Last Vitals:  Vitals:   01/06/21 0747 01/06/21 0800  BP:  (!) 142/70  Pulse:  66  Resp:  16  Temp: 36.7 C   SpO2:  98%    Last Pain:  Vitals:   01/06/21 0800  TempSrc:   PainSc: 0-No pain                 Lewie Loron

## 2021-01-06 NOTE — Progress Notes (Signed)
PT Cancellation Note  Patient Details Name: Renee Benitez MRN: [id number] DOB: 1950-11-28   Cancelled Treatment:    Reason Eval/Treat Not Completed: PT screened, no needs identified, will sign off (pt up with OT without residual deficit at this time per OT and pt. pt declined need for P.T. evaluation. Will screen)   Sarah Zerby B Jorgen Wolfinger 01/06/2021, 8:38 AM Merryl Hacker, PT Acute Rehabilitation Services Pager: [phone] Office: [phone]

## 2021-01-07 ENCOUNTER — Other Ambulatory Visit (HOSPITAL_COMMUNITY): Payer: Self-pay

## 2021-01-07 DIAGNOSIS — I63431 Cerebral infarction due to embolism of right posterior cerebral artery: Secondary | ICD-10-CM | POA: Diagnosis not present

## 2021-01-07 MED ORDER — ATORVASTATIN CALCIUM 40 MG PO TABS
40.0000 mg | ORAL_TABLET | Freq: Every day | ORAL | 1 refills | Status: DC
  Filled 2021-01-07: qty 30, 30d supply, fill #0

## 2021-01-07 MED ORDER — TICAGRELOR 90 MG PO TABS
90.0000 mg | ORAL_TABLET | Freq: Two times a day (BID) | ORAL | 0 refills | Status: AC
  Filled 2021-01-07: qty 14, 7d supply, fill #0

## 2021-01-07 MED ORDER — ASPIRIN 81 MG PO TBEC
81.0000 mg | DELAYED_RELEASE_TABLET | Freq: Every day | ORAL | 2 refills | Status: AC
  Filled 2021-01-07: qty 30, 30d supply, fill #0

## 2021-01-07 NOTE — Discharge Summary (Addendum)
Stroke Discharge Summary  Patient ID: Renee Benitez   MRN: [id number]      DOB: [birth year]  Date of Admission: 01/05/2021 Date of Discharge: 01/07/2021  Attending Physician:  Dr. Antony Contras Consultant(s):   Treatment Team:  [facility], MD  Patient's PCP:  Caren Macadam, MD  DISCHARGE DIAGNOSIS:  1. Right BG and thalamus acute infarcts embolic secondary to atrial fibrillation and missing eliquis dose.   2. Right PCA occlusion s/p Successful mechanical thrombectomy followed by stenting and angioplasty for treatment of a right P1/PCA occlusion with underlying proximal P2 segment stenosis. Active Problems:   Stroke (cerebrum) (HCC)   Allergies as of 01/07/2021   No Known Allergies      Medication List     TAKE these medications    acetaminophen 500 MG tablet Commonly known as: TYLENOL Take 1,000 mg by mouth every 6 (six) hours as needed for moderate pain or headache.   Aspirin Low Dose 81 MG EC tablet Generic drug: aspirin Take 1 tablet (81 mg total) by mouth daily. Start taking on: January 15, 2021   atorvastatin 40 MG tablet Commonly known as: LIPITOR Take 1 tablet (40 mg total) by mouth daily.   Brilinta 90 MG Tabs tablet Generic drug: ticagrelor Take 1 tablet (90 mg total) by mouth 2 (two) times daily for 7 days. Then STOP TAKING   CALCIUM 600 + D PO Take 1 tablet by mouth daily.   Dialyvite Vitamin D 5000 125 MCG (5000 UT) capsule Generic drug: Cholecalciferol Take 5,000 Units by mouth daily.   diltiazem 180 MG 24 hr capsule Commonly known as: Cartia XT Take 1 capsule (180 mg total) by mouth daily.   diltiazem 60 MG tablet Commonly known as: CARDIZEM Take 60 mg by mouth daily as needed (afib).   Eliquis 5 MG Tabs tablet Generic drug: apixaban TAKE ONE TABLET (5MG TOTAL) BY MOUTH TWOTIMES DAILY What changed: See the new instructions.   Fish Oil Ultra 1400 MG Caps Take 1,400 mg by mouth daily.   flecainide 100 MG  tablet Commonly known as: TAMBOCOR Take 1 tablet (100 mg total) by mouth 2 (two) times daily.   levothyroxine 50 MCG tablet Commonly known as: SYNTHROID Take 50 mcg by mouth daily.   Magnesium 250 MG Tabs Take 250 mg by mouth at bedtime.   metoprolol tartrate 25 MG tablet Commonly known as: LOPRESSOR Take 1 tablet by mouth every 8 hours as needed for breakthrough afib What changed:  how much to take how to take this when to take this reasons to take this additional instructions   multivitamin with minerals tablet Take 1 tablet by mouth daily.   PreserVision AREDS 2 Caps Take 1 capsule by mouth 2 (two) times daily.   pantoprazole 40 MG tablet Commonly known as: PROTONIX TAKE ONE TABLET (40MG TOTAL) BY MOUTH DAILY What changed: See the new instructions.        LABORATORY STUDIES CBC    Component Value Date/Time   WBC 6.1 01/05/2021 1206   RBC 4.53 01/05/2021 1206   HGB 13.9 01/05/2021 1222   HCT 41.0 01/05/2021 1222   PLT 226 01/05/2021 1206   MCV 92.7 01/05/2021 1206   MCH 31.1 01/05/2021 1206   MCHC 33.6 01/05/2021 1206   RDW 12.8 01/05/2021 1206   LYMPHSABS 2.7 01/05/2021 1206   MONOABS 0.5 01/05/2021 1206   EOSABS 0.1 01/05/2021 1206   BASOSABS 0.1 01/05/2021 1206   CMP    Component  Value Date/Time   NA 142 01/05/2021 1222   K 4.4 01/05/2021 1222   CL 106 01/05/2021 1222   CO2 25 01/05/2021 1206   GLUCOSE 92 01/05/2021 1222   BUN 16 01/05/2021 1222   CREATININE 0.90 01/05/2021 1222   CALCIUM 9.2 01/05/2021 1206   PROT 6.9 01/05/2021 1206   ALBUMIN 3.9 01/05/2021 1206   AST 29 01/05/2021 1206   ALT 34 01/05/2021 1206   ALKPHOS 73 01/05/2021 1206   BILITOT 0.6 01/05/2021 1206   GFRNONAA >60 01/05/2021 1206   GFRAA >60 11/07/2018 1059   COAGS Lab Results  Component Value Date   INR 1.2 01/05/2021   Lipid Panel    Component Value Date/Time   CHOL 196 01/06/2021 0433   TRIG 60 01/06/2021 0433   HDL 53 01/06/2021 0433   CHOLHDL 3.7  01/06/2021 0433   VLDL 12 01/06/2021 0433   LDLCALC 131 (H) 01/06/2021 0433   HgbA1C  Lab Results  Component Value Date   HGBA1C 5.5 01/06/2021   Urinalysis    Component Value Date/Time   GLUCOSEU Negative 07/16/2020 1037   PROTEINUR neg 04/25/2017 1351   NITRITE neg 07/16/2020 1037   LEUKOCYTESUR Negative 07/16/2020 1037   Urine Drug Screen No results found for: LABOPIA, COCAINSCRNUR, LABBENZ, AMPHETMU, THCU, LABBARB  Alcohol Level No results found for: [facility]   SIGNIFICANT DIAGNOSTIC STUDIES MR BRAIN WO CONTRAST  Result Date: 01/05/2021 CLINICAL DATA:  Left-sided weakness, status post endovascular intervention. EXAM: MRI HEAD WITHOUT CONTRAST TECHNIQUE: Multiplanar, multiecho pulse sequences of the brain and surrounding structures were obtained without intravenous contrast. COMPARISON:  None. FINDINGS: Brain: There are multiple small foci of acute/early subacute ischemia within both hemispheres. The most notable lesions are in the right basal ganglia and thalamus. There also punctate lesions in the left parietal lobe and right cerebellum. No acute or chronic hemorrhage. Old left cerebellar infarct. Mild findings of chronic small vessel disease. The midline structures are normal. Vascular: Focus of magnetic susceptibility effects at the right PCA P1 segment may a stent. Skull and upper cervical spine: Normal calvarium and skull base. Visualized upper cervical spine and soft tissues are normal. Sinuses/Orbits:No paranasal sinus fluid levels or advanced mucosal thickening. No mastoid or middle ear effusion. Normal orbits. IMPRESSION: 1. Multiple small foci of acute/early subacute ischemia within both hemispheres, with largest lesions in the right basal ganglia and thalamus. No hemorrhage or mass effect. 2. Old left cerebellar infarct and mild findings of chronic small vessel disease. Electronically Signed   By: Ulyses Jarred M.D.   On: 01/05/2021 23:33   IR Intra Cran Stent  Result Date:  01/06/2021 INDICATION: 70 year old female with past medical history of hypertension and atrial fibrillation on Eliquis presenting to [facility] with acute onset dizziness, slurred speech, left hemianopia and left-sided weakness. Her last known well was 11:25 a.m. on 01/05/2021. NIHSS at presentation was 15 with a baseline modified Rankin scale of 0. Head CT showed no evidence of large acute infarct or hemorrhage (ASPECTS 10). No IV thrombolytic administered due to Eliquis administration the night before. CT angiogram of the head and neck showed a right PCA/P1 segment occlusion. She was transferred to our service for a diagnostic cerebral angiogram and mechanical thrombectomy. EXAM: ULTRASOUND-GUIDED VASCULAR ACCESS DIAGNOSTIC CEREBRAL ANGIOGRAM MECHANICAL THROMBECTOMY FLAT PANEL HEAD CT INTRACRANIAL STENTING AND ANGIOPLASTY FLAT PANEL HEAD CT COMPARISON:  CT/CT angiogram of the head and neck January 05, 2021. MEDICATIONS: Cangrelor IV bolus and drip. 180 mg of ticagrelor and 325  mg of aspirin via OG tube. ANESTHESIA/SEDATION: The procedure was performed under general anesthesia. CONTRAST:  140 mL of Omnipaque 240 milligram/mL FLUOROSCOPY TIME:  Fluoroscopy Time: 63 minutes 6 seconds (2910 mGy). COMPLICATIONS: None immediate. TECHNIQUE: Informed written consent was obtained from the patient's husband after a thorough discussion of the procedural risks, benefits and alternatives. All questions were addressed. Maximal Sterile Barrier Technique was utilized including caps, mask, sterile gowns, sterile gloves, sterile drape, hand hygiene and skin antiseptic. A timeout was performed prior to the initiation of the procedure. Using the modified Seldinger technique and a micropuncture kit, access was gained to the distal left radial artery at the anatomical snuffbox and a 7 French sheath was placed. Real-time ultrasound guidance was utilized for vascular access including the acquisition of a permanent ultrasound  image documenting patency of the accessed vessel. Slow intra arterial infusion of 300 mcg nitroglycerin diluted in patient's own blood was performed. No significant fluctuation in patient's blood pressure seen. Then, a left radial artery angiogram was obtained via sheath side port. Next, a 5 [place] JB 2 glide catheter was navigated over a 0.035" Terumo Glidewire into the proximal left subclavian artery under fluoroscopic guidance. Frontal and lateral angiograms of the neck were obtained. Under biplane roadmap, the catheter was then advanced into the V2 segment of the left vertebral artery. The catheter was exchanged over the wire and under biplane roadmap for a 7 [place] Rist catheter which was placed in the left V2-V3 junction. Frontal and lateral angiograms of the head were obtained. FINDINGS: 1. Normal brachial artery branching pattern seen. No significant anatomical variation. The left radial artery caliber is adequate for vascular access. 2. Occlusion of the right P1/PCA segment. PROCEDURE: Under biplane roadmap, a 5 [place] Sof[place] aspiration catheter was navigated over an Aristotle 14 microguidewire into the basilar artery. The aspiration catheter was then advanced to the level of occlusion in the right PCA and connected to an aspiration pump. Continuous aspiration was performed for 2 minutes. The guide catheter was connected to a VacLok syringe. The aspiration catheter was subsequently removed under constant aspiration. The guide catheter was aspirated for debris. Left vertebral artery angiograms with magnified frontal and lateral views of the head showed recanalization of the right PCA (TICI 3) with brisk distal flow. Underlying severe stenosis at the proximal P2 segment was noted. Intra arterial infusion 5 mg of verapamil was performed. Delayed angiograms showed slow flow in the right PCA distal to the stenosis without improvement of the stenosis. Under biplane roadmap, a [place] aspiration catheter was  navigated over an Aristotle 14 microguidewire into the basilar artery. The aspiration catheter was then advanced to the level of occlusion in the right PCA and connected to an aspiration pump. Continuous aspiration was performed for 2 minutes. The guide catheter was connected to a VacLok syringe. The aspiration catheter was subsequently removed under constant aspiration. The guide catheter was aspirated for debris. Left vertebral artery angiograms with magnified frontal and lateral views of the head showed improved anterograde flow with persistent stenosis. The [place] catheter was navigated over the wire into the right P1/PCA and intra arterial infusion 5 mg of verapamil was performed directly into the right ICA. Delayed angiograms showed slow flow in the right PCA distal to the stenosis without improvement of the stenosis. Under biplane roadmap, a [place] aspiration catheter was navigated over a phenom 21 microcatheter and a Aristotle 14 microguidewire into the basilar artery. The microcatheter was then navigated over the wire  into the distal right P2/PCA. Then, a 3 mm solitaire stent retriever was deployed spanning the right P1 and P2 segment. The device was allowed to intercalated with the clot for 4 minutes. The microcatheter was removed. The aspiration catheter was advanced to the level of occlusion and connected to a penumbra aspiration pump. The thrombectomy device and aspiration catheter were removed under constant aspiration. Left vertebral artery angiograms with magnified frontal and lateral views of the head again showed improved anterograde flow with persistent stenosis. Flat panel CT of the head was obtained and post processed in a separate workstation with concurrent attending physician supervision. Selected images were sent to PACS. No evidence of hemorrhagic transformation. Delayed left vertebral artery angiograms with multiple magnified oblique views of the head showed persistent stenosis of  the proximal right P2 segment with progressive slowed flow. Patient was loaded on Cangrelor followed by continuous infusion. Under biplane roadmap an SL 10 microcatheter was navigated over a synchro 2 micro guidewire into the distal right P2/PCA. Then, a 3 x 15 mm neural form atlas intracranial stent was deployed spanning the P1 and proximal P2 segments, across the stenosis. Left vertebral artery angiograms with frontal and lateral views of the head showed adequate stent positioning across the stenosis, however, there was significant residual stenosis. Under biplane roadmap an SL 10 microcatheter was navigated over a synchro 2 micro guidewire into the recently deployed stent. The microcatheter was then exchanged over the wire for a 1.5 x 15 mm Gateway balloon. In stent angioplasty was performed under fluoroscopy. Left vertebral artery angiograms with frontal and lateral views of the head showed improved anterograde flow and improved degree of stenosis with mild residual stenosis. Flat panel CT of the head was obtained and post processed in a separate workstation with concurrent attending physician supervision. Selected images were sent to PACS. No evidence of hemorrhagic complication. The catheter was subsequently withdrawn. An inflatable band was placed and inflated over the left hand access site. The vascular sheath was withdrawn and the band was slowly deflated until brisk flow was noted through the arteriotomy site. At this point, the band was reinflated with additional 2 cc of air to obtain patent hemostasis. IMPRESSION: Successful mechanical thrombectomy followed by stenting and angioplasty for treatment of a right P1/PCA occlusion with underlying proximal P2 segment stenosis. PLAN: Follow-up cerebral angiogram in 6 months to evaluate stent patency. Electronically Signed   By: Pedro Earls M.D.   On: 01/06/2021 15:15   IR CT Head Ltd  Result Date: 01/06/2021 INDICATION: 70 year old female  with past medical history of hypertension and atrial fibrillation on Eliquis presenting to [facility] with acute onset dizziness, slurred speech, left hemianopia and left-sided weakness. Her last known well was 11:25 a.m. on 01/05/2021. NIHSS at presentation was 15 with a baseline modified Rankin scale of 0. Head CT showed no evidence of large acute infarct or hemorrhage (ASPECTS 10). No IV thrombolytic administered due to Eliquis administration the night before. CT angiogram of the head and neck showed a right PCA/P1 segment occlusion. She was transferred to our service for a diagnostic cerebral angiogram and mechanical thrombectomy. EXAM: ULTRASOUND-GUIDED VASCULAR ACCESS DIAGNOSTIC CEREBRAL ANGIOGRAM MECHANICAL THROMBECTOMY FLAT PANEL HEAD CT INTRACRANIAL STENTING AND ANGIOPLASTY FLAT PANEL HEAD CT COMPARISON:  CT/CT angiogram of the head and neck January 05, 2021. MEDICATIONS: Cangrelor IV bolus and drip. 180 mg of ticagrelor and 325 mg of aspirin via OG tube. ANESTHESIA/SEDATION: The procedure was performed under general anesthesia. CONTRAST:  140  mL of Omnipaque 240 milligram/mL FLUOROSCOPY TIME:  Fluoroscopy Time: 63 minutes 6 seconds (2910 mGy). COMPLICATIONS: None immediate. TECHNIQUE: Informed written consent was obtained from the patient's husband after a thorough discussion of the procedural risks, benefits and alternatives. All questions were addressed. Maximal Sterile Barrier Technique was utilized including caps, mask, sterile gowns, sterile gloves, sterile drape, hand hygiene and skin antiseptic. A timeout was performed prior to the initiation of the procedure. Using the modified Seldinger technique and a micropuncture kit, access was gained to the distal left radial artery at the anatomical snuffbox and a 7 French sheath was placed. Real-time ultrasound guidance was utilized for vascular access including the acquisition of a permanent ultrasound image documenting patency of the accessed  vessel. Slow intra arterial infusion of 300 mcg nitroglycerin diluted in patient's own blood was performed. No significant fluctuation in patient's blood pressure seen. Then, a left radial artery angiogram was obtained via sheath side port. Next, a 5 [place] JB 2 glide catheter was navigated over a 0.035" Terumo Glidewire into the proximal left subclavian artery under fluoroscopic guidance. Frontal and lateral angiograms of the neck were obtained. Under biplane roadmap, the catheter was then advanced into the V2 segment of the left vertebral artery. The catheter was exchanged over the wire and under biplane roadmap for a 7 [place] Rist catheter which was placed in the left V2-V3 junction. Frontal and lateral angiograms of the head were obtained. FINDINGS: 1. Normal brachial artery branching pattern seen. No significant anatomical variation. The left radial artery caliber is adequate for vascular access. 2. Occlusion of the right P1/PCA segment. PROCEDURE: Under biplane roadmap, a 5 [place] Sofia aspiration catheter was navigated over an Aristotle 14 microguidewire into the basilar artery. The aspiration catheter was then advanced to the level of occlusion in the right PCA and connected to an aspiration pump. Continuous aspiration was performed for 2 minutes. The guide catheter was connected to a VacLok syringe. The aspiration catheter was subsequently removed under constant aspiration. The guide catheter was aspirated for debris. Left vertebral artery angiograms with magnified frontal and lateral views of the head showed recanalization of the right PCA (TICI 3) with brisk distal flow. Underlying severe stenosis at the proximal P2 segment was noted. Intra arterial infusion 5 mg of verapamil was performed. Delayed angiograms showed slow flow in the right PCA distal to the stenosis without improvement of the stenosis. Under biplane roadmap, a [place] aspiration catheter was navigated over an Aristotle 14  microguidewire into the basilar artery. The aspiration catheter was then advanced to the level of occlusion in the right PCA and connected to an aspiration pump. Continuous aspiration was performed for 2 minutes. The guide catheter was connected to a VacLok syringe. The aspiration catheter was subsequently removed under constant aspiration. The guide catheter was aspirated for debris. Left vertebral artery angiograms with magnified frontal and lateral views of the head showed improved anterograde flow with persistent stenosis. The [place] catheter was navigated over the wire into the right P1/PCA and intra arterial infusion 5 mg of verapamil was performed directly into the right ICA. Delayed angiograms showed slow flow in the right PCA distal to the stenosis without improvement of the stenosis. Under biplane roadmap, a [place] aspiration catheter was navigated over a phenom 21 microcatheter and a Aristotle 14 microguidewire into the basilar artery. The microcatheter was then navigated over the wire into the distal right P2/PCA. Then, a 3 mm solitaire stent retriever was deployed spanning the right  P1 and P2 segment. The device was allowed to intercalated with the clot for 4 minutes. The microcatheter was removed. The aspiration catheter was advanced to the level of occlusion and connected to a penumbra aspiration pump. The thrombectomy device and aspiration catheter were removed under constant aspiration. Left vertebral artery angiograms with magnified frontal and lateral views of the head again showed improved anterograde flow with persistent stenosis. Flat panel CT of the head was obtained and post processed in a separate workstation with concurrent attending physician supervision. Selected images were sent to PACS. No evidence of hemorrhagic transformation. Delayed left vertebral artery angiograms with multiple magnified oblique views of the head showed persistent stenosis of the proximal right P2 segment  with progressive slowed flow. Patient was loaded on Cangrelor followed by continuous infusion. Under biplane roadmap an SL 10 microcatheter was navigated over a synchro 2 micro guidewire into the distal right P2/PCA. Then, a 3 x 15 mm neural form atlas intracranial stent was deployed spanning the P1 and proximal P2 segments, across the stenosis. Left vertebral artery angiograms with frontal and lateral views of the head showed adequate stent positioning across the stenosis, however, there was significant residual stenosis. Under biplane roadmap an SL 10 microcatheter was navigated over a synchro 2 micro guidewire into the recently deployed stent. The microcatheter was then exchanged over the wire for a 1.5 x 15 mm Gateway balloon. In stent angioplasty was performed under fluoroscopy. Left vertebral artery angiograms with frontal and lateral views of the head showed improved anterograde flow and improved degree of stenosis with mild residual stenosis. Flat panel CT of the head was obtained and post processed in a separate workstation with concurrent attending physician supervision. Selected images were sent to PACS. No evidence of hemorrhagic complication. The catheter was subsequently withdrawn. An inflatable band was placed and inflated over the left hand access site. The vascular sheath was withdrawn and the band was slowly deflated until brisk flow was noted through the arteriotomy site. At this point, the band was reinflated with additional 2 cc of air to obtain patent hemostasis. IMPRESSION: Successful mechanical thrombectomy followed by stenting and angioplasty for treatment of a right P1/PCA occlusion with underlying proximal P2 segment stenosis. PLAN: Follow-up cerebral angiogram in 6 months to evaluate stent patency. Electronically Signed   By: Pedro Earls M.D.   On: 01/06/2021 15:15   IR CT Head Ltd  Result Date: 01/06/2021 INDICATION: 71 year old female with past medical history of  hypertension and atrial fibrillation on Eliquis presenting to [facility] with acute onset dizziness, slurred speech, left hemianopia and left-sided weakness. Her last known well was 11:25 a.m. on 01/05/2021. NIHSS at presentation was 15 with a baseline modified Rankin scale of 0. Head CT showed no evidence of large acute infarct or hemorrhage (ASPECTS 10). No IV thrombolytic administered due to Eliquis administration the night before. CT angiogram of the head and neck showed a right PCA/P1 segment occlusion. She was transferred to our service for a diagnostic cerebral angiogram and mechanical thrombectomy. EXAM: ULTRASOUND-GUIDED VASCULAR ACCESS DIAGNOSTIC CEREBRAL ANGIOGRAM MECHANICAL THROMBECTOMY FLAT PANEL HEAD CT INTRACRANIAL STENTING AND ANGIOPLASTY FLAT PANEL HEAD CT COMPARISON:  CT/CT angiogram of the head and neck January 05, 2021. MEDICATIONS: Cangrelor IV bolus and drip. 180 mg of ticagrelor and 325 mg of aspirin via OG tube. ANESTHESIA/SEDATION: The procedure was performed under general anesthesia. CONTRAST:  140 mL of Omnipaque 240 milligram/mL FLUOROSCOPY TIME:  Fluoroscopy Time: 63 minutes 6 seconds (2910 mGy). COMPLICATIONS:  None immediate. TECHNIQUE: Informed written consent was obtained from the patient's husband after a thorough discussion of the procedural risks, benefits and alternatives. All questions were addressed. Maximal Sterile Barrier Technique was utilized including caps, mask, sterile gowns, sterile gloves, sterile drape, hand hygiene and skin antiseptic. A timeout was performed prior to the initiation of the procedure. Using the modified Seldinger technique and a micropuncture kit, access was gained to the distal left radial artery at the anatomical snuffbox and a 7 French sheath was placed. Real-time ultrasound guidance was utilized for vascular access including the acquisition of a permanent ultrasound image documenting patency of the accessed vessel. Slow intra arterial  infusion of 300 mcg nitroglycerin diluted in patient's own blood was performed. No significant fluctuation in patient's blood pressure seen. Then, a left radial artery angiogram was obtained via sheath side port. Next, a 5 [place] JB 2 glide catheter was navigated over a 0.035" Terumo Glidewire into the proximal left subclavian artery under fluoroscopic guidance. Frontal and lateral angiograms of the neck were obtained. Under biplane roadmap, the catheter was then advanced into the V2 segment of the left vertebral artery. The catheter was exchanged over the wire and under biplane roadmap for a 7 [place] Rist catheter which was placed in the left V2-V3 junction. Frontal and lateral angiograms of the head were obtained. FINDINGS: 1. Normal brachial artery branching pattern seen. No significant anatomical variation. The left radial artery caliber is adequate for vascular access. 2. Occlusion of the right P1/PCA segment. PROCEDURE: Under biplane roadmap, a 5 [place] aspiration catheter was navigated over an Aristotle 14 microguidewire into the basilar artery. The aspiration catheter was then advanced to the level of occlusion in the right PCA and connected to an aspiration pump. Continuous aspiration was performed for 2 minutes. The guide catheter was connected to a VacLok syringe. The aspiration catheter was subsequently removed under constant aspiration. The guide catheter was aspirated for debris. Left vertebral artery angiograms with magnified frontal and lateral views of the head showed recanalization of the right PCA (TICI 3) with brisk distal flow. Underlying severe stenosis at the proximal P2 segment was noted. Intra arterial infusion 5 mg of verapamil was performed. Delayed angiograms showed slow flow in the right PCA distal to the stenosis without improvement of the stenosis. Under biplane roadmap, a [place] aspiration catheter was navigated over an Aristotle 14 microguidewire into the basilar  artery. The aspiration catheter was then advanced to the level of occlusion in the right PCA and connected to an aspiration pump. Continuous aspiration was performed for 2 minutes. The guide catheter was connected to a VacLok syringe. The aspiration catheter was subsequently removed under constant aspiration. The guide catheter was aspirated for debris. Left vertebral artery angiograms with magnified frontal and lateral views of the head showed improved anterograde flow with persistent stenosis. The [place] catheter was navigated over the wire into the right P1/PCA and intra arterial infusion 5 mg of verapamil was performed directly into the right ICA. Delayed angiograms showed slow flow in the right PCA distal to the stenosis without improvement of the stenosis. Under biplane roadmap, a [place] aspiration catheter was navigated over a phenom 21 microcatheter and a Aristotle 14 microguidewire into the basilar artery. The microcatheter was then navigated over the wire into the distal right P2/PCA. Then, a 3 mm solitaire stent retriever was deployed spanning the right P1 and P2 segment. The device was allowed to intercalated with the clot for 4 minutes. The  microcatheter was removed. The aspiration catheter was advanced to the level of occlusion and connected to a penumbra aspiration pump. The thrombectomy device and aspiration catheter were removed under constant aspiration. Left vertebral artery angiograms with magnified frontal and lateral views of the head again showed improved anterograde flow with persistent stenosis. Flat panel CT of the head was obtained and post processed in a separate workstation with concurrent attending physician supervision. Selected images were sent to PACS. No evidence of hemorrhagic transformation. Delayed left vertebral artery angiograms with multiple magnified oblique views of the head showed persistent stenosis of the proximal right P2 segment with progressive slowed flow.  Patient was loaded on Cangrelor followed by continuous infusion. Under biplane roadmap an SL 10 microcatheter was navigated over a synchro 2 micro guidewire into the distal right P2/PCA. Then, a 3 x 15 mm neural form atlas intracranial stent was deployed spanning the P1 and proximal P2 segments, across the stenosis. Left vertebral artery angiograms with frontal and lateral views of the head showed adequate stent positioning across the stenosis, however, there was significant residual stenosis. Under biplane roadmap an SL 10 microcatheter was navigated over a synchro 2 micro guidewire into the recently deployed stent. The microcatheter was then exchanged over the wire for a 1.5 x 15 mm Gateway balloon. In stent angioplasty was performed under fluoroscopy. Left vertebral artery angiograms with frontal and lateral views of the head showed improved anterograde flow and improved degree of stenosis with mild residual stenosis. Flat panel CT of the head was obtained and post processed in a separate workstation with concurrent attending physician supervision. Selected images were sent to PACS. No evidence of hemorrhagic complication. The catheter was subsequently withdrawn. An inflatable band was placed and inflated over the left hand access site. The vascular sheath was withdrawn and the band was slowly deflated until brisk flow was noted through the arteriotomy site. At this point, the band was reinflated with additional 2 cc of air to obtain patent hemostasis. IMPRESSION: Successful mechanical thrombectomy followed by stenting and angioplasty for treatment of a right P1/PCA occlusion with underlying proximal P2 segment stenosis. PLAN: Follow-up cerebral angiogram in 6 months to evaluate stent patency. Electronically Signed   By: Pedro Earls M.D.   On: 01/06/2021 15:15   IR US Guide Vasc Access Left  Result Date: 01/06/2021 INDICATION: 70 year old female with past medical history of hypertension and  atrial fibrillation on Eliquis presenting to [facility] with acute onset dizziness, slurred speech, left hemianopia and left-sided weakness. Her last known well was 11:25 a.m. on 01/05/2021. NIHSS at presentation was 15 with a baseline modified Rankin scale of 0. Head CT showed no evidence of large acute infarct or hemorrhage (ASPECTS 10). No IV thrombolytic administered due to Eliquis administration the night before. CT angiogram of the head and neck showed a right PCA/P1 segment occlusion. She was transferred to our service for a diagnostic cerebral angiogram and mechanical thrombectomy. EXAM: ULTRASOUND-GUIDED VASCULAR ACCESS DIAGNOSTIC CEREBRAL ANGIOGRAM MECHANICAL THROMBECTOMY FLAT PANEL HEAD CT INTRACRANIAL STENTING AND ANGIOPLASTY FLAT PANEL HEAD CT COMPARISON:  CT/CT angiogram of the head and neck January 05, 2021. MEDICATIONS: Cangrelor IV bolus and drip. 180 mg of ticagrelor and 325 mg of aspirin via OG tube. ANESTHESIA/SEDATION: The procedure was performed under general anesthesia. CONTRAST:  140 mL of Omnipaque 240 milligram/mL FLUOROSCOPY TIME:  Fluoroscopy Time: 63 minutes 6 seconds (2910 mGy). COMPLICATIONS: None immediate. TECHNIQUE: Informed written consent was obtained from the patient's husband after a thorough  discussion of the procedural risks, benefits and alternatives. All questions were addressed. Maximal Sterile Barrier Technique was utilized including caps, mask, sterile gowns, sterile gloves, sterile drape, hand hygiene and skin antiseptic. A timeout was performed prior to the initiation of the procedure. Using the modified Seldinger technique and a micropuncture kit, access was gained to the distal left radial artery at the anatomical snuffbox and a 7 French sheath was placed. Real-time ultrasound guidance was utilized for vascular access including the acquisition of a permanent ultrasound image documenting patency of the accessed vessel. Slow intra arterial infusion of 300 mcg  nitroglycerin diluted in patient's own blood was performed. No significant fluctuation in patient's blood pressure seen. Then, a left radial artery angiogram was obtained via sheath side port. Next, a 5 [place] JB 2 glide catheter was navigated over a 0.035" Terumo Glidewire into the proximal left subclavian artery under fluoroscopic guidance. Frontal and lateral angiograms of the neck were obtained. Under biplane roadmap, the catheter was then advanced into the V2 segment of the left vertebral artery. The catheter was exchanged over the wire and under biplane roadmap for a 7 [place] Rist catheter which was placed in the left V2-V3 junction. Frontal and lateral angiograms of the head were obtained. FINDINGS: 1. Normal brachial artery branching pattern seen. No significant anatomical variation. The left radial artery caliber is adequate for vascular access. 2. Occlusion of the right P1/PCA segment. PROCEDURE: Under biplane roadmap, a 5 [place] Sof[place] aspiration catheter was navigated over an Aristotle 14 microguidewire into the basilar artery. The aspiration catheter was then advanced to the level of occlusion in the right PCA and connected to an aspiration pump. Continuous aspiration was performed for 2 minutes. The guide catheter was connected to a VacLok syringe. The aspiration catheter was subsequently removed under constant aspiration. The guide catheter was aspirated for debris. Left vertebral artery angiograms with magnified frontal and lateral views of the head showed recanalization of the right PCA (TICI 3) with brisk distal flow. Underlying severe stenosis at the proximal P2 segment was noted. Intra arterial infusion 5 mg of verapamil was performed. Delayed angiograms showed slow flow in the right PCA distal to the stenosis without improvement of the stenosis. Under biplane roadmap, a [place] aspiration catheter was navigated over an Aristotle 14 microguidewire into the basilar artery. The aspiration  catheter was then advanced to the level of occlusion in the right PCA and connected to an aspiration pump. Continuous aspiration was performed for 2 minutes. The guide catheter was connected to a VacLok syringe. The aspiration catheter was subsequently removed under constant aspiration. The guide catheter was aspirated for debris. Left vertebral artery angiograms with magnified frontal and lateral views of the head showed improved anterograde flow with persistent stenosis. The [place] catheter was navigated over the wire into the right P1/PCA and intra arterial infusion 5 mg of verapamil was performed directly into the right ICA. Delayed angiograms showed slow flow in the right PCA distal to the stenosis without improvement of the stenosis. Under biplane roadmap, a [place] aspiration catheter was navigated over a phenom 21 microcatheter and a Aristotle 14 microguidewire into the basilar artery. The microcatheter was then navigated over the wire into the distal right P2/PCA. Then, a 3 mm solitaire stent retriever was deployed spanning the right P1 and P2 segment. The device was allowed to intercalated with the clot for 4 minutes. The microcatheter was removed. The aspiration catheter was advanced to the level of occlusion and connected  to a penumbra aspiration pump. The thrombectomy device and aspiration catheter were removed under constant aspiration. Left vertebral artery angiograms with magnified frontal and lateral views of the head again showed improved anterograde flow with persistent stenosis. Flat panel CT of the head was obtained and post processed in a separate workstation with concurrent attending physician supervision. Selected images were sent to PACS. No evidence of hemorrhagic transformation. Delayed left vertebral artery angiograms with multiple magnified oblique views of the head showed persistent stenosis of the proximal right P2 segment with progressive slowed flow. Patient was loaded on  Cangrelor followed by continuous infusion. Under biplane roadmap an SL 10 microcatheter was navigated over a synchro 2 micro guidewire into the distal right P2/PCA. Then, a 3 x 15 mm neural form atlas intracranial stent was deployed spanning the P1 and proximal P2 segments, across the stenosis. Left vertebral artery angiograms with frontal and lateral views of the head showed adequate stent positioning across the stenosis, however, there was significant residual stenosis. Under biplane roadmap an SL 10 microcatheter was navigated over a synchro 2 micro guidewire into the recently deployed stent. The microcatheter was then exchanged over the wire for a 1.5 x 15 mm Gateway balloon. In stent angioplasty was performed under fluoroscopy. Left vertebral artery angiograms with frontal and lateral views of the head showed improved anterograde flow and improved degree of stenosis with mild residual stenosis. Flat panel CT of the head was obtained and post processed in a separate workstation with concurrent attending physician supervision. Selected images were sent to PACS. No evidence of hemorrhagic complication. The catheter was subsequently withdrawn. An inflatable band was placed and inflated over the left hand access site. The vascular sheath was withdrawn and the band was slowly deflated until brisk flow was noted through the arteriotomy site. At this point, the band was reinflated with additional 2 cc of air to obtain patent hemostasis. IMPRESSION: Successful mechanical thrombectomy followed by stenting and angioplasty for treatment of a right P1/PCA occlusion with underlying proximal P2 segment stenosis. PLAN: Follow-up cerebral angiogram in 6 months to evaluate stent patency. Electronically Signed   By: Pedro Earls M.D.   On: 01/06/2021 15:15   ECHOCARDIOGRAM COMPLETE  Result Date: 01/06/2021    ECHOCARDIOGRAM REPORT   Patient Name:   YOLETTE HASTINGS Date of Exam: 01/06/2021 Medical Rec #:   [id number]       Height:       66.3 in Accession #:    [id number]      Weight:       206.0 lb Date of Birth:  January 30, 1951       BSA:          2.031 m Patient Age:    44 years        BP:           158/80 mmHg Patient Gender: F               HR:           70 bpm. Exam Location:  Inpatient Procedure: 2D Echo, Cardiac Doppler and Color Doppler Indications:    Stroke  History:        Patient has prior history of Echocardiogram examinations, most                 recent 01/10/2017. Arrythmias:Atrial Fibrillation; Risk                 Factors:Hypertension.  Sonographer:    Arnell Sieving  Danelle Berry (AE) Referring Phys: [phone] MCNEILL P Tyro  1. Left ventricular ejection fraction, by estimation, is >75%. The left ventricle has hyperdynamic function. The left ventricle has no regional wall motion abnormalities. There is moderate left ventricular hypertrophy. Left ventricular diastolic parameters are consistent with Grade II diastolic dysfunction (pseudonormalization).  2. Right ventricular systolic function is normal. The right ventricular size is normal. There is normal pulmonary artery systolic pressure.  3. Left atrial size was mildly dilated.  4. The mitral valve is normal in structure. Mild mitral valve regurgitation. No evidence of mitral stenosis.  5. The aortic valve is calcified. Aortic valve regurgitation is mild to moderate. Mild aortic valve sclerosis is present, with no evidence of aortic valve stenosis.  6. The inferior vena cava is normal in size with greater than 50% respiratory variability, suggesting right atrial pressure of 3 mmHg. Conclusion(s)/Recommendation(s): No intracardiac source of embolism detected on this transthoracic study. A transesophageal echocardiogram is recommended to exclude cardiac source of embolism if clinically indicated. FINDINGS  Left Ventricle: Left ventricular ejection fraction, by estimation, is >75%. The left ventricle has hyperdynamic function. The left ventricle has no  regional wall motion abnormalities. The left ventricular internal cavity size was normal in size. There is moderate left ventricular hypertrophy. Left ventricular diastolic parameters are consistent with Grade II diastolic dysfunction (pseudonormalization). Right Ventricle: The right ventricular size is normal. No increase in right ventricular wall thickness. Right ventricular systolic function is normal. There is normal pulmonary artery systolic pressure. The tricuspid regurgitant velocity is 2.69 m/s, and  with an assumed right atrial pressure of 3 mmHg, the estimated right ventricular systolic pressure is 51.1 mmHg. Left Atrium: Left atrial size was mildly dilated. Right Atrium: Right atrial size was normal in size. Pericardium: There is no evidence of pericardial effusion. Mitral Valve: The mitral valve is normal in structure. Mild mitral valve regurgitation. No evidence of mitral valve stenosis. MV peak gradient, 8.2 mmHg. The mean mitral valve gradient is 3.0 mmHg. Tricuspid Valve: The tricuspid valve is normal in structure. Tricuspid valve regurgitation is mild . No evidence of tricuspid stenosis. Aortic Valve: The aortic valve is calcified. Aortic valve regurgitation is mild to moderate. Aortic regurgitation PHT measures 373 msec. Mild aortic valve sclerosis is present, with no evidence of aortic valve stenosis. Aortic valve mean gradient measures 3.0 mmHg. Aortic valve peak gradient measures 5.1 mmHg. Aortic valve area, by VTI measures 2.95 cm. Pulmonic Valve: The pulmonic valve was normal in structure. Pulmonic valve regurgitation is not visualized. No evidence of pulmonic stenosis. Aorta: The aortic root is normal in size and structure. Venous: The inferior vena cava is normal in size with greater than 50% respiratory variability, suggesting right atrial pressure of 3 mmHg. IAS/Shunts: No atrial level shunt detected by color flow Doppler.  LEFT VENTRICLE PLAX 2D LVIDd:         3.20 cm  Diastology LVIDs:          2.30 cm  LV e' medial:    7.83 cm/s LV PW:         1.40 cm  LV E/e' medial:  16.2 LV IVS:        1.40 cm  LV e' lateral:   9.46 cm/s LVOT diam:     2.20 cm  LV E/e' lateral: 13.4 LV SV:         77 LV SV Index:   38 LVOT Area:     3.80 cm  RIGHT VENTRICLE  IVC RV Basal diam:  3.10 cm     IVC diam: 1.20 cm RV S prime:     12.20 cm/s TAPSE (M-mode): 2.5 cm LEFT ATRIUM             Index       RIGHT ATRIUM           Index LA diam:        3.80 cm 1.87 cm/m  RA Area:     17.90 cm LA Vol (A2C):   66.7 ml 32.84 ml/m RA Volume:   46.10 ml  22.70 ml/m LA Vol (A4C):   69.1 ml 34.02 ml/m LA Biplane Vol: 73.3 ml 36.09 ml/m  AORTIC VALVE AV Area (Vmax):    3.36 cm AV Area (Vmean):   3.12 cm AV Area (VTI):     2.95 cm AV Vmax:           113.00 cm/s AV Vmean:          77.000 cm/s AV VTI:            0.260 m AV Peak Grad:      5.1 mmHg AV Mean Grad:      3.0 mmHg LVOT Vmax:         100.00 cm/s LVOT Vmean:        63.200 cm/s LVOT VTI:          0.202 m LVOT/AV VTI ratio: 0.78 AI PHT:            373 msec  AORTA Ao Root diam: 3.40 cm Ao Asc diam:  3.30 cm MITRAL VALVE                TRICUSPID VALVE MV Area (PHT): 2.48 cm     TR Peak grad:   28.9 mmHg MV Area VTI:   2.05 cm     TR Vmax:        269.00 cm/s MV Peak grad:  8.2 mmHg MV Mean grad:  3.0 mmHg     SHUNTS MV Vmax:       1.43 m/s     Systemic VTI:  0.20 m MV Vmean:      79.6 cm/s    Systemic Diam: 2.20 cm MV Decel Time: 306 msec MV E velocity: 127.00 cm/s MV A velocity: 85.90 cm/s MV E/A ratio:  1.48 Candee Furbish MD Electronically signed by Candee Furbish MD Signature Date/Time: 01/06/2021/1:21:20 PM    Final    IR PERCUTANEOUS ART THROMBECTOMY/INFUSION INTRACRANIAL INC DIAG ANGIO  Result Date: 01/06/2021 INDICATION: 70 year old female with past medical history of hypertension and atrial fibrillation on Eliquis presenting to [facility] with acute onset dizziness, slurred speech, left hemianopia and left-sided weakness. Her last known well was  11:25 a.m. on 01/05/2021. NIHSS at presentation was 15 with a baseline modified Rankin scale of 0. Head CT showed no evidence of large acute infarct or hemorrhage (ASPECTS 10). No IV thrombolytic administered due to Eliquis administration the night before. CT angiogram of the head and neck showed a right PCA/P1 segment occlusion. She was transferred to our service for a diagnostic cerebral angiogram and mechanical thrombectomy. EXAM: ULTRASOUND-GUIDED VASCULAR ACCESS DIAGNOSTIC CEREBRAL ANGIOGRAM MECHANICAL THROMBECTOMY FLAT PANEL HEAD CT INTRACRANIAL STENTING AND ANGIOPLASTY FLAT PANEL HEAD CT COMPARISON:  CT/CT angiogram of the head and neck January 05, 2021. MEDICATIONS: Cangrelor IV bolus and drip. 180 mg of ticagrelor and 325 mg of aspirin via OG tube. ANESTHESIA/SEDATION: The procedure was performed under general anesthesia. CONTRAST:  140 mL of Omnipaque 240 milligram/mL FLUOROSCOPY TIME:  Fluoroscopy Time: 63 minutes 6 seconds (2910 mGy). COMPLICATIONS: None immediate. TECHNIQUE: Informed written consent was obtained from the patient's husband after a thorough discussion of the procedural risks, benefits and alternatives. All questions were addressed. Maximal Sterile Barrier Technique was utilized including caps, mask, sterile gowns, sterile gloves, sterile drape, hand hygiene and skin antiseptic. A timeout was performed prior to the initiation of the procedure. Using the modified Seldinger technique and a micropuncture kit, access was gained to the distal left radial artery at the anatomical snuffbox and a 7 French sheath was placed. Real-time ultrasound guidance was utilized for vascular access including the acquisition of a permanent ultrasound image documenting patency of the accessed vessel. Slow intra arterial infusion of 300 mcg nitroglycerin diluted in patient's own blood was performed. No significant fluctuation in patient's blood pressure seen. Then, a left radial artery angiogram was obtained  via sheath side port. Next, a 5 [place] JB 2 glide catheter was navigated over a 0.035" [place]umo Glidewire into the proximal left subclavian artery under fluoroscopic guidance. Frontal and lateral angiograms of the neck were obtained. Under biplane roadmap, the catheter was then advanced into the V2 segment of the left vertebral artery. The catheter was exchanged over the wire and under biplane roadmap for a 7 [place] Rist catheter which was placed in the left V2-V3 junction. Frontal and lateral angiograms of the head were obtained. FINDINGS: 1. Normal brachial artery branching pattern seen. No significant anatomical variation. The left radial artery caliber is adequate for vascular access. 2. Occlusion of the right P1/PCA segment. PROCEDURE: Under biplane roadmap, a 5 [place] aspiration catheter was navigated over an Aristotle 14 microguidewire into the basilar artery. The aspiration catheter was then advanced to the level of occlusion in the right PCA and connected to an aspiration pump. Continuous aspiration was performed for 2 minutes. The guide catheter was connected to a VacLok syringe. The aspiration catheter was subsequently removed under constant aspiration. The guide catheter was aspirated for debris. Left vertebral artery angiograms with magnified frontal and lateral views of the head showed recanalization of the right PCA (TICI 3) with brisk distal flow. Underlying severe stenosis at the proximal P2 segment was noted. Intra arterial infusion 5 mg of verapamil was performed. Delayed angiograms showed slow flow in the right PCA distal to the stenosis without improvement of the stenosis. Under biplane roadmap, a [place] aspiration catheter was navigated over an Aristotle 14 microguidewire into the basilar artery. The aspiration catheter was then advanced to the level of occlusion in the right PCA and connected to an aspiration pump. Continuous aspiration was performed for 2 minutes. The guide  catheter was connected to a VacLok syringe. The aspiration catheter was subsequently removed under constant aspiration. The guide catheter was aspirated for debris. Left vertebral artery angiograms with magnified frontal and lateral views of the head showed improved anterograde flow with persistent stenosis. The [place] catheter was navigated over the wire into the right P1/PCA and intra arterial infusion 5 mg of verapamil was performed directly into the right ICA. Delayed angiograms showed slow flow in the right PCA distal to the stenosis without improvement of the stenosis. Under biplane roadmap, a [place] aspiration catheter was navigated over a phenom 21 microcatheter and a Aristotle 14 microguidewire into the basilar artery. The microcatheter was then navigated over the wire into the distal right P2/PCA. Then, a 3 mm solitaire stent retriever was deployed spanning the  right P1 and P2 segment. The device was allowed to intercalated with the clot for 4 minutes. The microcatheter was removed. The aspiration catheter was advanced to the level of occlusion and connected to a penumbra aspiration pump. The thrombectomy device and aspiration catheter were removed under constant aspiration. Left vertebral artery angiograms with magnified frontal and lateral views of the head again showed improved anterograde flow with persistent stenosis. Flat panel CT of the head was obtained and post processed in a separate workstation with concurrent attending physician supervision. Selected images were sent to PACS. No evidence of hemorrhagic transformation. Delayed left vertebral artery angiograms with multiple magnified oblique views of the head showed persistent stenosis of the proximal right P2 segment with progressive slowed flow. Patient was loaded on Cangrelor followed by continuous infusion. Under biplane roadmap an SL 10 microcatheter was navigated over a synchro 2 micro guidewire into the distal right P2/PCA. Then, a  3 x 15 mm neural form atlas intracranial stent was deployed spanning the P1 and proximal P2 segments, across the stenosis. Left vertebral artery angiograms with frontal and lateral views of the head showed adequate stent positioning across the stenosis, however, there was significant residual stenosis. Under biplane roadmap an SL 10 microcatheter was navigated over a synchro 2 micro guidewire into the recently deployed stent. The microcatheter was then exchanged over the wire for a 1.5 x 15 mm Gateway balloon. In stent angioplasty was performed under fluoroscopy. Left vertebral artery angiograms with frontal and lateral views of the head showed improved anterograde flow and improved degree of stenosis with mild residual stenosis. Flat panel CT of the head was obtained and post processed in a separate workstation with concurrent attending physician supervision. Selected images were sent to PACS. No evidence of hemorrhagic complication. The catheter was subsequently withdrawn. An inflatable band was placed and inflated over the left hand access site. The vascular sheath was withdrawn and the band was slowly deflated until brisk flow was noted through the arteriotomy site. At this point, the band was reinflated with additional 2 cc of air to obtain patent hemostasis. IMPRESSION: Successful mechanical thrombectomy followed by stenting and angioplasty for treatment of a right P1/PCA occlusion with underlying proximal P2 segment stenosis. PLAN: Follow-up cerebral angiogram in 6 months to evaluate stent patency. Electronically Signed   By: Pedro Earls M.D.   On: 01/06/2021 15:15   CT HEAD CODE STROKE WO CONTRAST  Result Date: 01/05/2021 CLINICAL DATA:  Code stroke.  Neuro deficit, acute, stroke suspected EXAM: CT HEAD WITHOUT CONTRAST TECHNIQUE: Contiguous axial images were obtained from the base of the skull through the vertex without intravenous contrast. COMPARISON:  None. FINDINGS: Brain: There is  no acute intracranial hemorrhage, mass effect, or edema. Gray-white differentiation is preserved. Age-indeterminate but likely chronic small left cerebellar infarcts. Questionable small area of ill-defined low-density involving anterior limb of right internal capsule. Ventricles and sulci are within normal limits in size and configuration. No extra-axial collection. Vascular: No hyperdense vessel. Skull: Unremarkable. Sinuses/Orbits: No acute abnormality. Other: Mastoid air cells are clear. ASPECTS ([place] Stroke Program Early CT Score) - Ganglionic level infarction (caudate, lentiform nuclei, internal capsule, insula, M1-M3 cortex): 7 - Supraganglionic infarction (M4-M6 cortex): 3 Total score (0-10 with 10 being normal): 10 IMPRESSION: There is no acute intracranial hemorrhage or definite evidence of acute infarction. ASPECT score is 10. Questionable small area of low density involving anterior limb of right internal capsule could reflect chronic microvascular ischemic change or age-indeterminate infarct. Age-indeterminate but  likely chronic small left cerebellar infarcts. Initial results were called by telephone at the time of interpretation on 01/05/2021 at 12:23 pm to provider Sherwood Gambler , who verbally acknowledged these results. Electronically Signed   By: Macy Mis M.D.   On: 01/05/2021 12:26   CT ANGIO HEAD NECK W WO CM W PERF (CODE STROKE)  Result Date: 01/05/2021 CLINICAL DATA:  Neuro deficit, acute, stroke suspected EXAM: CT ANGIOGRAPHY HEAD AND NECK CT PERFUSION BRAIN TECHNIQUE: Multidetector CT imaging of the head and neck was performed using the standard protocol during bolus administration of intravenous contrast. Multiplanar CT image reconstructions and MIPs were obtained to evaluate the vascular anatomy. Carotid stenosis measurements (when applicable) are obtained utilizing NASCET criteria, using the distal internal carotid diameter as the denominator. Multiphase CT imaging of the brain  was performed following IV bolus contrast injection. Subsequent parametric perfusion maps were calculated using RAPID software. CONTRAST:  69m OMNIPAQUE IOHEXOL 350 MG/ML SOLN COMPARISON:  None. FINDINGS: CTA NECK Aortic arch: Unremarkable with patent great vessel origins. Right carotid system: Patent. Minimal calcified plaque at the ICA origin without stenosis. Left carotid system: Patent. Mild mixed plaque at the ICA origin without stenosis. Vertebral arteries: Patent.  No stenosis. Skeleton: Cervical spine degenerative changes with left facet predominant degeneration. Other neck: Unremarkable. Upper chest: No apical lung mass. Review of the MIP images confirms the above findings CTA HEAD Anterior circulation: Intracranial internal carotid arteries are patent with calcified plaque causing mild stenosis. Anterior and middle cerebral arteries are patent. Posterior circulation: Intracranial vertebral arteries are patent. Basilar artery is patent. Major cerebellar artery origins are patent. The proximal right P1 PCA is patent. There is subsequent occlusion to the level of the proximal P2 PCA where there is partial reconstitution. Venous sinuses: Patent as allowed by contrast bolus timing. Review of the MIP images confirms the above findings CT Brain Perfusion Findings: CBF (<30%) Volume: 063mPerfusion (Tmax>6.0s) volume: 63m37mismatch Volume: 0mL64mfarction Location: Right occipital lobe. IMPRESSION: Occlusion of the right PCA just beyond the proximal P1 segment. There is subsequent partial reconstitution at the level of the proximal right P2 segment. Perfusion imaging demonstrates 6 mL of penumbra within the right occipital lobe. No hemodynamically significant stenosis in the neck. These results were communicated to Dr. StacQuinn Axe1:24 pm on 01/05/2021 by text page via the [facility] system. Electronically Signed   By: PranMacy Mis.   On: 01/05/2021 13:32      HISTORY OF PRESENT ILLNESS  Ms. SusaKALLA WATSONa 70 y35. female  with a history of atrial fibrillation on Eliquis [facility].  She had gone to her bank and was leaving when this started.  [facility] Dr. StacQuinn Axe examined the patient and found her to have an NIH of 15.  She had a CT angiogram demonstrating large vessel occlusion of the right P1.  After discussion, there was consideration that this could be a symptomatic lesion through thalamic involvement and therefore the decision was made to pursue thrombectomy given the severity of the symptoms.   HOSPITAL COURSE Mrs. SusaMikaela Hilgeman taken for thrombecomy and admitted to the neurologic ICU where she improved and remained stable. She was cleared by therapists with no needs.  Stroke:  right BG and thalamus acute infarcts embolic secondary to a. Fib, and missing eliquis dose.  Due to right PCA occlusion s/p mechanical  thrombectomy Code Stroke CT head There is no acute intracranial hemorrhage or definite evidence of acute infarction. ASPECT score is 10. CTA head & neck Occlusion of the right PCA just beyond the proximal P1 segment.There is subsequent partial reconstitution at the level of the proximal right P2 segment. CT perfusion :penumbra 6 ml MRI  1. Multiple small foci of acute/early subacute ischemia within both hemispheres, with largest lesions in the right basal ganglia and thalamus. No hemorrhage or mass effect. 2. Old left cerebellar infarct and mild findings of chronic small vessel disease. 2D Echo EF >75%, moderate LVH,  Grade II diastolic dysfunction, Left atrial size was mildly dilated. No thrombus, wall motion abnormality or shunt found.   LDL 131 HgbA1c 5.5 VTE prophylaxis - Eliquis       Diet    Diet regular Room service appropriate? Yes; Fluid consistency: Thin      S/p Successful mechanical thrombectomy followed by stenting and angioplasty for  treatment of a right P1/PCA occlusion with underlying proximal P2 segment stenosis. Eliquis (apixaban) daily prior to admission, now on Eliquis (apixaban) daily.  Therapy recommendations:  no needs Disposition:  Home   Hypertension Home meds:  lopressor Stable Permissive hypertension (OK if < 220/120) but gradually normalize in 5-7 days Long-term BP goal normotensive   Hyperlipidemia Home meds:  none,  LDL 131, goal < 70 Start Atorvastatin 40 mg daily  Continue statin at discharge   Diabetes type II Controlled (no diagnosis) Home meds:  none HgbA1c 5.5, goal < 7.0 CBGs Recent Labs (last 2 labs)        Recent Labs    01/06/21 0000 01/06/21 0340 01/06/21 0745  GLUCAP 122* 129* 87        Other Stroke Risk Factors Advanced Age >/= 65  Obesity, Body mass index is 33 kg/m., BMI >/= 30 associated with increased stroke risk, recommend weight loss, diet and exercise as appropriate     DISCHARGE EXAM Blood pressure (!) 146/73, pulse 68, temperature 99.1 F (37.3 C), temperature source Oral, resp. rate 19, weight 93.4 kg, SpO2 99 %.  Pleasant middle-age Caucasian lady not in distress. Afebrile. Head is nontraumatic. Neck is supple without bruit. Cardiac exam no murmur or gallop. Lungs are clear to auscultation. Distal pulses are well felt.   Neurological Exam ;  Awake  Alert oriented x 3. Normal speech and language.eye movements full without nystagmus.fundi were not visualized. Vision acuity and fields appear normal. Hearing is normal. Palatal movements are normal. Face symmetric. Tongue midline. Normal strength, tone, reflexes and coordination. Normal sensation. Gait deferred. Discharge Diet       Diet   Diet regular Room service appropriate? Yes; Fluid consistency: Thin   liquids  DISCHARGE PLAN Disposition:  Home Eliquis and Brilinta due to PCA stent for 1 week and then Eliquis and aspirin for 6 months  Ongoing stroke risk factor control by Primary Care Physician at  time of discharge Follow-up PCP Caren Macadam, MD in 2 weeks. Follow-up in [facility] in 4 weeks, office to schedule an appointment.   35 minutes were spent preparing discharge.  This patient was seen and evaluated with Dr. Leonie Man. He directed the plan of care.  Charlene Brooke, NP-C    I have personally obtained history,examined this patient, reviewed notes, independently viewed imaging studies, participated in medical decision making and plan of care.ROS completed by me personally and pertinent positives fully documented  I have made any additions or clarifications directly to the above  note. Agree with note above.    Antony Contras, MD Medical Director [facility] Pager: [phone] 01/07/2021 5:49 PM

## 2021-01-07 NOTE — Progress Notes (Signed)
Renee Benitez to be D/C'd Home per MD order.  Discussed with the patient and all questions fully answered.  VSS, Skin clean, dry and intact without evidence of skin break down, no evidence of skin tears noted. IV catheter discontinued intact. Site without signs and symptoms of complications. Dressing and pressure applied.  An After Visit Summary was printed and given to the patient. Patient received prescription.  D/c education completed with patient/family including follow up instructions, medication list, d/c activities limitations if indicated, with other d/c instructions as indicated by MD - patient able to verbalize understanding, all questions fully answered.   Patient instructed to return to ED, call 911, or call MD for any changes in condition.   Patient escorted via WC, and D/C home via private auto.   Medication deliver at bedside prior to D/C.   Renee Benitez A Leticia Clas Montejano 01/07/2021 1:16 PM

## 2021-01-07 NOTE — Discharge Instructions (Addendum)
IT IS EXTREMELY IMPORTANT THAT YOU TAKE YOUR ELIQUIS AS PRESCRIBED. FOR ONE WEEK PLEASE TAKE BRILINTA WITH ELIQUIS THEN STOP TAKING BRILINTA AND START TAKING ASPIRIN WITH THE ELIQUIS.  MAKE APPOINTMENT WITH DR. Pearlean Brownie IN 6-8 WEEKS AT THE NUMBER ABOVE.   MAKE APPOINTMENT WITH PRIMARY CARE PROVIDER IN 1-2 WEEKS FOR HOSPITAL FOLLOW UP.

## 2021-01-19 ENCOUNTER — Other Ambulatory Visit: Payer: Self-pay | Admitting: Gastroenterology

## 2021-01-19 NOTE — Telephone Encounter (Signed)
Sending in 3 month supply of Protonix. Patient needs OV for additional refills. Hasn't been seen since 2020.

## 2021-01-20 ENCOUNTER — Telehealth: Payer: Self-pay | Admitting: Pharmacist

## 2021-01-20 ENCOUNTER — Other Ambulatory Visit: Payer: Self-pay

## 2021-01-20 ENCOUNTER — Encounter: Payer: Self-pay | Admitting: Internal Medicine

## 2021-01-20 NOTE — Telephone Encounter (Signed)
Spoke to pt. Informed her of recommendations. Pt informed me that she has a OV with PCP 10/11. Will ask if PCP can take over writing the prescription for her Protonix since she is not having any problems.

## 2021-01-20 NOTE — Telephone Encounter (Signed)
Noted  

## 2021-01-20 NOTE — Telephone Encounter (Signed)
Transitions of Care Pharmacy  ° °Call attempted for a pharmacy transitions of care follow-up. HIPAA appropriate voicemail was left with call back information provided.  ° °Call attempt #1. Will follow-up in 2-3 days.  °  °

## 2021-01-21 ENCOUNTER — Telehealth (HOSPITAL_COMMUNITY): Payer: Self-pay | Admitting: Physician Assistant

## 2021-01-21 NOTE — Telephone Encounter (Signed)
Called and left message for patient to call back.  Patient needs follow-up appt per PCP.

## 2021-01-21 NOTE — Telephone Encounter (Signed)
Patient returned my call and is agreeable to appt 01/28/21 with Jorja Loa, PA-C.

## 2021-01-27 NOTE — Progress Notes (Signed)
Primary Care Physician: Aliene Beams, MD Primary Cardiologist: Dr Diona Browner  Primary Electrophysiologist: Dr Ladona Ridgel Referring Physician: Nena Polio NP   Renee Benitez is a 70 y.o. female with a history of HTN, CVA, and atrial fibrillation who presents for follow up in the [facility]. Patient is on Eliquis for a CHADS2VASC score of 5. She has been maintained on flecainide and diltiazem but called in with symptoms of ongoing palpitations and SOB with exertion on 07/22/20. She admits that her symptoms had been ongoing for about 2 months. ECG 07/29/20 showed atypical atrial flutter with rapid rates. Her diltiazem was increased. She was also started on metoprolol which converted her to SR.  On follow up today, patient was hospitalized with left sided weakness on 01/05/21 and was diagnosed with an acute CVA. TPA was not given because she was on Eliquis but she did have successful mechanical thrombectomy of PCA with stenting and angioplasty. She reports that she was 3 hours late taking her Eliquis that day but did not miss any doses. She has been referred by her PCP for a hypercoagulable evaluation with hematology. Patient reports today that she has been in afib for two days with an elevated heart rate. There were no specific triggers she can identify. Overall, she has noticed an increased burden of afib with 1-2 episodes every month.   Today, she denies symptoms of chest pain, shortness of breath, orthopnea, PND, lower extremity edema, dizziness, presyncope, syncope, snoring, daytime somnolence, bleeding, or neurologic sequela. The patient is tolerating medications without difficulties and is otherwise without complaint today.    Atrial Fibrillation Risk Factors:  she does not have symptoms or diagnosis of sleep apnea. she does not have a history of rheumatic fever.   she has a BMI of Body mass index is 31.46 kg/m.Marland Kitchen Filed Weights   01/28/21 0904  Weight: 89.1 kg       Family History  Problem Relation Age of Onset   Coronary artery disease Father    Cancer Father        bladder cancer   Arrhythmia Sister        Reportedly had ablation of SVT   Hypertension Sister    Colon cancer Sister 16       s/p surgical resection, no chemo/radiation   Colon cancer Maternal Grandmother    Hypertension Son      Atrial Fibrillation Management history:  Previous antiarrhythmic drugs: flecainide  Previous cardioversions: 2018 Previous ablations: none CHADS2VASC score: 5 Anticoagulation history: Eliquis   Past Medical History:  Diagnosis Date   Atrial fibrillation (HCC)    Current use of estrogen therapy 01/31/2013   Dysrhythmia    AFib   Hypertension    Macular degeneration    Menopausal syndrome    Right ovarian cyst 01/25/2014   Past Surgical History:  Procedure Laterality Date   ABDOMINAL HYSTERECTOMY     APPENDECTOMY     CARDIOVERSION N/A 09/14/2016   Procedure: CARDIOVERSION;  Surgeon: [name], MD;  Location: [facility];  Service: Cardiovascular;  Laterality: N/A;   CATARACT EXTRACTION W/ INTRAOCULAR LENS  IMPLANT, BILATERAL Bilateral    COLONOSCOPY  12/04/03   Friable anal canal otherwise normal rectum and colon   COLONOSCOPY N/A 03/19/2013   Dr. [name]: diverticulosis, hemorrhoids. next TCS 10 years   COLONOSCOPY N/A 05/02/2019   Procedure: COLONOSCOPY;  Surgeon: [name], MD;  Location: [facility];  Service: Endoscopy;  Laterality: N/A;  7:30am  ESOPHAGOGASTRODUODENOSCOPY N/A 10/17/2018   Procedure: ESOPHAGOGASTRODUODENOSCOPY (EGD);  Surgeon: [name], MD;  [facility]: [facility];  Service: Endoscopy;  Laterality: N/A;  3:00pm   IR CT HEAD LTD  01/05/2021   IR CT HEAD LTD  01/05/2021   IR INTRA CRAN STENT  01/05/2021   IR PERCUTANEOUS ART THROMBECTOMY/INFUSION INTRACRANIAL INC DIAG ANGIO  01/05/2021   IR US GUIDE VASC ACCESS LEFT  01/05/2021   MALONEY DILATION N/A 10/17/2018   Procedure: Elease Hashimoto DILATION;  Surgeon:  [name], MD;  [facility]: [facility];  Service: Endoscopy;  Laterality: N/A;   RADIOLOGY WITH ANESTHESIA N/A 01/05/2021   Procedure: IR WITH ANESTHESIA;  Surgeon: Radiologist, Medication, MD;  Location: [facility];  Service: Radiology;  Laterality: N/A;   TEE WITHOUT CARDIOVERSION N/A 09/14/2016   Procedure: TRANSESOPHAGEAL ECHOCARDIOGRAM (TEE) WITH PROPOFOL;  Surgeon: [name], MD;  Location: [facility];  Service: Cardiovascular;  Laterality: N/A;   TONSILECTOMY, ADENOIDECTOMY, BILATERAL MYRINGOTOMY AND TUBES      Current Outpatient Medications  Medication Sig Dispense Refill   acetaminophen (TYLENOL) 500 MG tablet Take 1,000 mg by mouth every 6 (six) hours as needed for moderate pain or headache.     aspirin 81 MG EC tablet Take 1 tablet (81 mg total) by mouth daily. 150 tablet 2   Calcium Carb-Cholecalciferol (CALCIUM 600 + D PO) Take 1 tablet by mouth daily.     Cholecalciferol (DIALYVITE VITAMIN D 5000) 125 MCG (5000 UT) capsule Take 5,000 Units by mouth daily.     diltiazem (CARDIZEM) 60 MG tablet Take 60 mg by mouth daily as needed (afib).     diltiazem (CARTIA XT) 180 MG 24 hr capsule Take 1 capsule (180 mg total) by mouth daily. 180 capsule 3   ELIQUIS 5 MG TABS tablet TAKE ONE TABLET (5MG  TOTAL) BY MOUTH TWOTIMES DAILY 180 tablet 3   escitalopram (LEXAPRO) 5 MG tablet Take 5 mg by mouth daily.     flecainide (TAMBOCOR) 100 MG tablet Take 1 tablet (100 mg total) by mouth 2 (two) times daily. 180 tablet 3   levothyroxine (SYNTHROID) 50 MCG tablet Take 50 mcg by mouth daily.     losartan (COZAAR) 25 MG tablet Take 25 mg by mouth daily.     Magnesium 250 MG TABS Take 250 mg by mouth at bedtime.      metoprolol tartrate (LOPRESSOR) 25 MG tablet Take 1 tablet by mouth every 8 hours as needed for breakthrough afib 60 tablet 3   Multiple Vitamins-Minerals (MULTIVITAMIN WITH MINERALS) tablet Take 1 tablet by mouth daily.     Multiple Vitamins-Minerals (PRESERVISION AREDS 2) CAPS Take  1 capsule by mouth 2 (two) times daily.     Omega-3 Fatty Acids (FISH OIL ULTRA) 1400 MG CAPS Take 1,400 mg by mouth daily.     pantoprazole (PROTONIX) 40 MG tablet Take 1 tablet (40 mg total) by mouth daily before breakfast. 90 tablet 0   rosuvastatin (CRESTOR) 20 MG tablet Take 20 mg by mouth at bedtime.     No current facility-administered medications for this encounter.    No Known Allergies  Social History   Socioeconomic History   Marital status: Married    Spouse name: Not on file   Number of children: Not on file   Years of education: Not on file   Highest education level: Not on file  Occupational History   Occupation: Nurse    Comment: [employer]  Tobacco Use   Smoking  status: Never   Smokeless tobacco: Never   Tobacco comments:    Never smoked  Vaping Use   Vaping Use: Never used  Substance and Sexual Activity   Alcohol use: Not Currently    Alcohol/week: 1.0 standard drink    Types: 1 Glasses of wine per week    Comment: occ wine   Drug use: No   Sexual activity: Yes    Birth control/protection: Surgical    Comment: hyst  Other Topics Concern   Not on file  Social History Narrative   Not on file   Social Determinants of Health   Financial Resource Strain: Low Risk    Difficulty of Paying Living Expenses: Not hard at all  Food Insecurity: No Food Insecurity   Worried About Programme researcher, broadcasting/film/video in the Last Year: Never true   Barista in the Last Year: Never true  Transportation Needs: No Transportation Needs   Lack of Transportation (Medical): No   Lack of Transportation (Non-Medical): No  Physical Activity: Insufficiently Active   Days of Exercise per Week: 1 day   Minutes of Exercise per Session: 30 min  Stress: Stress Concern Present   Feeling of Stress : To some extent  Social Connections: Socially Integrated   Frequency of Communication with Friends and Family: Three times a week   Frequency of Social Gatherings with Friends and  Family: Twice a week   Attends Religious Services: More than 4 times per year   Active Member of [employer] or Organizations: Yes   Attends Engineer, structural: More than 4 times per year   Marital Status: Married  Catering manager Violence: Not At Risk   Fear of Current or Ex-Partner: No   Emotionally Abused: No   Physically Abused: No   Sexually Abused: No     ROS- All systems are reviewed and negative except as per the HPI above.  Physical Exam: Vitals:   01/28/21 0904  BP: 114/74  Pulse: (!) 109  Weight: 89.1 kg  Height: 5' 6.25" (1.683 m)    GEN- The patient is a well appearing obese female, alert and oriented x 3 today.   HEENT-head normocephalic, atraumatic, sclera clear, conjunctiva pink, hearing intact, trachea midline. Lungs- Clear to ausculation bilaterally, normal work of breathing Heart- irregular rate and rhythm, no murmurs, rubs or gallops  GI- soft, NT, ND, + BS Extremities- no clubbing, cyanosis, or edema MS- no significant deformity or atrophy Skin- no rash or lesion Psych- euthymic mood, full affect Neuro- strength and sensation are intact   Wt Readings from Last 3 Encounters:  01/28/21 89.1 kg  01/05/21 93.4 kg  12/01/20 89.7 kg    EKG today demonstrates  Atrial flutter with predominantly 2:1 block Vent. rate 109 BPM PR interval * ms QRS duration 112 ms QT/QTcB 294/395 ms  Echo 01/06/21 demonstrated  1. Left ventricular ejection fraction, by estimation, is >75%. The left  ventricle has hyperdynamic function. The left ventricle has no regional  wall motion abnormalities. There is moderate left ventricular hypertrophy.  Left ventricular diastolic  parameters are consistent with Grade II diastolic dysfunction  (pseudonormalization).   2. Right ventricular systolic function is normal. The right ventricular  size is normal. There is normal pulmonary artery systolic pressure.   3. Left atrial size was mildly dilated.   4. The mitral valve  is normal in structure. Mild mitral valve  regurgitation. No evidence of mitral stenosis.   5. The aortic valve is calcified.  Aortic valve regurgitation is mild to  moderate. Mild aortic valve sclerosis is present, with no evidence of  aortic valve stenosis.   6. The inferior vena cava is normal in size with greater than 50%  respiratory variability, suggesting right atrial pressure of 3 mmHg.   Conclusion(s)/Recommendation(s): No intracardiac source of embolism  detected on this transthoracic study. A transesophageal echocardiogram is  recommended to exclude cardiac source of embolism if clinically indicated.   Epic records are reviewed at length today  CHA2DS2-VASc Score = 5  The patient's score is based upon: CHF History: 0 HTN History: 1 Diabetes History: 0 Stroke History: 2 Vascular Disease History: 0 Age Score: 1 Gender Score: 1          ASSESSMENT AND PLAN: 1. Persistent Atrial Fibrillation/atrial flutter The patient's CHA2DS2-VASc score is 5, indicating a 7.2% annual risk of stroke.   Patient in atrial flutter today with borderline rates. Continue Lopressor 12.5 mg PRN q 6 hours for heart racing. (She has side effects on full pill) Will plan for DCCV. Check bmet/cbc. Continue flecainide 100 mg BID for now. We discussed alternate rhythm control strategies including changing AAD (dofetilide) vs ablation. Patient would like to continue flecainide for now. She will d/w Dr Taylor at her next visit.  Continue diltiazem 180 mg daily  Continue Eliquis 5 mg BID. ASA added for 6 months after recent CVA.  2. Secondary Hypercoagulable State (ICD10:  D68.69) The patient is at significant risk for stroke/thromboembolism based upon her CHA2DS2-VASc Score of 5.  Continue Apixaban (Eliquis).   3. Obesity Body mass index is 31.46 kg/m. Lifestyle modification was discussed and encouraged including regular physical activity and weight reduction.  4. HTN Stable, no changes  today.   Follow up in the AF clinic post DCCV.    Ricky Gurkaran Rahm PA-C [facility] [address] [place], [place] [address] [phone] 01/28/2021 9:13 AM  

## 2021-01-27 NOTE — H&P (View-Only) (Signed)
Primary Care Physician: Aliene Beams, MD Primary Cardiologist: Dr Diona Browner  Primary Electrophysiologist: Dr Ladona Ridgel Referring Physician: Nena Polio NP   Renee Benitez is a 70 y.o. female with a history of HTN, CVA, and atrial fibrillation who presents for follow up in the [facility]. Patient is on Eliquis for a CHADS2VASC score of 5. She has been maintained on flecainide and diltiazem but called in with symptoms of ongoing palpitations and SOB with exertion on 07/22/20. She admits that her symptoms had been ongoing for about 2 months. ECG 07/29/20 showed atypical atrial flutter with rapid rates. Her diltiazem was increased. She was also started on metoprolol which converted her to SR.  On follow up today, patient was hospitalized with left sided weakness on 01/05/21 and was diagnosed with an acute CVA. TPA was not given because she was on Eliquis but she did have successful mechanical thrombectomy of PCA with stenting and angioplasty. She reports that she was 3 hours late taking her Eliquis that day but did not miss any doses. She has been referred by her PCP for a hypercoagulable evaluation with hematology. Patient reports today that she has been in afib for two days with an elevated heart rate. There were no specific triggers she can identify. Overall, she has noticed an increased burden of afib with 1-2 episodes every month.   Today, she denies symptoms of chest pain, shortness of breath, orthopnea, PND, lower extremity edema, dizziness, presyncope, syncope, snoring, daytime somnolence, bleeding, or neurologic sequela. The patient is tolerating medications without difficulties and is otherwise without complaint today.    Atrial Fibrillation Risk Factors:  she does not have symptoms or diagnosis of sleep apnea. she does not have a history of rheumatic fever.   she has a BMI of Body mass index is 31.46 kg/m.Marland Kitchen Filed Weights   01/28/21 0904  Weight: 89.1 kg       Family History  Problem Relation Age of Onset   Coronary artery disease Father    Cancer Father        bladder cancer   Arrhythmia Sister        Reportedly had ablation of SVT   Hypertension Sister    Colon cancer Sister 16       s/p surgical resection, no chemo/radiation   Colon cancer Maternal Grandmother    Hypertension Son      Atrial Fibrillation Management history:  Previous antiarrhythmic drugs: flecainide  Previous cardioversions: 2018 Previous ablations: none CHADS2VASC score: 5 Anticoagulation history: Eliquis   Past Medical History:  Diagnosis Date   Atrial fibrillation (HCC)    Current use of estrogen therapy 01/31/2013   Dysrhythmia    AFib   Hypertension    Macular degeneration    Menopausal syndrome    Right ovarian cyst 01/25/2014   Past Surgical History:  Procedure Laterality Date   ABDOMINAL HYSTERECTOMY     APPENDECTOMY     CARDIOVERSION N/A 09/14/2016   Procedure: CARDIOVERSION;  Surgeon: [name], MD;  Location: [facility];  Service: Cardiovascular;  Laterality: N/A;   CATARACT EXTRACTION W/ INTRAOCULAR LENS  IMPLANT, BILATERAL Bilateral    COLONOSCOPY  12/04/03   Friable anal canal otherwise normal rectum and colon   COLONOSCOPY N/A 03/19/2013   Dr. [name]: diverticulosis, hemorrhoids. next TCS 10 years   COLONOSCOPY N/A 05/02/2019   Procedure: COLONOSCOPY;  Surgeon: [name], MD;  [facility];  Service: Endoscopy;  Laterality: N/A;  7:30am  ESOPHAGOGASTRODUODENOSCOPY N/A 10/17/2018   Procedure: ESOPHAGOGASTRODUODENOSCOPY (EGD);  Surgeon: [name], MD;  [facility]: [facility];  Service: Endoscopy;  Laterality: N/A;  3:00pm   IR CT HEAD LTD  01/05/2021   IR CT HEAD LTD  01/05/2021   IR INTRA CRAN STENT  01/05/2021   IR PERCUTANEOUS ART THROMBECTOMY/INFUSION INTRACRANIAL INC DIAG ANGIO  01/05/2021   IR US GUIDE VASC ACCESS LEFT  01/05/2021   MALONEY DILATION N/A 10/17/2018   Procedure: Elease Hashimoto DILATION;  Surgeon:  [name], MD;  [facility]: [facility];  Service: Endoscopy;  Laterality: N/A;   RADIOLOGY WITH ANESTHESIA N/A 01/05/2021   Procedure: IR WITH ANESTHESIA;  Surgeon: Radiologist, Medication, MD;  Location: [facility];  Service: Radiology;  Laterality: N/A;   TEE WITHOUT CARDIOVERSION N/A 09/14/2016   Procedure: TRANSESOPHAGEAL ECHOCARDIOGRAM (TEE) WITH PROPOFOL;  Surgeon: [name], MD;  Location: [facility];  Service: Cardiovascular;  Laterality: N/A;   TONSILECTOMY, ADENOIDECTOMY, BILATERAL MYRINGOTOMY AND TUBES      Current Outpatient Medications  Medication Sig Dispense Refill   acetaminophen (TYLENOL) 500 MG tablet Take 1,000 mg by mouth every 6 (six) hours as needed for moderate pain or headache.     aspirin 81 MG EC tablet Take 1 tablet (81 mg total) by mouth daily. 150 tablet 2   Calcium Carb-Cholecalciferol (CALCIUM 600 + D PO) Take 1 tablet by mouth daily.     Cholecalciferol (DIALYVITE VITAMIN D 5000) 125 MCG (5000 UT) capsule Take 5,000 Units by mouth daily.     diltiazem (CARDIZEM) 60 MG tablet Take 60 mg by mouth daily as needed (afib).     diltiazem (CARTIA XT) 180 MG 24 hr capsule Take 1 capsule (180 mg total) by mouth daily. 180 capsule 3   ELIQUIS 5 MG TABS tablet TAKE ONE TABLET (5MG  TOTAL) BY MOUTH TWOTIMES DAILY 180 tablet 3   escitalopram (LEXAPRO) 5 MG tablet Take 5 mg by mouth daily.     flecainide (TAMBOCOR) 100 MG tablet Take 1 tablet (100 mg total) by mouth 2 (two) times daily. 180 tablet 3   levothyroxine (SYNTHROID) 50 MCG tablet Take 50 mcg by mouth daily.     losartan (COZAAR) 25 MG tablet Take 25 mg by mouth daily.     Magnesium 250 MG TABS Take 250 mg by mouth at bedtime.      metoprolol tartrate (LOPRESSOR) 25 MG tablet Take 1 tablet by mouth every 8 hours as needed for breakthrough afib 60 tablet 3   Multiple Vitamins-Minerals (MULTIVITAMIN WITH MINERALS) tablet Take 1 tablet by mouth daily.     Multiple Vitamins-Minerals (PRESERVISION AREDS 2) CAPS Take  1 capsule by mouth 2 (two) times daily.     Omega-3 Fatty Acids (FISH OIL ULTRA) 1400 MG CAPS Take 1,400 mg by mouth daily.     pantoprazole (PROTONIX) 40 MG tablet Take 1 tablet (40 mg total) by mouth daily before breakfast. 90 tablet 0   rosuvastatin (CRESTOR) 20 MG tablet Take 20 mg by mouth at bedtime.     No current facility-administered medications for this encounter.    No Known Allergies  Social History   Socioeconomic History   Marital status: Married    Spouse name: Not on file   Number of children: Not on file   Years of education: Not on file   Highest education level: Not on file  Occupational History   Occupation: Nurse    Comment: [employer]  Tobacco Use   Smoking  status: Never   Smokeless tobacco: Never   Tobacco comments:    Never smoked  Vaping Use   Vaping Use: Never used  Substance and Sexual Activity   Alcohol use: Not Currently    Alcohol/week: 1.0 standard drink    Types: 1 Glasses of wine per week    Comment: occ wine   Drug use: No   Sexual activity: Yes    Birth control/protection: Surgical    Comment: hyst  Other Topics Concern   Not on file  Social History Narrative   Not on file   Social Determinants of Health   Financial Resource Strain: Low Risk    Difficulty of Paying Living Expenses: Not hard at all  Food Insecurity: No Food Insecurity   Worried About Programme researcher, broadcasting/film/video in the Last Year: Never true   Barista in the Last Year: Never true  Transportation Needs: No Transportation Needs   Lack of Transportation (Medical): No   Lack of Transportation (Non-Medical): No  Physical Activity: Insufficiently Active   Days of Exercise per Week: 1 day   Minutes of Exercise per Session: 30 min  Stress: Stress Concern Present   Feeling of Stress : To some extent  Social Connections: Socially Integrated   Frequency of Communication with Friends and Family: Three times a week   Frequency of Social Gatherings with Friends and  Family: Twice a week   Attends Religious Services: More than 4 times per year   Active Member of [employer] or Organizations: Yes   Attends Engineer, structural: More than 4 times per year   Marital Status: Married  Catering manager Violence: Not At Risk   Fear of Current or Ex-Partner: No   Emotionally Abused: No   Physically Abused: No   Sexually Abused: No     ROS- All systems are reviewed and negative except as per the HPI above.  Physical Exam: Vitals:   01/28/21 0904  BP: 114/74  Pulse: (!) 109  Weight: 89.1 kg  Height: 5' 6.25" (1.683 m)    GEN- The patient is a well appearing obese female, alert and oriented x 3 today.   HEENT-head normocephalic, atraumatic, sclera clear, conjunctiva pink, hearing intact, trachea midline. Lungs- Clear to ausculation bilaterally, normal work of breathing Heart- irregular rate and rhythm, no murmurs, rubs or gallops  GI- soft, NT, ND, + BS Extremities- no clubbing, cyanosis, or edema MS- no significant deformity or atrophy Skin- no rash or lesion Psych- euthymic mood, full affect Neuro- strength and sensation are intact   Wt Readings from Last 3 Encounters:  01/28/21 89.1 kg  01/05/21 93.4 kg  12/01/20 89.7 kg    EKG today demonstrates  Atrial flutter with predominantly 2:1 block Vent. rate 109 BPM PR interval * ms QRS duration 112 ms QT/QTcB 294/395 ms  Echo 01/06/21 demonstrated  1. Left ventricular ejection fraction, by estimation, is >75%. The left  ventricle has hyperdynamic function. The left ventricle has no regional  wall motion abnormalities. There is moderate left ventricular hypertrophy.  Left ventricular diastolic  parameters are consistent with Grade II diastolic dysfunction  (pseudonormalization).   2. Right ventricular systolic function is normal. The right ventricular  size is normal. There is normal pulmonary artery systolic pressure.   3. Left atrial size was mildly dilated.   4. The mitral valve  is normal in structure. Mild mitral valve  regurgitation. No evidence of mitral stenosis.   5. The aortic valve is calcified.  Aortic valve regurgitation is mild to  moderate. Mild aortic valve sclerosis is present, with no evidence of  aortic valve stenosis.   6. The inferior vena cava is normal in size with greater than 50%  respiratory variability, suggesting right atrial pressure of 3 mmHg.   Conclusion(s)/Recommendation(s): No intracardiac source of embolism  detected on this transthoracic study. A transesophageal echocardiogram is  recommended to exclude cardiac source of embolism if clinically indicated.   Epic records are reviewed at length today  CHA2DS2-VASc Score = 5  The patient's score is based upon: CHF History: 0 HTN History: 1 Diabetes History: 0 Stroke History: 2 Vascular Disease History: 0 Age Score: 1 Gender Score: 1          ASSESSMENT AND PLAN: 1. Persistent Atrial Fibrillation/atrial flutter The patient's CHA2DS2-VASc score is 5, indicating a 7.2% annual risk of stroke.   Patient in atrial flutter today with borderline rates. Continue Lopressor 12.5 mg PRN q 6 hours for heart racing. (She has side effects on full pill) Will plan for DCCV. Check bmet/cbc. Continue flecainide 100 mg BID for now. We discussed alternate rhythm control strategies including changing AAD (dofetilide) vs ablation. Patient would like to continue flecainide for now. She will d/w Dr Ladona Ridgel at her next visit.  Continue diltiazem 180 mg daily  Continue Eliquis 5 mg BID. ASA added for 6 months after recent CVA.  2. Secondary Hypercoagulable State (ICD10:  D68.69) The patient is at significant risk for stroke/thromboembolism based upon her CHA2DS2-VASc Score of 5.  Continue Apixaban (Eliquis).   3. Obesity Body mass index is 31.46 kg/m. Lifestyle modification was discussed and encouraged including regular physical activity and weight reduction.  4. HTN Stable, no changes  today.   Follow up in the AF clinic post DCCV.    Jorja Loa PA-C [facility] [address] [place], [place] [address] [phone] 01/28/2021 9:13 AM

## 2021-01-28 ENCOUNTER — Other Ambulatory Visit: Payer: Self-pay

## 2021-01-28 ENCOUNTER — Telehealth (HOSPITAL_COMMUNITY): Payer: Self-pay | Admitting: Pharmacist

## 2021-01-28 ENCOUNTER — Encounter (HOSPITAL_COMMUNITY): Payer: Self-pay | Admitting: Physician Assistant

## 2021-01-28 ENCOUNTER — Ambulatory Visit (HOSPITAL_COMMUNITY)
Admission: RE | Admit: 2021-01-28 | Discharge: 2021-01-28 | Disposition: A | Discharge status: Home / Self Care | Payer: Medicare Other | Source: Ambulatory Visit | Attending: Physician Assistant | Admitting: Physician Assistant

## 2021-01-28 VITALS — BP 114/74 | HR 109 | Ht 66.25 in | Wt 196.4 lb

## 2021-01-28 DIAGNOSIS — Z7901 Long term (current) use of anticoagulants: Secondary | ICD-10-CM | POA: Insufficient documentation

## 2021-01-28 DIAGNOSIS — I484 Atypical atrial flutter: Secondary | ICD-10-CM | POA: Diagnosis not present

## 2021-01-28 DIAGNOSIS — I4819 Other persistent atrial fibrillation: Secondary | ICD-10-CM | POA: Insufficient documentation

## 2021-01-28 DIAGNOSIS — Z8249 Family history of ischemic heart disease and other diseases of the circulatory system: Secondary | ICD-10-CM | POA: Insufficient documentation

## 2021-01-28 DIAGNOSIS — Z6831 Body mass index (BMI) 31.0-31.9, adult: Secondary | ICD-10-CM | POA: Insufficient documentation

## 2021-01-28 DIAGNOSIS — I4892 Unspecified atrial flutter: Secondary | ICD-10-CM | POA: Diagnosis not present

## 2021-01-28 DIAGNOSIS — I10 Essential (primary) hypertension: Secondary | ICD-10-CM | POA: Insufficient documentation

## 2021-01-28 DIAGNOSIS — D6869 Other thrombophilia: Secondary | ICD-10-CM | POA: Diagnosis not present

## 2021-01-28 DIAGNOSIS — E669 Obesity, unspecified: Secondary | ICD-10-CM | POA: Diagnosis not present

## 2021-01-28 LAB — BASIC METABOLIC PANEL
Anion gap: 6 (ref 5–15)
BUN: 14 mg/dL (ref 8–23)
CO2: 25 mmol/L (ref 22–32)
Calcium: 9.6 mg/dL (ref 8.9–10.3)
Chloride: 108 mmol/L (ref 98–111)
Creatinine, Ser: 0.98 mg/dL (ref 0.44–1.00)
GFR, Estimated: >60 mL/min (ref >60)
Glucose, Bld: 100 mg/dL — ABNORMAL HIGH (ref 70–99)
Potassium: 4.4 mmol/L (ref 3.5–5.1)
Sodium: 139 mmol/L (ref 135–145)

## 2021-01-28 LAB — CBC
HCT: 45 % (ref 36.0–46.0)
Hemoglobin: 14.6 g/dL (ref 12.0–15.0)
MCH: 30.3 pg (ref 26.0–34.0)
MCHC: 32.4 g/dL (ref 30.0–36.0)
MCV: 93.4 fL (ref 80.0–100.0)
Platelets: 254 10*3/uL (ref 150–400)
RBC: 4.82 MIL/uL (ref 3.87–5.11)
RDW: 12.9 % (ref 11.5–15.5)
WBC: 8.7 10*3/uL (ref 4.0–10.5)
nRBC: 0 % (ref 0.0–0.2)

## 2021-01-28 NOTE — Patient Instructions (Signed)
Cardioversion scheduled for Monday, October 31st  - Arrive at the [facility] and go to admitting at [facility] not eat or drink anything after midnight the night prior to your procedure.  - Take all your morning medication (except diabetic medications) with a sip of water prior to arrival.  - You will not be able to drive home after your procedure.  - Do NOT miss any doses of your blood thinner - if you should miss a dose please notify our office immediately.  - If you feel as if you go back into normal rhythm prior to scheduled cardioversion, please notify our office immediately. If your procedure is canceled in the cardioversion suite you will be charged a cancellation fee. Patients will be asked to: to mask in public and hand hygiene (no longer quarantine) in the 3 days prior to surgery, to report if any COVID-19-like illness or household contacts to COVID-19 to determine need for testing

## 2021-01-28 NOTE — Telephone Encounter (Signed)
Pt doing well.

## 2021-01-29 ENCOUNTER — Encounter (HOSPITAL_COMMUNITY): Payer: Self-pay | Admitting: Cardiovascular Disease

## 2021-02-08 NOTE — Anesthesia Preprocedure Evaluation (Addendum)
Anesthesia Evaluation  Patient identified by MRN, date of birth, ID band Patient awake    Reviewed: Allergy & Precautions, NPO status , Patient's Chart, lab work & pertinent test results  History of Anesthesia Complications Negative for: history of anesthetic complications  Airway Mallampati: II  TM Distance: >3 FB Neck ROM: Full    Dental  (+) Teeth Intact   Pulmonary neg pulmonary ROS,    Pulmonary exam normal        Cardiovascular hypertension, + dysrhythmias Atrial Fibrillation  Rhythm:Regular Rate:Tachycardia   Echo 01/06/21: EF>75%, no RWMA, mod LVH, g2dd, normal RV fn, mild LAE, mild MR, mild-mod AI, no AS   Neuro/Psych CVA (01/05/21, s/p thrombectomy)    GI/Hepatic negative GI ROS, Neg liver ROS,   Endo/Other  diabetes, Well Controlled, Type 2  Renal/GU negative Renal ROS  negative genitourinary   Musculoskeletal negative musculoskeletal ROS (+)   Abdominal   Peds  Hematology negative hematology ROS (+)   Anesthesia Other Findings   Reproductive/Obstetrics                            Anesthesia Physical Anesthesia Plan  ASA: 4  Anesthesia Plan: General   Post-op Pain Management:    Induction: Intravenous  PONV Risk Score and Plan: TIVA and Treatment may vary due to age or medical condition  Airway Management Planned: Mask  Additional Equipment: None  Intra-op Plan:   Post-operative Plan:   Informed Consent: I have reviewed the patients History and Physical, chart, labs and discussed the procedure including the risks, benefits and alternatives for the proposed anesthesia with the patient or authorized representative who has indicated his/her understanding and acceptance.       Plan Discussed with:   Anesthesia Plan Comments:        Anesthesia Quick Evaluation

## 2021-02-09 ENCOUNTER — Encounter (HOSPITAL_COMMUNITY)
Admission: RE | Disposition: A | Discharge status: Home / Self Care | Payer: Self-pay | Source: Home / Self Care | Attending: Cardiovascular Disease

## 2021-02-09 ENCOUNTER — Other Ambulatory Visit: Payer: Self-pay

## 2021-02-09 ENCOUNTER — Ambulatory Visit (HOSPITAL_COMMUNITY): Payer: Medicare Other | Admitting: Anesthesiology

## 2021-02-09 ENCOUNTER — Ambulatory Visit (HOSPITAL_COMMUNITY)
Admission: RE | Admit: 2021-02-09 | Discharge: 2021-02-09 | Disposition: A | Discharge status: Home / Self Care | Payer: Medicare Other | Attending: Cardiovascular Disease | Admitting: Cardiovascular Disease

## 2021-02-09 ENCOUNTER — Encounter (HOSPITAL_COMMUNITY): Payer: Self-pay | Admitting: Cardiovascular Disease

## 2021-02-09 DIAGNOSIS — Z7901 Long term (current) use of anticoagulants: Secondary | ICD-10-CM | POA: Insufficient documentation

## 2021-02-09 DIAGNOSIS — I10 Essential (primary) hypertension: Secondary | ICD-10-CM | POA: Insufficient documentation

## 2021-02-09 DIAGNOSIS — Z6831 Body mass index (BMI) 31.0-31.9, adult: Secondary | ICD-10-CM | POA: Insufficient documentation

## 2021-02-09 DIAGNOSIS — Z7982 Long term (current) use of aspirin: Secondary | ICD-10-CM | POA: Diagnosis not present

## 2021-02-09 DIAGNOSIS — I4892 Unspecified atrial flutter: Secondary | ICD-10-CM | POA: Insufficient documentation

## 2021-02-09 DIAGNOSIS — Z79890 Hormone replacement therapy (postmenopausal): Secondary | ICD-10-CM | POA: Diagnosis not present

## 2021-02-09 DIAGNOSIS — I4819 Other persistent atrial fibrillation: Secondary | ICD-10-CM | POA: Diagnosis present

## 2021-02-09 DIAGNOSIS — E669 Obesity, unspecified: Secondary | ICD-10-CM | POA: Diagnosis not present

## 2021-02-09 DIAGNOSIS — Z79899 Other long term (current) drug therapy: Secondary | ICD-10-CM | POA: Insufficient documentation

## 2021-02-09 DIAGNOSIS — D6869 Other thrombophilia: Secondary | ICD-10-CM | POA: Insufficient documentation

## 2021-02-09 HISTORY — PX: CARDIOVERSION: SHX1299

## 2021-02-09 SURGERY — CARDIOVERSION
Anesthesia: General

## 2021-02-09 MED ORDER — PROPOFOL 10 MG/ML IV BOLUS
INTRAVENOUS | Status: DC | PRN
  Administered 2021-02-09: 50 mg via INTRAVENOUS

## 2021-02-09 MED ORDER — SODIUM CHLORIDE 0.9 % IV SOLN
INTRAVENOUS | Status: AC | PRN
  Administered 2021-02-09: 500 mL via INTRAVENOUS

## 2021-02-09 MED ORDER — LIDOCAINE 2% (20 MG/ML) 5 ML SYRINGE
INTRAMUSCULAR | Status: DC | PRN
  Administered 2021-02-09: 60 mg via INTRAVENOUS

## 2021-02-09 NOTE — Progress Notes (Signed)
Removed IV from L arm, catheter intact.

## 2021-02-09 NOTE — CV Procedure (Signed)
DCC: Anesthesia: Propofol  DCC x 2 150 then 200 J biphasic  Converted from flutter rate 108 bpm to NSR rate 58 bpm after a long pause And some junctional rhythm  No immediate neurologic sequelae On Rx eliquis with no missed doses  Charlton Haws MD [facility]

## 2021-02-09 NOTE — Discharge Instructions (Signed)

## 2021-02-09 NOTE — Anesthesia Postprocedure Evaluation (Addendum)
Anesthesia Post Note  Patient: Renee Benitez  Procedure(s) Performed: CARDIOVERSION     Patient location during evaluation: Endoscopy Anesthesia Type: General Level of consciousness: awake and alert Pain management: pain level controlled Vital Signs Assessment: post-procedure vital signs reviewed and stable Respiratory status: spontaneous breathing, nonlabored ventilation and respiratory function stable Cardiovascular status: blood pressure returned to baseline and stable Postop Assessment: no apparent nausea or vomiting Anesthetic complications: no   No notable events documented.  Last Vitals:  Vitals:   02/09/21 0920 02/09/21 0934  BP: 132/67 130/60  Pulse: (!) 59 61  Resp: 11 12  Temp:    SpO2: 99% 99%    Last Pain:  Vitals:   02/09/21 0920  TempSrc:   PainSc: 0-No pain                 Lucretia Kern

## 2021-02-09 NOTE — Transfer of Care (Signed)
Immediate Anesthesia Transfer of Care Note  Patient: Renee Benitez  Procedure(s) Performed: CARDIOVERSION  Patient Location: Endoscopy Unit  Anesthesia Type:General  Level of Consciousness: awake, alert  and oriented  Airway & Oxygen Therapy: Patient Spontanous Breathing  Post-op Assessment: Report given to RN and Post -op Vital signs reviewed and stable  Post vital signs: Reviewed and stable  Last Vitals:  Vitals Value Taken Time  BP 133/70   Temp    Pulse 56   Resp 15   SpO2 100     Last Pain:  Vitals:   02/09/21 0824  TempSrc: Oral  PainSc: 0-No pain         Complications: No notable events documented.

## 2021-02-09 NOTE — Interval H&P Note (Signed)
History and Physical Interval Note:  02/09/2021 8:23 AM  Mora Appl  has presented today for surgery, with the diagnosis of A-FIB.  The various methods of treatment have been discussed with the patient and family. After consideration of risks, benefits and other options for treatment, the patient has consented to  Procedure(s): CARDIOVERSION (N/A) as a surgical intervention.  The patient's history has been reviewed, patient examined, no change in status, stable for surgery.  I have reviewed the patient's chart and labs.  Questions were answered to the patient's satisfaction.     Charlton Haws

## 2021-02-09 NOTE — Anesthesia Procedure Notes (Signed)
Procedure Name: General with mask airway Date/Time: 02/09/2021 8:51 AM Performed by: Lelon Perla, CRNA Pre-anesthesia Checklist: Timeout performed, Patient being monitored, Suction available, Emergency Drugs available and Patient identified Patient Re-evaluated:Patient Re-evaluated prior to induction Oxygen Delivery Method: Non-rebreather mask Preoxygenation: Pre-oxygenation with 100% oxygen Induction Type: IV induction Placement Confirmation: positive ETCO2 and CO2 detector Dental Injury: Teeth and Oropharynx as per pre-operative assessment

## 2021-02-11 ENCOUNTER — Encounter (HOSPITAL_COMMUNITY): Payer: Self-pay | Admitting: Cardiovascular Disease

## 2021-02-11 NOTE — Progress Notes (Signed)
[facility] [address], [place] [address]   CLINIC:  Medical Oncology/Hematology  Patient Care Team: Caren Macadam, MD as PCP - General (Family Medicine) Evans Lance, MD as PCP - Electrophysiology (Cardiology) Jonnie Kind, MD (Inactive) (Obstetrics and Gynecology) Gala Romney Cristopher Estimable, MD (Gastroenterology) Satira Sark, MD (Cardiology)  CHIEF COMPLAINTS/PURPOSE OF CONSULTATION:  Evaluation for hypercoagulable state.  HISTORY OF PRESENTING ILLNESS:  Renee Benitez 70 y.o. female is here because of evaluation of thromboembolism, at the request of Dr. Mannie Stabile .  Today she reports feeling good. She denies history of PE and DVT. She is currently taking 5 mg Eliquis BID since 2018 due to history of Afib. She reports one episode of CVA on 09/26 following taking Eliquis 3 hours late. At the time of CVA she had left side weakness which had since resolved. She denies any previous history of CVA. She denies history of DM, lupus, RA, and miscarriages. She has had 2 normal pregnancies. She reports occasional forgetfulness when speaking which has been present for several years prior to the CVA. She denies skin rash and current bleeding. 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 She currently lives at home with her husband. She denies smoking history. She reports her sister had 1 miscarriage. She denies family history of lupus. Her sister had colon cancer at age 44, her father had bladder cancer, and multiple maternal aunts had breast cancer.    MEDICAL HISTORY:  Past Medical History:  Diagnosis Date   Atrial fibrillation ([place])    Current use of estrogen therapy 01/31/2013   Dysrhythmia    AFib   Hypertension    Macular degeneration    Menopausal syndrome    Right ovarian cyst 01/25/2014    SURGICAL HISTORY: Past Surgical History:  Procedure Laterality Date   ABDOMINAL HYSTERECTOMY     APPENDECTOMY     CARDIOVERSION N/A 09/14/2016   Procedure: CARDIOVERSION;   Surgeon: Satira Sark, MD;  Location: [facility] ORS;  Service: Cardiovascular;  Laterality: N/A;   CARDIOVERSION N/A 02/09/2021   Procedure: CARDIOVERSION;  Surgeon: Josue Hector, MD;  Location: [facility] ENDOSCOPY;  Service: Cardiovascular;  Laterality: N/A;   CATARACT EXTRACTION W/ INTRAOCULAR LENS  IMPLANT, BILATERAL Bilateral    COLONOSCOPY  12/04/03   Friable anal canal otherwise normal rectum and colon   COLONOSCOPY N/A 03/19/2013   Dr. Gala Romney: diverticulosis, hemorrhoids. next TCS 10 years   COLONOSCOPY N/A 05/02/2019   Procedure: COLONOSCOPY;  Surgeon: Daneil Dolin, MD;  Location: [facility] ENDO SUITE;  Service: Endoscopy;  Laterality: N/A;  7:30am   ESOPHAGOGASTRODUODENOSCOPY N/A 10/17/2018   Procedure: ESOPHAGOGASTRODUODENOSCOPY (EGD);  Surgeon: Daneil Dolin, MD;  Location: [facility] ENDO SUITE;  Service: Endoscopy;  Laterality: N/A;  3:00pm   IR CT HEAD LTD  01/05/2021   IR CT HEAD LTD  01/05/2021   IR INTRA CRAN STENT  01/05/2021   IR PERCUTANEOUS ART THROMBECTOMY/INFUSION INTRACRANIAL INC DIAG ANGIO  01/05/2021   IR US GUIDE VASC ACCESS LEFT  01/05/2021   MALONEY DILATION N/A 10/17/2018   Procedure: Venia Minks DILATION;  Surgeon: Daneil Dolin, MD;  Location: [facility] ENDO SUITE;  Service: Endoscopy;  Laterality: N/A;   RADIOLOGY WITH ANESTHESIA N/A 01/05/2021   Procedure: IR WITH ANESTHESIA;  Surgeon: Radiologist, Medication, MD;  Location: [facility];  Service: Radiology;  Laterality: N/A;   TEE WITHOUT CARDIOVERSION N/A 09/14/2016   Procedure: TRANSESOPHAGEAL ECHOCARDIOGRAM (TEE) WITH PROPOFOL;  Surgeon: Satira Sark, MD;  Location: [facility]  ORS;  Service: Cardiovascular;  Laterality: N/A;   TONSILECTOMY, ADENOIDECTOMY, BILATERAL MYRINGOTOMY AND TUBES      SOCIAL HISTORY: Social History   Socioeconomic History   Marital status: Married    Spouse name: Not on file   Number of children: Not on file   Years of education: Not on file   Highest education level: Not on file  Occupational History   Occupation:  Nurse    Comment: [employer]  Tobacco Use   Smoking status: Never   Smokeless tobacco: Never   Tobacco comments:    Never smoked  Vaping Use   Vaping Use: Never used  Substance and Sexual Activity   Alcohol use: Not Currently    Alcohol/week: 1.0 standard drink    Types: 1 Glasses of wine per week    Comment: occ wine   Drug use: No   Sexual activity: Yes    Birth control/protection: Surgical    Comment: hyst  Other Topics Concern   Not on file  Social History Narrative   Not on file   Social Determinants of Health   Financial Resource Strain: Low Risk    Difficulty of Paying Living Expenses: Not hard at all  Food Insecurity: No Food Insecurity   Worried About Charity fundraiser in the Last Year: Never true   Ran Out of Food in the Last Year: Never true  Transportation Needs: No Transportation Needs   Lack of Transportation (Medical): No   Lack of Transportation (Non-Medical): No  Physical Activity: Insufficiently Active   Days of Exercise per Week: 1 day   Minutes of Exercise per Session: 30 min  Stress: Stress Concern Present   Feeling of Stress : To some extent  Social Connections: Socially Integrated   Frequency of Communication with Friends and Family: Three times a week   Frequency of Social Gatherings with Friends and Family: Twice a week   Attends Religious Services: More than 4 times per year   Active Member of [employer] or Organizations: Yes   Attends Music therapist: More than 4 times per year   Marital Status: Married  Human resources officer Violence: Not At Risk   Fear of Current or Ex-Partner: No   Emotionally Abused: No   Physically Abused: No   Sexually Abused: No    FAMILY HISTORY: Family History  Problem Relation Age of Onset   Coronary artery disease Father    Cancer Father        bladder cancer   Arrhythmia Sister        Reportedly had ablation of SVT   Hypertension Sister    Colon cancer Sister 39       s/p surgical  resection, no chemo/radiation   Colon cancer Maternal Grandmother    Hypertension Son     ALLERGIES:  has No Known Allergies.  MEDICATIONS:  Current Outpatient Medications  Medication Sig Dispense Refill   acetaminophen (TYLENOL) 500 MG tablet Take 1,000 mg by mouth every 6 (six) hours as needed for moderate pain or headache.     aspirin 81 MG EC tablet Take 1 tablet (81 mg total) by mouth daily. 150 tablet 2   Calcium Carb-Cholecalciferol (CALCIUM 600 + D PO) Take 1 tablet by mouth every evening.     Cholecalciferol (DIALYVITE VITAMIN D 5000) 125 MCG (5000 UT) capsule Take 5,000 Units by mouth every evening.     diltiazem (CARDIZEM) 60 MG tablet Take 60 mg by mouth daily as needed (afib).  diltiazem (CARTIA XT) 180 MG 24 hr capsule Take 1 capsule (180 mg total) by mouth daily. 180 capsule 3   ELIQUIS 5 MG TABS tablet TAKE ONE TABLET (5MG  TOTAL) BY MOUTH TWOTIMES DAILY 180 tablet 3   escitalopram (LEXAPRO) 5 MG tablet Take 5 mg by mouth in the morning.     flecainide (TAMBOCOR) 100 MG tablet Take 1 tablet (100 mg total) by mouth 2 (two) times daily. 180 tablet 3   levothyroxine (SYNTHROID) 50 MCG tablet Take 50 mcg by mouth daily at 2 am.     losartan (COZAAR) 25 MG tablet Take 25 mg by mouth every evening.     magnesium oxide (MAG-OX) 400 MG tablet Take 400 mg by mouth at bedtime.     metoprolol tartrate (LOPRESSOR) 25 MG tablet Take 1 tablet by mouth every 8 hours as needed for breakthrough afib (Patient taking differently: Take 12.5 mg by mouth every 8 (eight) hours as needed (breakthrough afib).) 60 tablet 3   Multiple Vitamins-Minerals (MULTIVITAMIN WITH MINERALS) tablet Take 1 tablet by mouth in the morning. Women's One A Day     Multiple Vitamins-Minerals (PRESERVISION AREDS 2) CAPS Take 1 capsule by mouth 2 (two) times daily.     Omega-3 Fatty Acids (FISH OIL ULTRA) 1400 MG CAPS Take 1,400 mg by mouth in the morning.     pantoprazole (PROTONIX) 40 MG tablet Take 1 tablet (40 mg  total) by mouth daily before breakfast. 90 tablet 0   rosuvastatin (CRESTOR) 20 MG tablet Take 20 mg by mouth in the morning.     No current facility-administered medications for this visit.    REVIEW OF SYSTEMS:   Review of Systems  Constitutional:  Negative for appetite change and fatigue.  HENT:   Negative for nosebleeds.   Respiratory:  Negative for hemoptysis.   Cardiovascular:  Positive for palpitations (Afib).  Gastrointestinal:  Negative for blood in stool.  Genitourinary:  Negative for hematuria and vaginal bleeding.   Skin:  Negative for rash.  Neurological:  Negative for extremity weakness (resolved).  Hematological:  Does not bruise/bleed easily.  Psychiatric/Behavioral:         Forgetful  All other systems reviewed and are negative.   PHYSICAL EXAMINATION: ECOG PERFORMANCE STATUS: 0 - Asymptomatic  Vitals:   02/13/21 0848  BP: 129/81  Pulse: (!) 112  Resp: 18  Temp: (!) 97.4 F (36.3 C)   Filed Weights   02/13/21 0848  Weight: 192 lb (87.1 kg)   Physical Exam Vitals reviewed.  Constitutional:      Appearance: Normal appearance.  Cardiovascular:     Rate and Rhythm: Normal rate and regular rhythm.     Pulses: Normal pulses.     Heart sounds: Normal heart sounds.  Pulmonary:     Effort: Pulmonary effort is normal.     Breath sounds: Normal breath sounds.  Abdominal:     Palpations: Abdomen is soft. There is no hepatomegaly, splenomegaly or mass.     Tenderness: There is no abdominal tenderness.  Musculoskeletal:     Right lower leg: No edema.     Left lower leg: No edema.  Lymphadenopathy:     Upper Body:     Right upper body: No supraclavicular adenopathy.     Left upper body: No supraclavicular adenopathy.  Neurological:     General: No focal deficit present.     Mental Status: She is alert and oriented to person, place, and time.  Psychiatric:  Mood and Affect: Mood normal.        Behavior: Behavior normal.     LABORATORY DATA:   I have reviewed the data as listed Recent Results (from the past 2160 hour(s))  Protime-INR     Status: None   Collection Time: 01/05/21 12:06 PM  Result Value Ref Range   Prothrombin Time 15.0 11.4 - 15.2 seconds   INR 1.2 0.8 - 1.2    Comment: (NOTE) INR goal varies based on device and disease states. Performed at [facility], [address], [place], [place] [address]   APTT     Status: None   Collection Time: 01/05/21 12:06 PM  Result Value Ref Range   aPTT 29 24 - 36 seconds    Comment: Performed at [facility], [address] [place], [place] [address]  CBC     Status: None   Collection Time: 01/05/21 12:06 PM  Result Value Ref Range   WBC 6.1 4.0 - 10.5 K/uL   RBC 4.53 3.87 - 5.11 MIL/uL   Hemoglobin 14.1 12.0 - 15.0 g/dL   HCT 42.0 36.0 - 46.0 %   MCV 92.7 80.0 - 100.0 fL   MCH 31.1 26.0 - 34.0 pg   MCHC 33.6 30.0 - 36.0 g/dL   RDW 12.8 11.5 - 15.5 %   Platelets 226 150 - 400 K/uL   nRBC 0.0 0.0 - 0.2 %    Comment: Performed at [facility], [address], [place], [place] [address]  Differential     Status: None   Collection Time: 01/05/21 12:06 PM  Result Value Ref Range   Neutrophils Relative % 46 %   Neutro Abs 2.8 1.7 - 7.7 K/uL   Lymphocytes Relative 43 %   Lymphs Abs 2.7 0.7 - 4.0 K/uL   Monocytes Relative 7 %   Monocytes Absolute 0.5 0.1 - 1.0 K/uL   Eosinophils Relative 2 %   Eosinophils Absolute 0.1 0.0 - 0.5 K/uL   Basophils Relative 2 %   Basophils Absolute 0.1 0.0 - 0.1 K/uL   Immature Granulocytes 0 %   Abs Immature Granulocytes 0.02 0.00 - 0.07 K/uL    Comment: Performed at [facility], [address], [place], [place] [address]  Comprehensive metabolic panel     Status: None   Collection Time: 01/05/21 12:06 PM  Result Value Ref Range   Sodium 137 135 - 145 mmol/L   Potassium 4.1 3.5 - 5.1 mmol/L   Chloride 105 98 - 111 mmol/L   CO2 25 22 - 32 mmol/L   Glucose, Bld 89 70 - 99 mg/dL    Comment: Glucose reference range applies only to  samples taken after fasting for at least 8 hours.   BUN 17 8 - 23 mg/dL   Creatinine, Ser 0.85 0.44 - 1.00 mg/dL   Calcium 9.2 8.9 - 10.3 mg/dL   Total Protein 6.9 6.5 - 8.1 g/dL   Albumin 3.9 3.5 - 5.0 g/dL   AST 29 15 - 41 U/L   ALT 34 0 - 44 U/L   Alkaline Phosphatase 73 38 - 126 U/L   Total Bilirubin 0.6 0.3 - 1.2 mg/dL   GFR, Estimated >60 >60 mL/min    Comment: (NOTE) Calculated using the CKD-EPI Creatinine Equation (2021)    Anion gap 7 5 - 15    Comment: Performed at [facility], [address] [place], [place] [address]  CBG monitoring, ED     Status: None   Collection Time:  01/05/21 12:08 PM  Result Value Ref Range   Glucose-Capillary 83 70 - 99 mg/dL    Comment: Glucose reference range applies only to samples taken after fasting for at least 8 hours.  I-stat chem 8, ED     Status: None   Collection Time: 01/05/21 12:22 PM  Result Value Ref Range   Sodium 142 135 - 145 mmol/L   Potassium 4.4 3.5 - 5.1 mmol/L   Chloride 106 98 - 111 mmol/L   BUN 16 8 - 23 mg/dL   Creatinine, Ser 0.90 0.44 - 1.00 mg/dL   Glucose, Bld 92 70 - 99 mg/dL    Comment: Glucose reference range applies only to samples taken after fasting for at least 8 hours.   Calcium, Ion 1.20 1.15 - 1.40 mmol/L   TCO2 27 22 - 32 mmol/L   Hemoglobin 13.9 12.0 - 15.0 g/dL   HCT 41.0 36.0 - 46.0 %  Resp Panel by RT-PCR (Flu A&B, Covid) Nasopharyngeal Swab     Status: None   Collection Time: 01/05/21  1:12 PM   Specimen: Nasopharyngeal Swab; Nasopharyngeal(NP) swabs in vial transport medium  Result Value Ref Range   SARS Coronavirus 2 by RT PCR NEGATIVE NEGATIVE    Comment: (NOTE) SARS-CoV-2 target nucleic acids are NOT DETECTED.  The SARS-CoV-2 RNA is generally detectable in upper respiratory specimens during the acute phase of infection. The lowest concentration of SARS-CoV-2 viral copies this assay can detect is 138 copies/mL. A negative result does not preclude SARS-Cov-2 infection and should not be  used as the sole basis for treatment or other patient management decisions. A negative result may occur with  improper specimen collection/handling, submission of specimen other than nasopharyngeal swab, presence of viral mutation(s) within the areas targeted by this assay, and inadequate number of viral copies(<138 copies/mL). A negative result must be combined with clinical observations, patient history, and epidemiological information. The expected result is Negative.  Fact Sheet for Patients:  EntrepreneurPulse.com.au  Fact Sheet for Healthcare Providers:  IncredibleEmployment.be  This test is no t yet approved or cleared by the [place] FDA and  has been authorized for detection and/or diagnosis of SARS-CoV-2 by FDA under an Emergency Use Authorization (EUA). This EUA will remain  in effect (meaning this test can be used) for the duration of the COVID-19 declaration under Section 564(b)(1) of the Act, 21 U.S.C.section 360bbb-3(b)(1), unless the authorization is terminated  or revoked sooner.       Influenza A by PCR NEGATIVE NEGATIVE   Influenza B by PCR NEGATIVE NEGATIVE    Comment: (NOTE) The Xpert Xpress SARS-CoV-2/FLU/RSV plus assay is intended as an aid in the diagnosis of influenza from Nasopharyngeal swab specimens and should not be used as a sole basis for treatment. Nasal washings and aspirates are unacceptable for Xpert Xpress SARS-CoV-2/FLU/RSV testing.  Fact Sheet for Patients: EntrepreneurPulse.com.au  Fact Sheet for Healthcare Providers: IncredibleEmployment.be  This test is not yet approved or cleared by the [place] FDA and has been authorized for detection and/or diagnosis of SARS-CoV-2 by FDA under an Emergency Use Authorization (EUA). This EUA will remain in effect (meaning this test can be used) for the duration of the COVID-19 declaration under Section 564(b)(1) of the  Act, 21 U.S.C. section 360bbb-3(b)(1), unless the authorization is terminated or revoked.  Performed at [facility], [address] [place], [place] [address]   Glucose, capillary     Status: Abnormal   Collection Time: 01/05/21  6:32 PM  Result  Value Ref Range   Glucose-Capillary 134 (H) 70 - 99 mg/dL    Comment: Glucose reference range applies only to samples taken after fasting for at least 8 hours.  MRSA Next Gen by PCR, Nasal     Status: None   Collection Time: 01/05/21  6:50 PM   Specimen: Nasal Mucosa; Nasal Swab  Result Value Ref Range   MRSA by PCR Next Gen NOT DETECTED NOT DETECTED    Comment: (NOTE) The GeneXpert MRSA Assay (FDA approved for NASAL specimens only), is one component of a comprehensive MRSA colonization surveillance program. It is not intended to diagnose MRSA infection nor to guide or monitor treatment for MRSA infections. Test performance is not FDA approved in patients less than 27 years old. Performed at [facility], [place] [address], [place], [place] [address]   Glucose, capillary     Status: Abnormal   Collection Time: 01/05/21  7:41 PM  Result Value Ref Range   Glucose-Capillary 129 (H) 70 - 99 mg/dL    Comment: Glucose reference range applies only to samples taken after fasting for at least 8 hours.  Glucose, capillary     Status: Abnormal   Collection Time: 01/06/21 12:00 AM  Result Value Ref Range   Glucose-Capillary 122 (H) 70 - 99 mg/dL    Comment: Glucose reference range applies only to samples taken after fasting for at least 8 hours.  Glucose, capillary     Status: Abnormal   Collection Time: 01/06/21  3:40 AM  Result Value Ref Range   Glucose-Capillary 129 (H) 70 - 99 mg/dL    Comment: Glucose reference range applies only to samples taken after fasting for at least 8 hours.  Hemoglobin A1c     Status: None   Collection Time: 01/06/21  4:33 AM  Result Value Ref Range   Hgb A1c MFr Bld 5.5 4.8 - 5.6 %    Comment: (NOTE) Pre  diabetes:          5.7%-6.4%  Diabetes:              >6.4%  Glycemic control for   <7.0% adults with diabetes    Mean Plasma Glucose 111.15 mg/dL    Comment: Performed at [facility] [address], [place], [place] [address]  Lipid panel     Status: Abnormal   Collection Time: 01/06/21  4:33 AM  Result Value Ref Range   Cholesterol 196 0 - 200 mg/dL   Triglycerides 60 <150 mg/dL   HDL 53 >40 mg/dL   Total CHOL/HDL Ratio 3.7 RATIO   VLDL 12 0 - 40 mg/dL   LDL Cholesterol 131 (H) 0 - 99 mg/dL    Comment:        Total Cholesterol/HDL:CHD Risk Coronary Heart Disease Risk Table                     Men   Women  1/2 Average Risk   3.4   3.3  Average Risk       5.0   4.4  2 X Average Risk   9.6   7.1  3 X Average Risk  23.4   11.0        Use the calculated Patient Ratio above and the CHD Risk Table to determine the patient's CHD Risk.        ATP III CLASSIFICATION (LDL):  <100     mg/dL   Optimal  100-129  mg/dL  Near or Above                    Optimal  130-159  mg/dL   Borderline  945-859  mg/dL   High  >292     mg/dL   Very High Performed at [facility], [address] [place], [place] [address]   HIV Antibody (routine testing w rflx)     Status: None   Collection Time: 01/06/21  4:33 AM  Result Value Ref Range   HIV Screen 4th Generation wRfx Non Reactive Non Reactive    Comment: Performed at [facility], [address], [place], [place] [address]  Glucose, capillary     Status: None   Collection Time: 01/06/21  7:45 AM  Result Value Ref Range   Glucose-Capillary 87 70 - 99 mg/dL    Comment: Glucose reference range applies only to samples taken after fasting for at least 8 hours.  ECHOCARDIOGRAM COMPLETE     Status: None   Collection Time: 01/06/21 11:18 AM  Result Value Ref Range   Weight 3,296 oz   BP 142/66 mmHg   S' Lateral 2.30 cm   AR max vel 3.36 cm2   AV Area VTI 2.95 cm2   AV Mean grad 3.0 mmHg   AV Peak grad 5.1 mmHg   Ao pk  vel 1.13 m/s   P 1/2 time 373 msec   Area-P 1/2 2.48 cm2   AV Area mean vel 3.12 cm2   MV VTI 2.05 cm2  CBC     Status: None   Collection Time: 01/28/21  9:38 AM  Result Value Ref Range   WBC 8.7 4.0 - 10.5 K/uL   RBC 4.82 3.87 - 5.11 MIL/uL   Hemoglobin 14.6 12.0 - 15.0 g/dL   HCT 71.1 65.7 - 90.3 %   MCV 93.4 80.0 - 100.0 fL   MCH 30.3 26.0 - 34.0 pg   MCHC 32.4 30.0 - 36.0 g/dL   RDW 83.3 38.3 - 29.1 %   Platelets 254 150 - 400 K/uL   nRBC 0.0 0.0 - 0.2 %    Comment: Performed at [facility], [address] [place], [place] [address]  Basic metabolic panel     Status: Abnormal   Collection Time: 01/28/21  9:38 AM  Result Value Ref Range   Sodium 139 135 - 145 mmol/L   Potassium 4.4 3.5 - 5.1 mmol/L   Chloride 108 98 - 111 mmol/L   CO2 25 22 - 32 mmol/L   Glucose, Bld 100 (H) 70 - 99 mg/dL    Comment: Glucose reference range applies only to samples taken after fasting for at least 8 hours.   BUN 14 8 - 23 mg/dL   Creatinine, Ser 6.00 0.44 - 1.00 mg/dL   Calcium 9.6 8.9 - 45.9 mg/dL   GFR, Estimated >97 >74 mL/min    Comment: (NOTE) Calculated using the CKD-EPI Creatinine Equation (2021)    Anion gap 6 5 - 15    Comment: Performed at [facility], [address] [place], [place] [address]  D-dimer, quantitative     Status: None   Collection Time: 02/13/21  9:44 AM  Result Value Ref Range   D-Dimer, Quant <0.27 0.00 - 0.50 ug/mL-FEU    Comment: (NOTE) At the manufacturer cut-off value of 0.5 g/mL FEU, this assay has a negative predictive value of 95-100%.This assay is intended for use in conjunction with a  clinical pretest probability (PTP) assessment model to exclude pulmonary embolism (PE) and deep venous thrombosis (DVT) in outpatients suspected of PE or DVT. Results should be correlated with clinical presentation. Performed at [facility], [address] [place], [place] [address]     RADIOGRAPHIC STUDIES: I have personally reviewed the  radiological images as listed and agreed with the findings in the report. No results found.  ASSESSMENT:  Hypercoagulable state: - She has been on Eliquis since 2018 for atrial fibrillation.  She underwent cardioversion in 2018 and on 02/09/2021. - She presented with left-sided weakness on 01/05/2021.  She apparently delayed taking Eliquis for about 3 hours on that particular day.  She never missed any doses of Eliquis. - MRI brain on 01/05/2021 showed multiple small foci of acute/early subacute ischemia within both hemispheres with largest lesion in the right basal ganglia and thalamus.  Old left cerebellar infarct and mild findings of chronic small vessel disease. - She underwent successful mechanical thrombectomy for right PCA occlusion followed by stenting and angioplasty for treatment of a right P1/PCA occlusion with underlying proximal P2 segment stenosis.  She was started to have right BG and thalamus acute infarcts embolic secondary to atrial fibrillation. - She denies any bleeding issues while on Eliquis.  Her left-sided weakness has completely recovered. - No prior history of DVT or pulmonary embolism.  No prior history of miscarriages.  She had 2 normal pregnancies.  Social/family history: - She lives at home with her husband.  She is a retired Therapist, sports and worked in Designer, television/film set.  She is a non-smoker. - Sister had 1 miscarriage.  No family history of lupus anticoagulant.  Sister had colon cancer at age 19.  Father had bladder cancer at age 74.  Several maternal aunts had breast cancer.  PLAN:  Embolic stroke while on Eliquis: - It is quite uncommon to have a embolic stroke due to delay of taking Eliquis by 3 hours.  Eliquis half-life is approximately 12 hours, with range of 8 to 15 hours. - She is currently on aspirin 81 mg along with Eliquis.  She reverted back to A. fib after cardioversion on 02/09/2021. - We will do work-up including lupus anticoagulant, anticardiolipin antibodies  and antibeta-2 glycoprotein 1 antibodies.  We will also check D-dimer. - We will do another hypercoagulable tests including factor V Leiden, prothrombin gene mutation, protein C&S activity and antigen, Antithrombin III. - RTC 1 week for follow-up to discuss results.   All questions were answered. The patient knows to call the clinic with any problems, questions or concerns.  Derek Jack, MD 02/13/21 10:31 AM  [place] [phone]   I, Thana Ates, am acting as a scribe for Dr. Derek Jack.  I, Derek Jack MD, have reviewed the above documentation for accuracy and completeness, and I agree with the above.

## 2021-02-13 ENCOUNTER — Other Ambulatory Visit: Payer: Self-pay

## 2021-02-13 ENCOUNTER — Encounter (HOSPITAL_COMMUNITY): Payer: Self-pay | Admitting: Hematology

## 2021-02-13 ENCOUNTER — Inpatient Hospital Stay (HOSPITAL_COMMUNITY): Payer: Medicare Other

## 2021-02-13 ENCOUNTER — Inpatient Hospital Stay (HOSPITAL_COMMUNITY): Payer: Medicare Other | Attending: Hematology | Admitting: Hematology

## 2021-02-13 VITALS — BP 129/81 | HR 112 | Temp 97.4°F | Resp 18 | Ht 66.0 in | Wt 192.0 lb

## 2021-02-13 DIAGNOSIS — Z7901 Long term (current) use of anticoagulants: Secondary | ICD-10-CM

## 2021-02-13 DIAGNOSIS — Z8673 Personal history of transient ischemic attack (TIA), and cerebral infarction without residual deficits: Secondary | ICD-10-CM

## 2021-02-13 DIAGNOSIS — D6869 Other thrombophilia: Secondary | ICD-10-CM | POA: Diagnosis present

## 2021-02-13 DIAGNOSIS — I4891 Unspecified atrial fibrillation: Secondary | ICD-10-CM | POA: Diagnosis not present

## 2021-02-13 DIAGNOSIS — Z7982 Long term (current) use of aspirin: Secondary | ICD-10-CM | POA: Diagnosis not present

## 2021-02-13 LAB — D-DIMER, QUANTITATIVE: D-Dimer, Quant: <0.27 ug/mL-FEU (ref 0.00–0.50)

## 2021-02-13 NOTE — Patient Instructions (Signed)
[facility] at [facility] Discharge Instructions  You were seen and examined today by Dr. Ellin Saba. Dr. Ellin Saba is a hematologist, meaning that he specializes in blood disorders. Dr. Ellin Saba discussed your past medical history, family history of blood disorders/cancers, and the events that led to you being here today.  You were referred to Dr. Ellin Saba due to your recent clot that resulted in a stroke. Dr. Ellin Saba has recommended additional lab work to identify if there is any reason that you are at increased risk for developing clots.  Follow-up as scheduled.   Thank you for choosing [facility] at [facility] to provide your oncology and hematology care.  To afford each patient quality time with our provider, please arrive at least 15 minutes before your scheduled appointment time.   If you have a lab appointment with the Cancer Center please come in thru the Main Entrance and check in at the main information desk.  You need to re-schedule your appointment should you arrive 10 or more minutes late.  We strive to give you quality time with our providers, and arriving late affects you and other patients whose appointments are after yours.  Also, if you no show three or more times for appointments you may be dismissed from the clinic at the providers discretion.     Again, thank you for choosing [facility].  Our hope is that these requests will decrease the amount of time that you wait before being seen by our physicians.       _____________________________________________________________  Should you have questions after your visit to [facility], please contact our office at [phone] and follow the prompts.  Our office hours are 8:00 a.m. and 4:30 p.m. Monday - Friday.  Please note that voicemails left after 4:00 p.m. may not be returned until the following business day.  We are closed weekends and major  holidays.  You do have access to a nurse 24-7, just call the main number to the clinic [phone] and do not press any options, hold on the line and a nurse will answer the phone.    For prescription refill requests, have your pharmacy contact our office and allow 72 hours.    Due to Covid, you will need to wear a mask upon entering the hospital. If you do not have a mask, a mask will be given to you at the Main Entrance upon arrival. For doctor visits, patients may have 1 support person age 55 or older with them. For treatment visits, patients can not have anyone with them due to social distancing guidelines and our immunocompromised population.

## 2021-02-14 LAB — PROTEIN C ACTIVITY: Protein C Activity: 124 % (ref 73–180)

## 2021-02-14 LAB — LUPUS ANTICOAGULANT PANEL
DRVVT: 71.5 s — ABNORMAL HIGH (ref 0.0–47.0)
PTT Lupus Anticoagulant: 33.4 s (ref 0.0–51.9)

## 2021-02-14 LAB — ANTITHROMBIN III ANTIGEN: AT III AG PPP IMM-ACNC: 89 % (ref 72–124)

## 2021-02-14 LAB — PROTEIN S ACTIVITY: Protein S Activity: 119 % (ref 63–140)

## 2021-02-14 LAB — DRVVT CONFIRM: dRVVT Confirm: 1.4 ratio — ABNORMAL HIGH (ref 0.8–1.2)

## 2021-02-14 LAB — DRVVT MIX: dRVVT Mix: 56.2 s — ABNORMAL HIGH (ref 0.0–40.4)

## 2021-02-15 LAB — BETA-2-GLYCOPROTEIN I ABS, IGG/M/A
Beta-2 Glyco I IgG: <9 GPI IgG units (ref 0–20)
Beta-2-Glycoprotein I IgA: <9 GPI IgA units (ref 0–25)
Beta-2-Glycoprotein I IgM: <9 GPI IgM units (ref 0–32)

## 2021-02-16 ENCOUNTER — Ambulatory Visit (HOSPITAL_COMMUNITY): Payer: Medicare Other | Admitting: Physician Assistant

## 2021-02-16 LAB — CARDIOLIPIN ANTIBODIES, IGG, IGM, IGA
Anticardiolipin IgA: <9 APL U/mL (ref 0–11)
Anticardiolipin IgG: <9 GPL U/mL (ref 0–14)
Anticardiolipin IgM: <9 MPL U/mL (ref 0–12)

## 2021-02-16 LAB — PROTEIN C AG + PROTEIN S AG
Protein C, Total: 101 % (ref 60–150)
Protein S Ag, Free: 113 % (ref 61–136)
Protein S Ag, Total: 112 % (ref 60–150)

## 2021-02-17 ENCOUNTER — Other Ambulatory Visit: Payer: Self-pay

## 2021-02-17 ENCOUNTER — Other Ambulatory Visit (HOSPITAL_COMMUNITY)
Admission: RE | Admit: 2021-02-17 | Discharge: 2021-02-17 | Disposition: A | Discharge status: Home / Self Care | Payer: Medicare Other | Source: Ambulatory Visit | Attending: Internal Medicine | Admitting: Internal Medicine

## 2021-02-17 ENCOUNTER — Encounter: Payer: Self-pay | Admitting: *Deleted

## 2021-02-17 ENCOUNTER — Encounter: Payer: Self-pay | Admitting: Internal Medicine

## 2021-02-17 ENCOUNTER — Ambulatory Visit: Payer: Medicare Other | Admitting: Internal Medicine

## 2021-02-17 VITALS — BP 112/64 | HR 88 | Ht 66.5 in | Wt 193.2 lb

## 2021-02-17 DIAGNOSIS — I484 Atypical atrial flutter: Secondary | ICD-10-CM

## 2021-02-17 LAB — BASIC METABOLIC PANEL
Anion gap: 5 (ref 5–15)
BUN: 21 mg/dL (ref 8–23)
CO2: 27 mmol/L (ref 22–32)
Calcium: 9.7 mg/dL (ref 8.9–10.3)
Chloride: 105 mmol/L (ref 98–111)
Creatinine, Ser: 0.95 mg/dL (ref 0.44–1.00)
GFR, Estimated: >60 mL/min (ref >60)
Glucose, Bld: 98 mg/dL (ref 70–99)
Potassium: 4.3 mmol/L (ref 3.5–5.1)
Sodium: 137 mmol/L (ref 135–145)

## 2021-02-17 LAB — CBC
HCT: 45.5 % (ref 36.0–46.0)
Hemoglobin: 15.1 g/dL — ABNORMAL HIGH (ref 12.0–15.0)
MCH: 30.8 pg (ref 26.0–34.0)
MCHC: 33.2 g/dL (ref 30.0–36.0)
MCV: 92.7 fL (ref 80.0–100.0)
Platelets: 255 10*3/uL (ref 150–400)
RBC: 4.91 MIL/uL (ref 3.87–5.11)
RDW: 12.7 % (ref 11.5–15.5)
WBC: 8.6 10*3/uL (ref 4.0–10.5)
nRBC: 0 % (ref 0.0–0.2)

## 2021-02-17 NOTE — Progress Notes (Signed)
HPI Renee Benitez returns today for followup. She is an obese 70 yo woman with a h/o HTN and PAF who had been well controlled on flecainide. She has developed atrial flutter with a variable but often 2:1 AV conduction. The patient underwent DCCV and had ERAF. She feels her heart racing and she has not had syncope. No chest pain but sob with exertion.  No Known Allergies   Current Outpatient Medications  Medication Sig Dispense Refill   acetaminophen (TYLENOL) 500 MG tablet Take 1,000 mg by mouth every 6 (six) hours as needed for moderate pain or headache.     aspirin 81 MG EC tablet Take 1 tablet (81 mg total) by mouth daily. 150 tablet 2   Calcium Carb-Cholecalciferol (CALCIUM 600 + D PO) Take 1 tablet by mouth every evening.     Cholecalciferol (DIALYVITE VITAMIN D 5000) 125 MCG (5000 UT) capsule Take 5,000 Units by mouth every evening.     diltiazem (CARDIZEM) 60 MG tablet Take 60 mg by mouth daily as needed (afib).     diltiazem (CARTIA XT) 180 MG 24 hr capsule Take 1 capsule (180 mg total) by mouth daily. 180 capsule 3   ELIQUIS 5 MG TABS tablet TAKE ONE TABLET (5MG  TOTAL) BY MOUTH TWOTIMES DAILY 180 tablet 3   escitalopram (LEXAPRO) 5 MG tablet Take 5 mg by mouth in the morning.     flecainide (TAMBOCOR) 100 MG tablet Take 1 tablet (100 mg total) by mouth 2 (two) times daily. 180 tablet 3   levothyroxine (SYNTHROID) 50 MCG tablet Take 50 mcg by mouth daily at 2 am.     losartan (COZAAR) 25 MG tablet Take 25 mg by mouth every evening.     magnesium oxide (MAG-OX) 400 MG tablet Take 400 mg by mouth at bedtime.     metoprolol tartrate (LOPRESSOR) 25 MG tablet Take 1 tablet by mouth every 8 hours as needed for breakthrough afib (Patient taking differently: Take 12.5 mg by mouth every 8 (eight) hours as needed (breakthrough afib).) 60 tablet 3   Multiple Vitamins-Minerals (MULTIVITAMIN WITH MINERALS) tablet Take 1 tablet by mouth in the morning. Women's One A Day     Multiple  Vitamins-Minerals (PRESERVISION AREDS 2) CAPS Take 1 capsule by mouth 2 (two) times daily.     Omega-3 Fatty Acids (FISH OIL ULTRA) 1400 MG CAPS Take 1,400 mg by mouth in the morning.     pantoprazole (PROTONIX) 40 MG tablet Take 1 tablet (40 mg total) by mouth daily before breakfast. 90 tablet 0   rosuvastatin (CRESTOR) 20 MG tablet Take 20 mg by mouth in the morning.     No current facility-administered medications for this visit.     Past Medical History:  Diagnosis Date   Atrial fibrillation [facility])    Current use of estrogen therapy 01/31/2013   Dysrhythmia    AFib   Hypertension    Macular degeneration    Menopausal syndrome    Right ovarian cyst 01/25/2014    ROS:   All systems reviewed and negative except as noted in the HPI.   Past Surgical History:  Procedure Laterality Date   ABDOMINAL HYSTERECTOMY     APPENDECTOMY     CARDIOVERSION N/A 09/14/2016   Procedure: CARDIOVERSION;  Surgeon: Satira Sark, MD;  [facility];  Service: Cardiovascular;  Laterality: N/A;   CARDIOVERSION N/A 02/09/2021   Procedure: CARDIOVERSION;  Surgeon: Josue Hector, MD;  Location: [place];  Service: Cardiovascular;  Laterality: N/A;   CATARACT EXTRACTION W/ INTRAOCULAR LENS  IMPLANT, BILATERAL Bilateral    COLONOSCOPY  12/04/03   Friable anal canal otherwise normal rectum and colon   COLONOSCOPY N/A 03/19/2013   Dr. Gala Romney: diverticulosis, hemorrhoids. next TCS 10 years   COLONOSCOPY N/A 05/02/2019   Procedure: COLONOSCOPY;  Surgeon: Daneil Dolin, MD;  [facility];  Service: Endoscopy;  Laterality: N/A;  7:30am   ESOPHAGOGASTRODUODENOSCOPY N/A 10/17/2018   Procedure: ESOPHAGOGASTRODUODENOSCOPY (EGD);  Surgeon: Daneil Dolin, MD;  [facility];  Service: Endoscopy;  Laterality: N/A;  3:00pm   IR CT HEAD LTD  01/05/2021   IR CT HEAD LTD  01/05/2021   IR INTRA CRAN STENT  01/05/2021   IR PERCUTANEOUS ART THROMBECTOMY/INFUSION INTRACRANIAL INC DIAG ANGIO   01/05/2021   IR US GUIDE VASC ACCESS LEFT  01/05/2021   MALONEY DILATION N/A 10/17/2018   Procedure: Venia Minks DILATION;  Surgeon: Daneil Dolin, MD;  [facility];  Service: Endoscopy;  Laterality: N/A;   RADIOLOGY WITH ANESTHESIA N/A 01/05/2021   Procedure: IR WITH ANESTHESIA;  Surgeon: Radiologist, Medication, MD;  Location: [place];  Service: Radiology;  Laterality: N/A;   TEE WITHOUT CARDIOVERSION N/A 09/14/2016   Procedure: TRANSESOPHAGEAL ECHOCARDIOGRAM (TEE) WITH PROPOFOL;  Surgeon: Satira Sark, MD;  [facility];  Service: Cardiovascular;  Laterality: N/A;   TONSILECTOMY, ADENOIDECTOMY, BILATERAL MYRINGOTOMY AND TUBES       Family History  Problem Relation Age of Onset   Coronary artery disease Father    Cancer Father        bladder cancer   Arrhythmia Sister        Reportedly had ablation of SVT   Hypertension Sister    Colon cancer Sister 26       s/p surgical resection, no chemo/radiation   Colon cancer Maternal Grandmother    Hypertension Son      Social History   Socioeconomic History   Marital status: Married    Spouse name: Not on file   Number of children: Not on file   Years of education: Not on file   Highest education level: Not on file  Occupational History   Occupation: Nurse    Comment: Personal assistant  Tobacco Use   Smoking status: Never   Smokeless tobacco: Never   Tobacco comments:    Never smoked  Vaping Use   Vaping Use: Never used  Substance and Sexual Activity   Alcohol use: Not Currently    Alcohol/week: 1.0 standard drink    Types: 1 Glasses of wine per week    Comment: occ wine   Drug use: No   Sexual activity: Yes    Birth control/protection: Surgical    Comment: hyst  Other Topics Concern   Not on file  Social History Narrative   Not on file   Social Determinants of Health   Financial Resource Strain: Low Risk    Difficulty of Paying Living Expenses: Not hard at all  Food Insecurity: No Food Insecurity    Worried About Charity fundraiser in the Last Year: Never true   [place] in the Last Year: Never true  Transportation Needs: No Transportation Needs   Lack of Transportation (Medical): No   Lack of Transportation (Non-Medical): No  Physical Activity: Insufficiently Active   Days of Exercise per Week: 1 day   Minutes of Exercise per Session: 30 min  Stress: Stress Concern Present  Feeling of Stress : To some extent  Social Connections: Socially Integrated   Frequency of Communication with Friends and Family: Three times a week   Frequency of Social Gatherings with Friends and Family: Twice a week   Attends Religious Services: More than 4 times per year   Active Member of [employer] or Organizations: Yes   Attends Engineer, structural: More than 4 times per year   Marital Status: Married  Catering manager Violence: Not At Risk   Fear of Current or Ex-Partner: No   Emotionally Abused: No   Physically Abused: No   Sexually Abused: No     BP 112/64   Pulse 88   Ht 5' 6.5" (1.689 m)   Wt 193 lb 3.2 oz (87.6 kg)   SpO2 99%   BMI 30.72 kg/m   Physical Exam:  Well appearing 70 yo woman, NAD HEENT: Unremarkable Neck:  No JVD, no thyromegally Lymphatics:  No adenopathy Back:  No CVA tenderness Lungs:  Clear with no wheezes HEART:  IRegular rate rhythm, no murmurs, no rubs, no clicks Abd:  soft, positive bowel sounds, no organomegally, no rebound, no guarding Ext:  2 plus pulses, no edema, no cyanosis, no clubbing Skin:  No rashes no nodules Neuro:  CN II through XII intact, motor grossly intact  EKG - typical atrial flutter with a Variable VR  Assess/Plan:  Atrial flutter - I have discussed the various treatment options with atrial flutter vs atrial fib and flutter vs rate control. She would like to treat with flutter ablation and continued flecainide. Stroke - she had missed a dose of her eliquis. She underwent stenting.  HTN - her bp is well controlled.  PAF -  she has been well controlled on flecainide.   Sharlot Gowda Shaterra Sanzone,MD

## 2021-02-17 NOTE — Patient Instructions (Signed)
Medication Instructions:  Your physician recommends that you continue on your current medications as directed. Please refer to the Current Medication list given to you today.  *If you need a refill on your cardiac medications before your next appointment, please call your pharmacy*   Lab Work: Your physician recommends that you return for lab work in: Today (CBC, BMET)   If you have labs (blood work) drawn today and your tests are completely normal, you will receive your results only by: MyChart Message (if you have MyChart) OR A paper copy in the mail If you have any lab test that is abnormal or we need to change your treatment, we will call you to review the results.   Testing/Procedures: Your physician has recommended that you have an ablation. Catheter ablation is a medical procedure used to treat some cardiac arrhythmias (irregular heartbeats). During catheter ablation, a long, thin, flexible tube is put into a blood vessel in your groin (upper thigh), or neck. This tube is called an ablation catheter. It is then guided to your heart through the blood vessel. Radio frequency waves destroy small areas of heart tissue where abnormal heartbeats may cause an arrhythmia to start. Please see the instruction sheet given to you today.    Follow-Up: At [facility], you and your health needs are our priority.  As part of our continuing mission to provide you with exceptional heart care, we have created designated Provider Care Teams.  These Care Teams include your primary Cardiologist (physician) and Advanced Practice Providers (APPs -  Physician Assistants and Nurse Practitioners) who all work together to provide you with the care you need, when you need it.  We recommend signing up for the patient portal called "MyChart".  Sign up information is provided on this After Visit Summary.  MyChart is used to connect with patients for Virtual Visits (Telemedicine).  Patients are able to view lab/test  results, encounter notes, upcoming appointments, etc.  Non-urgent messages can be sent to your provider as well.   To learn more about what you can do with MyChart, go to [email or web address].    Your next appointment:    After Ablation   The format for your next appointment:   In Person  Provider:   Lewayne Bunting, MD    Other Instructions Thank you for choosing  [facility]!

## 2021-02-17 NOTE — H&P (View-Only) (Signed)
HPI [name] returns today for followup. She is an obese 70 yo woman with a h/o HTN and PAF who had been well controlled on flecainide. She has developed atrial flutter with a variable but often 2:1 AV conduction. The patient underwent DCCV and had ERAF. She feels her heart racing and she has not had syncope. No chest pain but sob with exertion.  No Known Allergies   Current Outpatient Medications  Medication Sig Dispense Refill   acetaminophen (TYLENOL) 500 MG tablet Take 1,000 mg by mouth every 6 (six) hours as needed for moderate pain or headache.     aspirin 81 MG EC tablet Take 1 tablet (81 mg total) by mouth daily. 150 tablet 2   Calcium Carb-Cholecalciferol (CALCIUM 600 + D PO) Take 1 tablet by mouth every evening.     Cholecalciferol (DIALYVITE VITAMIN D 5000) 125 MCG (5000 UT) capsule Take 5,000 Units by mouth every evening.     diltiazem (CARDIZEM) 60 MG tablet Take 60 mg by mouth daily as needed (afib).     diltiazem (CARTIA XT) 180 MG 24 hr capsule Take 1 capsule (180 mg total) by mouth daily. 180 capsule 3   ELIQUIS 5 MG TABS tablet TAKE ONE TABLET (5MG  TOTAL) BY MOUTH TWOTIMES DAILY 180 tablet 3   escitalopram (LEXAPRO) 5 MG tablet Take 5 mg by mouth in the morning.     flecainide (TAMBOCOR) 100 MG tablet Take 1 tablet (100 mg total) by mouth 2 (two) times daily. 180 tablet 3   levothyroxine (SYNTHROID) 50 MCG tablet Take 50 mcg by mouth daily at 2 am.     losartan (COZAAR) 25 MG tablet Take 25 mg by mouth every evening.     magnesium oxide (MAG-OX) 400 MG tablet Take 400 mg by mouth at bedtime.     metoprolol tartrate (LOPRESSOR) 25 MG tablet Take 1 tablet by mouth every 8 hours as needed for breakthrough afib (Patient taking differently: Take 12.5 mg by mouth every 8 (eight) hours as needed (breakthrough afib).) 60 tablet 3   Multiple Vitamins-Minerals (MULTIVITAMIN WITH MINERALS) tablet Take 1 tablet by mouth in the morning. Women's One A Day     Multiple  Vitamins-Minerals (PRESERVISION AREDS 2) CAPS Take 1 capsule by mouth 2 (two) times daily.     Omega-3 Fatty Acids (FISH OIL ULTRA) 1400 MG CAPS Take 1,400 mg by mouth in the morning.     pantoprazole (PROTONIX) 40 MG tablet Take 1 tablet (40 mg total) by mouth daily before breakfast. 90 tablet 0   rosuvastatin (CRESTOR) 20 MG tablet Take 20 mg by mouth in the morning.     No current facility-administered medications for this visit.     Past Medical History:  Diagnosis Date   Atrial fibrillation [facility])    Current use of estrogen therapy 01/31/2013   Dysrhythmia    AFib   Hypertension    Macular degeneration    Menopausal syndrome    Right ovarian cyst 01/25/2014    ROS:   All systems reviewed and negative except as noted in the HPI.   Past Surgical History:  Procedure Laterality Date   ABDOMINAL HYSTERECTOMY     APPENDECTOMY     CARDIOVERSION N/A 09/14/2016   Procedure: CARDIOVERSION;  Surgeon: Satira Sark, MD;  [facility];  Service: Cardiovascular;  Laterality: N/A;   CARDIOVERSION N/A 02/09/2021   Procedure: CARDIOVERSION;  Surgeon: Josue Hector, MD;  Location: [place];  Service: Cardiovascular;  Laterality: N/A;   CATARACT EXTRACTION W/ INTRAOCULAR LENS  IMPLANT, BILATERAL Bilateral    COLONOSCOPY  12/04/03   Friable anal canal otherwise normal rectum and colon   COLONOSCOPY N/A 03/19/2013   Dr. Gala Romney: diverticulosis, hemorrhoids. next TCS 10 years   COLONOSCOPY N/A 05/02/2019   Procedure: COLONOSCOPY;  Surgeon: Daneil Dolin, MD;  [facility];  Service: Endoscopy;  Laterality: N/A;  7:30am   ESOPHAGOGASTRODUODENOSCOPY N/A 10/17/2018   Procedure: ESOPHAGOGASTRODUODENOSCOPY (EGD);  Surgeon: Daneil Dolin, MD;  [facility];  Service: Endoscopy;  Laterality: N/A;  3:00pm   IR CT HEAD LTD  01/05/2021   IR CT HEAD LTD  01/05/2021   IR INTRA CRAN STENT  01/05/2021   IR PERCUTANEOUS ART THROMBECTOMY/INFUSION INTRACRANIAL INC DIAG ANGIO   01/05/2021   IR US GUIDE VASC ACCESS LEFT  01/05/2021   MALONEY DILATION N/A 10/17/2018   Procedure: Renee Benitez DILATION;  Surgeon: Daneil Dolin, MD;  [facility];  Service: Endoscopy;  Laterality: N/A;   RADIOLOGY WITH ANESTHESIA N/A 01/05/2021   Procedure: IR WITH ANESTHESIA;  Surgeon: Radiologist, Medication, MD;  Location: [place];  Service: Radiology;  Laterality: N/A;   TEE WITHOUT CARDIOVERSION N/A 09/14/2016   Procedure: TRANSESOPHAGEAL ECHOCARDIOGRAM (TEE) WITH PROPOFOL;  Surgeon: Satira Sark, MD;  [facility];  Service: Cardiovascular;  Laterality: N/A;   TONSILECTOMY, ADENOIDECTOMY, BILATERAL MYRINGOTOMY AND TUBES       Family History  Problem Relation Age of Onset   Coronary artery disease Father    Cancer Father        bladder cancer   Arrhythmia Sister        Reportedly had ablation of SVT   Hypertension Sister    Colon cancer Sister 79       s/p surgical resection, no chemo/radiation   Colon cancer Maternal Grandmother    Hypertension Son      Social History   Socioeconomic History   Marital status: Married    Spouse name: Not on file   Number of children: Not on file   Years of education: Not on file   Highest education level: Not on file  Occupational History   Occupation: Nurse    Comment: Personal assistant  Tobacco Use   Smoking status: Never   Smokeless tobacco: Never   Tobacco comments:    Never smoked  Vaping Use   Vaping Use: Never used  Substance and Sexual Activity   Alcohol use: Not Currently    Alcohol/week: 1.0 standard drink    Types: 1 Glasses of wine per week    Comment: occ wine   Drug use: No   Sexual activity: Yes    Birth control/protection: Surgical    Comment: hyst  Other Topics Concern   Not on file  Social History Narrative   Not on file   Social Determinants of Health   Financial Resource Strain: Low Risk    Difficulty of Paying Living Expenses: Not hard at all  Food Insecurity: No Food Insecurity    Worried About Charity fundraiser in the Last Year: Never true   [place] in the Last Year: Never true  Transportation Needs: No Transportation Needs   Lack of Transportation (Medical): No   Lack of Transportation (Non-Medical): No  Physical Activity: Insufficiently Active   Days of Exercise per Week: 1 day   Minutes of Exercise per Session: 30 min  Stress: Stress Concern Present  Feeling of Stress : To some extent  Social Connections: Socially Integrated   Frequency of Communication with Friends and Family: Three times a week   Frequency of Social Gatherings with Friends and Family: Twice a week   Attends Religious Services: More than 4 times per year   Active Member of [employer] or Organizations: Yes   Attends Engineer, structural: More than 4 times per year   Marital Status: Married  Catering manager Violence: Not At Risk   Fear of Current or Ex-Partner: No   Emotionally Abused: No   Physically Abused: No   Sexually Abused: No     BP 112/64   Pulse 88   Ht 5' 6.5" (1.689 m)   Wt 193 lb 3.2 oz (87.6 kg)   SpO2 99%   BMI 30.72 kg/m   Physical Exam:  Well appearing 70 yo woman, NAD HEENT: Unremarkable Neck:  No JVD, no thyromegally Lymphatics:  No adenopathy Back:  No CVA tenderness Lungs:  Clear with no wheezes HEART:  IRegular rate rhythm, no murmurs, no rubs, no clicks Abd:  soft, positive bowel sounds, no organomegally, no rebound, no guarding Ext:  2 plus pulses, no edema, no cyanosis, no clubbing Skin:  No rashes no nodules Neuro:  CN II through XII intact, motor grossly intact  EKG - typical atrial flutter with a Variable VR  Assess/Plan:  Atrial flutter - I have discussed the various treatment options with atrial flutter vs atrial fib and flutter vs rate control. She would like to treat with flutter ablation and continued flecainide. Stroke - she had missed a dose of her eliquis. She underwent stenting.  HTN - her bp is well controlled.  PAF -  she has been well controlled on flecainide.   Sharlot Gowda Renee Blasco,MD

## 2021-02-18 NOTE — Progress Notes (Shared)
Renee Benitez, Renee Benitez [id number]   CLINIC:  Medical Oncology/Hematology  PCP:  Caren Macadam, [place] / [place] [address]  [phone]  REASON FOR VISIT:  Follow-up for ***  PRIOR THERAPY: ***  CURRENT THERAPY: ***  INTERVAL HISTORY:  [name], a 70 y.o. female, returns for routine follow-up for her ***. Renee Benitez was last seen on {XX/XX/XXXX}.   REVIEW OF SYSTEMS:  Review of Systems - Oncology  PAST MEDICAL/SURGICAL HISTORY:  Past Medical History:  Diagnosis Date   Atrial fibrillation ([place])    Current use of estrogen therapy 01/31/2013   Dysrhythmia    AFib   Hypertension    Macular degeneration    Menopausal syndrome    Right ovarian cyst 01/25/2014   Past Surgical History:  Procedure Laterality Date   ABDOMINAL HYSTERECTOMY     APPENDECTOMY     CARDIOVERSION N/A 09/14/2016   Procedure: CARDIOVERSION;  Surgeon: Satira Sark, MD;  Location: [facility];  Service: Cardiovascular;  Laterality: N/A;   CARDIOVERSION N/A 02/09/2021   Procedure: CARDIOVERSION;  Surgeon: Josue Hector, MD;  Location: [facility] ENDOSCOPY;  Service: Cardiovascular;  Laterality: N/A;   CATARACT EXTRACTION W/ INTRAOCULAR LENS  IMPLANT, BILATERAL Bilateral    COLONOSCOPY  12/04/03   Friable anal canal otherwise normal rectum and colon   COLONOSCOPY N/A 03/19/2013   Dr. Gala Romney: diverticulosis, hemorrhoids. next TCS 10 years   COLONOSCOPY N/A 05/02/2019   Procedure: COLONOSCOPY;  Surgeon: Daneil Dolin, MD;  Location: [facility];  Service: Endoscopy;  Laterality: N/A;  7:30am   ESOPHAGOGASTRODUODENOSCOPY N/A 10/17/2018   Procedure: ESOPHAGOGASTRODUODENOSCOPY (EGD);  Surgeon: Daneil Dolin, MD;  [facility];  Service: Endoscopy;  Laterality: N/A;  3:00pm   IR CT HEAD LTD  01/05/2021   IR CT HEAD LTD  01/05/2021   IR INTRA CRAN STENT  01/05/2021   IR PERCUTANEOUS ART THROMBECTOMY/INFUSION INTRACRANIAL INC DIAG ANGIO  01/05/2021    IR US GUIDE VASC ACCESS LEFT  01/05/2021   MALONEY DILATION N/A 10/17/2018   Procedure: Venia Minks DILATION;  Surgeon: Daneil Dolin, MD;  [facility];  Service: Endoscopy;  Laterality: N/A;   RADIOLOGY WITH ANESTHESIA N/A 01/05/2021   Procedure: IR WITH ANESTHESIA;  Surgeon: Radiologist, Medication, MD;  Location: [facility];  Service: Radiology;  Laterality: N/A;   TEE WITHOUT CARDIOVERSION N/A 09/14/2016   Procedure: TRANSESOPHAGEAL ECHOCARDIOGRAM (TEE) WITH PROPOFOL;  Surgeon: Satira Sark, MD;  Location: [facility];  Service: Cardiovascular;  Laterality: N/A;   TONSILECTOMY, ADENOIDECTOMY, BILATERAL MYRINGOTOMY AND TUBES      SOCIAL HISTORY:  Social History   Socioeconomic History   Marital status: Married    Spouse name: Not on file   Number of children: Not on file   Years of education: Not on file   Highest education level: Not on file  Occupational History   Occupation: Nurse    Comment: [employer]  Tobacco Use   Smoking status: Never   Smokeless tobacco: Never   Tobacco comments:    Never smoked  Vaping Use   Vaping Use: Never used  Substance and Sexual Activity   Alcohol use: Not Currently    Alcohol/week: 1.0 standard drink    Types: 1 Glasses of wine per week    Comment: occ wine   Drug use: No   Sexual activity: Yes    Birth control/protection: Surgical    Comment: hyst  Other Topics Concern   Not on file  Social History Narrative   Not on file   Social Determinants of Health   Financial Resource Strain: Low Risk    Difficulty of Paying Living Expenses: Not hard at all  Food Insecurity: No Food Insecurity   Worried About Programme researcher, broadcasting/film/video in the Last Year: Never true   Ran Out of Food in the Last Year: Never true  Transportation Needs: No Transportation Needs   Lack of Transportation (Medical): No   Lack of Transportation (Non-Medical): No  Physical Activity: Insufficiently Active   Days of Exercise per Week: 1 day   Minutes of  Exercise per Session: 30 min  Stress: Stress Concern Present   Feeling of Stress : To some extent  Social Connections: Socially Integrated   Frequency of Communication with Friends and Family: Three times a week   Frequency of Social Gatherings with Friends and Family: Twice a week   Attends Religious Services: More than 4 times per year   Active Member of [employer] or Organizations: Yes   Attends Engineer, structural: More than 4 times per year   Marital Status: Married  Catering manager Violence: Not At Risk   Fear of Current or Ex-Partner: No   Emotionally Abused: No   Physically Abused: No   Sexually Abused: No    FAMILY HISTORY:  Family History  Problem Relation Age of Onset   Coronary artery disease Father    Cancer Father        bladder cancer   Arrhythmia Sister        Reportedly had ablation of SVT   Hypertension Sister    Colon cancer Sister 56       s/p surgical resection, no chemo/radiation   Colon cancer Maternal Grandmother    Hypertension Son     CURRENT MEDICATIONS:  Current Outpatient Medications  Medication Sig Dispense Refill   acetaminophen (TYLENOL) 500 MG tablet Take 1,000 mg by mouth every 6 (six) hours as needed for moderate pain or headache.     aspirin 81 MG EC tablet Take 1 tablet (81 mg total) by mouth daily. 150 tablet 2   Calcium Carb-Cholecalciferol (CALCIUM 600 + D PO) Take 1 tablet by mouth every evening.     Cholecalciferol (DIALYVITE VITAMIN D 5000) 125 MCG (5000 UT) capsule Take 5,000 Units by mouth every evening.     diltiazem (CARDIZEM) 60 MG tablet Take 60 mg by mouth daily as needed (afib).     diltiazem (CARTIA XT) 180 MG 24 hr capsule Take 1 capsule (180 mg total) by mouth daily. 180 capsule 3   ELIQUIS 5 MG TABS tablet TAKE ONE TABLET (5MG  TOTAL) BY MOUTH TWOTIMES DAILY 180 tablet 3   escitalopram (LEXAPRO) 5 MG tablet Take 5 mg by mouth in the morning.     flecainide (TAMBOCOR) 100 MG tablet Take 1 tablet (100 mg total) by  mouth 2 (two) times daily. 180 tablet 3   levothyroxine (SYNTHROID) 50 MCG tablet Take 50 mcg by mouth daily at 2 am.     losartan (COZAAR) 25 MG tablet Take 25 mg by mouth every evening.     magnesium oxide (MAG-OX) 400 MG tablet Take 400 mg by mouth at bedtime.     metoprolol tartrate (LOPRESSOR) 25 MG tablet Take 1 tablet by mouth every 8 hours as needed for breakthrough afib (Patient taking differently: Take 12.5 mg by mouth every 8 (eight) hours as needed (breakthrough afib).) 60  tablet 3   Multiple Vitamins-Minerals (MULTIVITAMIN WITH MINERALS) tablet Take 1 tablet by mouth in the morning. Women's One A Day     Multiple Vitamins-Minerals (PRESERVISION AREDS 2) CAPS Take 1 capsule by mouth 2 (two) times daily.     Omega-3 Fatty Acids (FISH OIL ULTRA) 1400 MG CAPS Take 1,400 mg by mouth in the morning.     pantoprazole (PROTONIX) 40 MG tablet Take 1 tablet (40 mg total) by mouth daily before breakfast. 90 tablet 0   rosuvastatin (CRESTOR) 20 MG tablet Take 20 mg by mouth in the morning.     No current facility-administered medications for this visit.    ALLERGIES:  No Known Allergies  PHYSICAL EXAM:  Performance status (ECOG): {CHL ONC [id number]  There were no vitals filed for this visit. Wt Readings from Last 3 Encounters:  02/17/21 193 lb 3.2 oz (87.6 kg)  02/13/21 192 lb (87.1 kg)  02/09/21 196 lb 6.9 oz (89.1 kg)   Physical Exam  LABORATORY DATA:  I have reviewed the labs as listed.  CBC Latest Ref Rng & Units 02/17/2021 01/28/2021 01/05/2021  WBC 4.0 - 10.5 K/uL 8.6 8.7 -  Hemoglobin 12.0 - 15.0 g/dL 15.1(H) 14.6 13.9  Hematocrit 36.0 - 46.0 % 45.5 45.0 41.0  Platelets 150 - 400 K/uL 255 254 -   CMP Latest Ref Rng & Units 02/17/2021 01/28/2021 01/05/2021  Glucose 70 - 99 mg/dL 98 100(H) 92  BUN 8 - 23 mg/dL 21 14 16   Creatinine 0.44 - 1.00 mg/dL 0.95 0.98 0.90  Sodium 135 - 145 mmol/L 137 139 142  Potassium 3.5 - 5.1 mmol/L 4.3 4.4 4.4  Chloride 98 - 111 mmol/L 105  108 106  CO2 22 - 32 mmol/L 27 25 -  Calcium 8.9 - 10.3 mg/dL 9.7 9.6 -  Total Protein 6.5 - 8.1 g/dL - - -  Total Bilirubin 0.3 - 1.2 mg/dL - - -  Alkaline Phos 38 - 126 U/L - - -  AST 15 - 41 U/L - - -  ALT 0 - 44 U/L - - -      Component Value Date/Time   RBC 4.91 02/17/2021 1017   MCV 92.7 02/17/2021 1017   MCH 30.8 02/17/2021 1017   MCHC 33.2 02/17/2021 1017   RDW 12.7 02/17/2021 1017   LYMPHSABS 2.7 01/05/2021 1206   MONOABS 0.5 01/05/2021 1206   EOSABS 0.1 01/05/2021 1206   BASOSABS 0.1 01/05/2021 1206    DIAGNOSTIC IMAGING:  I have independently reviewed the scans and discussed with the patient. No results found.   ASSESSMENT:  ***   PLAN:  ***  Orders placed this encounter:  No orders of the defined types were placed in this encounter.    Derek Jack, MD [facility] [phone]   I, ***, am acting as a scribe for Dr. Derek Jack.  {Add Barista Statement}

## 2021-02-19 LAB — FACTOR 5 LEIDEN

## 2021-02-19 LAB — PROTHROMBIN GENE MUTATION

## 2021-02-20 ENCOUNTER — Ambulatory Visit (HOSPITAL_COMMUNITY): Payer: Medicare Other | Admitting: Hematology

## 2021-02-22 NOTE — Progress Notes (Signed)
[facility] [address], [place] [address]   CLINIC:  Medical Oncology/Hematology  PCP:  Aliene Beams, MD [address] / [place] [address]  [phone]  REASON FOR VISIT:  Follow-up for hypercoagulable state  PRIOR THERAPY: none  CURRENT THERAPY: 5 mg Eliquis BID  INTERVAL HISTORY:  [name], a 70 y.o. female, returns for routine follow-up for her hypercoagulable state. Denis was last seen on 02/13/2021.  Today she reports feeling well. She is taking flecainide and tolerating it well.   REVIEW OF SYSTEMS:  Review of Systems  Constitutional:  Positive for fatigue (40%). Negative for appetite change.  Cardiovascular:  Positive for palpitations (A-fib).  All other systems reviewed and are negative.  PAST MEDICAL/SURGICAL HISTORY:  Past Medical History:  Diagnosis Date   Atrial fibrillation ([facility])    Current use of estrogen therapy 01/31/2013   Dysrhythmia    AFib   Hypertension    Macular degeneration    Menopausal syndrome    Right ovarian cyst 01/25/2014   Past Surgical History:  Procedure Laterality Date   ABDOMINAL HYSTERECTOMY     APPENDECTOMY     CARDIOVERSION N/A 09/14/2016   Procedure: CARDIOVERSION;  Surgeon: [name], MD;  Location: [facility];  Service: Cardiovascular;  Laterality: N/A;   CARDIOVERSION N/A 02/09/2021   Procedure: CARDIOVERSION;  Surgeon: [name], MD;  Location: [facility] ENDOSCOPY;  Service: Cardiovascular;  Laterality: N/A;   CATARACT EXTRACTION W/ INTRAOCULAR LENS  IMPLANT, BILATERAL Bilateral    COLONOSCOPY  12/04/03   Friable anal canal otherwise normal rectum and colon   COLONOSCOPY N/A 03/19/2013   Dr. [name]: diverticulosis, hemorrhoids. next TCS 10 years   COLONOSCOPY N/A 05/02/2019   Procedure: COLONOSCOPY;  Surgeon: [name], MD;  [facility];  Service: Endoscopy;  Laterality: N/A;  7:30am   ESOPHAGOGASTRODUODENOSCOPY N/A 10/17/2018   Procedure:  ESOPHAGOGASTRODUODENOSCOPY (EGD);  Surgeon: [name], MD;  [facility];  Service: Endoscopy;  Laterality: N/A;  3:00pm   IR CT HEAD LTD  01/05/2021   IR CT HEAD LTD  01/05/2021   IR INTRA CRAN STENT  01/05/2021   IR PERCUTANEOUS ART THROMBECTOMY/INFUSION INTRACRANIAL INC DIAG ANGIO  01/05/2021   IR US GUIDE VASC ACCESS LEFT  01/05/2021   MALONEY DILATION N/A 10/17/2018   Procedure: Elease Hashimoto DILATION;  Surgeon: [name], MD;  [facility];  Service: Endoscopy;  Laterality: N/A;   RADIOLOGY WITH ANESTHESIA N/A 01/05/2021   Procedure: IR WITH ANESTHESIA;  Surgeon: Radiologist, Medication, MD;  Location: [facility] OR;  Service: Radiology;  Laterality: N/A;   TEE WITHOUT CARDIOVERSION N/A 09/14/2016   Procedure: TRANSESOPHAGEAL ECHOCARDIOGRAM (TEE) WITH PROPOFOL;  Surgeon: [name], MD;  Location: [facility];  Service: Cardiovascular;  Laterality: N/A;   TONSILECTOMY, ADENOIDECTOMY, BILATERAL MYRINGOTOMY AND TUBES      SOCIAL HISTORY:  Social History   Socioeconomic History   Marital status: Married    Spouse name: Not on file   Number of children: Not on file   Years of education: Not on file   Highest education level: Not on file  Occupational History   Occupation: Nurse    Comment: [employer]  Tobacco Use   Smoking status: Never   Smokeless tobacco: Never   Tobacco comments:    Never smoked  Vaping Use   Vaping Use: Never used  Substance and Sexual Activity   Alcohol use: Not Currently  Alcohol/week: 1.0 standard drink    Types: 1 Glasses of wine per week    Comment: occ wine   Drug use: No   Sexual activity: Yes    Birth control/protection: Surgical    Comment: hyst  Other Topics Concern   Not on file  Social History Narrative   Not on file   Social Determinants of Health   Financial Resource Strain: Low Risk    Difficulty of Paying Living Expenses: Not hard at all  Food Insecurity: No Food Insecurity   Worried About Sales executive in the Last Year: Never true   Ran Out of Food in the Last Year: Never true  Transportation Needs: No Transportation Needs   Lack of Transportation (Medical): No   Lack of Transportation (Non-Medical): No  Physical Activity: Insufficiently Active   Days of Exercise per Week: 1 day   Minutes of Exercise per Session: 30 min  Stress: Stress Concern Present   Feeling of Stress : To some extent  Social Connections: Socially Integrated   Frequency of Communication with Friends and Family: Three times a week   Frequency of Social Gatherings with Friends and Family: Twice a week   Attends Religious Services: More than 4 times per year   Active Member of [employer] or Organizations: Yes   Attends Music therapist: More than 4 times per year   Marital Status: Married  Human resources officer Violence: Not At Risk   Fear of Current or Ex-Partner: No   Emotionally Abused: No   Physically Abused: No   Sexually Abused: No    FAMILY HISTORY:  Family History  Problem Relation Age of Onset   Coronary artery disease Father    Cancer Father        bladder cancer   Arrhythmia Sister        Reportedly had ablation of SVT   Hypertension Sister    Colon cancer Sister 7       s/p surgical resection, no chemo/radiation   Colon cancer Maternal Grandmother    Hypertension Son     CURRENT MEDICATIONS:  Current Outpatient Medications  Medication Sig Dispense Refill   acetaminophen (TYLENOL) 500 MG tablet Take 1,000 mg by mouth every 6 (six) hours as needed for moderate pain or headache.     aspirin 81 MG EC tablet Take 1 tablet (81 mg total) by mouth daily. 150 tablet 2   Calcium Carb-Cholecalciferol (CALCIUM 600 + D PO) Take 1 tablet by mouth every evening.     Cholecalciferol (DIALYVITE VITAMIN D 5000) 125 MCG (5000 UT) capsule Take 5,000 Units by mouth every evening.     diltiazem (CARDIZEM) 60 MG tablet Take 60 mg by mouth daily as needed (afib).     diltiazem (CARTIA XT) 180 MG 24 hr  capsule Take 1 capsule (180 mg total) by mouth daily. 180 capsule 3   ELIQUIS 5 MG TABS tablet TAKE ONE TABLET (5MG  TOTAL) BY MOUTH TWOTIMES DAILY 180 tablet 3   escitalopram (LEXAPRO) 5 MG tablet Take 5 mg by mouth in the morning.     flecainide (TAMBOCOR) 100 MG tablet Take 1 tablet (100 mg total) by mouth 2 (two) times daily. 180 tablet 3   levothyroxine (SYNTHROID) 50 MCG tablet Take 50 mcg by mouth daily at 2 am.     losartan (COZAAR) 25 MG tablet Take 25 mg by mouth every evening.     magnesium oxide (MAG-OX) 400 MG tablet Take 400 mg by mouth  at bedtime.     metoprolol tartrate (LOPRESSOR) 25 MG tablet Take 1 tablet by mouth every 8 hours as needed for breakthrough afib (Patient taking differently: Take 12.5 mg by mouth every 8 (eight) hours as needed (breakthrough afib).) 60 tablet 3   Multiple Vitamins-Minerals (MULTIVITAMIN WITH MINERALS) tablet Take 1 tablet by mouth in the morning. Women's One A Day     Multiple Vitamins-Minerals (PRESERVISION AREDS 2) CAPS Take 1 capsule by mouth 2 (two) times daily.     Omega-3 Fatty Acids (FISH OIL ULTRA) 1400 MG CAPS Take 1,400 mg by mouth in the morning.     pantoprazole (PROTONIX) 40 MG tablet Take 1 tablet (40 mg total) by mouth daily before breakfast. 90 tablet 0   rosuvastatin (CRESTOR) 20 MG tablet Take 20 mg by mouth in the morning.     No current facility-administered medications for this visit.    ALLERGIES:  No Known Allergies  PHYSICAL EXAM:  Performance status (ECOG): 0 - Asymptomatic  There were no vitals filed for this visit. Wt Readings from Last 3 Encounters:  02/17/21 193 lb 3.2 oz (87.6 kg)  02/13/21 192 lb (87.1 kg)  02/09/21 196 lb 6.9 oz (89.1 kg)   Physical Exam Vitals reviewed.  Constitutional:      Appearance: Normal appearance. She is obese.  Cardiovascular:     Rate and Rhythm: Normal rate and regular rhythm.     Pulses: Normal pulses.     Heart sounds: Normal heart sounds.  Pulmonary:     Effort:  Pulmonary effort is normal.     Breath sounds: Normal breath sounds.  Neurological:     General: No focal deficit present.     Mental Status: She is alert and oriented to person, place, and time.  Psychiatric:        Mood and Affect: Mood normal.        Behavior: Behavior normal.    LABORATORY DATA:  I have reviewed the labs as listed.  CBC Latest Ref Rng & Units 02/17/2021 01/28/2021 01/05/2021  WBC 4.0 - 10.5 K/uL 8.6 8.7 -  Hemoglobin 12.0 - 15.0 g/dL 15.1(H) 14.6 13.9  Hematocrit 36.0 - 46.0 % 45.5 45.0 41.0  Platelets 150 - 400 K/uL 255 254 -   CMP Latest Ref Rng & Units 02/17/2021 01/28/2021 01/05/2021  Glucose 70 - 99 mg/dL 98 100(H) 92  BUN 8 - 23 mg/dL 21 14 16   Creatinine 0.44 - 1.00 mg/dL 0.95 0.98 0.90  Sodium 135 - 145 mmol/L 137 139 142  Potassium 3.5 - 5.1 mmol/L 4.3 4.4 4.4  Chloride 98 - 111 mmol/L 105 108 106  CO2 22 - 32 mmol/L 27 25 -  Calcium 8.9 - 10.3 mg/dL 9.7 9.6 -  Total Protein 6.5 - 8.1 g/dL - - -  Total Bilirubin 0.3 - 1.2 mg/dL - - -  Alkaline Phos 38 - 126 U/L - - -  AST 15 - 41 U/L - - -  ALT 0 - 44 U/L - - -      Component Value Date/Time   RBC 4.91 02/17/2021 1017   MCV 92.7 02/17/2021 1017   MCH 30.8 02/17/2021 1017   MCHC 33.2 02/17/2021 1017   RDW 12.7 02/17/2021 1017   LYMPHSABS 2.7 01/05/2021 1206   MONOABS 0.5 01/05/2021 1206   EOSABS 0.1 01/05/2021 1206   BASOSABS 0.1 01/05/2021 1206    DIAGNOSTIC IMAGING:  I have independently reviewed the scans and discussed with the patient. No results  found.   ASSESSMENT:  Hypercoagulable state: - She has been on Eliquis since 2018 for atrial fibrillation.  She underwent cardioversion in 2018 and on 02/09/2021. - She presented with left-sided weakness on 01/05/2021.  She apparently delayed taking Eliquis for about 3 hours on that particular day.  She never missed any doses of Eliquis. - MRI brain on 01/05/2021 showed multiple small foci of acute/early subacute ischemia within both  hemispheres with largest lesion in the right basal ganglia and thalamus.  Old left cerebellar infarct and mild findings of chronic small vessel disease. - She underwent successful mechanical thrombectomy for right PCA occlusion followed by stenting and angioplasty for treatment of a right P1/PCA occlusion with underlying proximal P2 segment stenosis.  She was started to have right BG and thalamus acute infarcts embolic secondary to atrial fibrillation. - She denies any bleeding issues while on Eliquis.  Her left-sided weakness has completely recovered. - No prior history of DVT or pulmonary embolism.  No prior history of miscarriages.  She had 2 normal pregnancies.   Social/family history: - She lives at home with her husband.  She is a retired Therapist, sports and worked in Designer, television/film set.  She is a non-smoker. - Sister had 1 miscarriage.  No family history of lupus anticoagulant.  Sister had colon cancer at age 79.  Father had bladder cancer at age 28.  Several maternal aunts had breast cancer.   PLAN:  Embolic stroke while on Eliquis: - We have reviewed blood work from 02/13/2021. - Lupus anticoagulant test was positive although it could be false positive from Eliquis. - Anticardiolipin and antibeta-2 glycoprotein 1 antibodies were negative. - Factor V Leiden and prothrombin gene mutation testing was normal.  Protein C and protein S were normal.  Antithrombin III was normal.  D-dimer was negative. - She is having ablation for atrial flutter on 03/18/2021.  She is also on flecainide. - She will continue aspirin 81 mg daily along with Eliquis at this time. - RTC 4 months with repeat lupus anticoagulant.  Ideally we should check lupus anticoagulant 1 the patient is off of Eliquis for at least 3 to 4 weeks.  However it is not feasible for her to be off anticoagulation for that amount of time due to increased risk of stroke.  We will repeat lupus anticoagulant and it will help if it is negative.  Orders  placed this encounter:  No orders of the defined types were placed in this encounter.    Derek Jack, MD [place] [phone]   I, Thana Ates, am acting as a scribe for Dr. Derek Jack.  I, Derek Jack MD, have reviewed the above documentation for accuracy and completeness, and I agree with the above.

## 2021-02-23 ENCOUNTER — Other Ambulatory Visit: Payer: Self-pay

## 2021-02-23 ENCOUNTER — Inpatient Hospital Stay (HOSPITAL_COMMUNITY): Payer: Medicare Other | Admitting: Hematology

## 2021-02-23 VITALS — BP 109/67 | HR 45 | Temp 97.4°F | Resp 16 | Wt 196.0 lb

## 2021-02-23 DIAGNOSIS — D6869 Other thrombophilia: Secondary | ICD-10-CM

## 2021-02-23 NOTE — Patient Instructions (Signed)
[facility] Discharge Instructions  You were seen and examined today by Dr. Ellin Saba. Remain on Eliquis. Your lab work was good, no hypercoagulable states! Dr. Ellin Saba will recheck one lab in 4 months because it was positive but one positive test alone is no sufficient to be considered truly positive.   Thank you for choosing  [facility] Center at [facility] to provide your oncology and hematology care.  To afford each patient quality time with our provider, please arrive at least 15 minutes before your scheduled appointment time.   If you have a lab appointment with the Cancer Center please come in thru the Main Entrance and check in at the main information desk.  You need to re-schedule your appointment should you arrive 10 or more minutes late.  We strive to give you quality time with our providers, and arriving late affects you and other patients whose appointments are after yours.  Also, if you no show three or more times for appointments you may be dismissed from the clinic at the providers discretion.     Again, thank you for choosing [facility].  Our hope is that these requests will decrease the amount of time that you wait before being seen by our physicians.       _____________________________________________________________  Should you have questions after your visit to [facility], please contact our office at [phone] and follow the prompts.  Our office hours are 8:00 a.m. and 4:30 p.m. Monday - Friday.  Please note that voicemails left after 4:00 p.m. may not be returned until the following business day.  We are closed weekends and major holidays.  You do have access to a nurse 24-7, just call the main number to the clinic [phone] and do not press any options, hold on the line and a nurse will answer the phone.    For prescription refill requests, have your pharmacy contact our office and  allow 72 hours.    Due to Covid, you will need to wear a mask upon entering the hospital. If you do not have a mask, a mask will be given to you at the Main Entrance upon arrival. For doctor visits, patients may have 1 support person age 81 or older with them. For treatment visits, patients can not have anyone with them due to social distancing guidelines and our immunocompromised population.

## 2021-03-11 ENCOUNTER — Other Ambulatory Visit: Payer: Self-pay | Admitting: Internal Medicine

## 2021-03-12 NOTE — Telephone Encounter (Signed)
This is a [place] pt.  °

## 2021-03-17 NOTE — Anesthesia Preprocedure Evaluation (Addendum)
Anesthesia Evaluation  Patient identified by MRN, date of birth, ID band Patient awake    Reviewed: Allergy & Precautions, NPO status , Patient's Chart, lab work & pertinent test results  Airway Mallampati: II  TM Distance: >3 FB     Dental   Pulmonary neg pulmonary ROS,    breath sounds clear to auscultation       Cardiovascular hypertension, + dysrhythmias Atrial Fibrillation  Rhythm:Regular Rate:Normal     Neuro/Psych CVA    GI/Hepatic negative GI ROS, Neg liver ROS,   Endo/Other  negative endocrine ROS  Renal/GU negative Renal ROS     Musculoskeletal   Abdominal   Peds  Hematology   Anesthesia Other Findings   Reproductive/Obstetrics                            Anesthesia Physical Anesthesia Plan  ASA: 3  Anesthesia Plan: General   Post-op Pain Management:    Induction:   PONV Risk Score and Plan: Ondansetron and Dexamethasone  Airway Management Planned: Oral ETT  Additional Equipment:   Intra-op Plan:   Post-operative Plan: Extubation in OR  Informed Consent: I have reviewed the patients History and Physical, chart, labs and discussed the procedure including the risks, benefits and alternatives for the proposed anesthesia with the patient or authorized representative who has indicated his/her understanding and acceptance.     Dental advisory given  Plan Discussed with: Anesthesiologist and CRNA  Anesthesia Plan Comments:        Anesthesia Quick Evaluation

## 2021-03-17 NOTE — Pre-Procedure Instructions (Signed)
Instructed patient on the following items: Arrival time 0630 Nothing to eat or drink after midnight No meds AM of procedure Responsible person to drive you home and stay with you for 24 hrs  Have you missed any doses of anti-coagulant Eliquis- hasn't missed any doses.   

## 2021-03-18 ENCOUNTER — Ambulatory Visit (HOSPITAL_COMMUNITY): Payer: Medicare Other | Admitting: Anesthesiology

## 2021-03-18 ENCOUNTER — Ambulatory Visit (HOSPITAL_COMMUNITY)
Admission: RE | Admit: 2021-03-18 | Discharge: 2021-03-19 | Disposition: A | Discharge status: Home / Self Care | Payer: Medicare Other | Attending: Internal Medicine | Admitting: Internal Medicine

## 2021-03-18 ENCOUNTER — Encounter (HOSPITAL_COMMUNITY)
Admission: RE | Disposition: A | Discharge status: Home / Self Care | Payer: Medicare Other | Source: Home / Self Care | Attending: Internal Medicine

## 2021-03-18 ENCOUNTER — Encounter (HOSPITAL_COMMUNITY): Payer: Self-pay | Admitting: Internal Medicine

## 2021-03-18 DIAGNOSIS — Z7901 Long term (current) use of anticoagulants: Secondary | ICD-10-CM | POA: Insufficient documentation

## 2021-03-18 DIAGNOSIS — I483 Typical atrial flutter: Secondary | ICD-10-CM | POA: Insufficient documentation

## 2021-03-18 DIAGNOSIS — I480 Paroxysmal atrial fibrillation: Secondary | ICD-10-CM | POA: Diagnosis not present

## 2021-03-18 DIAGNOSIS — Z8673 Personal history of transient ischemic attack (TIA), and cerebral infarction without residual deficits: Secondary | ICD-10-CM | POA: Insufficient documentation

## 2021-03-18 DIAGNOSIS — I10 Essential (primary) hypertension: Secondary | ICD-10-CM | POA: Diagnosis not present

## 2021-03-18 DIAGNOSIS — I4892 Unspecified atrial flutter: Secondary | ICD-10-CM | POA: Diagnosis present

## 2021-03-18 HISTORY — PX: A-FLUTTER ABLATION: EP1230

## 2021-03-18 LAB — POCT ACTIVATED CLOTTING TIME
Activated Clotting Time: 231 seconds
Activated Clotting Time: 318 seconds
Activated Clotting Time: 144 seconds

## 2021-03-18 SURGERY — A-FLUTTER ABLATION
Anesthesia: General

## 2021-03-18 MED ORDER — SODIUM CHLORIDE 0.9% FLUSH
3.0000 mL | Freq: Two times a day (BID) | INTRAVENOUS | Status: DC
  Administered 2021-03-18 (×2): 3 mL via INTRAVENOUS

## 2021-03-18 MED ORDER — FISH OIL ULTRA 1400 MG PO CAPS: 1400.0000 mg | ORAL_CAPSULE | Freq: Every morning | ORAL | Status: DC

## 2021-03-18 MED ORDER — ACETAMINOPHEN 325 MG PO TABS: 650.0000 mg | ORAL_TABLET | ORAL | Status: DC | PRN

## 2021-03-18 MED ORDER — PROPOFOL 10 MG/ML IV BOLUS
INTRAVENOUS | Status: DC | PRN
  Administered 2021-03-18: 150 mg via INTRAVENOUS

## 2021-03-18 MED ORDER — ONDANSETRON HCL 4 MG/2ML IJ SOLN
INTRAMUSCULAR | Status: DC | PRN
  Administered 2021-03-18: 4 mg via INTRAVENOUS

## 2021-03-18 MED ORDER — ADULT MULTIVITAMIN W/MINERALS CH
1.0000 | ORAL_TABLET | Freq: Every morning | ORAL | Status: DC
  Administered 2021-03-18 – 2021-03-19 (×2): 1 via ORAL
  Filled 2021-03-18 (×2): qty 1

## 2021-03-18 MED ORDER — SODIUM CHLORIDE 0.9 % IV SOLN: 250.0000 mL | INTRAVENOUS | Status: DC | PRN

## 2021-03-18 MED ORDER — FENTANYL CITRATE (PF) 100 MCG/2ML IJ SOLN
INTRAMUSCULAR | Status: DC | PRN
  Administered 2021-03-18: 100 ug via INTRAVENOUS

## 2021-03-18 MED ORDER — PHENYLEPHRINE 40 MCG/ML (10ML) SYRINGE FOR IV PUSH (FOR BLOOD PRESSURE SUPPORT)
PREFILLED_SYRINGE | INTRAVENOUS | Status: DC | PRN
  Administered 2021-03-18: 80 ug via INTRAVENOUS
  Administered 2021-03-18: 40 ug via INTRAVENOUS

## 2021-03-18 MED ORDER — ACETAMINOPHEN 500 MG PO TABS
1000.0000 mg | ORAL_TABLET | Freq: Four times a day (QID) | ORAL | Status: DC | PRN
  Administered 2021-03-19: 1000 mg via ORAL
  Filled 2021-03-18: qty 2

## 2021-03-18 MED ORDER — MAGNESIUM OXIDE -MG SUPPLEMENT 400 (240 MG) MG PO TABS
400.0000 mg | ORAL_TABLET | Freq: Every day | ORAL | Status: DC
  Administered 2021-03-18: 400 mg via ORAL
  Filled 2021-03-18: qty 1

## 2021-03-18 MED ORDER — PRESERVISION AREDS 2 PO CAPS: 1.0000 | ORAL_CAPSULE | Freq: Two times a day (BID) | ORAL | Status: DC

## 2021-03-18 MED ORDER — EPHEDRINE SULFATE-NACL 50-0.9 MG/10ML-% IV SOSY
PREFILLED_SYRINGE | INTRAVENOUS | Status: DC | PRN
  Administered 2021-03-18: 5 mg via INTRAVENOUS

## 2021-03-18 MED ORDER — PROTAMINE SULFATE 10 MG/ML IV SOLN
INTRAVENOUS | Status: DC | PRN
  Administered 2021-03-18: 30 mg via INTRAVENOUS

## 2021-03-18 MED ORDER — HEPARIN (PORCINE) IN NACL 2000-0.9 UNIT/L-% IV SOLN
INTRAVENOUS | Status: AC
  Filled 2021-03-18: qty 1000

## 2021-03-18 MED ORDER — PROSIGHT PO TABS
1.0000 | ORAL_TABLET | Freq: Two times a day (BID) | ORAL | Status: DC
  Administered 2021-03-18 – 2021-03-19 (×3): 1 via ORAL
  Filled 2021-03-18 (×3): qty 1

## 2021-03-18 MED ORDER — APIXABAN 5 MG PO TABS
5.0000 mg | ORAL_TABLET | Freq: Two times a day (BID) | ORAL | Status: DC
  Administered 2021-03-18 – 2021-03-19 (×2): 5 mg via ORAL
  Filled 2021-03-18 (×2): qty 1

## 2021-03-18 MED ORDER — MIDAZOLAM HCL 5 MG/5ML IJ SOLN
INTRAMUSCULAR | Status: DC | PRN
Start: 2021-03-18 — End: 2021-03-18
  Administered 2021-03-18: 2 mg via INTRAVENOUS

## 2021-03-18 MED ORDER — ROCURONIUM BROMIDE 10 MG/ML (PF) SYRINGE
PREFILLED_SYRINGE | INTRAVENOUS | Status: DC | PRN
  Administered 2021-03-18: 60 mg via INTRAVENOUS

## 2021-03-18 MED ORDER — ROSUVASTATIN CALCIUM 20 MG PO TABS
20.0000 mg | ORAL_TABLET | Freq: Every morning | ORAL | Status: DC
  Administered 2021-03-18 – 2021-03-19 (×2): 20 mg via ORAL
  Filled 2021-03-18 (×2): qty 1

## 2021-03-18 MED ORDER — SODIUM CHLORIDE 0.9% FLUSH: 3.0000 mL | INTRAVENOUS | Status: DC | PRN

## 2021-03-18 MED ORDER — ONDANSETRON HCL 4 MG/2ML IJ SOLN: 4.0000 mg | Freq: Four times a day (QID) | INTRAMUSCULAR | Status: DC | PRN

## 2021-03-18 MED ORDER — LIDOCAINE 2% (20 MG/ML) 5 ML SYRINGE
INTRAMUSCULAR | Status: DC | PRN
  Administered 2021-03-18: 100 mg via INTRAVENOUS

## 2021-03-18 MED ORDER — HEPARIN SODIUM (PORCINE) 1000 UNIT/ML IJ SOLN
INTRAMUSCULAR | Status: DC | PRN
  Administered 2021-03-18: 8000 [IU] via INTRAVENOUS
  Administered 2021-03-18: 6000 [IU] via INTRAVENOUS
  Administered 2021-03-18: 2000 [IU] via INTRAVENOUS

## 2021-03-18 MED ORDER — HEPARIN SODIUM (PORCINE) 1000 UNIT/ML IJ SOLN
INTRAMUSCULAR | Status: AC
  Filled 2021-03-18: qty 10

## 2021-03-18 MED ORDER — OMEGA-3-ACID ETHYL ESTERS 1 G PO CAPS
1.0000 g | ORAL_CAPSULE | Freq: Every day | ORAL | Status: DC
  Administered 2021-03-19: 1 g via ORAL
  Filled 2021-03-18: qty 1

## 2021-03-18 MED ORDER — SODIUM CHLORIDE 0.9 % IV SOLN: INTRAVENOUS | Status: DC

## 2021-03-18 MED ORDER — HEPARIN (PORCINE) IN NACL 2000-0.9 UNIT/L-% IV SOLN
INTRAVENOUS | Status: DC | PRN
  Administered 2021-03-18: 1000 mL

## 2021-03-18 MED ORDER — SUGAMMADEX SODIUM 200 MG/2ML IV SOLN
INTRAVENOUS | Status: DC | PRN
  Administered 2021-03-18: 200 mg via INTRAVENOUS

## 2021-03-18 MED ORDER — ESCITALOPRAM OXALATE 10 MG PO TABS
10.0000 mg | ORAL_TABLET | Freq: Every day | ORAL | Status: DC
  Administered 2021-03-18 – 2021-03-19 (×2): 10 mg via ORAL
  Filled 2021-03-18 (×2): qty 1

## 2021-03-18 MED ORDER — DEXAMETHASONE SODIUM PHOSPHATE 10 MG/ML IJ SOLN
INTRAMUSCULAR | Status: DC | PRN
  Administered 2021-03-18: 4 mg via INTRAVENOUS

## 2021-03-18 MED ORDER — FLECAINIDE ACETATE 100 MG PO TABS
100.0000 mg | ORAL_TABLET | Freq: Two times a day (BID) | ORAL | Status: DC
  Administered 2021-03-18 – 2021-03-19 (×3): 100 mg via ORAL
  Filled 2021-03-18 (×3): qty 1

## 2021-03-18 MED ORDER — ASPIRIN EC 81 MG PO TBEC
81.0000 mg | DELAYED_RELEASE_TABLET | Freq: Every day | ORAL | Status: DC
  Administered 2021-03-18: 81 mg via ORAL
  Filled 2021-03-18 (×2): qty 1

## 2021-03-18 MED ORDER — LOSARTAN POTASSIUM 25 MG PO TABS
25.0000 mg | ORAL_TABLET | Freq: Every evening | ORAL | Status: DC
  Administered 2021-03-18: 25 mg via ORAL
  Filled 2021-03-18: qty 1

## 2021-03-18 MED ORDER — LEVOTHYROXINE SODIUM 50 MCG PO TABS
50.0000 ug | ORAL_TABLET | Freq: Every day | ORAL | Status: DC
  Administered 2021-03-19: 50 ug via ORAL
  Filled 2021-03-18: qty 1

## 2021-03-18 MED ORDER — ACETAMINOPHEN 500 MG PO TABS: 1000.0000 mg | ORAL_TABLET | Freq: Four times a day (QID) | ORAL | Status: DC | PRN

## 2021-03-18 MED ORDER — PANTOPRAZOLE SODIUM 40 MG PO TBEC
40.0000 mg | DELAYED_RELEASE_TABLET | Freq: Every day | ORAL | Status: DC
  Administered 2021-03-18 – 2021-03-19 (×2): 40 mg via ORAL
  Filled 2021-03-18 (×2): qty 1

## 2021-03-18 MED ORDER — PHENYLEPHRINE HCL-NACL 20-0.9 MG/250ML-% IV SOLN
INTRAVENOUS | Status: DC | PRN
  Administered 2021-03-18: 25 ug/min via INTRAVENOUS

## 2021-03-18 SURGICAL SUPPLY — 14 items
CATH DUODECA HALO/ISMUS 7FR (CATHETERS) ×1 IMPLANT
CATH JOSEPH QUAD ALLRED 6F REP (CATHETERS) ×1 IMPLANT
CATH SMTCH THERMOCOOL SF FJ (CATHETERS) ×1 IMPLANT
CATH WEBSTER BI DIR CS D-F CRV (CATHETERS) ×1 IMPLANT
PACK EP LATEX FREE (CUSTOM PROCEDURE TRAY) ×2
PACK EP LF (CUSTOM PROCEDURE TRAY) ×1 IMPLANT
PAD DEFIB RADIO PHYSIO CONN (PAD) ×2 IMPLANT
PATCH CARTO3 (PAD) ×1 IMPLANT
PROTECTION STATION PRESSURIZED (MISCELLANEOUS) ×2
SHEATH PINNACLE 6F 10CM (SHEATH) ×1 IMPLANT
SHEATH PINNACLE 7F 10CM (SHEATH) ×1 IMPLANT
SHEATH PINNACLE 8F 10CM (SHEATH) ×2 IMPLANT
STATION PROTECTION PRESSURIZED (MISCELLANEOUS) IMPLANT
TUBING SMART ABLATE COOLFLOW (TUBING) ×1 IMPLANT

## 2021-03-18 NOTE — Anesthesia Procedure Notes (Addendum)
Procedure Name: Intubation Date/Time: 03/18/2021 8:37 AM Performed by: Wilburn Cornelia, CRNA Pre-anesthesia Checklist: Patient identified, Emergency Drugs available, Suction available, Patient being monitored and Timeout performed Patient Re-evaluated:Patient Re-evaluated prior to induction Oxygen Delivery Method: Circle system utilized Preoxygenation: Pre-oxygenation with 100% oxygen Induction Type: IV induction and Cricoid Pressure applied Ventilation: Mask ventilation without difficulty Laryngoscope Size: Mac and 3 Grade View: Grade IV Tube type: Oral Tube size: 7.0 mm Number of attempts: 1 Airway Equipment and Method: Stylet Placement Confirmation: ETT inserted through vocal cords under direct vision, positive ETCO2, CO2 detector and breath sounds checked- equal and bilateral Secured at: 21 cm Tube secured with: Tape Dental Injury: Teeth and Oropharynx as per pre-operative assessment

## 2021-03-18 NOTE — Transfer of Care (Signed)
Immediate Anesthesia Transfer of Care Note  Patient: Renee Benitez  Procedure(s) Performed: A-FLUTTER ABLATION  Patient Location: Cath Lab  Anesthesia Type:General  Level of Consciousness: awake and alert   Airway & Oxygen Therapy: Patient Spontanous Breathing and Patient connected to nasal cannula oxygen  Post-op Assessment: Report given to RN and Post -op Vital signs reviewed and stable  Post vital signs: Reviewed and stable  Last Vitals:  Vitals Value Taken Time  BP 150/73 03/18/21 1238  Temp    Pulse 65 03/18/21 1239  Resp 15 03/18/21 1239  SpO2 98 % 03/18/21 1239  Vitals shown include unvalidated device data.  Last Pain:  Vitals:   03/18/21 0702  TempSrc:   PainSc: 0-No pain         Complications: No notable events documented.

## 2021-03-18 NOTE — Discharge Instructions (Signed)
Post procedure care instructions No driving for 4 days. No lifting over 5 lbs for 1 week. No vigorous or sexual activity for 1 week. You may return to work/your usual activities on 02/24/21. Keep procedure site clean & dry. If you notice increased pain, swelling, bleeding or pus, call/return!  You may shower after 24 hours, but no soaking in baths/hot tubs/pools for 1 week.   

## 2021-03-18 NOTE — Discharge Summary (Addendum)
ELECTROPHYSIOLOGY PROCEDURE DISCHARGE SUMMARY    Patient ID: Renee Benitez,  MRN: [id number], DOB/AGE: September 14, 1950 70 y.o.  Admit date: 03/18/2021 Discharge date: 03/19/21  Primary Care Physician: Aliene Beams, MD  Primary Cardiologist/Electrophysiologist: Dr. Ladona Ridgel  Primary Discharge Diagnosis:  Paroxysmal AFib Atrial flutter (atypical) CHA2DS2Vasc is 5, on Eliquis   Secondary Discharge Diagnosis:  HTN Stroke (old)  No Known Allergies   Procedures This Admission: 1.  Electrophysiology study and radiofrequency catheter ablation on 03/18/21 by Dr Ladona Ridgel.    Conclusion: Successful EP study and catheter ablation of both left atrial and right atrial flutter resulting in the creation of sinus rhythm with no inducible flutter.    Brief HPI: Renee Benitez is a 70 y.o. female with a past medical history as outlined above.  She has documented atrial flutter. She has been appropriately anticoagulated for >4 weeks.  Risks, benefits, and alternatives to ablation were reviewed with the patient who wished to proceed.   Hospital Course:  The patient was admitted and underwent EPS/RFCA with details as outlined above. She was monitored on telemetry overnight which demonstrated SR/sinus arrhythmia.  R groin iswithout complication.  They were examined by Dr Ladona Ridgel who considered them stable for discharge to home.  Follow up will be arranged in 4 weeks.  Wound care and restrictions were reviewed with the patient prior to discharge.   Physical Exam: Vitals:   03/18/21 1922 03/18/21 2331 03/19/21 0307 03/19/21 0745  BP: 122/70 129/64 (!) 112/96 113/83  Pulse: 70 68 69 71  Resp: 19 16 18  (!) 23  Temp: 98.3 F (36.8 C) 98.4 F (36.9 C) 98.1 F (36.7 C) 97.7 F (36.5 C)  TempSrc: Oral Oral Oral Oral  SpO2: 96% 93% 97% 94%  Weight:      Height:        GEN- The patient is well appearing, alert and oriented x 3 today.   HEENT: normocephalic, atraumatic; sclera clear, conjunctiva  pink; hearing intact; oropharynx clear; neck supple, no JVP Lymph- no cervical lymphadenopathy Lungs-CTA b/l, normal work of breathing.  No wheezes, rales, rhonchi Heart- RRR, no murmurs, rubs or gallops, PMI not laterally displaced GI- soft, non-tender, non-distended, bowel sounds present, no hepatosplenomegaly Extremities- no clubbing, cyanosis, or edema; DP/PT/radial pulses 2+ bilaterally, R groin without hematoma/bruit MS- no significant deformity or atrophy Skin- warm and dry, no rash or lesion Psych- euthymic mood, full affect Neuro- strength and sensation are intact   Labs:   Lab Results  Component Value Date   WBC 8.6 02/17/2021   HGB 15.1 (H) 02/17/2021   HCT 45.5 02/17/2021   MCV 92.7 02/17/2021   PLT 255 02/17/2021   No results for input(s): NA, K, CL, CO2, BUN, CREATININE, CALCIUM, PROT, BILITOT, ALKPHOS, ALT, AST, GLUCOSE in the last 168 hours.  Invalid input(s): LABALBU  Discharge Medications:  Allergies as of 03/19/2021   No Known Allergies      Medication List     TAKE these medications    acetaminophen 500 MG tablet Commonly known as: TYLENOL Take 1,000 mg by mouth every 6 (six) hours as needed for moderate pain or headache.   Aspirin Low Dose 81 MG EC tablet Generic drug: aspirin Take 1 tablet (81 mg total) by mouth daily.   CALCIUM 600 + D PO Take 1 tablet by mouth every evening.   Dialyvite Vitamin D 5000 125 MCG (5000 UT) capsule Generic drug: Cholecalciferol Take 5,000 Units by mouth every evening.   diltiazem 180 MG  24 hr capsule Commonly known as: CARDIZEM CD TAKE 1 CAPSULE BY MOUTH  DAILY   diltiazem 60 MG tablet Commonly known as: CARDIZEM Take 60 mg by mouth daily as needed (afib).   Eliquis 5 MG Tabs tablet Generic drug: apixaban TAKE ONE TABLET (5MG  TOTAL) BY MOUTH TWOTIMES DAILY   escitalopram 10 MG tablet Commonly known as: LEXAPRO Take 10 mg by mouth daily.   Fish Oil Ultra 1400 MG Caps Take 1,400 mg by mouth in the  morning.   flecainide 100 MG tablet Commonly known as: TAMBOCOR Take 1 tablet (100 mg total) by mouth 2 (two) times daily.   levothyroxine 50 MCG tablet Commonly known as: SYNTHROID Take 50 mcg by mouth daily at 2 am.   losartan 25 MG tablet Commonly known as: COZAAR Take 25 mg by mouth every evening.   magnesium oxide 400 MG tablet Commonly known as: MAG-OX Take 400 mg by mouth at bedtime.   metoprolol tartrate 25 MG tablet Commonly known as: LOPRESSOR Take 1 tablet by mouth every 8 hours as needed for breakthrough afib What changed:  how much to take how to take this when to take this reasons to take this additional instructions   multivitamin with minerals tablet Take 1 tablet by mouth in the morning. Women's One A Day   PreserVision AREDS 2 Caps Take 1 capsule by mouth 2 (two) times daily.   pantoprazole 40 MG tablet Commonly known as: PROTONIX Take 1 tablet (40 mg total) by mouth daily before breakfast.   rosuvastatin 20 MG tablet Commonly known as: CRESTOR Take 20 mg by mouth in the morning.        Disposition: Home Discharge Instructions     Diet - low sodium heart healthy   Complete by: As directed    Increase activity slowly   Complete by: As directed         Duration of Discharge Encounter: Greater than 30 minutes including physician time.  Venetia Night, PA-C 03/19/2021 8:54 AM  EP Attending  Patient seen and examined. Agree with the findings as noted above. The patient is doing well after right and left atrial flutter ablation and maintaining NSR. She will be discharged home on flecainide and her beta blocker with usual followup.   Carleene Overlie Melissa Pulido,MD

## 2021-03-18 NOTE — Interval H&P Note (Signed)
History and Physical Interval Note:  03/18/2021 8:09 AM  Renee Benitez  has presented today for surgery, with the diagnosis of aflutter.  The various methods of treatment have been discussed with the patient and family. After consideration of risks, benefits and other options for treatment, the patient has consented to  Procedure(s): A-FLUTTER ABLATION (N/A) as a surgical intervention.  The patient's history has been reviewed, patient examined, no change in status, stable for surgery.  I have reviewed the patient's chart and labs.  Questions were answered to the patient's satisfaction.     Lewayne Bunting

## 2021-03-18 NOTE — Interval H&P Note (Signed)
History and Physical Interval Note:  03/18/2021 8:10 AM  Renee Benitez  has presented today for surgery, with the diagnosis of aflutter.  The various methods of treatment have been discussed with the patient and family. After consideration of risks, benefits and other options for treatment, the patient has consented to  Procedure(s): A-FLUTTER ABLATION (N/A) as a surgical intervention.  The patient's history has been reviewed, patient examined, no change in status, stable for surgery.  I have reviewed the patient's chart and labs.  Questions were answered to the patient's satisfaction.     Lewayne Bunting

## 2021-03-18 NOTE — Progress Notes (Signed)
Patient brought to 4E from cath lab. Telemetry applied, CCMD notified. VSS. Right groin site dressing clean, dry and intact. Patient oriented to room and staff. Call bell in reach.  Kenard Gower, RN

## 2021-03-18 NOTE — Anesthesia Postprocedure Evaluation (Signed)
Anesthesia Post Note  Patient: [name]  Procedure(s) Performed: A-FLUTTER ABLATION     Patient location during evaluation: Cath Lab Anesthesia Type: General Level of consciousness: awake Pain management: pain level controlled Vital Signs Assessment: post-procedure vital signs reviewed and stable Respiratory status: spontaneous breathing Cardiovascular status: stable Postop Assessment: no apparent nausea or vomiting Anesthetic complications: no   No notable events documented.  Last Vitals:  Vitals:   03/18/21 1305 03/18/21 1308  BP: 132/64 140/69  Pulse: 68 65  Resp: 18 14  Temp:  36.6 C  SpO2: 98% 98%    Last Pain:  Vitals:   03/18/21 1308  TempSrc: Temporal  PainSc:                  [name]

## 2021-03-18 NOTE — Progress Notes (Signed)
Site area: rt groin fv sheaths x3 Site Prior to Removal:  Level 0 Pressure Applied For: 20 minutes Manual:   yes Patient Status During Pull:  stable Post Pull Site:  Level 0 Post Pull Instructions Given:  yes Post Pull Pulses Present: rt pt palpable Dressing Applied:  gauze and tegaderm Bedrest begins @ 1315 Comments:

## 2021-03-19 ENCOUNTER — Encounter (HOSPITAL_COMMUNITY): Payer: Self-pay | Admitting: Internal Medicine

## 2021-03-19 DIAGNOSIS — I483 Typical atrial flutter: Secondary | ICD-10-CM | POA: Diagnosis not present

## 2021-03-19 DIAGNOSIS — Z8673 Personal history of transient ischemic attack (TIA), and cerebral infarction without residual deficits: Secondary | ICD-10-CM | POA: Diagnosis not present

## 2021-03-19 DIAGNOSIS — I10 Essential (primary) hypertension: Secondary | ICD-10-CM | POA: Diagnosis not present

## 2021-03-19 DIAGNOSIS — I480 Paroxysmal atrial fibrillation: Secondary | ICD-10-CM | POA: Diagnosis not present

## 2021-03-19 NOTE — Progress Notes (Signed)
Discharge: Summary and discharge instructions reviewed with patient.  CCMD notified.  IV access discontinued.  Assisted by staff to private vehicle.

## 2021-03-19 NOTE — Plan of Care (Signed)
  Problem: Education: Goal: Knowledge of General Education information will improve Description: Including pain rating scale, medication(s)/side effects and non-pharmacologic comfort measures Outcome: Adequate for Discharge   Problem: Health Behavior/Discharge Planning: Goal: Ability to manage health-related needs will improve Outcome: Adequate for Discharge   Problem: Clinical Measurements: Goal: Ability to maintain clinical measurements within normal limits will improve Outcome: Adequate for Discharge Goal: Will remain free from infection Outcome: Adequate for Discharge Goal: Diagnostic test results will improve Outcome: Adequate for Discharge Goal: Respiratory complications will improve Outcome: Adequate for Discharge Goal: Cardiovascular complication will be avoided Outcome: Adequate for Discharge   Problem: Elimination: Goal: Will not experience complications related to bowel motility Outcome: Adequate for Discharge Goal: Will not experience complications related to urinary retention Outcome: Adequate for Discharge   Problem: Nutrition: Goal: Adequate nutrition will be maintained Outcome: Adequate for Discharge   Problem: Activity: Goal: Risk for activity intolerance will decrease Outcome: Adequate for Discharge   Problem: Safety: Goal: Ability to remain free from injury will improve Outcome: Adequate for Discharge   Problem: Pain Managment: Goal: General experience of comfort will improve Outcome: Adequate for Discharge   Problem: Skin Integrity: Goal: Risk for impaired skin integrity will decrease Outcome: Adequate for Discharge

## 2021-03-25 ENCOUNTER — Ambulatory Visit: Payer: Medicare Other | Admitting: Neurology

## 2021-03-25 ENCOUNTER — Other Ambulatory Visit: Payer: Self-pay

## 2021-03-25 ENCOUNTER — Encounter: Payer: Self-pay | Admitting: Neurology

## 2021-03-25 VITALS — BP 125/77 | HR 111 | Ht 66.0 in | Wt 190.0 lb

## 2021-03-25 DIAGNOSIS — E7849 Other hyperlipidemia: Secondary | ICD-10-CM

## 2021-03-25 DIAGNOSIS — I63532 Cerebral infarction due to unspecified occlusion or stenosis of left posterior cerebral artery: Secondary | ICD-10-CM | POA: Diagnosis not present

## 2021-03-25 DIAGNOSIS — I4819 Other persistent atrial fibrillation: Secondary | ICD-10-CM

## 2021-03-25 NOTE — Progress Notes (Signed)
[facility] [address] [place]. [place] [address] [phone]       OFFICE FOLLOW-UP NOTE  [name] Date of Birth:  [birth year] Medical Record Number:  [id number]   HPI: Renee Benitez is a pleasant 70 year old Caucasian lady seen today for initial office follow-up visit following hospital admission for stroke and September 2022.  She is accompanied by her husband.  History is obtained from them and review of electronic medical records and I personally reviewed available pertinent imaging films in PACS.  She has past medical history of atrial fibrillation on long-term anticoagulation with Eliquis, hypertension, macular degeneration who presented on 01/05/2021 to [facility] with left-sided weakness.  She had gone to a bank and when leaving the bank noticed the symptoms.  She had a 20 point hospital and was evaluated by telemetry neurologist and found to have an extra-axial of 15.  CT angiogram demonstrated right posterior cerebral artery occlusion in the P1 segment.  Patient was on Eliquis and hence did not get thrombolysis but was transferred to [facility] for emergent mechanical thrombectomy which was successfully performed but patient required rescue angioplasty stenting of the right posterior cerebral artery by Dr. Norma Benitez.  Patient did well she was admitted to the ICU blood pressure is tightly controlled.  2D echo showed ejection fraction greater than 75% with moderate left ventricular hypertrophy and grade 2 diastolic dysfunction.  Left atrium size was mildly dilated.  LDL cholesterol was elevated at 131 mg percent hemoglobin A1c was 5.5.  MRI scan showed multiple small foci of acute infarcts in both hemispheres largest one being in the right basal ganglia and thalamus.  There is old left cerebellar infarct also noted with changes of small vessel disease.  Patient was discharged home and has done well.  She is finished therapies.  She has no therapy  needs.  She is back to doing all activities of daily living.  She does get tired a little bit but this is from her A. fib.  She has undergone cardioversion unsuccessfully for A. fib and recently underwent ablation by Dr. Lovena Benitez.  She is tolerating Eliquis well without bleeding and only minor bruising.  She is tolerating Crestor without muscle aches and pains.  Her blood pressure is under good control today it is 125/71.  She plans to have follow-up lab work done at upcoming primary care physician visit next month.  She also has an appointment to see Dr. Norma Benitez in a few months time.  She has no new complaints today.  Patient admits she may have missed 1 dose of Eliquis prior to developing a stroke symptoms.  ROS:   14 system review of systems is positive for weakness only, bruising all other systems negative  PMH:  Past Medical History:  Diagnosis Date   Atrial fibrillation (HCC)    Current use of estrogen therapy 01/31/2013   Dysrhythmia    AFib   Hypertension    Macular degeneration    Menopausal syndrome    Right ovarian cyst 01/25/2014    Social History:  Social History   Socioeconomic History   Marital status: Married    Spouse name: Not on file   Number of children: Not on file   Years of education: Not on file   Highest education level: Not on file  Occupational History   Occupation: Nurse    Comment: [employer]  Tobacco Use   Smoking status: Never   Smokeless tobacco: Never  Tobacco comments:    Never smoked  Vaping Use   Vaping Use: Never used  Substance and Sexual Activity   Alcohol use: Not Currently    Alcohol/week: 1.0 standard drink    Types: 1 Glasses of wine per week    Comment: occ wine   Drug use: No   Sexual activity: Yes    Birth control/protection: Surgical    Comment: hyst  Other Topics Concern   Not on file  Social History Narrative   Not on file   Social Determinants of Health   Financial Resource Strain: Low Risk    Difficulty of  Paying Living Expenses: Not hard at all  Food Insecurity: No Food Insecurity   Worried About Programme researcher, broadcasting/film/video in the Last Year: Never true   Ran Out of Food in the Last Year: Never true  Transportation Needs: No Transportation Needs   Lack of Transportation (Medical): No   Lack of Transportation (Non-Medical): No  Physical Activity: Insufficiently Active   Days of Exercise per Week: 1 day   Minutes of Exercise per Session: 30 min  Stress: Stress Concern Present   Feeling of Stress : To some extent  Social Connections: Socially Integrated   Frequency of Communication with Friends and Family: Three times a week   Frequency of Social Gatherings with Friends and Family: Twice a week   Attends Religious Services: More than 4 times per year   Active Member of [employer] or Organizations: Yes   Attends Engineer, structural: More than 4 times per year   Marital Status: Married  Catering manager Violence: Not At Risk   Fear of Current or Ex-Partner: No   Emotionally Abused: No   Physically Abused: No   Sexually Abused: No    Medications:   Current Outpatient Medications on File Prior to Visit  Medication Sig Dispense Refill   acetaminophen (TYLENOL) 500 MG tablet Take 1,000 mg by mouth every 6 (six) hours as needed for moderate pain or headache.     aspirin 81 MG EC tablet Take 1 tablet (81 mg total) by mouth daily. 150 tablet 2   Calcium Carb-Cholecalciferol (CALCIUM 600 + D PO) Take 1 tablet by mouth every evening.     Cholecalciferol (DIALYVITE VITAMIN D 5000) 125 MCG (5000 UT) capsule Take 5,000 Units by mouth every evening.     diltiazem (CARDIZEM CD) 180 MG 24 hr capsule TAKE 1 CAPSULE BY MOUTH  DAILY 90 capsule 2   diltiazem (CARDIZEM) 60 MG tablet Take 60 mg by mouth daily as needed (afib).     ELIQUIS 5 MG TABS tablet TAKE ONE TABLET (5MG  TOTAL) BY MOUTH TWOTIMES DAILY 180 tablet 3   escitalopram (LEXAPRO) 10 MG tablet Take 10 mg by mouth daily.     flecainide (TAMBOCOR)  100 MG tablet Take 1 tablet (100 mg total) by mouth 2 (two) times daily. 180 tablet 3   levothyroxine (SYNTHROID) 50 MCG tablet Take 50 mcg by mouth daily at 2 am.     losartan (COZAAR) 25 MG tablet Take 25 mg by mouth every evening.     magnesium oxide (MAG-OX) 400 MG tablet Take 400 mg by mouth at bedtime.     metoprolol tartrate (LOPRESSOR) 25 MG tablet Take 1 tablet by mouth every 8 hours as needed for breakthrough afib (Patient taking differently: Take 12.5 mg by mouth every 8 (eight) hours as needed (breakthrough afib).) 60 tablet 3   Multiple Vitamins-Minerals (MULTIVITAMIN WITH MINERALS) tablet Take  1 tablet by mouth in the morning. Women's One A Day     Multiple Vitamins-Minerals (PRESERVISION AREDS 2) CAPS Take 1 capsule by mouth 2 (two) times daily.     Omega-3 Fatty Acids (FISH OIL ULTRA) 1400 MG CAPS Take 1,400 mg by mouth in the morning.     pantoprazole (PROTONIX) 40 MG tablet Take 1 tablet (40 mg total) by mouth daily before breakfast. 90 tablet 0   rosuvastatin (CRESTOR) 20 MG tablet Take 20 mg by mouth in the morning.     No current facility-administered medications on file prior to visit.    Allergies:  No Known Allergies  Physical Exam General: Pleasant middle-aged Caucasian lady, seated, in no evident distress Head: head normocephalic and atraumatic.  Neck: supple with no carotid or supraclavicular bruits Cardiovascular: regular rate and rhythm, no murmurs Musculoskeletal: no deformity Skin:  no rash/petichiae Vascular:  Normal pulses all extremities Vitals:   03/25/21 0853  BP: 125/77  Pulse: (!) 111   Neurologic Exam Mental Status: Awake and fully alert. Oriented to place and time. Recent and remote memory intact. Attention span, concentration and fund of knowledge appropriate. Mood and affect appropriate.  Cranial Nerves: Fundoscopic exam reveals sharp disc margins. Pupils equal, briskly reactive to light. Extraocular movements full without nystagmus. Visual  fields full to confrontation. Hearing intact. Facial sensation intact. Face, tongue, palate moves normally and symmetrically.  Motor: Normal bulk and tone. Normal strength in all tested extremity muscles. Sensory.: intact to touch ,pinprick .position and vibratory sensation.  Coordination: Rapid alternating movements normal in all extremities. Finger-to-nose and heel-to-shin performed accurately bilaterally. Gait and Station: Arises from chair without difficulty. Stance is normal. Gait demonstrates normal stride length and balance . Able to heel, toe and tandem walk without difficulty.  Reflexes: 1+ and symmetric. Toes downgoing.   NIHSS  0 Modified Rankin  0   ASSESSMENT: 70 year old Caucasian lady with right basal ganglia and thalamic infarcts secondary to right PCA occlusion in September 2022 from atrial fibrillation treated with successful mechanical thrombectomy requiring rescue angioplasty and stenting of the right P1 PCA.  Vascular risk factors of atrial fibrillation, hypertension and hyperlipidemia.     PLAN:I had a long d/w patient and her husband about her  recent stroke,atrial fibrillation, risk for recurrent stroke/TIAs, personally independently reviewed imaging studies and stroke evaluation results and answered questions.Continue aspirin 81 mg daily and Eliquis (apixaban)  5 mg twice daily  for secondary stroke prevention and maintain strict control of hypertension with blood pressure goal below 130/90, diabetes with hemoglobin A1c goal below 6.5% and lipids with LDL cholesterol goal below 70 mg/dL. I also advised the patient to eat a healthy diet with plenty of whole grains, cereals, fruits and vegetables, exercise regularly and maintain ideal body weight .check follow-up lipid profile and if LDL is not optimal may need to consider adding Repatha or Praluent.  Followup in the future with me in 6 months or call earlier if necessary.Greater than 50% of time during this 35 minute visit was  spent on counseling,explanation of diagnosis, planning of further management, discussion with patient and family and coordination of care Antony Contras, MD Note: This document was prepared with digital dictation and possible smart phrase technology. Any transcriptional errors that result from this process are unintentional

## 2021-03-25 NOTE — Patient Instructions (Signed)
I had a long d/w patient about her  recent stroke,atrial fibrillation, risk for recurrent stroke/TIAs, personally independently reviewed imaging studies and stroke evaluation results and answered questions.Continue aspirin 81 mg daily and Eliquis (apixaban)  5 mg twice daily  for secondary stroke prevention and maintain strict control of hypertension with blood pressure goal below 130/90, diabetes with hemoglobin A1c goal below 6.5% and lipids with LDL cholesterol goal below 70 mg/dL. I also advised the patient to eat a healthy diet with plenty of whole grains, cereals, fruits and vegetables, exercise regularly and maintain ideal body weight .check follow-up lipid profile and if LDL is not optimal may need to consider adding Repatha or Praluent.  Followup in the future with me in 6 months or call earlier if necessary. Stroke Prevention Some medical conditions and behaviors can lead to a higher chance of having a stroke. You can help prevent a stroke by eating healthy, exercising, not smoking, and managing any medical conditions you have. Stroke is a leading cause of functional impairment. Primary prevention is particularly important because a majority of strokes are first-time events. Stroke changes the lives of not only those who experience a stroke but also their family and other caregivers. How can this condition affect me? A stroke is a medical emergency and should be treated right away. A stroke can lead to brain damage and can sometimes be life-threatening. If a person gets medical treatment right away, there is a better chance of surviving and recovering from a stroke. What can increase my risk? The following medical conditions may increase your risk of a stroke: Cardiovascular disease. High blood pressure (hypertension). Diabetes. High cholesterol. Sickle cell disease. Blood clotting disorders (hypercoagulable state). Obesity. Sleep disorders (obstructive sleep apnea). Other risk factors  include: Being older than age 25. Having a history of blood clots, stroke, or mini-stroke (transient ischemic attack, TIA). Genetic factors, such as race, ethnicity, or a family history of stroke. Smoking cigarettes or using other tobacco products. Taking birth control pills, especially if you also use tobacco. Heavy use of alcohol or drugs, especially cocaine and methamphetamine. Physical inactivity. What actions can I take to prevent this? Manage your health conditions High cholesterol levels. Eating a healthy diet is important for preventing high cholesterol. If cholesterol cannot be managed through diet alone, you may need to take medicines. Take any prescribed medicines to control your cholesterol as told by your health care provider. Hypertension. To reduce your risk of stroke, try to keep your blood pressure below 130/80. Eating a healthy diet and exercising regularly are important for controlling blood pressure. If these steps are not enough to manage your blood pressure, you may need to take medicines. Take any prescribed medicines to control hypertension as told by your health care provider. Ask your health care provider if you should monitor your blood pressure at home. Have your blood pressure checked every year, even if your blood pressure is normal. Blood pressure increases with age and some medical conditions. Diabetes. Eating a healthy diet and exercising regularly are important parts of managing your blood sugar (glucose). If your blood sugar cannot be managed through diet and exercise, you may need to take medicines. Take any prescribed medicines to control your diabetes as told by your health care provider. Get evaluated for obstructive sleep apnea. Talk to your health care provider about getting a sleep evaluation if you snore a lot or have excessive sleepiness. Make sure that any other medical conditions you have, such as atrial fibrillation or  atherosclerosis, are  managed. Nutrition Follow instructions from your health care provider about what to eat or drink to help manage your health condition. These instructions may include: Reducing your daily calorie intake. Limiting how much salt (sodium) you use to 1,500 milligrams (mg) each day. Using only healthy fats for cooking, such as olive oil, canola oil, or sunflower oil. Eating healthy foods. You can do this by: Choosing foods that are high in fiber, such as whole grains, and fresh fruits and vegetables. Eating at least 5 servings of fruits and vegetables a day. Try to fill one-half of your plate with fruits and vegetables at each meal. Choosing lean protein foods, such as lean cuts of meat, poultry without skin, fish, tofu, beans, and nuts. Eating low-fat dairy products. Avoiding foods that are high in sodium. This can help lower blood pressure. Avoiding foods that have saturated fat, trans fat, and cholesterol. This can help prevent high cholesterol. Avoiding processed and prepared foods. Counting your daily carbohydrate intake.  Lifestyle If you drink alcohol: Limit how much you have to: 0-1 drink a day for women who are not pregnant. 0-2 drinks a day for men. Know how much alcohol is in your drink. In the [place] one drink equals one 12 oz bottle of beer (378mL), one 5 oz glass of wine (136mL), or one 1 oz glass of hard liquor (40mL). Do not use any products that contain nicotine or tobacco. These products include cigarettes, chewing tobacco, and vaping devices, such as e-cigarettes. If you need help quitting, ask your health care provider. Avoid secondhand smoke. Do not use drugs. Activity  Try to stay at a healthy weight. Get at least 30 minutes of exercise on most days, such as: Fast walking. Biking. Swimming. Medicines Take over-the-counter and prescription medicines only as told by your health care provider. Aspirin or blood thinners (antiplatelets or anticoagulants) may be recommended  to reduce your risk of forming blood clots that can lead to stroke. Avoid taking birth control pills. Talk to your health care provider about the risks of taking birth control pills if: You are over 34 years old. You smoke. You get very bad headaches. You have had a blood clot. Where to find more information American Stroke Association: www.strokeassociation.org Get help right away if: You or a loved one has any symptoms of a stroke. "BE FAST" is an easy way to remember the main warning signs of a stroke: B - Balance. Signs are dizziness, sudden trouble walking, or loss of balance. E - Eyes. Signs are trouble seeing or a sudden change in vision. F - Face. Signs are sudden weakness or numbness of the face, or the face or eyelid drooping on one side. A - Arms. Signs are weakness or numbness in an arm. This happens suddenly and usually on one side of the body. S - Speech. Signs are sudden trouble speaking, slurred speech, or trouble understanding what people say. T - Time. Time to call emergency services. Write down what time symptoms started. You or a loved one has other signs of a stroke, such as: A sudden, severe headache with no known cause. Nausea or vomiting. Seizure. These symptoms may represent a serious problem that is an emergency. Do not wait to see if the symptoms will go away. Get medical help right away. Call your local emergency services (911 in the [place] Do not drive yourself to the hospital. Summary You can help to prevent a stroke by eating healthy, exercising, not smoking, limiting alcohol  intake, and managing any medical conditions you may have. Do not use any products that contain nicotine or tobacco. These include cigarettes, chewing tobacco, and vaping devices, such as e-cigarettes. If you need help quitting, ask your health care provider. Remember "BE FAST" for warning signs of a stroke. Get help right away if you or a loved one has any of these signs. This information  is not intended to replace advice given to you by your health care provider. Make sure you discuss any questions you have with your health care provider. Document Revised: 10/29/2019 Document Reviewed: 10/29/2019 Elsevier Patient Education  [place].

## 2021-03-26 ENCOUNTER — Other Ambulatory Visit: Payer: Self-pay

## 2021-03-26 LAB — LIPID PANEL
Chol/HDL Ratio: 2.7 ratio (ref 0.0–4.4)
Cholesterol, Total: 130 mg/dL (ref 100–199)
HDL: 49 mg/dL (ref >39)
LDL Chol Calc (NIH): 66 mg/dL (ref 0–99)
Triglycerides: 74 mg/dL (ref 0–149)
VLDL Cholesterol Cal: 15 mg/dL (ref 5–40)

## 2021-03-26 NOTE — Patient Outreach (Signed)
[facility]) Care Management  03/26/2021  LAJEANA STROUGH 02-16-1951 [id number]   No telephone outreach to patient to obtain mRS. As was obtained by Dr.Sethi 03/25/21. MRS= 0   [facility] Care Management Assistant

## 2021-03-30 ENCOUNTER — Encounter: Payer: Self-pay | Admitting: *Deleted

## 2021-03-31 DIAGNOSIS — M654 Radial styloid tenosynovitis [de Quervain]: Secondary | ICD-10-CM | POA: Insufficient documentation

## 2021-03-31 DIAGNOSIS — M79671 Pain in right foot: Secondary | ICD-10-CM | POA: Insufficient documentation

## 2021-03-31 DIAGNOSIS — M25532 Pain in left wrist: Secondary | ICD-10-CM | POA: Insufficient documentation

## 2021-04-01 ENCOUNTER — Encounter: Payer: Self-pay | Admitting: Internal Medicine

## 2021-04-16 ENCOUNTER — Encounter: Payer: Self-pay | Admitting: Internal Medicine

## 2021-04-16 ENCOUNTER — Ambulatory Visit (INDEPENDENT_AMBULATORY_CARE_PROVIDER_SITE_OTHER): Payer: Medicare Other | Admitting: Internal Medicine

## 2021-04-16 ENCOUNTER — Other Ambulatory Visit: Payer: Self-pay

## 2021-04-16 VITALS — BP 124/66 | HR 62 | Ht 66.5 in | Wt 191.2 lb

## 2021-04-16 DIAGNOSIS — I4819 Other persistent atrial fibrillation: Secondary | ICD-10-CM

## 2021-04-16 DIAGNOSIS — I484 Atypical atrial flutter: Secondary | ICD-10-CM | POA: Diagnosis not present

## 2021-04-16 NOTE — Progress Notes (Signed)
HPI Renee Benitez returns today for followup. She is a pleasant 71 yo woman with a h/o atrial fib who then developed atrial flutter on flecainide. She underwent EP study where she was found to have both right and left atrial flutter and both were successfully ablated. She has had very rare symptoms in the interim. She admits to some dietary indiscretion and her weight continues to increase.  No Known Allergies   Current Outpatient Medications  Medication Sig Dispense Refill   acetaminophen (TYLENOL) 500 MG tablet Take 1,000 mg by mouth every 6 (six) hours as needed for moderate pain or headache.     aspirin 81 MG EC tablet Take 1 tablet (81 mg total) by mouth daily. 150 tablet 2   Calcium Carb-Cholecalciferol (CALCIUM 600 + D PO) Take 1 tablet by mouth every evening.     Cholecalciferol (DIALYVITE VITAMIN D 5000) 125 MCG (5000 UT) capsule Take 5,000 Units by mouth every evening.     diltiazem (CARDIZEM CD) 180 MG 24 hr capsule TAKE 1 CAPSULE BY MOUTH  DAILY 90 capsule 2   diltiazem (CARDIZEM) 60 MG tablet Take 60 mg by mouth daily as needed (afib).     ELIQUIS 5 MG TABS tablet TAKE ONE TABLET (5MG  TOTAL) BY MOUTH TWOTIMES DAILY 180 tablet 3   escitalopram (LEXAPRO) 20 MG tablet Take 20 mg by mouth daily.     flecainide (TAMBOCOR) 100 MG tablet Take 1 tablet (100 mg total) by mouth 2 (two) times daily. 180 tablet 3   levothyroxine (SYNTHROID) 50 MCG tablet Take 50 mcg by mouth daily at 2 am.     losartan (COZAAR) 25 MG tablet Take 25 mg by mouth every evening.     magnesium oxide (MAG-OX) 400 MG tablet Take 400 mg by mouth at bedtime.     metoprolol tartrate (LOPRESSOR) 25 MG tablet Take 1 tablet by mouth every 8 hours as needed for breakthrough afib (Patient taking differently: Take 12.5 mg by mouth every 8 (eight) hours as needed (breakthrough afib).) 60 tablet 3   Multiple Vitamins-Minerals (MULTIVITAMIN WITH MINERALS) tablet Take 1 tablet by mouth in the morning. Women's One A Day      Multiple Vitamins-Minerals (PRESERVISION AREDS 2) CAPS Take 1 capsule by mouth 2 (two) times daily.     Omega-3 Fatty Acids (FISH OIL ULTRA) 1400 MG CAPS Take 1,400 mg by mouth in the morning.     pantoprazole (PROTONIX) 40 MG tablet Take 1 tablet (40 mg total) by mouth daily before breakfast. 90 tablet 0   rosuvastatin (CRESTOR) 20 MG tablet Take 20 mg by mouth in the morning.     No current facility-administered medications for this visit.     Past Medical History:  Diagnosis Date   Atrial fibrillation [facility])    Current use of estrogen therapy 01/31/2013   Dysrhythmia    AFib   Hypertension    Macular degeneration    Menopausal syndrome    Right ovarian cyst 01/25/2014    ROS:   All systems reviewed and negative except as noted in the HPI.   Past Surgical History:  Procedure Laterality Date   A-FLUTTER ABLATION N/A 03/18/2021   Procedure: A-FLUTTER ABLATION;  Surgeon: Evans Lance, MD;  Location: [place] [facility];  Service: Cardiovascular;  Laterality: N/A;   ABDOMINAL HYSTERECTOMY     APPENDECTOMY     CARDIOVERSION N/A 09/14/2016   Procedure: CARDIOVERSION;  Surgeon: Satira Sark, MD;  [facility]: [facility];  Service: Cardiovascular;  Laterality: N/A;   CARDIOVERSION N/A 02/09/2021   Procedure: CARDIOVERSION;  Surgeon: Josue Hector, MD;  Location: [facility] ENDOSCOPY;  Service: Cardiovascular;  Laterality: N/A;   CATARACT EXTRACTION W/ INTRAOCULAR LENS  IMPLANT, BILATERAL Bilateral    COLONOSCOPY  12/04/03   Friable anal canal otherwise normal rectum and colon   COLONOSCOPY N/A 03/19/2013   Dr. Gala Romney: diverticulosis, hemorrhoids. next TCS 10 years   COLONOSCOPY N/A 05/02/2019   Procedure: COLONOSCOPY;  Surgeon: Daneil Dolin, MD;  [facility];  Service: Endoscopy;  Laterality: N/A;  7:30am   ESOPHAGOGASTRODUODENOSCOPY N/A 10/17/2018   Procedure: ESOPHAGOGASTRODUODENOSCOPY (EGD);  Surgeon: Daneil Dolin, MD;  [facility];  Service: Endoscopy;   Laterality: N/A;  3:00pm   IR CT HEAD LTD  01/05/2021   IR CT HEAD LTD  01/05/2021   IR INTRA CRAN STENT  01/05/2021   IR PERCUTANEOUS ART THROMBECTOMY/INFUSION INTRACRANIAL INC DIAG ANGIO  01/05/2021   IR US GUIDE VASC ACCESS LEFT  01/05/2021   MALONEY DILATION N/A 10/17/2018   Procedure: Venia Minks DILATION;  Surgeon: Daneil Dolin, MD;  [facility];  Service: Endoscopy;  Laterality: N/A;   RADIOLOGY WITH ANESTHESIA N/A 01/05/2021   Procedure: IR WITH ANESTHESIA;  Surgeon: Radiologist, Medication, MD;  Location: [place];  Service: Radiology;  Laterality: N/A;   TEE WITHOUT CARDIOVERSION N/A 09/14/2016   Procedure: TRANSESOPHAGEAL ECHOCARDIOGRAM (TEE) WITH PROPOFOL;  Surgeon: Satira Sark, MD;  Location: [facility];  Service: Cardiovascular;  Laterality: N/A;   TONSILECTOMY, ADENOIDECTOMY, BILATERAL MYRINGOTOMY AND TUBES       Family History  Problem Relation Age of Onset   Coronary artery disease Father    Cancer Father        bladder cancer   Arrhythmia Sister        Reportedly had ablation of SVT   Hypertension Sister    Colon cancer Sister 81       s/p surgical resection, no chemo/radiation   Colon cancer Maternal Grandmother    Hypertension Son      Social History   Socioeconomic History   Marital status: Married    Spouse name: Not on file   Number of children: Not on file   Years of education: Not on file   Highest education level: Not on file  Occupational History   Occupation: Nurse    Comment: Personal assistant  Tobacco Use   Smoking status: Never   Smokeless tobacco: Never   Tobacco comments:    Never smoked  Vaping Use   Vaping Use: Never used  Substance and Sexual Activity   Alcohol use: Not Currently    Alcohol/week: 1.0 standard drink    Types: 1 Glasses of wine per week    Comment: occ wine   Drug use: No   Sexual activity: Yes    Birth control/protection: Surgical    Comment: hyst  Other Topics Concern   Not on file  Social History  Narrative   Not on file   Social Determinants of Health   Financial Resource Strain: Low Risk    Difficulty of Paying Living Expenses: Not hard at all  Food Insecurity: No Food Insecurity   Worried About Charity fundraiser in the Last Year: Never true   [place] in the Last Year: Never true  Transportation Needs: No Transportation Needs   Lack of Transportation (Medical): No   Lack of Transportation (Non-Medical): No  Physical  Activity: Insufficiently Active   Days of Exercise per Week: 1 day   Minutes of Exercise per Session: 30 min  Stress: Stress Concern Present   Feeling of Stress : To some extent  Social Connections: Socially Integrated   Frequency of Communication with Friends and Family: Three times a week   Frequency of Social Gatherings with Friends and Family: Twice a week   Attends Religious Services: More than 4 times per year   Active Member of [employer] or Organizations: Yes   Attends Music therapist: More than 4 times per year   Marital Status: Married  Human resources officer Violence: Not At Risk   Fear of Current or Ex-Partner: No   Emotionally Abused: No   Physically Abused: No   Sexually Abused: No     BP 124/66    Pulse 62    Ht 5' 6.5" (1.689 m)    Wt 191 lb 3.2 oz (86.7 kg)    SpO2 97%    BMI 30.40 kg/m   Physical Exam:  Well appearing NAD HEENT: Unremarkable Neck:  No JVD, no thyromegally Lymphatics:  No adenopathy Back:  No CVA tenderness Lungs:  Clear with no wheezes HEART:  Regular rate rhythm, no murmurs, no rubs, no clicks Abd:  soft, positive bowel sounds, no organomegally, no rebound, no guarding Ext:  2 plus pulses, no edema, no cyanosis, no clubbing Skin:  No rashes no nodules Neuro:  CN II through XII intact, motor grossly intact   Apple watch - NSR and sinus brady and atrial fib   Assess/Plan:  Atrial flutter - she is s/p very difficult ablation and appears to  be doing well. Atrial fib - she is maintaining NSR on  flecainide. Continue. Obesity - I encouraged her to lose weight.  Snoring - she thinks that this is mild based on her husbands report. I encouraged her to lose weight. She is asked to consider a sleep eval if her snoring worsens.  Carleene Overlie Renee Beery,MD

## 2021-04-16 NOTE — Patient Instructions (Signed)
Medication Instructions:  Your physician recommends that you continue on your current medications as directed. Please refer to the Current Medication list given to you today.  *If you need a refill on your cardiac medications before your next appointment, please call your pharmacy*   Lab Work: NONE   If you have labs (blood work) drawn today and your tests are completely normal, you will receive your results only by: MyChart Message (if you have MyChart) OR A paper copy in the mail If you have any lab test that is abnormal or we need to change your treatment, we will call you to review the results.   Testing/Procedures: NONE    Follow-Up: At [facility], you and your health needs are our priority.  As part of our continuing mission to provide you with exceptional heart care, we have created designated Provider Care Teams.  These Care Teams include your primary Cardiologist (physician) and Advanced Practice Providers (APPs -  Physician Assistants and Nurse Practitioners) who all work together to provide you with the care you need, when you need it.  We recommend signing up for the patient portal called "MyChart".  Sign up information is provided on this After Visit Summary.  MyChart is used to connect with patients for Virtual Visits (Telemedicine).  Patients are able to view lab/test results, encounter notes, upcoming appointments, etc.  Non-urgent messages can be sent to your provider as well.   To learn more about what you can do with MyChart, go to [email or web address].    Your next appointment:   6 month(s)  The format for your next appointment:   In Person  Provider:   Gregg Taylor, MD   Other Instructions Thank you for choosing [facility]!    

## 2021-04-22 ENCOUNTER — Other Ambulatory Visit: Payer: Self-pay | Admitting: Internal Medicine

## 2021-04-27 ENCOUNTER — Other Ambulatory Visit: Payer: Self-pay | Admitting: Gastroenterology

## 2021-05-01 ENCOUNTER — Telehealth: Payer: Self-pay

## 2021-05-01 NOTE — Telephone Encounter (Signed)
Pt called back after you were gone and LMOVM regarding her Rx for Protonix. The vm stated pharmacy had sent over the request. Please call this pt.

## 2021-05-04 NOTE — Telephone Encounter (Signed)
Medication was sent in on 05/02/21.

## 2021-05-26 ENCOUNTER — Telehealth (HOSPITAL_COMMUNITY): Payer: Self-pay

## 2021-05-26 NOTE — Telephone Encounter (Signed)
Called to schedule angiogram with Dr. Debbrah Alar, no answer, left vm. AW

## 2021-06-03 ENCOUNTER — Other Ambulatory Visit: Payer: Self-pay

## 2021-06-03 ENCOUNTER — Ambulatory Visit (HOSPITAL_COMMUNITY)
Admission: RE | Admit: 2021-06-03 | Discharge: 2021-06-03 | Disposition: A | Discharge status: Home / Self Care | Payer: Medicare Other | Source: Ambulatory Visit | Attending: Physician Assistant | Admitting: Physician Assistant

## 2021-06-03 ENCOUNTER — Encounter (HOSPITAL_COMMUNITY): Payer: Self-pay | Admitting: Physician Assistant

## 2021-06-03 VITALS — BP 122/84 | HR 93 | Ht 66.5 in | Wt 188.8 lb

## 2021-06-03 DIAGNOSIS — I440 Atrioventricular block, first degree: Secondary | ICD-10-CM | POA: Diagnosis not present

## 2021-06-03 DIAGNOSIS — R001 Bradycardia, unspecified: Secondary | ICD-10-CM | POA: Insufficient documentation

## 2021-06-03 DIAGNOSIS — E669 Obesity, unspecified: Secondary | ICD-10-CM | POA: Insufficient documentation

## 2021-06-03 DIAGNOSIS — I471 Supraventricular tachycardia: Secondary | ICD-10-CM | POA: Insufficient documentation

## 2021-06-03 DIAGNOSIS — I4892 Unspecified atrial flutter: Secondary | ICD-10-CM | POA: Insufficient documentation

## 2021-06-03 DIAGNOSIS — I10 Essential (primary) hypertension: Secondary | ICD-10-CM | POA: Insufficient documentation

## 2021-06-03 DIAGNOSIS — Z8673 Personal history of transient ischemic attack (TIA), and cerebral infarction without residual deficits: Secondary | ICD-10-CM | POA: Diagnosis not present

## 2021-06-03 DIAGNOSIS — Z79899 Other long term (current) drug therapy: Secondary | ICD-10-CM | POA: Insufficient documentation

## 2021-06-03 DIAGNOSIS — I484 Atypical atrial flutter: Secondary | ICD-10-CM

## 2021-06-03 DIAGNOSIS — D6869 Other thrombophilia: Secondary | ICD-10-CM

## 2021-06-03 DIAGNOSIS — Z7901 Long term (current) use of anticoagulants: Secondary | ICD-10-CM | POA: Diagnosis not present

## 2021-06-03 DIAGNOSIS — Z6830 Body mass index (BMI) 30.0-30.9, adult: Secondary | ICD-10-CM | POA: Insufficient documentation

## 2021-06-03 DIAGNOSIS — I4819 Other persistent atrial fibrillation: Secondary | ICD-10-CM | POA: Diagnosis not present

## 2021-06-03 DIAGNOSIS — R0683 Snoring: Secondary | ICD-10-CM | POA: Insufficient documentation

## 2021-06-03 DIAGNOSIS — R400 Somnolence: Secondary | ICD-10-CM | POA: Diagnosis not present

## 2021-06-03 NOTE — Progress Notes (Signed)
Primary Care Physician: Aliene Beams, MD Primary Cardiologist: Dr Diona Browner  Primary Electrophysiologist: Dr Ladona Ridgel Referring Physician: Nena Polio NP   Renee Benitez is a 71 y.o. female with a history of HTN, CVA, and atrial fibrillation who presents for follow up in the [facility]. Patient is on Eliquis for a CHADS2VASC score of 5. She has been maintained on flecainide and diltiazem but called in with symptoms of ongoing palpitations and SOB with exertion on 07/22/20. She admits that her symptoms had been ongoing for about 2 months. ECG 07/29/20 showed atypical atrial flutter with rapid rates. Her diltiazem was increased. She was also started on metoprolol which converted her to SR.  Patient was hospitalized with left sided weakness on 01/05/21 and was diagnosed with an acute CVA. TPA was not given because she was on Eliquis but she did have successful mechanical thrombectomy of PCA with stenting and angioplasty. She reports that she was 3 hours late taking her Eliquis that day but did not miss any doses. She has been referred by her PCP for a hypercoagulable evaluation with hematology.   On follow up today, patient is s/p atrial flutter (CTI and mitral annular) ablation with Dr Ladona Ridgel on 03/18/21. She now has an Apple watch which has shown some episodes of afib over the past two months. The episodes occur about once per week and last about 3-4 hours. She is not as symptomatic as she was prior to ablation. She denies any bleeding issues on anticoagulation.   Today, she denies symptoms of chest pain, shortness of breath, orthopnea, PND, lower extremity edema, dizziness, presyncope, syncope, bleeding, or neurologic sequela. The patient is tolerating medications without difficulties and is otherwise without complaint today.    Atrial Fibrillation Risk Factors:  she does have symptoms or diagnosis of sleep apnea. she does not have a history of rheumatic  fever.   she has a BMI of Body mass index is 30.02 kg/m.Marland Kitchen Filed Weights   06/03/21 1057  Weight: 85.6 kg       Family History  Problem Relation Age of Onset   Coronary artery disease Father    Cancer Father        bladder cancer   Arrhythmia Sister        Reportedly had ablation of SVT   Hypertension Sister    Colon cancer Sister 3       s/p surgical resection, no chemo/radiation   Colon cancer Maternal Grandmother    Hypertension Son      Atrial Fibrillation Management history:  Previous antiarrhythmic drugs: flecainide  Previous cardioversions: 2018, 02/09/21 Previous ablations: 03/18/21 CHADS2VASC score: 5 Anticoagulation history: Eliquis   Past Medical History:  Diagnosis Date   Atrial fibrillation (HCC)    Current use of estrogen therapy 01/31/2013   Dysrhythmia    AFib   Hypertension    Macular degeneration    Menopausal syndrome    Right ovarian cyst 01/25/2014   Past Surgical History:  Procedure Laterality Date   A-FLUTTER ABLATION N/A 03/18/2021   Procedure: A-FLUTTER ABLATION;  Surgeon: [name], MD;  Location: [facility];  Service: Cardiovascular;  Laterality: N/A;   ABDOMINAL HYSTERECTOMY     APPENDECTOMY     CARDIOVERSION N/A 09/14/2016   Procedure: CARDIOVERSION;  Surgeon: [name], MD;  Location[facility];  Service: Cardiovascular;  Laterality: N/A;   CARDIOVERSION N/A 02/09/2021   Procedure: CARDIOVERSION;  Surgeon: [name], MD;  Location: [facility]  ENDOSCOPY;  Service: Cardiovascular;  Laterality: N/A;   CATARACT EXTRACTION W/ INTRAOCULAR LENS  IMPLANT, BILATERAL Bilateral    COLONOSCOPY  12/04/03   Friable anal canal otherwise normal rectum and colon   COLONOSCOPY N/A 03/19/2013   Dr. Gala Romney: diverticulosis, hemorrhoids. next TCS 10 years   COLONOSCOPY N/A 05/02/2019   Procedure: COLONOSCOPY;  Surgeon: Daneil Dolin, MD;  [facility];  Service: Endoscopy;  Laterality: N/A;  7:30am    ESOPHAGOGASTRODUODENOSCOPY N/A 10/17/2018   Procedure: ESOPHAGOGASTRODUODENOSCOPY (EGD);  Surgeon: Daneil Dolin, MD;  [facility];  Service: Endoscopy;  Laterality: N/A;  3:00pm   IR CT HEAD LTD  01/05/2021   IR CT HEAD LTD  01/05/2021   IR INTRA CRAN STENT  01/05/2021   IR PERCUTANEOUS ART THROMBECTOMY/INFUSION INTRACRANIAL INC DIAG ANGIO  01/05/2021   IR US GUIDE VASC ACCESS LEFT  01/05/2021   MALONEY DILATION N/A 10/17/2018   Procedure: Venia Minks DILATION;  Surgeon: Daneil Dolin, MD;  [facility];  Service: Endoscopy;  Laterality: N/A;   RADIOLOGY WITH ANESTHESIA N/A 01/05/2021   Procedure: IR WITH ANESTHESIA;  Surgeon: Radiologist, Medication, MD;  Location: [place];  Service: Radiology;  Laterality: N/A;   TEE WITHOUT CARDIOVERSION N/A 09/14/2016   Procedure: TRANSESOPHAGEAL ECHOCARDIOGRAM (TEE) WITH PROPOFOL;  Surgeon: Satira Sark, MD;  [facility];  Service: Cardiovascular;  Laterality: N/A;   TONSILECTOMY, ADENOIDECTOMY, BILATERAL MYRINGOTOMY AND TUBES      Current Outpatient Medications  Medication Sig Dispense Refill   acetaminophen (TYLENOL) 500 MG tablet Take 1,000 mg by mouth every 6 (six) hours as needed for moderate pain or headache.     aspirin 81 MG EC tablet Take 1 tablet (81 mg total) by mouth daily. 150 tablet 2   Calcium Carb-Cholecalciferol (CALCIUM 600 + D PO) Take 1 tablet by mouth every evening.     Cholecalciferol (DIALYVITE VITAMIN D 5000) 125 MCG (5000 UT) capsule Take 5,000 Units by mouth every evening.     diltiazem (CARDIZEM CD) 180 MG 24 hr capsule TAKE 1 CAPSULE BY MOUTH  DAILY 90 capsule 2   diltiazem (CARDIZEM) 60 MG tablet Take 60 mg by mouth daily as needed (afib).     ELIQUIS 5 MG TABS tablet TAKE ONE TABLET (5MG  TOTAL) BY MOUTH TWOTIMES DAILY 180 tablet 3   escitalopram (LEXAPRO) 20 MG tablet Take 20 mg by mouth daily.     flecainide (TAMBOCOR) 100 MG tablet TAKE 1 TABLET BY MOUTH  TWICE DAILY 240 tablet 2   levothyroxine  (SYNTHROID) 50 MCG tablet Take 50 mcg by mouth daily at 2 am.     losartan (COZAAR) 25 MG tablet Take 25 mg by mouth every evening.     magnesium oxide (MAG-OX) 400 MG tablet Take 400 mg by mouth at bedtime.     metoprolol tartrate (LOPRESSOR) 25 MG tablet Take 1 tablet by mouth every 8 hours as needed for breakthrough afib 60 tablet 3   Multiple Vitamins-Minerals (MULTIVITAMIN WITH MINERALS) tablet Take 1 tablet by mouth in the morning. Women's One A Day     Multiple Vitamins-Minerals (PRESERVISION AREDS 2) CAPS Take 1 capsule by mouth 2 (two) times daily.     Omega-3 Fatty Acids (FISH OIL ULTRA) 1400 MG CAPS Take 1,400 mg by mouth in the morning.     pantoprazole (PROTONIX) 40 MG tablet TAKE ONE TABLET (40MG  TOTAL) BY MOUTH DAILY 90 tablet 0   rosuvastatin (CRESTOR) 20 MG tablet Take 20 mg  by mouth in the morning.     No current facility-administered medications for this encounter.    No Known Allergies  Social History   Socioeconomic History   Marital status: Married    Spouse name: Not on file   Number of children: Not on file   Years of education: Not on file   Highest education level: Not on file  Occupational History   Occupation: Nurse    Comment: [employer]  Tobacco Use   Smoking status: Never   Smokeless tobacco: Never   Tobacco comments:    Never smoked  Vaping Use   Vaping Use: Never used  Substance and Sexual Activity   Alcohol use: Not Currently    Alcohol/week: 1.0 standard drink    Types: 1 Glasses of wine per week    Comment: occ wine   Drug use: No   Sexual activity: Yes    Birth control/protection: Surgical    Comment: hyst  Other Topics Concern   Not on file  Social History Narrative   Not on file   Social Determinants of Health   Financial Resource Strain: Low Risk    Difficulty of Paying Living Expenses: Not hard at all  Food Insecurity: No Food Insecurity   Worried About Charity fundraiser in the Last Year: Never true   Ran Out of  Food in the Last Year: Never true  Transportation Needs: No Transportation Needs   Lack of Transportation (Medical): No   Lack of Transportation (Non-Medical): No  Physical Activity: Insufficiently Active   Days of Exercise per Week: 1 day   Minutes of Exercise per Session: 30 min  Stress: Stress Concern Present   Feeling of Stress : To some extent  Social Connections: Socially Integrated   Frequency of Communication with Friends and Family: Three times a week   Frequency of Social Gatherings with Friends and Family: Twice a week   Attends Religious Services: More than 4 times per year   Active Member of [employer] or Organizations: Yes   Attends Music therapist: More than 4 times per year   Marital Status: Married  Human resources officer Violence: Not At Risk   Fear of Current or Ex-Partner: No   Emotionally Abused: No   Physically Abused: No   Sexually Abused: No     ROS- All systems are reviewed and negative except as per the HPI above.  Physical Exam: Vitals:   06/03/21 1057  BP: 122/84  Pulse: 93  Weight: 85.6 kg  Height: 5' 6.5" (1.689 m)    GEN- The patient is a well appearing obese female, alert and oriented x 3 today.   HEENT-head normocephalic, atraumatic, sclera clear, conjunctiva pink, hearing intact, trachea midline. Lungs- Clear to ausculation bilaterally, normal work of breathing Heart- Regular rate and rhythm, no murmurs, rubs or gallops  GI- soft, NT, ND, + BS Extremities- no clubbing, cyanosis, or edema MS- no significant deformity or atrophy Skin- no rash or lesion Psych- euthymic mood, full affect Neuro- strength and sensation are intact   Wt Readings from Last 3 Encounters:  06/03/21 85.6 kg  04/16/21 86.7 kg  03/25/21 86.2 kg    EKG today demonstrates  SR Vent. rate 93 BPM PR interval 200 ms QRS duration 100 ms QT/QTcB 372/462 ms  Echo 01/06/21 demonstrated  1. Left ventricular ejection fraction, by estimation, is >75%. The left   ventricle has hyperdynamic function. The left ventricle has no regional  wall motion abnormalities. There  is moderate left ventricular hypertrophy. Left ventricular diastolic parameters are consistent with Grade II diastolic dysfunction (pseudonormalization).   2. Right ventricular systolic function is normal. The right ventricular  size is normal. There is normal pulmonary artery systolic pressure.   3. Left atrial size was mildly dilated.   4. The mitral valve is normal in structure. Mild mitral valve  regurgitation. No evidence of mitral stenosis.   5. The aortic valve is calcified. Aortic valve regurgitation is mild to  moderate. Mild aortic valve sclerosis is present, with no evidence of  aortic valve stenosis.   6. The inferior vena cava is normal in size with greater than 50%  respiratory variability, suggesting right atrial pressure of 3 mmHg.   Conclusion(s)/Recommendation(s): No intracardiac source of embolism detected on this transthoracic study. A transesophageal echocardiogram is recommended to exclude cardiac source of embolism if clinically indicated.   Epic records are reviewed at length today  CHA2DS2-VASc Score = 5  The patient's score is based upon: CHF History: 0 HTN History: 1 Diabetes History: 0 Stroke History: 2 Vascular Disease History: 0 Age Score: 1 Gender Score: 1       ASSESSMENT AND PLAN: 1. Persistent Atrial Fibrillation/atrial flutter The patient's CHA2DS2-VASc score is 5, indicating a 7.2% annual risk of stroke.   S/p atrial flutter ablation 03/18/21 Continue flecainide 100 mg BID  Continue diltiazem 180 mg daily with 30-60 mg PRN q 4 hours for heart racing. Continue Eliquis 5 mg BID. ASA added for 6 months after recent CVA. Will check sleep study If her afib continues to be frequent or becomes persistent, could consider PVI ablation. We discussed dofetilide today and she would prefer to avoid a hospitalization if possible.   2. Secondary  Hypercoagulable State (ICD10:  D68.69) The patient is at significant risk for stroke/thromboembolism based upon her CHA2DS2-VASc Score of 5.  Continue Apixaban (Eliquis).   3. Obesity Body mass index is 30.02 kg/m. Lifestyle modification was discussed and encouraged including regular physical activity and weight reduction.  4. HTN Stable, no changes today.  5. Snoring/daytime somnolence  The importance of adequate treatment of sleep apnea was discussed today in order to improve our ability to maintain sinus rhythm long term. Will refer for sleep study.   Follow up with Dr Lovena Le as scheduled, sooner if needed.    [facility] [address] [place], [place] [address] [phone] 06/03/2021 11:25 AM

## 2021-06-15 ENCOUNTER — Other Ambulatory Visit: Payer: Self-pay | Admitting: Radiology

## 2021-06-16 ENCOUNTER — Ambulatory Visit (HOSPITAL_COMMUNITY)
Admission: RE | Admit: 2021-06-16 | Discharge: 2021-06-16 | Disposition: A | Discharge status: Home / Self Care | Payer: Medicare Other | Source: Ambulatory Visit | Attending: Physician Assistant | Admitting: Physician Assistant

## 2021-06-16 ENCOUNTER — Other Ambulatory Visit: Payer: Self-pay

## 2021-06-16 ENCOUNTER — Other Ambulatory Visit (HOSPITAL_COMMUNITY): Payer: Self-pay | Admitting: Neuroradiology

## 2021-06-16 DIAGNOSIS — I63431 Cerebral infarction due to embolism of right posterior cerebral artery: Secondary | ICD-10-CM

## 2021-06-16 DIAGNOSIS — Z79899 Other long term (current) drug therapy: Secondary | ICD-10-CM | POA: Diagnosis not present

## 2021-06-16 DIAGNOSIS — Z7901 Long term (current) use of anticoagulants: Secondary | ICD-10-CM | POA: Insufficient documentation

## 2021-06-16 DIAGNOSIS — I4891 Unspecified atrial fibrillation: Secondary | ICD-10-CM | POA: Insufficient documentation

## 2021-06-16 DIAGNOSIS — Z7982 Long term (current) use of aspirin: Secondary | ICD-10-CM | POA: Diagnosis not present

## 2021-06-16 DIAGNOSIS — I10 Essential (primary) hypertension: Secondary | ICD-10-CM | POA: Insufficient documentation

## 2021-06-16 DIAGNOSIS — H3530 Unspecified macular degeneration: Secondary | ICD-10-CM | POA: Insufficient documentation

## 2021-06-16 DIAGNOSIS — I639 Cerebral infarction, unspecified: Secondary | ICD-10-CM | POA: Diagnosis present

## 2021-06-16 DIAGNOSIS — Z79890 Hormone replacement therapy (postmenopausal): Secondary | ICD-10-CM | POA: Insufficient documentation

## 2021-06-16 HISTORY — PX: IR ANGIO VERTEBRAL SEL VERTEBRAL UNI R MOD SED: IMG5368

## 2021-06-16 HISTORY — PX: IR ANGIO INTRA EXTRACRAN SEL INTERNAL CAROTID BILAT MOD SED: IMG5363

## 2021-06-16 HISTORY — PX: IR US GUIDE VASC ACCESS RIGHT: IMG2390

## 2021-06-16 LAB — CBC
HCT: 42 % (ref 36.0–46.0)
Hemoglobin: 14.3 g/dL (ref 12.0–15.0)
MCH: 30.7 pg (ref 26.0–34.0)
MCHC: 34 g/dL (ref 30.0–36.0)
MCV: 90.1 fL (ref 80.0–100.0)
Platelets: 225 10*3/uL (ref 150–400)
RBC: 4.66 MIL/uL (ref 3.87–5.11)
RDW: 13.2 % (ref 11.5–15.5)
WBC: 8.9 10*3/uL (ref 4.0–10.5)
nRBC: 0 % (ref 0.0–0.2)

## 2021-06-16 LAB — BASIC METABOLIC PANEL
Anion gap: 9 (ref 5–15)
BUN: 20 mg/dL (ref 8–23)
CO2: 25 mmol/L (ref 22–32)
Calcium: 9.4 mg/dL (ref 8.9–10.3)
Chloride: 103 mmol/L (ref 98–111)
Creatinine, Ser: 1 mg/dL (ref 0.44–1.00)
GFR, Estimated: >60 mL/min (ref >60)
Glucose, Bld: 95 mg/dL (ref 70–99)
Potassium: 5.5 mmol/L — ABNORMAL HIGH (ref 3.5–5.1)
Sodium: 137 mmol/L (ref 135–145)

## 2021-06-16 LAB — PROTIME-INR
INR: 1.2 (ref 0.8–1.2)
Prothrombin Time: 15 seconds (ref 11.4–15.2)

## 2021-06-16 MED ORDER — NITROGLYCERIN 1 MG/10 ML FOR IR/CATH LAB
INTRA_ARTERIAL | Status: AC
Start: 2021-06-16 — End: 2021-06-16
  Filled 2021-06-16: qty 10

## 2021-06-16 MED ORDER — HEPARIN SODIUM (PORCINE) 1000 UNIT/ML IJ SOLN
INTRAMUSCULAR | Status: AC
  Filled 2021-06-16: qty 10

## 2021-06-16 MED ORDER — MIDAZOLAM HCL 2 MG/2ML IJ SOLN
INTRAMUSCULAR | Status: DC | PRN
Start: 2021-06-16 — End: 2021-06-17
  Administered 2021-06-16: 1 mg via INTRAVENOUS

## 2021-06-16 MED ORDER — FENTANYL CITRATE (PF) 100 MCG/2ML IJ SOLN
INTRAMUSCULAR | Status: AC
  Filled 2021-06-16: qty 4

## 2021-06-16 MED ORDER — VERAPAMIL HCL 2.5 MG/ML IV SOLN
INTRA_ARTERIAL | Status: DC | PRN
  Administered 2021-06-16: 9 mL via INTRA_ARTERIAL

## 2021-06-16 MED ORDER — MIDAZOLAM HCL 2 MG/2ML IJ SOLN
INTRAMUSCULAR | Status: AC
  Filled 2021-06-16: qty 4

## 2021-06-16 MED ORDER — IOHEXOL 300 MG/ML  SOLN
100.0000 mL | Freq: Once | INTRAMUSCULAR | Status: AC | PRN
  Administered 2021-06-16: 75 mL via INTRA_ARTERIAL

## 2021-06-16 MED ORDER — NITROGLYCERIN 1 MG/10 ML FOR IR/CATH LAB
INTRA_ARTERIAL | Status: DC | PRN
  Administered 2021-06-16: 3 mL via INTRA_ARTERIAL

## 2021-06-16 MED ORDER — SODIUM CHLORIDE 0.9 % IV SOLN: Freq: Once | INTRAVENOUS | Status: AC

## 2021-06-16 MED ORDER — FENTANYL CITRATE (PF) 100 MCG/2ML IJ SOLN
INTRAMUSCULAR | Status: DC | PRN
  Administered 2021-06-16: 25 ug via INTRAVENOUS

## 2021-06-16 MED ORDER — VERAPAMIL HCL 2.5 MG/ML IV SOLN
INTRAVENOUS | Status: AC
  Filled 2021-06-16: qty 2

## 2021-06-16 MED ORDER — LIDOCAINE HCL 1 % IJ SOLN
INTRAMUSCULAR | Status: AC
  Filled 2021-06-16: qty 20

## 2021-06-16 NOTE — Sedation Documentation (Signed)
Pt states normal BP is bradycardic or a-fib. Pt states when it is sinus bradycardic, her heart rate is in the 40s ?

## 2021-06-16 NOTE — H&P (Signed)
Chief Complaint: Right P1/PCA occlusion s/p stent placement. Patient presents for stent patency.  Referring Physician(s): [name]  Supervising Physician: [name]  Patient Status: [facility] - Out-pt  History of Present Illness: Renee Benitez is a 71 y.o. female History of  a fib (on eliquis), HTN, macular degeneration  CVA ( 9.26.22). Patient under went a mechanical thrombectomy followed by stenting and angioplasty for treatment of a right P1/PCA occlusion with underlying proximal P2 segment stenosis by Dr. Ainsley Spinner on 9.26.22. Patient presents for 6 month follow up cerebral angiogram for evaluation of the stents patency.  Currently without any significant complaints. Patient alert and laying in bed, calm and comfortable. Denies any fevers, headache, chest pain, SOB, cough, abdominal pain, nausea, vomiting or bleeding. Return precautions and treatment recommendations and follow-up discussed with the patient  who is agreeable with the plan.    Past Medical History:  Diagnosis Date   Atrial fibrillation [facility])    Current use of estrogen therapy 01/31/2013   Dysrhythmia    AFib   Hypertension    Macular degeneration    Menopausal syndrome    Right ovarian cyst 01/25/2014    Past Surgical History:  Procedure Laterality Date   A-FLUTTER ABLATION N/A 03/18/2021   Procedure: A-FLUTTER ABLATION;  Surgeon: [name], MD;  Location: [facility] INVASIVE CV LAB;  Service: Cardiovascular;  Laterality: N/A;   ABDOMINAL HYSTERECTOMY     APPENDECTOMY     CARDIOVERSION N/A 09/14/2016   Procedure: CARDIOVERSION;  Surgeon: [name], MD;  Location: [facility] ORS;  Service: Cardiovascular;  Laterality: N/A;   CARDIOVERSION N/A 02/09/2021   Procedure: CARDIOVERSION;  Surgeon: [name], MD;  Location: [facility] ENDOSCOPY;  Service: Cardiovascular;  Laterality: N/A;   CATARACT EXTRACTION W/ INTRAOCULAR LENS  IMPLANT, BILATERAL Bilateral    COLONOSCOPY   12/04/03   Friable anal canal otherwise normal rectum and colon   COLONOSCOPY N/A 03/19/2013   Dr. [name]: diverticulosis, hemorrhoids. next TCS 10 years   COLONOSCOPY N/A 05/02/2019   Procedure: COLONOSCOPY;  Surgeon: [name], MD;  Location: [facility] ENDO SUITE;  Service: Endoscopy;  Laterality: N/A;  7:30am   ESOPHAGOGASTRODUODENOSCOPY N/A 10/17/2018   Procedure: ESOPHAGOGASTRODUODENOSCOPY (EGD);  Surgeon: [name], MD;  Location: [facility] ENDO SUITE;  Service: Endoscopy;  Laterality: N/A;  3:00pm   IR CT HEAD LTD  01/05/2021   IR CT HEAD LTD  01/05/2021   IR INTRA CRAN STENT  01/05/2021   IR PERCUTANEOUS ART THROMBECTOMY/INFUSION INTRACRANIAL INC DIAG ANGIO  01/05/2021   IR US GUIDE VASC ACCESS LEFT  01/05/2021   MALONEY DILATION N/A 10/17/2018   Procedure: Elease Hashimoto DILATION;  Surgeon: [name], MD;  Location: [facility] ENDO SUITE;  Service: Endoscopy;  Laterality: N/A;   RADIOLOGY WITH ANESTHESIA N/A 01/05/2021   Procedure: IR WITH ANESTHESIA;  Surgeon: Radiologist, Medication, MD;  Location: [facility] OR;  Service: Radiology;  Laterality: N/A;   TEE WITHOUT CARDIOVERSION N/A 09/14/2016   Procedure: TRANSESOPHAGEAL ECHOCARDIOGRAM (TEE) WITH PROPOFOL;  Surgeon: [name], MD;  Location: [facility] ORS;  Service: Cardiovascular;  Laterality: N/A;   TONSILECTOMY, ADENOIDECTOMY, BILATERAL MYRINGOTOMY AND TUBES      Allergies: Patient has no known allergies.  Medications: Prior to Admission medications   Medication Sig Start Date End Date Taking? Authorizing Provider  acetaminophen (TYLENOL) 500 MG tablet Take 500-1,000 mg by mouth every 6 (six) hours as needed for moderate pain or headache.   Yes [provider]  aspirin 81 MG EC tablet Take 1 tablet (81 mg total) by mouth daily. Patient taking differently: Take 81 mg by mouth at bedtime. 01/15/21  Yes Bailey-Modzik, Delila A, NP  Calcium Carb-Cholecalciferol (CALCIUM 600 + D PO) Take 1 tablet by mouth every evening.   Yes [provider]   Cholecalciferol (DIALYVITE VITAMIN D 5000) 125 MCG (5000 UT) capsule Take 5,000 Units by mouth every evening.   Yes [provider]  diltiazem (CARDIZEM CD) 180 MG 24 hr capsule TAKE 1 CAPSULE BY MOUTH  DAILY 03/12/21  Yes [name], MD  diltiazem (CARDIZEM) 60 MG tablet Take 30-60 mg by mouth daily as needed (afib).   Yes [provider]  ELIQUIS 5 MG TABS tablet TAKE ONE TABLET (  TOTAL) BY MOUTH TWOTIMES DAILY 09/26/20  Yes Fenton, Clint R, PA  escitalopram (LEXAPRO) 20 MG tablet Take 20 mg by mouth daily. 04/15/21  Yes [provider]  flecainide (TAMBOCOR) 100 MG tablet TAKE 1 TABLET BY MOUTH  TWICE DAILY 04/23/21  Yes [name], MD  levothyroxine (SYNTHROID) 50 MCG tablet Take 50 mcg by mouth daily at 2 am. 11/03/20  Yes [provider]  loratadine (CLARITIN) 10 MG tablet Take 10 mg by mouth daily as needed for allergies.   Yes [provider]  losartan (COZAAR) 25 MG tablet Take 25 mg by mouth every evening. 01/20/21  Yes [provider]  magnesium oxide (MAG-OX) 400 MG tablet Take 400 mg by mouth at bedtime.   Yes [provider]  metoprolol tartrate (LOPRESSOR) 25 MG tablet Take 1 tablet by mouth every 8 hours as needed for breakthrough afib Patient taking differently: Take 12.5 mg by mouth every 8 (eight) hours as needed (breakthrough afib). 08/07/20  Yes Fenton, Clint R, PA  Multiple Vitamins-Minerals (MULTIVITAMIN WITH MINERALS) tablet Take 1 tablet by mouth in the morning. Women's One A Day   Yes [provider]  Multiple Vitamins-Minerals (PRESERVISION AREDS 2) CAPS Take 1 capsule by mouth 2 (two) times daily.   Yes [provider]  Omega-3 Fatty Acids (FISH OIL ULTRA) 1400 MG CAPS Take 1,400 mg by mouth in the morning.   Yes [provider]  pantoprazole (PROTONIX) 40 MG tablet TAKE ONE TABLET (  TOTAL) BY MOUTH DAILY 05/02/21  Yes [name], PA-C  rosuvastatin (CRESTOR) 20 MG  tablet Take 20 mg by mouth in the morning. 01/20/21  Yes [provider]     Family History  Problem Relation Age of Onset   Coronary artery disease Father    Cancer Father        bladder cancer   Arrhythmia Sister        Reportedly had ablation of SVT   Hypertension Sister    Colon cancer Sister 34       s/p surgical resection, no chemo/radiation   Colon cancer Maternal Grandmother    Hypertension Son     Social History   Socioeconomic History   Marital status: Married    Spouse name: Not on file   Number of children: Not on file   Years of education: Not on file   Highest education level: Not on file  Occupational History   Occupation: Nurse    Comment: [employer]  Tobacco Use   Smoking status: Never   Smokeless tobacco: Never   Tobacco comments:    Never smoked  Vaping Use   Vaping Use: Never used  Substance and Sexual Activity  Alcohol use: Not Currently    Alcohol/week: 1.0 standard drink    Types: 1 Glasses of wine per week    Comment: occ wine   Drug use: No   Sexual activity: Yes    Birth control/protection: Surgical    Comment: hyst  Other Topics Concern   Not on file  Social History Narrative   Not on file   Social Determinants of Health   Financial Resource Strain: Low Risk    Difficulty of Paying Living Expenses: Not hard at all  Food Insecurity: No Food Insecurity   Worried About Programme researcher, broadcasting/film/video in the Last Year: Never true   Ran Out of Food in the Last Year: Never true  Transportation Needs: No Transportation Needs   Lack of Transportation (Medical): No   Lack of Transportation (Non-Medical): No  Physical Activity: Insufficiently Active   Days of Exercise per Week: 1 day   Minutes of Exercise per Session: 30 min  Stress: Stress Concern Present   Feeling of Stress : To some extent  Social Connections: Socially Integrated   Frequency of Communication with Friends and Family: Three times a week   Frequency of Social  Gatherings with Friends and Family: Twice a week   Attends Religious Services: More than 4 times per year   Active Member of [employer] or Organizations: Yes   Attends Engineer, structural: More than 4 times per year   Marital Status: Married    Review of Systems: A 12 point ROS discussed and pertinent positives are indicated in the HPI above.  All other systems are negative.  Review of Systems  Constitutional:  Negative for fatigue and fever.  HENT:  Negative for congestion.   Respiratory:  Negative for cough and shortness of breath.   Gastrointestinal:  Negative for abdominal pain, diarrhea, nausea and vomiting.   Vital Signs: BP (!) 149/88    Pulse (!) 51    Temp 98.4 F (36.9 C) (Oral)    Resp 17    Ht 5\' 6"  (1.676 m)    Wt 195 lb (88.5 kg)    SpO2 100%    BMI 31.47 kg/m   Physical Exam Vitals and nursing note reviewed.  Constitutional:      Appearance: She is well-developed.  HENT:     Head: Normocephalic and atraumatic.  Eyes:     Conjunctiva/sclera: Conjunctivae normal.  Cardiovascular:     Rate and Rhythm: Normal rate and regular rhythm.     Heart sounds: Normal heart sounds.  Pulmonary:     Effort: Pulmonary effort is normal.     Breath sounds: Normal breath sounds.  Musculoskeletal:        General: Normal range of motion.     Cervical back: Normal range of motion.  Skin:    General: Skin is warm.  Neurological:     Mental Status: She is alert and oriented to person, place, and time.    Imaging: No results found.  Labs:  CBC: Recent Labs    07/31/20 1122 01/05/21 1206 01/05/21 1222 01/28/21 0938 02/17/21 1017  WBC 9.3 6.1  --  8.7 8.6  HGB 14.8 14.1 13.9 14.6 15.1*  HCT 46.1* 42.0 41.0 45.0 45.5  PLT 318 226  --  254 255    COAGS: Recent Labs    01/05/21 1206  INR 1.2  APTT 29    BMP: Recent Labs    07/31/20 1122 01/05/21 1206 01/05/21 1222 01/28/21 0938 02/17/21 1017  NA  138 137 142 139 137  K 4.4 4.1 4.4 4.4 4.3  CL 106  105 106 108 105  CO2 25 25  --  25 27  GLUCOSE 95 89 92 100* 98  BUN 20 17 16 14 21   CALCIUM 9.6 9.2  --  9.6 9.7  CREATININE 0.88 0.85 0.90 0.98 0.95  GFRNONAA >60 >60  --  >60 >60    LIVER FUNCTION TESTS: Recent Labs    01/05/21 1206  BILITOT 0.6  AST 29  ALT 34  ALKPHOS 73  PROT 6.9  ALBUMIN 3.9     Assessment and Plan:  71 y.o. female outpatient. History of  a fib/ flutter s/p ablation  (on eliquis), HTN, macular degeneration  CVA ( 9.26.22). Patient under went a mechanical thrombectomy followed by stenting and angioplasty for treatment of a right P1/PCA occlusion with underlying proximal P2 segment stenosis by Dr. 12-31-1985 on 9.26.22. Patient presents for 6 month follow up cerebral angiogram for evaluation of the stents patency.   Patient is on ASA 81 mg and eliquis. All labs are within acceptable parameters. NKDA. Patient has been NPO since midnight.   Risks and benefits of cerebral angiogram were discussed with the patient including, but not limited to bleeding, infection, vascular injury or contrast induced renal failure.  This interventional procedure involves the use of X-rays and because of the nature of the planned procedure, it is possible that we will have prolonged use of X-ray fluoroscopy.  Potential radiation risks to you include (but are not limited to) the following: - A slightly elevated risk for cancer  several years later in life. This risk is typically less than 0.5% percent. This risk is low in comparison to the normal incidence of human cancer, which is 33% for women and 50% for men according to the American Cancer Society. - Radiation induced injury can include skin redness, resembling a rash, tissue breakdown / ulcers and hair loss (which can be temporary or permanent).   The likelihood of either of these occurring depends on the difficulty of the procedure and whether you are sensitive to radiation due to previous procedures, disease,  or genetic conditions.   IF your procedure requires a prolonged use of radiation, you will be notified and given written instructions for further action.  It is your responsibility to monitor the irradiated area for the 2 weeks following the procedure and to notify your physician if you are concerned that you have suffered a radiation induced injury.    All of the patient's questions were answered, patient is agreeable to proceed.  Consent signed and in chart.   Thank you for this interesting consult.  I greatly enjoyed meeting NEFTALI THUROW and look forward to participating in their care.  A copy of this report was sent to the requesting provider on this date.   Electronically Signed: Mora Appl, NP 06/16/2021, 6:56 AM   I spent a total of  30 Minutes   in face to face in clinical consultation, greater than 50% of which was counseling/coordinating care for cerebral stent patency evaluation.

## 2021-06-16 NOTE — Sedation Documentation (Signed)
This RN transported pt to short stay. Pt alert and resting in stretcher. NAD noted ?

## 2021-06-16 NOTE — Procedures (Signed)
INTERVENTIONAL NEURORADIOLOGY BRIEF POSTPROCEDURE NOTE ? ?DIAGNOSTIC CEREBRAL ANGIOGRAM  ? ?Attending: Dr. [name] ? ?Assistant: None.  ? ?Diagnosis: Right PCA stenosis status post stent placement.  ? ?Access site: Distal right radial artery.  ? ?Access closure: Inflatable band.  ? ?Anesthesia: Moderate sedation.  ? ?Medication used: 1 mg Versed IV; 25 mcg Fentanyl IV. ? ?Complications: None.  ? ?Estimated blood loss: Negative below.  ? ?Specimen: None.  ? ?Findings: Patent right PCA stent spanning the P1 and proximal P2 segment with approximately 60% stenosis at the distal third of the stent compared to 50% at the date of placement.  No flow limitation.  Mild atherosclerotic changes of the bilateral carotid bifurcation without hemodynamically significant stenosis.  No other significant finding.  ? ?The patient tolerated the procedure well without incident or complication and is in stable condition. ? ?Patient advised to continue on aspirin 81 mg q.d. in addition to Eliquis.  ? ? ?  ?

## 2021-06-25 ENCOUNTER — Inpatient Hospital Stay (HOSPITAL_COMMUNITY): Payer: Medicare Other | Attending: Hematology

## 2021-06-25 DIAGNOSIS — Z8673 Personal history of transient ischemic attack (TIA), and cerebral infarction without residual deficits: Secondary | ICD-10-CM | POA: Insufficient documentation

## 2021-06-25 DIAGNOSIS — Z7901 Long term (current) use of anticoagulants: Secondary | ICD-10-CM | POA: Diagnosis not present

## 2021-06-25 DIAGNOSIS — I4891 Unspecified atrial fibrillation: Secondary | ICD-10-CM | POA: Insufficient documentation

## 2021-06-25 DIAGNOSIS — Z79899 Other long term (current) drug therapy: Secondary | ICD-10-CM | POA: Diagnosis not present

## 2021-06-25 DIAGNOSIS — D6859 Other primary thrombophilia: Secondary | ICD-10-CM | POA: Diagnosis present

## 2021-06-25 DIAGNOSIS — Z7982 Long term (current) use of aspirin: Secondary | ICD-10-CM | POA: Diagnosis not present

## 2021-06-26 LAB — LUPUS ANTICOAGULANT PANEL
DRVVT: 68.8 s — ABNORMAL HIGH (ref 0.0–47.0)
PTT Lupus Anticoagulant: 36.4 s (ref 0.0–43.5)

## 2021-06-26 LAB — DRVVT CONFIRM: dRVVT Confirm: 1.2 ratio (ref 0.8–1.2)

## 2021-06-26 LAB — DRVVT MIX: dRVVT Mix: 53.6 s — ABNORMAL HIGH (ref 0.0–40.4)

## 2021-06-29 ENCOUNTER — Ambulatory Visit (HOSPITAL_COMMUNITY)
Admission: RE | Admit: 2021-06-29 | Discharge: 2021-06-29 | Disposition: A | Discharge status: Home / Self Care | Payer: Medicare Other | Source: Ambulatory Visit | Attending: Neuroradiology | Admitting: Neuroradiology

## 2021-06-29 ENCOUNTER — Other Ambulatory Visit: Payer: Self-pay

## 2021-06-29 DIAGNOSIS — I63431 Cerebral infarction due to embolism of right posterior cerebral artery: Secondary | ICD-10-CM | POA: Diagnosis present

## 2021-06-29 IMAGING — MR MR MRA HEAD W/O CM
1 series · 20 of 48 positions shown · non-contrast
Comparison: CT a [DATE]

CLINICAL DATA: Follow-up right PCA stent, establishing baseline.

EXAM:
MRA HEAD WITHOUT CONTRAST
TECHNIQUE: Angiographic images of the Circle of Willis were acquired using MRA
technique without intravenous contrast.

[Series 5: 3d cow · axial · 0.5mm · 0.41mm/px · z∈[-116,-36]mm · 20 of 172 slices shown]
[im 1/172]
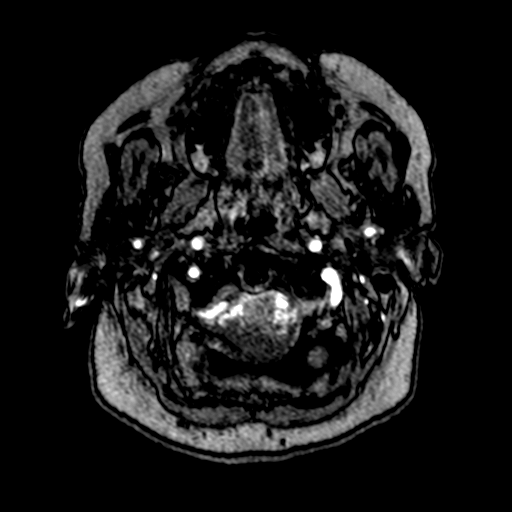
[im 4/172]
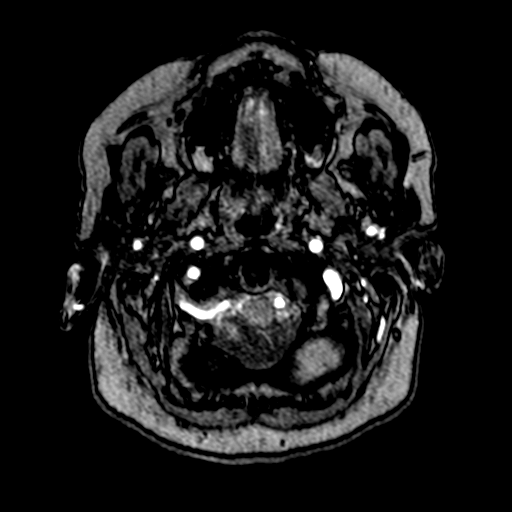
[im 8/172]
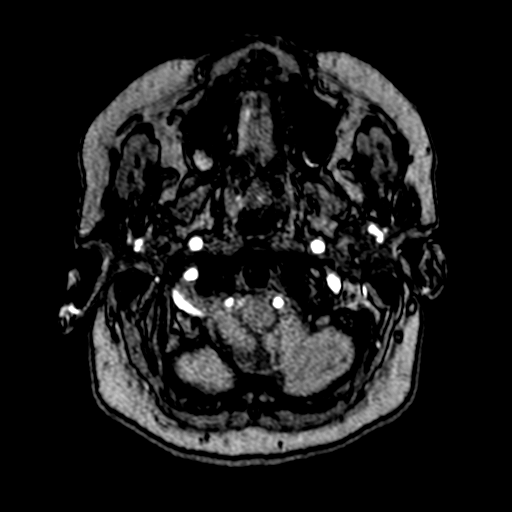
[im 11/172]
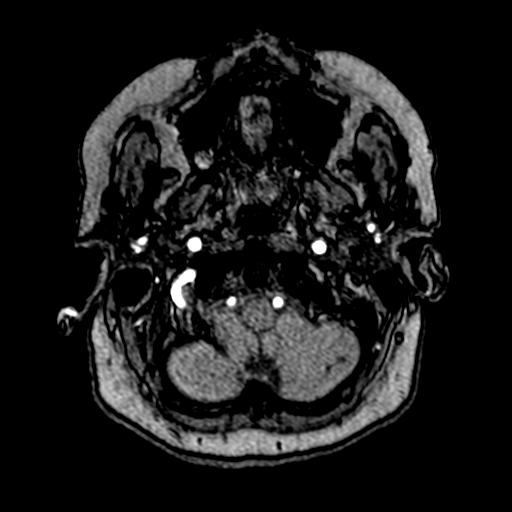
[im 15/172]
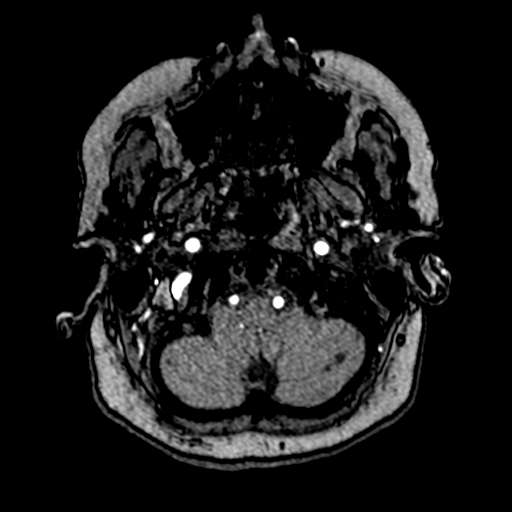
[im 19/172]
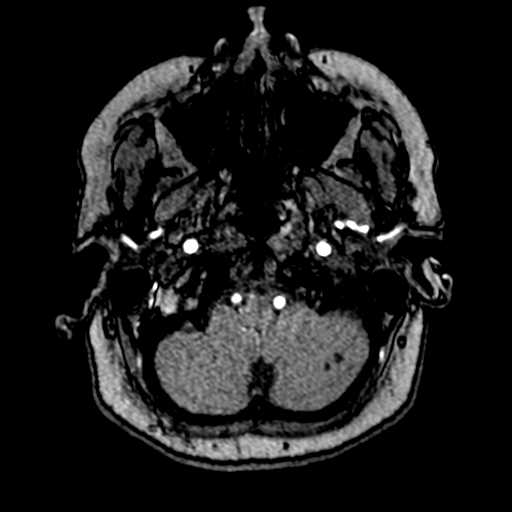
[im 22/172]
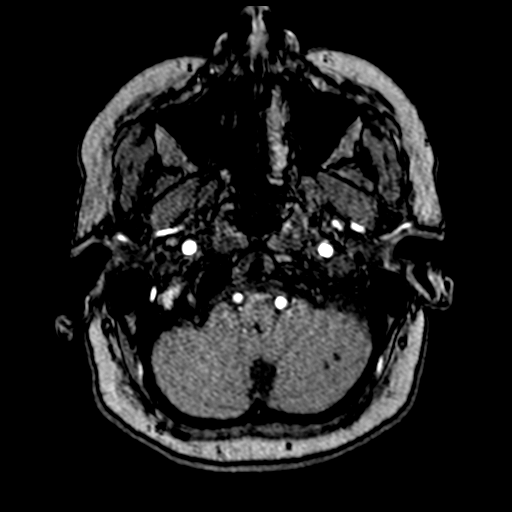
[im 26/172]
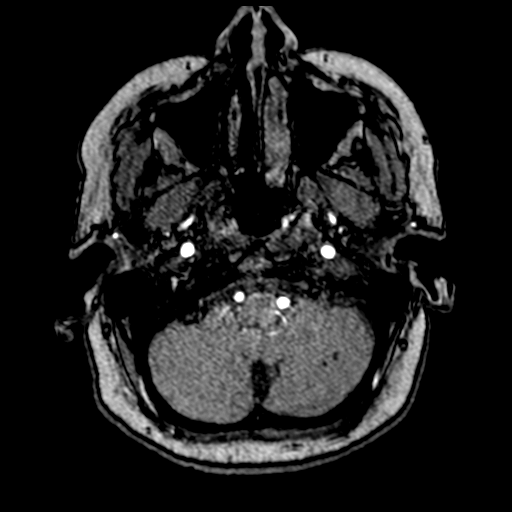
[im 30/172]
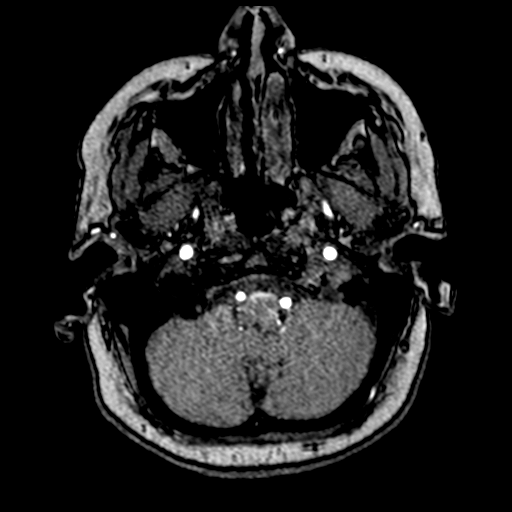
[im 33/172]
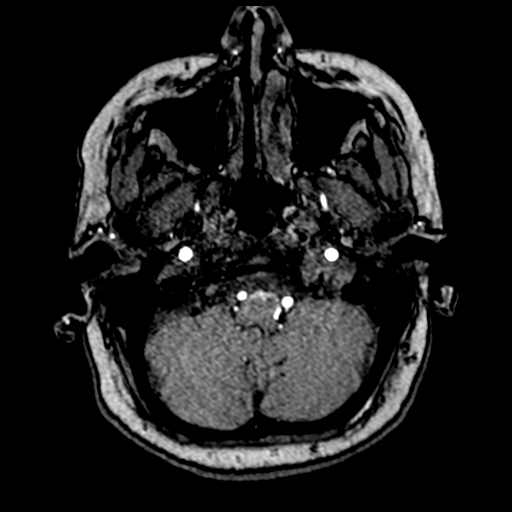
[im 37/172]
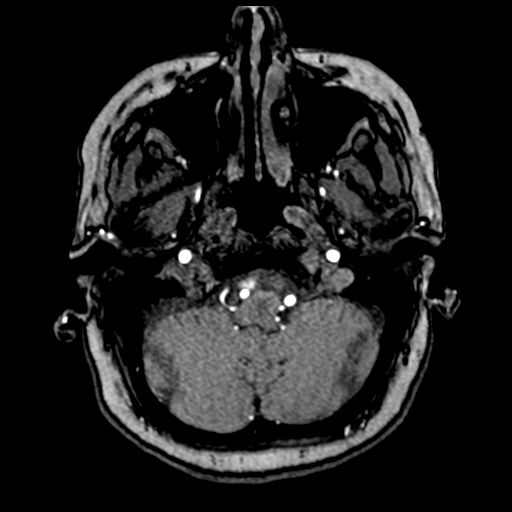
[im 41/172]
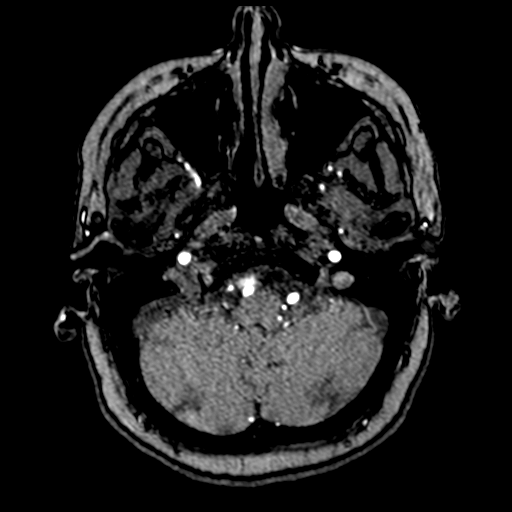
[im 55/172]
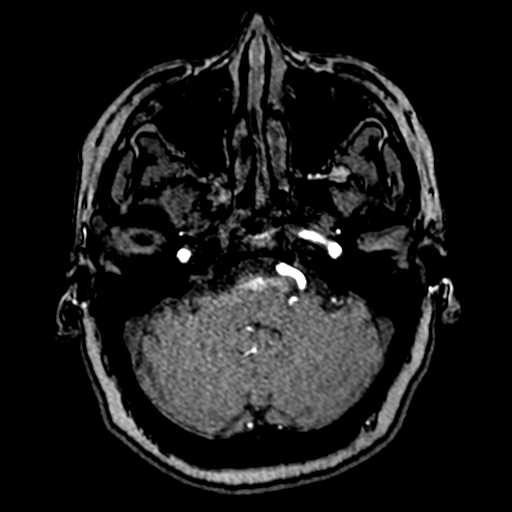
[im 77/172]
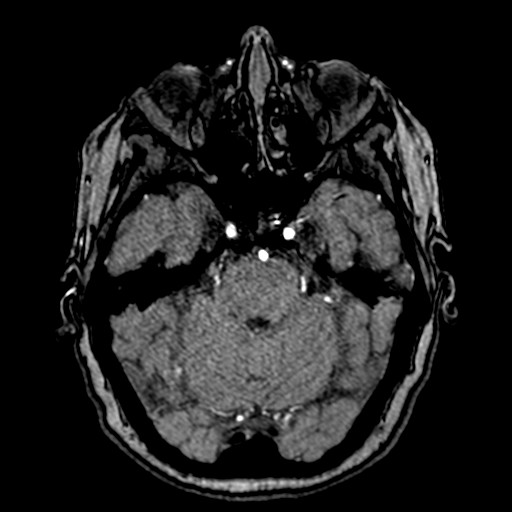
[im 88/172]
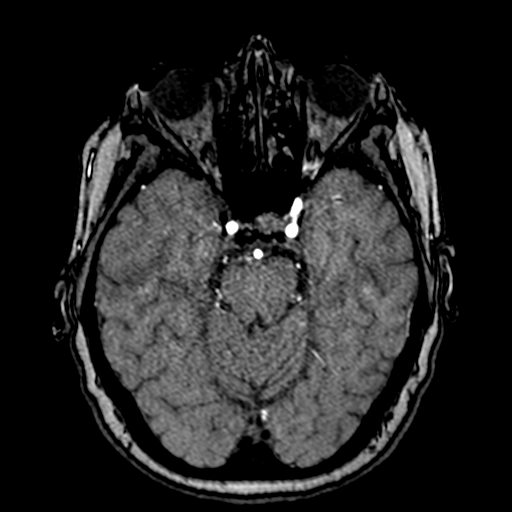
[im 99/172]
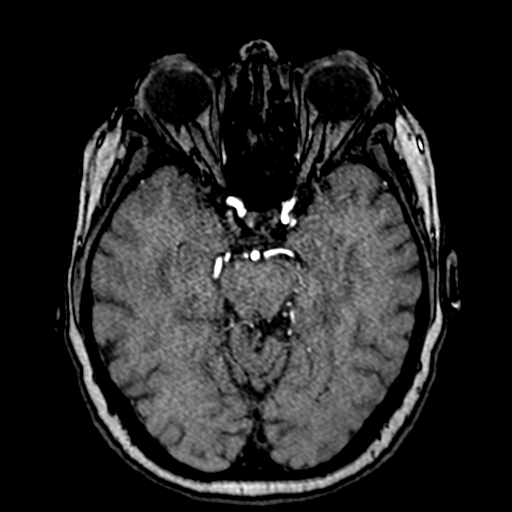
[im 121/172]
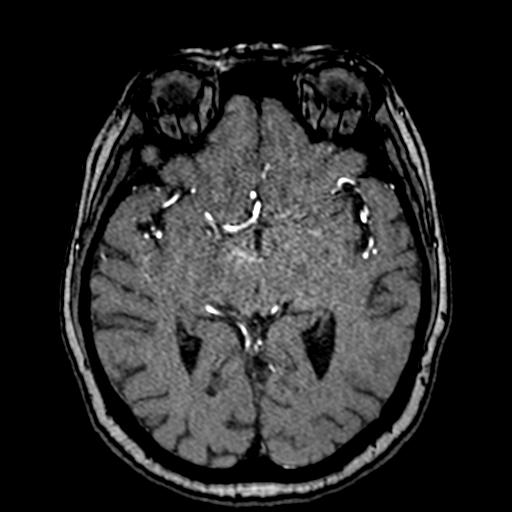
[im 142/172]
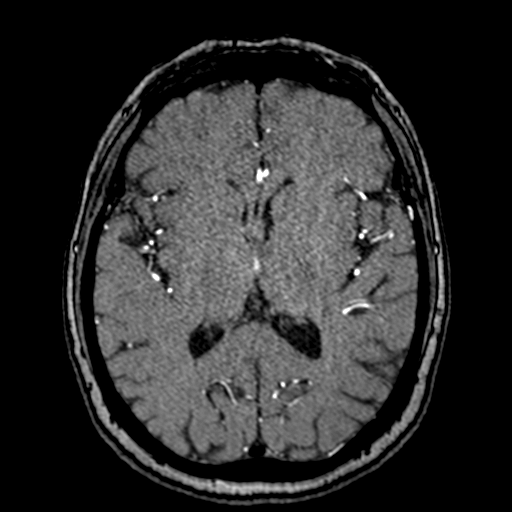
[im 146/172]
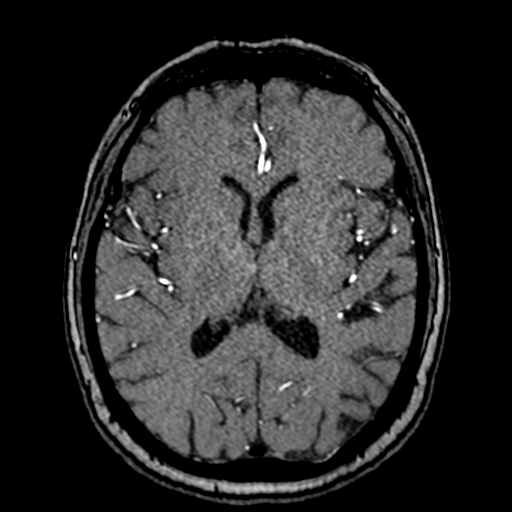
[im 164/172]
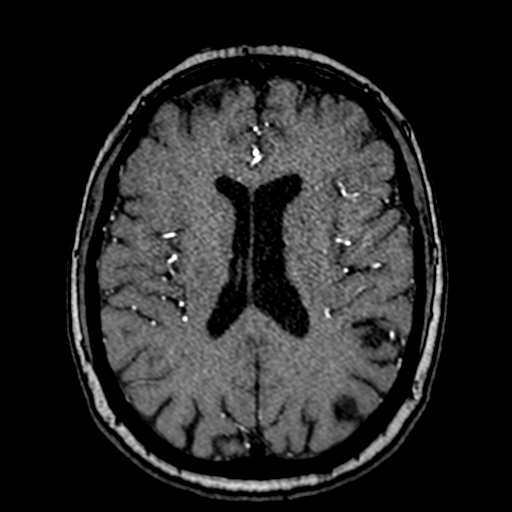

[20 of 48 positions shown; findings below may reference images not displayed]

FINDINGS: Anterior circulation: Vessels are smooth and diffusely patent.
Negative for aneurysm

Posterior circulation: The vertebral and basilar arteries are
smoothly contoured and diffusely patent. Artifact from right PCA
stent. Robust downstream flow which is symmetric to the left. No
downstream branch occlusion. Negative for aneurysm.

Anatomic variants: None significant
IMPRESSION: Baseline study with expected stent artifact at the proximal right
PCA.

No new abnormality.

## 2021-07-01 ENCOUNTER — Other Ambulatory Visit: Payer: Self-pay

## 2021-07-01 ENCOUNTER — Ambulatory Visit: Payer: Medicare Other | Admitting: Gastroenterology

## 2021-07-01 ENCOUNTER — Encounter: Payer: Self-pay | Admitting: Gastroenterology

## 2021-07-01 DIAGNOSIS — K219 Gastro-esophageal reflux disease without esophagitis: Secondary | ICD-10-CM | POA: Insufficient documentation

## 2021-07-01 MED ORDER — PANTOPRAZOLE SODIUM 40 MG PO TBEC: DELAYED_RELEASE_TABLET | ORAL | 3 refills | Status: DC

## 2021-07-01 NOTE — Patient Instructions (Addendum)
I have refilled the Protonix for you! ? ?Please call if worsening bleeding.  ? ?We will see you in 2 years! ? ?I enjoyed seeing you again today! As you know, I value our relationship and want to provide genuine, compassionate, and quality care. I welcome your feedback. If you receive a survey regarding your visit,  I greatly appreciate you taking time to fill this out. See you next time! ? ?Gelene Mink, PhD, ANP-BC ?[facility]  ? ?

## 2021-07-01 NOTE — Progress Notes (Signed)
Gastroenterology Office Note     Primary Care Physician:  Aliene Beams, MD  Primary Gastroenterologist: Dr. [name]    Chief Complaint   Chief Complaint  Patient presents with   Hemorrhoids    Bleeding on and off last six months.   Gastroesophageal Reflux    Needs refills, working well.      History of Present Illness   Renee Benitez is a 71 y.o. female presenting today in follow-up with a history of GERD and hemorrhoids and need for refills.   In interim from last appointment, she suffered a stroke (has been on Eliquis chronically with history of afib) and underwent emergent mechanical thrombectomy with subsequent stenting or cerebral artery. She is doing quite well now. On Plavix and Eliquis.  She notes GERD controlled as long as takes pantoprazole. No dysphagia. No abdominal pain, N/V, changes in bowel habits, constipation, diarrhea, unexplained weight loss, lack of appetite, unexplained weight gain. She does have low volume hematochezia occasionally if large stool. No prolapsing tissue or rectal pain. Known Grade 3 hemorrhoid with last colonoscopy 2021.      Past Medical History:  Diagnosis Date   Atrial fibrillation [facility])    Current use of estrogen therapy 01/31/2013   Dysrhythmia    AFib   Hypertension    Macular degeneration    Menopausal syndrome    Right ovarian cyst 01/25/2014    Past Surgical History:  Procedure Laterality Date   A-FLUTTER ABLATION N/A 03/18/2021   Procedure: A-FLUTTER ABLATION;  Surgeon: [name], MD;  Location: [facility] INVASIVE CV LAB;  Service: Cardiovascular;  Laterality: N/A;   ABDOMINAL HYSTERECTOMY     APPENDECTOMY     CARDIOVERSION N/A 09/14/2016   Procedure: CARDIOVERSION;  Surgeon: [name], MD;  Location: [facility] ORS;  Service: Cardiovascular;  Laterality: N/A;   CARDIOVERSION N/A 02/09/2021   Procedure: CARDIOVERSION;  Surgeon: [name], MD;  Location: [facility] ENDOSCOPY;  Service: Cardiovascular;   Laterality: N/A;   CATARACT EXTRACTION W/ INTRAOCULAR LENS  IMPLANT, BILATERAL Bilateral    COLONOSCOPY  12/04/2003   Friable anal canal otherwise normal rectum and colon   COLONOSCOPY N/A 03/19/2013   Dr. [name]: diverticulosis, hemorrhoids. next TCS 10 years   COLONOSCOPY N/A 05/02/2019   entire colon normal, non-bleeding external and internal hemorrhoids, grade 3. 5 year screening due to family history.   ESOPHAGOGASTRODUODENOSCOPY N/A 10/17/2018   moderately severe erosive esophagitis, esophagus status post dilation, small hiatal hernia, otherwise normal   IR ANGIO INTRA EXTRACRAN SEL INTERNAL CAROTID BILAT MOD SED  06/16/2021   IR ANGIO VERTEBRAL SEL VERTEBRAL UNI R MOD SED  06/16/2021   IR CT HEAD LTD  01/05/2021   IR CT HEAD LTD  01/05/2021   IR INTRA CRAN STENT  01/05/2021   IR PERCUTANEOUS ART THROMBECTOMY/INFUSION INTRACRANIAL INC DIAG ANGIO  01/05/2021   IR US GUIDE VASC ACCESS LEFT  01/05/2021   IR US GUIDE VASC ACCESS RIGHT  06/16/2021   MALONEY DILATION N/A 10/17/2018   Procedure: Elease Hashimoto DILATION;  Surgeon: [name], MD;  Location: [facility] ENDO SUITE;  Service: Endoscopy;  Laterality: N/A;   RADIOLOGY WITH ANESTHESIA N/A 01/05/2021   Procedure: IR WITH ANESTHESIA;  Surgeon: Radiologist, Medication, MD;  Location: [facility] OR;  Service: Radiology;  Laterality: N/A;   TEE WITHOUT CARDIOVERSION N/A 09/14/2016   Procedure: TRANSESOPHAGEAL ECHOCARDIOGRAM (TEE) WITH PROPOFOL;  Surgeon: [name], MD;  Location: [facility] ORS;  Service: Cardiovascular;  Laterality: N/A;  TONSILECTOMY, ADENOIDECTOMY, BILATERAL MYRINGOTOMY AND TUBES      Current Outpatient Medications  Medication Sig Dispense Refill   aspirin 81 MG EC tablet Take 1 tablet (81 mg total) by mouth daily. (Patient taking differently: Take 81 mg by mouth at bedtime.) 150 tablet 2   Calcium Carb-Cholecalciferol (CALCIUM 600 + D PO) Take 1 tablet by mouth every evening.     Cholecalciferol (DIALYVITE VITAMIN D 5000) 125  MCG (5000 UT) capsule Take 5,000 Units by mouth every evening.     diltiazem (CARDIZEM CD) 180 MG 24 hr capsule TAKE 1 CAPSULE BY MOUTH  DAILY 90 capsule 2   diltiazem (CARDIZEM) 60 MG tablet Take 30-60 mg by mouth daily as needed (afib).     ELIQUIS 5 MG TABS tablet TAKE ONE TABLET (5MG  TOTAL) BY MOUTH TWOTIMES DAILY 180 tablet 3   escitalopram (LEXAPRO) 20 MG tablet Take 20 mg by mouth daily.     flecainide (TAMBOCOR) 100 MG tablet TAKE 1 TABLET BY MOUTH  TWICE DAILY 240 tablet 2   levothyroxine (SYNTHROID) 50 MCG tablet Take 50 mcg by mouth daily at 2 am.     loratadine (CLARITIN) 10 MG tablet Take 10 mg by mouth daily as needed for allergies.     losartan (COZAAR) 25 MG tablet Take 25 mg by mouth every evening.     magnesium oxide (MAG-OX) 400 MG tablet Take 400 mg by mouth at bedtime.     metoprolol tartrate (LOPRESSOR) 25 MG tablet Take 1 tablet by mouth every 8 hours as needed for breakthrough afib (Patient taking differently: Take 12.5 mg by mouth every 8 (eight) hours as needed (breakthrough afib).) 60 tablet 3   Multiple Vitamins-Minerals (MULTIVITAMIN WITH MINERALS) tablet Take 1 tablet by mouth in the morning. Women's One A Day     Multiple Vitamins-Minerals (PRESERVISION AREDS 2) CAPS Take 1 capsule by mouth 2 (two) times daily.     Omega-3 Fatty Acids (FISH OIL ULTRA) 1400 MG CAPS Take 1,400 mg by mouth in the morning.     pantoprazole (PROTONIX) 40 MG tablet TAKE ONE TABLET (40MG  TOTAL) BY MOUTH DAILY 90 tablet 0   rosuvastatin (CRESTOR) 20 MG tablet Take 20 mg by mouth in the morning.     acetaminophen (TYLENOL) 500 MG tablet Take 500-1,000 mg by mouth every 6 (six) hours as needed for moderate pain or headache. (Patient not taking: Reported on 07/01/2021)     No current facility-administered medications for this visit.    Allergies as of 07/01/2021   (No Known Allergies)    Family History  Problem Relation Age of Onset   Coronary artery disease Father    Cancer Father         bladder cancer   Arrhythmia Sister        Reportedly had ablation of SVT   Hypertension Sister    Colon cancer Sister 75       s/p surgical resection, no chemo/radiation   Colon cancer Maternal Grandmother    Hypertension Son     Social History   Socioeconomic History   Marital status: Married    Spouse name: Not on file   Number of children: Not on file   Years of education: Not on file   Highest education level: Not on file  Occupational History   Occupation: Nurse    Comment: [employer]  Tobacco Use   Smoking status: Never   Smokeless tobacco: Never   Tobacco comments:    Never  smoked  Vaping Use   Vaping Use: Never used  Substance and Sexual Activity   Alcohol use: Not Currently    Alcohol/week: 1.0 standard drink    Types: 1 Glasses of wine per week    Comment: occ wine   Drug use: No   Sexual activity: Yes    Birth control/protection: Surgical    Comment: hyst  Other Topics Concern   Not on file  Social History Narrative   Not on file   Social Determinants of Health   Financial Resource Strain: Low Risk    Difficulty of Paying Living Expenses: Not hard at all  Food Insecurity: No Food Insecurity   Worried About Programme researcher, broadcasting/film/video in the Last Year: Never true   Ran Out of Food in the Last Year: Never true  Transportation Needs: No Transportation Needs   Lack of Transportation (Medical): No   Lack of Transportation (Non-Medical): No  Physical Activity: Insufficiently Active   Days of Exercise per Week: 1 day   Minutes of Exercise per Session: 30 min  Stress: Stress Concern Present   Feeling of Stress : To some extent  Social Connections: Socially Integrated   Frequency of Communication with Friends and Family: Three times a week   Frequency of Social Gatherings with Friends and Family: Twice a week   Attends Religious Services: More than 4 times per year   Active Member of [employer] or Organizations: Yes   Attends Engineer, structural:  More than 4 times per year   Marital Status: Married  Catering manager Violence: Not At Risk   Fear of Current or Ex-Partner: No   Emotionally Abused: No   Physically Abused: No   Sexually Abused: No     Review of Systems   Gen: Denies any fever, chills, fatigue, weight loss, lack of appetite.  CV: Denies chest pain, heart palpitations, peripheral edema, syncope.  Resp: Denies shortness of breath at rest or with exertion. Denies wheezing or cough.  GI: see HPI GU : Denies urinary burning, urinary frequency, urinary hesitancy MS: Denies joint pain, muscle weakness, cramps, or limitation of movement.  Derm: Denies rash, itching, dry skin Psych: Denies depression, anxiety, memory loss, and confusion Heme: Denies bruising, bleeding, and enlarged lymph nodes.   Physical Exam   BP 122/62 (BP Location: Right Arm, Patient Position: Sitting, Cuff Size: Large)   Pulse (!) 58   Temp (!) 97.5 F (36.4 C) (Temporal)   Ht 5\' 6"  (1.676 m)   Wt 191 lb 12.8 oz (87 kg)   SpO2 96%   BMI 30.96 kg/m  General:   Alert and oriented. Pleasant and cooperative. Well-nourished and well-developed.  Head:  Normocephalic and atraumatic. Eyes:  Without icterus Abdomen:  +BS, soft, non-tender and non-distended. No HSM noted. No guarding or rebound. No masses appreciated.  Rectal:  Deferred  Msk:  Symmetrical without gross deformities. Normal posture. Extremities:  Without edema. Neurologic:  Alert and  oriented x4;  grossly normal neurologically. Skin:  Intact without significant lesions or rashes. Psych:  Alert and cooperative. Normal mood and affect.   Assessment   Renee Benitez is a 71 y.o. female presenting today in follow-up with a history of GERD and rectal bleeding due to hemorrhoids.  GERD: well controlled on Protonix, which we will continue daily.  Rectal bleeding: known Grade 3 hemorrhoids with last colonoscopy 2021. High risk screening due in 2026 due to family history. Bleeding is  occasional and very low volume. She  would not be a good hemorrhoid banding candidate in light of antiplatelet/anticoagulation. Even if banding on this, if she were to have a post-banding bleed, she could have concerning consequences. Recommend supportive measures.    PLAN  Supplemental fiber daily Continue pantoprazole 2 year return Colonoscopy 2026 if benefits outweigh risks   Gelene Mink, PhD, [facility]

## 2021-07-02 ENCOUNTER — Ambulatory Visit (HOSPITAL_COMMUNITY): Payer: Medicare Other | Admitting: Hematology

## 2021-07-03 ENCOUNTER — Inpatient Hospital Stay (HOSPITAL_BASED_OUTPATIENT_CLINIC_OR_DEPARTMENT_OTHER): Payer: Medicare Other | Admitting: Hematology

## 2021-07-03 ENCOUNTER — Other Ambulatory Visit: Payer: Self-pay

## 2021-07-03 ENCOUNTER — Ambulatory Visit (HOSPITAL_COMMUNITY): Payer: Medicare Other | Admitting: Hematology

## 2021-07-03 VITALS — BP 136/83 | HR 61 | Temp 97.7°F | Resp 18 | Ht 66.0 in | Wt 191.6 lb

## 2021-07-03 DIAGNOSIS — D6869 Other thrombophilia: Secondary | ICD-10-CM | POA: Diagnosis not present

## 2021-07-03 DIAGNOSIS — D6859 Other primary thrombophilia: Secondary | ICD-10-CM | POA: Diagnosis not present

## 2021-07-03 NOTE — Progress Notes (Signed)
? ?[name] ?[address] ?[place], [place] [address] ? ? ?CLINIC:  ?Medical Oncology/Hematology ? ?PCP:  ?Caren Macadam, MD ?[address] / [place] [address]  ?[phone] ? ?REASON FOR VISIT:  ?Follow-up for hypercoagulable state ? ?PRIOR THERAPY: none ? ?CURRENT THERAPY:  5 mg Eliquis BID ? ?INTERVAL HISTORY:  ?[name], a 71 y.o. female, returns for routine follow-up for her hypercoagulable state. Aariyah was last seen on 02/23/2021. ? ?Today she reports feeling good. She denies CVA since last visit. She reports occasional mild rectal bleeding due to hemorrhoids. She is taking Eliquis and reports tolerating it well.  ? ?REVIEW OF SYSTEMS:  ?Review of Systems  ?Constitutional:  Negative for appetite change and fatigue.  ?Cardiovascular:  Chest pain: AFIB.  ?Gastrointestinal:  Positive for blood in stool (rectal bleeding - hemorrhoids).  ?All other systems reviewed and are negative. ? ?PAST MEDICAL/SURGICAL HISTORY:  ?Past Medical History:  ?Diagnosis Date  ? Atrial fibrillation ([place])   ? Current use of estrogen therapy 01/31/2013  ? Dysrhythmia   ? AFib  ? Hypertension   ? Macular degeneration   ? Menopausal syndrome   ? Right ovarian cyst 01/25/2014  ? ?Past Surgical History:  ?Procedure Laterality Date  ? A-FLUTTER ABLATION N/A 03/18/2021  ? Procedure: A-FLUTTER ABLATION;  Surgeon: Evans Lance, MD;  Location: [place] CV LAB;  Service: Cardiovascular;  Laterality: N/A;  ? ABDOMINAL HYSTERECTOMY    ? APPENDECTOMY    ? CARDIOVERSION N/A 09/14/2016  ? Procedure: CARDIOVERSION;  Surgeon: Satira Sark, MD;  Location: [facility] ORS;  Service: Cardiovascular;  Laterality: N/A;  ? CARDIOVERSION N/A 02/09/2021  ? Procedure: CARDIOVERSION;  Surgeon: Josue Hector, MD;  Location: [facility] ENDOSCOPY;  Service: Cardiovascular;  Laterality: N/A;  ? CATARACT EXTRACTION W/ INTRAOCULAR LENS  IMPLANT, BILATERAL Bilateral   ? COLONOSCOPY  12/04/2003  ? Friable anal canal otherwise normal rectum  and colon  ? COLONOSCOPY N/A 03/19/2013  ? Dr. Gala Romney: diverticulosis, hemorrhoids. next TCS 10 years  ? COLONOSCOPY N/A 05/02/2019  ? entire colon normal, non-bleeding external and internal hemorrhoids, grade 3. 5 year screening due to family history.  ? ESOPHAGOGASTRODUODENOSCOPY N/A 10/17/2018  ? moderately severe erosive esophagitis, esophagus status post dilation, small hiatal hernia, otherwise normal  ? IR ANGIO INTRA EXTRACRAN SEL INTERNAL CAROTID BILAT MOD SED  06/16/2021  ? IR ANGIO VERTEBRAL SEL VERTEBRAL UNI R MOD SED  06/16/2021  ? IR CT HEAD LTD  01/05/2021  ? IR CT HEAD LTD  01/05/2021  ? IR INTRA CRAN STENT  01/05/2021  ? IR PERCUTANEOUS ART THROMBECTOMY/INFUSION INTRACRANIAL INC DIAG ANGIO  01/05/2021  ? IR US GUIDE VASC ACCESS LEFT  01/05/2021  ? IR US GUIDE VASC ACCESS RIGHT  06/16/2021  ? MALONEY DILATION N/A 10/17/2018  ? Procedure: MALONEY DILATION;  Surgeon: Daneil Dolin, MD;  [facility] ENDO SUITE;  Service: Endoscopy;  Laterality: N/A;  ? RADIOLOGY WITH ANESTHESIA N/A 01/05/2021  ? Procedure: IR WITH ANESTHESIA;  Surgeon: Radiologist, Medication, MD;  Location: [place];  Service: Radiology;  Laterality: N/A;  ? TEE WITHOUT CARDIOVERSION N/A 09/14/2016  ? Procedure: TRANSESOPHAGEAL ECHOCARDIOGRAM (TEE) WITH PROPOFOL;  Surgeon: Satira Sark, MD;  Location: [facility] ORS;  Service: Cardiovascular;  Laterality: N/A;  ? TONSILECTOMY, ADENOIDECTOMY, BILATERAL MYRINGOTOMY AND TUBES    ? ? ?SOCIAL HISTORY:  ?Social History  ? ?Socioeconomic History  ? Marital status: Married  ?  Spouse name: Not on file  ?  Number of children: Not on file  ? Years of education: Not on file  ? Highest education level: Not on file  ?Occupational History  ? 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 Smoking status: Never  ? Smokeless tobacco: Never  ? Tobacco comments:  ?  Never smoked  ?Vaping Use  ? Vaping Use: Never used  ?Substance and Sexual Activity  ? Alcohol use: Not Currently  ?   Alcohol/week: 1.0 standard drink  ?  Types: 1 Glasses of wine per week  ?  Comment: occ wine  ? Drug use: No  ? Sexual activity: Yes  ?  Birth control/protection: Surgical  ?  Comment: hyst  ?Other Topics Concern  ? Not on file  ?Social History Narrative  ? Not on file  ? ?Social Determinants of Health  ? ?Financial Resource Strain: Low Risk   ? Difficulty of Paying Living Expenses: Not hard at all  ?Food Insecurity: No Food Insecurity  ? Worried About Charity fundraiser in the Last Year: Never true  ? Ran Out of Food in the Last Year: Never true  ?Transportation Needs: No Transportation Needs  ? Lack of Transportation (Medical): No  ? Lack of Transportation (Non-Medical): No  ?Physical Activity: Insufficiently Active  ? Days of Exercise per Week: 1 day  ? Minutes of Exercise per Session: 30 min  ?Stress: Stress Concern Present  ? Feeling of Stress : To some extent  ?Social Connections: Socially Integrated  ? Frequency of Communication with Friends and Family: Three times a week  ? Frequency of Social Gatherings with Friends and Family: Twice a week  ? Attends Religious Services: More than 4 times per year  ? Active Member of Clubs or Organizations: Yes  ? Attends Archivist Meetings: More than 4 times per year  ? Marital Status: Married  ?Intimate Partner Violence: Not At Risk  ? Fear of Current or Ex-Partner: No  ? Emotionally Abused: No  ? Physically Abused: No  ? Sexually Abused: No  ? ? ?FAMILY HISTORY:  ?Family History  ?Problem Relation Age of Onset  ? Coronary artery disease Father   ? Cancer Father   ?     bladder cancer  ? Arrhythmia Sister   ?     Reportedly had ablation of SVT  ? Hypertension Sister   ? Colon cancer Sister 26  ?     s/p surgical resection, no chemo/radiation  ? Colon cancer Maternal Grandmother   ? Hypertension Son   ? ? ?CURRENT MEDICATIONS:  ?Current Outpatient Medications  ?Medication Sig Dispense Refill  ? acetaminophen (TYLENOL) 500 MG tablet Take 500-1,000 mg by mouth  every 6 (six) hours as needed for moderate pain or headache. (Patient not taking: Reported on 07/01/2021)    ? aspirin 81 MG EC tablet Take 1 tablet (81 mg total) by mouth daily. (Patient taking differently: Take 81 mg by mouth at bedtime.) 150 tablet 2  ? Calcium Carb-Cholecalciferol (CALCIUM 600 + D PO) Take 1 tablet by mouth every evening.    ? Cholecalciferol (DIALYVITE VITAMIN D 5000) 125 MCG (5000 UT) capsule Take 5,000 Units by mouth every evening.    ? diltiazem (CARDIZEM CD) 180 MG 24 hr capsule TAKE 1 CAPSULE BY MOUTH  DAILY 90 capsule 2  ? diltiazem (CARDIZEM) 60 MG tablet Take 30-60 mg by mouth daily as needed (afib).    ? ELIQUIS 5 MG TABS tablet TAKE ONE TABLET (  5MG  TOTAL) BY MOUTH TWOTIMES DAILY 180 tablet 3  ? escitalopram (LEXAPRO) 20 MG tablet Take 20 mg by mouth daily.    ? flecainide (TAMBOCOR) 100 MG tablet TAKE 1 TABLET BY MOUTH  TWICE DAILY 240 tablet 2  ? levothyroxine (SYNTHROID) 50 MCG tablet Take 50 mcg by mouth daily at 2 am.    ? loratadine (CLARITIN) 10 MG tablet Take 10 mg by mouth daily as needed for allergies.    ? losartan (COZAAR) 25 MG tablet Take 25 mg by mouth every evening.    ? magnesium oxide (MAG-OX) 400 MG tablet Take 400 mg by mouth at bedtime.    ? metoprolol tartrate (LOPRESSOR) 25 MG tablet Take 1 tablet by mouth every 8 hours as needed for breakthrough afib (Patient taking differently: Take 12.5 mg by mouth every 8 (eight) hours as needed (breakthrough afib).) 60 tablet 3  ? Multiple Vitamins-Minerals (MULTIVITAMIN WITH MINERALS) tablet Take 1 tablet by mouth in the morning. Women's One A Day    ? Multiple Vitamins-Minerals (PRESERVISION AREDS 2) CAPS Take 1 capsule by mouth 2 (two) times daily.    ? Omega-3 Fatty Acids (FISH OIL ULTRA) 1400 MG CAPS Take 1,400 mg by mouth in the morning.    ? pantoprazole (PROTONIX) 40 MG tablet TAKE ONE TABLET (40MG  TOTAL) BY MOUTH DAILY 90 tablet 3  ? rosuvastatin (CRESTOR) 20 MG tablet Take 20 mg by mouth in the morning.    ? ?No  current facility-administered medications for this visit.  ? ? ?ALLERGIES:  ?No Known Allergies ? ?PHYSICAL EXAM:  ?Performance status (ECOG): 0 - Asymptomatic ? ?There were no vitals filed for this visit. ?Wt Ulyess Mort

## 2021-07-03 NOTE — Patient Instructions (Addendum)
[facility] at [facility] ?Discharge Instructions ? ? ?You were seen and examined today by Dr. Ellin Saba. ? ?He reviewed your lab test.  The lupus anticoagulant was negative. ? ?Return as needed.  ? ? ?Thank you for choosing [facility] at [facility] to provide your oncology and hematology care.  To afford each patient quality time with our provider, please arrive at least 15 minutes before your scheduled appointment time.  ? ?If you have a lab appointment with the Cancer Center please come in thru the Main Entrance and check in at the main information desk. ? ?You need to re-schedule your appointment should you arrive 10 or more minutes late.  We strive to give you quality time with our providers, and arriving late affects you and other patients whose appointments are after yours.  Also, if you no show three or more times for appointments you may be dismissed from the clinic at the providers discretion.     ?Again, thank you for choosing [facility]  Our hope is that these requests will decrease the amount of time that you wait before being seen by our physicians.       ?_____________________________________________________________ ? ?Should you have questions after your visit to [facility], please contact our office at [phone] and follow the prompts.  Our office hours are 8:00 a.m. and 4:30 p.m. Monday - Friday.  Please note that voicemails left after 4:00 p.m. may not be returned until the following business day.  We are closed weekends and major holidays.  You do have access to a nurse 24-7, just call the main number to the clinic [phone] and do not press any options, hold on the line and a nurse will answer the phone.   ? ?For prescription refill requests, have your pharmacy contact our office and allow 72 hours.   ? ?Due to Covid, you will need to wear a mask upon entering the hospital. If you do not have a mask, a mask will  be given to you at the Main Entrance upon arrival. For doctor visits, patients may have 1 support person age 12 or older with them. For treatment visits, patients can not have anyone with them due to social distancing guidelines and our immunocompromised population.  ? ?   ?

## 2021-07-13 ENCOUNTER — Telehealth: Payer: Self-pay | Admitting: *Deleted

## 2021-07-13 DIAGNOSIS — G4733 Obstructive sleep apnea (adult) (pediatric): Secondary | ICD-10-CM

## 2021-07-13 NOTE — Telephone Encounter (Signed)
4/3  Renee Benitez  ?Decision ID #:[id number] ?Prior Authorization is not required for the requested services ? ?

## 2021-07-27 ENCOUNTER — Other Ambulatory Visit (HOSPITAL_COMMUNITY): Payer: Self-pay | Admitting: *Deleted

## 2021-07-27 DIAGNOSIS — I480 Paroxysmal atrial fibrillation: Secondary | ICD-10-CM

## 2021-07-27 NOTE — Telephone Encounter (Signed)
Pt prefers home sleep study instead of split night. Will forward for scheduling. ?

## 2021-07-30 NOTE — Telephone Encounter (Signed)
Home study ordered NO PA REQUIRED. ?

## 2021-08-17 ENCOUNTER — Ambulatory Visit: Payer: Medicare Other | Attending: Physician Assistant | Admitting: Cardiology

## 2021-08-17 DIAGNOSIS — I480 Paroxysmal atrial fibrillation: Secondary | ICD-10-CM | POA: Diagnosis not present

## 2021-08-17 DIAGNOSIS — G4733 Obstructive sleep apnea (adult) (pediatric): Secondary | ICD-10-CM | POA: Diagnosis present

## 2021-08-17 DIAGNOSIS — G4734 Idiopathic sleep related nonobstructive alveolar hypoventilation: Secondary | ICD-10-CM | POA: Diagnosis not present

## 2021-08-17 DIAGNOSIS — G4736 Sleep related hypoventilation in conditions classified elsewhere: Secondary | ICD-10-CM | POA: Diagnosis not present

## 2021-08-22 NOTE — Procedures (Signed)
? ?  Patient Name: Renee Benitez, [name] ?Study Date: 08/03/2021 ?Gender: Female ?D.O.B: [birth year] ?Age (years): 24 ?Referring Provider: Malka So PA ?Height (inches): 66 ?Interpreting Physician: Fransico Him MD, ABSM ?Weight (lbs): 191 ?RPSGT: Rosebud Poles ?BMI: 31 ?MRN: [id number] ? ?CLINICAL INFORMATION ?Sleep Study Type: HST ? ?Indication for sleep study: N/A ? ?Epworth Sleepiness Score: N/A ? ?SLEEP STUDY TECHNIQUE ?A multi-channel overnight portable sleep study was performed. The channels recorded were: nasal airflow, thoracic respiratory movement, and oxygen saturation with a pulse oximetry. Snoring was also monitored. ? ?MEDICATIONS ?Patient self administered medications include: N/A. ? ?SLEEP ARCHITECTURE ?Patient was studied for 451.9 minutes. The sleep efficiency was 94.2 % and the patient was supine for 78.1%. The arousal index was 0.0 per hour. ? ?RESPIRATORY PARAMETERS ?The overall AHI was 8.1 per hour, with a central apnea index of 0 per hour. ? ?The oxygen nadir was 75% during sleep. ? ?CARDIAC DATA ?Mean heart rate during sleep was 56.0 bpm. ? ?IMPRESSIONS ?- Mild obstructive sleep apnea occurred during this study (AHI = 8.1/h). ?- Severe oxygen desaturation was noted during this study (Min O2 = 75%). ?- Patient snored 41.2% during the sleep. ? ?DIAGNOSIS ?- Obstructive Sleep Apnea (G47.33) ?- Nocturnal Hypoxemia (G47.36) ? ?RECOMMENDATIONS ?- Therapeutic CPAP titration to determine optimal pressure required to alleviate sleep disordered breathing. ?- Avoid alcohol, sedatives and other CNS depressants that may worsen sleep apnea and disrupt normal sleep architecture. ?- Sleep hygiene should be reviewed to assess factors that may improve sleep quality. ?- Weight management and regular exercise should be initiated or continued. ? ?[Electronically signed] 08/22/2021 12:10 PM ? ?Fransico Him MD, ABSM ?Diplomate, Tax adviser of Sleep Medicine ?

## 2021-08-31 ENCOUNTER — Other Ambulatory Visit (HOSPITAL_COMMUNITY): Payer: Self-pay | Admitting: *Deleted

## 2021-08-31 DIAGNOSIS — R0681 Apnea, not elsewhere classified: Secondary | ICD-10-CM

## 2021-08-31 DIAGNOSIS — M79605 Pain in left leg: Secondary | ICD-10-CM | POA: Insufficient documentation

## 2021-08-31 DIAGNOSIS — M79604 Pain in right leg: Secondary | ICD-10-CM | POA: Insufficient documentation

## 2021-09-01 ENCOUNTER — Other Ambulatory Visit (HOSPITAL_COMMUNITY): Payer: Self-pay | Admitting: *Deleted

## 2021-09-01 NOTE — Telephone Encounter (Signed)
The patient has been notified of the result and verbalized understanding.  All questions (if any) were answered. Latrelle Dodrill, CMA 09/01/2021 5:09 PM    Will precert titration

## 2021-09-01 NOTE — Addendum Note (Signed)
Addended by: Freada Bergeron on: 09/01/2021 05:05 PM   Modules accepted: Orders

## 2021-09-09 ENCOUNTER — Telehealth: Payer: Self-pay | Admitting: *Deleted

## 2021-09-09 NOTE — Telephone Encounter (Signed)
-----   Message from Gaynelle Cage, [place] sent at 08/24/2021  9:04 AM EDT -----  ----- Message ----- From: Quintella Reichert, MD Sent: 08/22/2021  12:12 PM EDT To: Cv Div Sleep Studies  Please let patient know that they have sleep apnea.  Recommend therapeutic CPAP titration for treatment of patient's sleep disordered breathing.  If unable to perform an in lab titration then initiate ResMed auto CPAP from 4 to 15cm H2O with heated humidity and mask of choice and overnight pulse ox on CPAP.

## 2021-09-09 NOTE — Telephone Encounter (Signed)
The patient has been notified of the result and verbalized understanding.  All questions (if any) were answered. Latrelle Dodrill, CMA 09/09/2021 6:51 PM    Precert titration

## 2021-09-23 ENCOUNTER — Ambulatory Visit: Payer: Medicare Other | Admitting: Neurology

## 2021-09-23 ENCOUNTER — Encounter: Payer: Self-pay | Admitting: Neurology

## 2021-09-23 VITALS — BP 132/67 | HR 53 | Ht 66.5 in | Wt 190.6 lb

## 2021-09-23 DIAGNOSIS — Z95820 Peripheral vascular angioplasty status with implants and grafts: Secondary | ICD-10-CM | POA: Diagnosis not present

## 2021-09-23 DIAGNOSIS — Z8673 Personal history of transient ischemic attack (TIA), and cerebral infarction without residual deficits: Secondary | ICD-10-CM

## 2021-09-23 NOTE — Patient Instructions (Signed)
I had a long d/w patient about her  recent stroke, risk for recurrent stroke/TIAs, personally independently reviewed imaging studies and stroke evaluation results and answered questions.Continue aspirin 81 mg daily for intracranial stent restenosis and Eliquis for atrial fibrillation for secondary stroke prevention and maintain strict control of hypertension with blood pressure goal below 130/90, diabetes with hemoglobin A1c goal below 6.5% and lipids with LDL cholesterol goal below 70 mg/dL. I also advised the patient to eat a healthy diet with plenty of whole grains, cereals, fruits and vegetables, exercise regularly and maintain ideal body weight Followup in the future with me in 1 year or call earlier if necessary.

## 2021-09-23 NOTE — Progress Notes (Signed)
[facility] [address] [place] [address] [phone]       OFFICE FOLLOW-UP NOTE  [name] Date of Birth:  07-25-1950 Medical Record Number:  [id number]   HPI: Initial visit 03/25/2021 :Renee Benitez is a pleasant 71 year old Caucasian lady seen today for initial office follow-up visit following hospital admission for stroke and September 2022.  She is accompanied by her husband.  History is obtained from them and review of electronic medical records and I personally reviewed available pertinent imaging films in PACS.  She has past medical history of atrial fibrillation on long-term anticoagulation with Eliquis, hypertension, macular degeneration who presented on 01/05/2021 to [facility] with left-sided weakness.  She had gone to a bank and when leaving the bank noticed the symptoms.  She had a 20 point hospital and was evaluated by telemetry neurologist and found to have an extra-axial of 15.  CT angiogram demonstrated right posterior cerebral artery occlusion in the P1 segment.  Patient was on Eliquis and hence did not get thrombolysis but was transferred to [facility] for emergent mechanical thrombectomy which was successfully performed but patient required rescue angioplasty stenting of the right posterior cerebral artery by Dr. Norma Fredrickson.  Patient did well she was admitted to the ICU blood pressure is tightly controlled.  2D echo showed ejection fraction greater than 75% with moderate left ventricular hypertrophy and grade 2 diastolic dysfunction.  Left atrium size was mildly dilated.  LDL cholesterol was elevated at 131 mg percent hemoglobin A1c was 5.5.  MRI scan showed multiple small foci of acute infarcts in both hemispheres largest one being in the right basal ganglia and thalamus.  There is old left cerebellar infarct also noted with changes of small vessel disease.  Patient was discharged home and has done well.  She is finished  therapies.  She has no therapy needs.  She is back to doing all activities of daily living.  She does get tired a little bit but this is from her A. fib.  She has undergone cardioversion unsuccessfully for A. fib and recently underwent ablation by Dr. Lovena Le.  She is tolerating Eliquis well without bleeding and only minor bruising.  She is tolerating Crestor without muscle aches and pains.  Her blood pressure is under good control today it is 125/71.  She plans to have follow-up lab work done at upcoming primary care physician visit next month.  She also has an appointment to see Dr. Norma Fredrickson in a few months time.  She has no new complaints today.  Patient admits she may have missed 1 dose of Eliquis prior to developing a stroke symptoms. Update 09/23/2021: She returns for follow-up after last visit 6 months ago.  She states she is doing well.  She has had no recurrent stroke or TIA symptoms.  She is tolerating Eliquis t and aspirin but does bruise very easily.  She has had no bleeding.  She recently bumped her head when something fell out of her over her daughter and hit it.  She is also referred recovering from a urinary tract infection for which she is on Macrodantin.  She states she is done well from stroke standpoint and made a full recovery.  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  She did undergo lab work at last visit and LDL cholesterol was optimal at 66 mg percent on 03/15/2021.  She had follow-up lupus anticoagulant checked which was negative on 06/26/2011.  She underwent follow-up diagnostic cerebral catheter angiogram on 06/16/2021 by Dr. Sherlon Handing which showed 60% stenosis in the right PCA stent and conservative medical management was recommended.  She continues to have mild short-term memory difficulties which are unchanged.  She actually did quite well in the office today and scored 3/3 on recall, able to name 15 animals and scored 4/4 on clock drawing. ROS:   14  system review of systems is positive for weakness , memory loss, bruising all other systems negative  PMH:  Past Medical History:  Diagnosis Date   Atrial fibrillation (HCC)    Current use of estrogen therapy 01/31/2013   Dysrhythmia    AFib   Hypertension    Macular degeneration    Menopausal syndrome    Right ovarian cyst 01/25/2014    Social History:  Social History   Socioeconomic History   Marital status: Married    Spouse name: Not on file   Number of children: Not on file   Years of education: Not on file   Highest education level: Not on file  Occupational History   Occupation: Nurse    Comment: Copywriter, advertising  Tobacco Use   Smoking status: Never   Smokeless tobacco: Never   Tobacco comments:    Never smoked  Vaping Use   Vaping Use: Never used  Substance and Sexual Activity   Alcohol use: Not Currently    Alcohol/week: 1.0 standard drink of alcohol    Types: 1 Glasses of wine per week    Comment: occ wine   Drug use: No   Sexual activity: Yes    Birth control/protection: Surgical    Comment: hyst  Other Topics Concern   Not on file  Social History Narrative   Not on file   Social Determinants of Health   Financial Resource Strain: Low Risk  (07/16/2020)   Overall Financial Resource Strain (CARDIA)    Difficulty of Paying Living Expenses: Not hard at all  Food Insecurity: No Food Insecurity (07/16/2020)   Hunger Vital Sign    Worried About Running Out of Food in the Last Year: Never true    Ran Out of Food in the Last Year: Never true  Transportation Needs: No Transportation Needs (07/16/2020)   PRAPARE - Administrator, Civil Service (Medical): No    Lack of Transportation (Non-Medical): No  Physical Activity: Insufficiently Active (07/16/2020)   Exercise Vital Sign    Days of Exercise per Week: 1 day    Minutes of Exercise per Session: 30 min  Stress: Stress Concern Present (07/16/2020)   [employer] of Occupational Health -  Occupational Stress Questionnaire    Feeling of Stress : To some extent  Social Connections: Socially Integrated (07/16/2020)   Social Connection and Isolation Panel [NHANES]    Frequency of Communication with Friends and Family: Three times a week    Frequency of Social Gatherings with Friends and Family: Twice a week    Attends Religious Services: More than 4 times per year    Active Member of [employer] or Organizations: Yes    Attends Engineer, structural: More than 4 times per year    Marital Status: Married  Catering manager Violence: Not At Risk (07/16/2020)   Humiliation, Afraid, Rape, and Kick questionnaire    Fear of Current or Ex-Partner: No    Emotionally Abused: No    Physically Abused: No    Sexually Abused: No    Medications:   Current  Outpatient Medications on File Prior to Visit  Medication Sig Dispense Refill   acetaminophen (TYLENOL) 500 MG tablet Take 500-1,000 mg by mouth every 6 (six) hours as needed for moderate pain or headache.     aspirin 81 MG EC tablet Take 1 tablet (81 mg total) by mouth daily. (Patient taking differently: Take 81 mg by mouth at bedtime.) 150 tablet 2   Calcium Carb-Cholecalciferol (CALCIUM 600 + D PO) Take 1 tablet by mouth every evening.     Cholecalciferol (DIALYVITE VITAMIN D 5000) 125 MCG (5000 UT) capsule Take 5,000 Units by mouth every evening.     diltiazem (CARDIZEM CD) 180 MG 24 hr capsule TAKE 1 CAPSULE BY MOUTH  DAILY 90 capsule 2   diltiazem (CARDIZEM) 60 MG tablet Take 30-60 mg by mouth daily as needed (afib).     ELIQUIS 5 MG TABS tablet TAKE ONE TABLET (5MG  TOTAL) BY MOUTH TWOTIMES DAILY 180 tablet 3   escitalopram (LEXAPRO) 20 MG tablet Take 20 mg by mouth daily.     flecainide (TAMBOCOR) 100 MG tablet TAKE 1 TABLET BY MOUTH  TWICE DAILY 240 tablet 2   levothyroxine (SYNTHROID) 50 MCG tablet Take 50 mcg by mouth daily at 2 am.     loratadine (CLARITIN) 10 MG tablet Take 10 mg by mouth daily as needed for allergies.      losartan (COZAAR) 25 MG tablet Take 25 mg by mouth every evening.     magnesium oxide (MAG-OX) 400 MG tablet Take 400 mg by mouth at bedtime.     metoprolol tartrate (LOPRESSOR) 25 MG tablet Take 1 tablet by mouth every 8 hours as needed for breakthrough afib (Patient taking differently: Take 12.5 mg by mouth every 8 (eight) hours as needed (breakthrough afib).) 60 tablet 3   Multiple Vitamins-Minerals (MULTIVITAMIN WITH MINERALS) tablet Take 1 tablet by mouth in the morning. Women's One A Day     Multiple Vitamins-Minerals (PRESERVISION AREDS 2) CAPS Take 1 capsule by mouth 2 (two) times daily.     nitrofurantoin, macrocrystal-monohydrate, (MACROBID) 100 MG capsule Take 100 mg by mouth 2 (two) times daily.     Omega-3 Fatty Acids (FISH OIL ULTRA) 1400 MG CAPS Take 1,400 mg by mouth in the morning.     pantoprazole (PROTONIX) 40 MG tablet TAKE ONE TABLET (40MG  TOTAL) BY MOUTH DAILY 90 tablet 3   rosuvastatin (CRESTOR) 20 MG tablet Take 20 mg by mouth in the morning.     No current facility-administered medications on file prior to visit.    Allergies:  No Known Allergies  Physical Exam General: Pleasant middle-aged Caucasian lady, seated, in no evident distress Head: head normocephalic and atraumatic.  Neck: supple with no carotid or supraclavicular bruits Cardiovascular: regular rate and rhythm, no murmurs Musculoskeletal: no deformity Skin:  no rash/petichiae Vascular:  Normal pulses all extremities Vitals:   09/23/21 1341  BP: 132/67  Pulse: (!) 53   Neurologic Exam Mental Status: Awake and fully alert. Oriented to place and time. Recent and remote memory intact. Attention span, concentration and fund of knowledge appropriate. Mood and affect appropriate.  Cranial Nerves: Fundoscopic exam not done s. Pupils equal, briskly reactive to light. Extraocular movements full without nystagmus. Visual fields full to confrontation. Hearing intact. Facial sensation intact. Face, tongue, palate  moves normally and symmetrically.  Motor: Normal bulk and tone. Normal strength in all tested extremity muscles. Sensory.: intact to touch ,pinprick .position and vibratory sensation.  Coordination: Rapid alternating movements normal in all extremities.  Finger-to-nose and heel-to-shin performed accurately bilaterally. Gait and Station: Arises from chair without difficulty. Stance is normal. Gait demonstrates normal stride length and balance . Able to heel, toe and tandem walk with moderate difficulty.  Reflexes: 1+ and symmetric. Toes downgoing.       ASSESSMENT: 71 year old Caucasian lady with right basal ganglia and thalamic infarcts secondary to right PCA occlusion in September 2022 from atrial fibrillation treated with successful mechanical thrombectomy requiring rescue angioplasty and stenting of the right P1 PCA.  Vascular risk factors of atrial fibrillation, hypertension and hyperlipidemia.  She also has mild cognitive impairment which appears stable     PLAN:I had a long d/w patient about her  recent stroke, risk for recurrent stroke/TIAs, personally independently reviewed imaging studies and stroke evaluation results and answered questions.Continue aspirin 81 mg daily for intracranial stent restenosis and Eliquis for atrial fibrillation for secondary stroke prevention and maintain strict control of hypertension with blood pressure goal below 130/90, diabetes with hemoglobin A1c goal below 6.5% and lipids with LDL cholesterol goal below 70 mg/dL. I also advised the patient to eat a healthy diet with plenty of whole grains, cereals, fruits and vegetables, exercise regularly and maintain ideal body weight Followup in the future with me in 1 year or call earlier if necessary.  She was also advised to increase participation in cognitively challenging activities like solving crossword puzzles, playing bridge and sudoku.  We also discussed memory compensation strategies..Greater than 50% of time  during this 35 minute visit was spent on counseling,explanation of diagnosis, planning of further management, discussion with patient and family and coordination of care Antony Contras, MD Note: This document was prepared with digital dictation and possible smart phrase technology. Any transcriptional errors that result from this process are unintentional

## 2021-09-23 NOTE — Telephone Encounter (Signed)
Prior Authorization for TITRATION sent to [facility] via web portal. 6/14   APPROVED-TT READ-Prior Authorization is not required for the requested services-Decision ID #:[id number]- EXPECTED FROM DATE*  10/10/2021 EXPECTED TO DATE*  11/10/2021

## 2021-09-25 ENCOUNTER — Other Ambulatory Visit (HOSPITAL_COMMUNITY): Payer: Self-pay | Admitting: Physician Assistant

## 2021-10-07 ENCOUNTER — Encounter: Payer: Self-pay | Admitting: Internal Medicine

## 2021-10-07 ENCOUNTER — Ambulatory Visit: Payer: Medicare Other | Admitting: Internal Medicine

## 2021-10-07 VITALS — BP 122/70 | HR 63 | Ht 66.5 in | Wt 189.2 lb

## 2021-10-07 DIAGNOSIS — G4733 Obstructive sleep apnea (adult) (pediatric): Secondary | ICD-10-CM

## 2021-10-07 DIAGNOSIS — I480 Paroxysmal atrial fibrillation: Secondary | ICD-10-CM | POA: Diagnosis not present

## 2021-10-07 MED ORDER — DILTIAZEM HCL 60 MG PO TABS: 30.0000 mg | ORAL_TABLET | Freq: Every day | ORAL | 3 refills | Status: DC | PRN

## 2021-10-07 MED ORDER — FLECAINIDE ACETATE 100 MG PO TABS: 100.0000 mg | ORAL_TABLET | Freq: Two times a day (BID) | ORAL | 3 refills | Status: DC

## 2021-10-07 MED ORDER — DILTIAZEM HCL ER COATED BEADS 180 MG PO CP24: 180.0000 mg | ORAL_CAPSULE | Freq: Every day | ORAL | 3 refills | Status: DC

## 2021-10-07 NOTE — Patient Instructions (Signed)
Medication Instructions:  Your physician recommends that you continue on your current medications as directed. Please refer to the Current Medication list given to you today.  *If you need a refill on your cardiac medications before your next appointment, please call your pharmacy*   Lab Work: NONE   If you have labs (blood work) drawn today and your tests are completely normal, you will receive your results only by: MyChart Message (if you have MyChart) OR A paper copy in the mail If you have any lab test that is abnormal or we need to change your treatment, we will call you to review the results.   Testing/Procedures: NONE    Follow-Up: At [facility], you and your health needs are our priority.  As part of our continuing mission to provide you with exceptional heart care, we have created designated Provider Care Teams.  These Care Teams include your primary Cardiologist (physician) and Advanced Practice Providers (APPs -  Physician Assistants and Nurse Practitioners) who all work together to provide you with the care you need, when you need it.  We recommend signing up for the patient portal called "MyChart".  Sign up information is provided on this After Visit Summary.  MyChart is used to connect with patients for Virtual Visits (Telemedicine).  Patients are able to view lab/test results, encounter notes, upcoming appointments, etc.  Non-urgent messages can be sent to your provider as well.   To learn more about what you can do with MyChart, go to [email or web address].    Your next appointment:   1 year(s)  The format for your next appointment:   In Person  Provider:   Gregg Taylor, MD    Other Instructions Thank you for choosing [facility]!    Important Information About Sugar       

## 2021-10-07 NOTE — Progress Notes (Signed)
HPI Renee Benitez returns today for followup. She is a pleasant 71 yo woman with a h/o atrial fib who then developed atrial flutter on flecainide. She underwent EP study where she was found to have both right and left atrial flutter and both were successfully ablated. She has had very rare symptoms in the interim. She admits to some dietary indiscretion and her weight has stabilized. She has been found to have sleep apnea. No Known Allergies   Current Outpatient Medications  Medication Sig Dispense Refill   acetaminophen (TYLENOL) 500 MG tablet Take 500-1,000 mg by mouth every 6 (six) hours as needed for moderate pain or headache.     aspirin 81 MG EC tablet Take 1 tablet (81 mg total) by mouth daily. (Patient taking differently: Take 81 mg by mouth at bedtime.) 150 tablet 2   Calcium Carb-Cholecalciferol (CALCIUM 600 + D PO) Take 1 tablet by mouth every evening.     Cholecalciferol (DIALYVITE VITAMIN D 5000) 125 MCG (5000 UT) capsule Take 5,000 Units by mouth every evening.     diltiazem (CARDIZEM CD) 180 MG 24 hr capsule TAKE 1 CAPSULE BY MOUTH  DAILY 90 capsule 2   diltiazem (CARDIZEM) 60 MG tablet Take 30-60 mg by mouth daily as needed (afib).     ELIQUIS 5 MG TABS tablet TAKE ONE TABLET (5MG  TOTAL) BY MOUTH TWOTIMES DAILY 180 tablet 3   escitalopram (LEXAPRO) 20 MG tablet Take 20 mg by mouth daily.     flecainide (TAMBOCOR) 100 MG tablet TAKE 1 TABLET BY MOUTH  TWICE DAILY 240 tablet 2   levothyroxine (SYNTHROID) 75 MCG tablet Take 75 mcg by mouth every morning.     loratadine (CLARITIN) 10 MG tablet Take 10 mg by mouth daily as needed for allergies.     losartan (COZAAR) 25 MG tablet Take 25 mg by mouth every evening.     magnesium oxide (MAG-OX) 400 MG tablet Take 400 mg by mouth at bedtime.     metoprolol tartrate (LOPRESSOR) 25 MG tablet Take 1 tablet by mouth every 8 hours as needed for breakthrough afib (Patient taking differently: Take 12.5 mg by mouth every 8 (eight) hours  as needed (breakthrough afib).) 60 tablet 3   Multiple Vitamins-Minerals (MULTIVITAMIN WITH MINERALS) tablet Take 1 tablet by mouth in the morning. Women's One A Day     Multiple Vitamins-Minerals (PRESERVISION AREDS 2) CAPS Take 1 capsule by mouth 2 (two) times daily.     Omega-3 Fatty Acids (FISH OIL ULTRA) 1400 MG CAPS Take 1,400 mg by mouth in the morning.     pantoprazole (PROTONIX) 40 MG tablet TAKE ONE TABLET (40MG  TOTAL) BY MOUTH DAILY 90 tablet 3   rosuvastatin (CRESTOR) 20 MG tablet Take 20 mg by mouth in the morning.     No current facility-administered medications for this visit.     Past Medical History:  Diagnosis Date   Atrial fibrillation [facility])    Current use of estrogen therapy 01/31/2013   Dysrhythmia    AFib   Hypertension    Macular degeneration    Menopausal syndrome    Right ovarian cyst 01/25/2014    ROS:   All systems reviewed and negative except as noted in the HPI.   Past Surgical History:  Procedure Laterality Date   A-FLUTTER ABLATION N/A 03/18/2021   Procedure: A-FLUTTER ABLATION;  Surgeon: [name], MD;  Location[facility] INVASIVE CV LAB;  Service: Cardiovascular;  Laterality: N/A;   ABDOMINAL HYSTERECTOMY  APPENDECTOMY     CARDIOVERSION N/A 09/14/2016   Procedure: CARDIOVERSION;  Surgeon: [name], MD;  Location: [facility] ORS;  Service: Cardiovascular;  Laterality: N/A;   CARDIOVERSION N/A 02/09/2021   Procedure: CARDIOVERSION;  Surgeon: [name], MD;  Location: [facility] ENDOSCOPY;  Service: Cardiovascular;  Laterality: N/A;   CATARACT EXTRACTION W/ INTRAOCULAR LENS  IMPLANT, BILATERAL Bilateral    COLONOSCOPY  12/04/2003   Friable anal canal otherwise normal rectum and colon   COLONOSCOPY N/A 03/19/2013   Dr. [name]: diverticulosis, hemorrhoids. next TCS 10 years   COLONOSCOPY N/A 05/02/2019   entire colon normal, non-bleeding external and internal hemorrhoids, grade 3. 5 year screening due to family history.    ESOPHAGOGASTRODUODENOSCOPY N/A 10/17/2018   moderately severe erosive esophagitis, esophagus status post dilation, small hiatal hernia, otherwise normal   IR ANGIO INTRA EXTRACRAN SEL INTERNAL CAROTID BILAT MOD SED  06/16/2021   IR ANGIO VERTEBRAL SEL VERTEBRAL UNI R MOD SED  06/16/2021   IR CT HEAD LTD  01/05/2021   IR CT HEAD LTD  01/05/2021   IR INTRA CRAN STENT  01/05/2021   IR PERCUTANEOUS ART THROMBECTOMY/INFUSION INTRACRANIAL INC DIAG ANGIO  01/05/2021   IR US GUIDE VASC ACCESS LEFT  01/05/2021   IR US GUIDE VASC ACCESS RIGHT  06/16/2021   MALONEY DILATION N/A 10/17/2018   Procedure: Elease Hashimoto DILATION;  Surgeon: [name], MD;  Location: [facility] ENDO SUITE;  Service: Endoscopy;  Laterality: N/A;   RADIOLOGY WITH ANESTHESIA N/A 01/05/2021   Procedure: IR WITH ANESTHESIA;  Surgeon: Radiologist, Medication, MD;  Location: [facility] OR;  Service: Radiology;  Laterality: N/A;   TEE WITHOUT CARDIOVERSION N/A 09/14/2016   Procedure: TRANSESOPHAGEAL ECHOCARDIOGRAM (TEE) WITH PROPOFOL;  Surgeon: [name], MD;  Location: [facility] ORS;  Service: Cardiovascular;  Laterality: N/A;   TONSILECTOMY, ADENOIDECTOMY, BILATERAL MYRINGOTOMY AND TUBES       Family History  Problem Relation Age of Onset   Coronary artery disease Father    Cancer Father        bladder cancer   Arrhythmia Sister        Reportedly had ablation of SVT   Hypertension Sister    Colon cancer Sister 73       s/p surgical resection, no chemo/radiation   Colon cancer Maternal Grandmother    Hypertension Son      Social History   Socioeconomic History   Marital status: Married    Spouse name: Not on file   Number of children: Not on file   Years of education: Not on file   Highest education level: Not on file  Occupational History   Occupation: Nurse    Comment: Copywriter, advertising  Tobacco Use   Smoking status: Never   Smokeless tobacco: Never   Tobacco comments:    Never smoked  Vaping Use   Vaping Use: Never  used  Substance and Sexual Activity   Alcohol use: Not Currently    Alcohol/week: 1.0 standard drink of alcohol    Types: 1 Glasses of wine per week    Comment: occ wine   Drug use: No   Sexual activity: Yes    Birth control/protection: Surgical    Comment: hyst  Other Topics Concern   Not on file  Social History Narrative   Not on file   Social Determinants of Health   Financial Resource Strain: Low Risk  (07/16/2020)   Overall Financial Resource Strain (CARDIA)    Difficulty of Paying Living Expenses:  Not hard at all  Food Insecurity: No Food Insecurity (07/16/2020)   Hunger Vital Sign    Worried About Running Out of Food in the Last Year: Never true    Ran Out of Food in the Last Year: Never true  Transportation Needs: No Transportation Needs (07/16/2020)   PRAPARE - Administrator, Civil Service (Medical): No    Lack of Transportation (Non-Medical): No  Physical Activity: Insufficiently Active (07/16/2020)   Exercise Vital Sign    Days of Exercise per Week: 1 day    Minutes of Exercise per Session: 30 min  Stress: Stress Concern Present (07/16/2020)   [employer] of Occupational Health - Occupational Stress Questionnaire    Feeling of Stress : To some extent  Social Connections: Socially Integrated (07/16/2020)   Social Connection and Isolation Panel [NHANES]    Frequency of Communication with Friends and Family: Three times a week    Frequency of Social Gatherings with Friends and Family: Twice a week    Attends Religious Services: More than 4 times per year    Active Member of [employer] or Organizations: Yes    Attends Engineer, structural: More than 4 times per year    Marital Status: Married  Catering manager Violence: Not At Risk (07/16/2020)   Humiliation, Afraid, Rape, and Kick questionnaire    Fear of Current or Ex-Partner: No    Emotionally Abused: No    Physically Abused: No    Sexually Abused: No     BP 122/70   Pulse 63   Ht 5' 6.5" (1.689  m)   Wt 189 lb 3.2 oz (85.8 kg)   SpO2 94%   BMI 30.08 kg/m   Physical Exam:  Well appearing NAD HEENT: Unremarkable Neck:  No JVD, no thyromegally Lymphatics:  No adenopathy Back:  No CVA tenderness Lungs:  Clear HEART:  Regular rate rhythm, no murmurs, no rubs, no clicks Abd:  soft, positive bowel sounds, no organomegally, no rebound, no guarding Ext:  2 plus pulses, no edema, no cyanosis, no clubbing Skin:  No rashes no nodules Neuro:  CN II through XII intact, motor grossly intact  EKG  DEVICE  Normal device function.  See PaceArt for details.   Assess/Plan:   Atrial flutter - she is s/p very difficult ablation and appears to  be doing well. Atrial fib - she is maintaining NSR on flecainide. Continue. She has had some break throughs but mild. Obesity - I encouraged her to lose weight.  Sleep apnea - she is in the process of starting CPAP. Hopefully this will improve.   Sharlot Gowda Natahlia Hoggard,MD

## 2021-10-20 ENCOUNTER — Ambulatory Visit: Payer: Medicare Other | Attending: Physician Assistant | Admitting: Cardiology

## 2021-10-20 DIAGNOSIS — G4733 Obstructive sleep apnea (adult) (pediatric): Secondary | ICD-10-CM

## 2021-10-22 NOTE — Procedures (Signed)
   Patient Name: Renee Benitez, Renee Benitez Date: 10/20/2021 Gender: Female D.O.B: 13-Oct-1950 Age (years): 34 Referring Provider: Alphonzo Severance PA Height (inches): 66 Interpreting Physician: Armanda Magic MD, ABSM Weight (lbs): 191 RPSGT: Alfonso Ellis BMI: 31 MRN: [id number] Neck Size: 15.00  CLINICAL INFORMATION The patient is referred for a CPAP titration to treat sleep apnea.  SLEEP STUDY TECHNIQUE As per the AASM Manual for the Scoring of Sleep and Associated Events v2.3 (April 2016) with a hypopnea requiring 4% desaturations.  The channels recorded and monitored were frontal, central and occipital EEG, electrooculogram (EOG), submentalis EMG (chin), nasal and oral airflow, thoracic and abdominal wall motion, anterior tibialis EMG, snore microphone, electrocardiogram, and pulse oximetry. Continuous positive airway pressure (CPAP) was initiated at the beginning of the study and titrated to treat sleep-disordered breathing.  MEDICATIONS Medications self-administered by patient taken the night of the study : N/A  TECHNICIAN COMMENTS Comments added by technician: CPAP therapy started at 4 CWP with an EPR of 3. CPAP titration increased to 8 CWP with an EPR level of 1. Significant Bradycardia noticed through out study. Patient tolerated CPAP very well. Patient had more than two awakenings to use the bathroom. Patient had difficulty initiating sleep. Comments added by scorer: N/A  RESPIRATORY PARAMETERS Optimal PAP Pressure (cm): 8  AHI at Optimal Pressure (/hr):1.0 Overall Minimal O2 (%):86.00  Supine % at Optimal Pressure (%):N/A Minimal O2 at Optimal Pressure (%): 81.00   SLEEP ARCHITECTURE The study was initiated at 11:19:13 PM and ended at 5:47:38 AM.  Sleep onset time was 67.3 minutes and the sleep efficiency was 66.2%. The total sleep time was 257 minutes.  The patient spent 6.03% of the night in stage N1 sleep, 38.52% in stage N2 sleep, 30.54% in stage N3 and 24.9% in  REM.Stage REM latency was 74.0 minutes  Wake after sleep onset was 64.1. Alpha intrusion was absent. Supine sleep was 100.00%.  CARDIAC DATA The 2 lead EKG demonstrated NSR. The mean heart rate was 44.65 beats per minute. Other EKG findings include: None.  LEG MOVEMENT DATA The total Periodic Limb Movements of Sleep (PLMS) were 0. The PLMS index was 0.00. A PLMS index of <15 is considered normal in adults.  IMPRESSIONS - An optimal PAP pressure could not be selected for this patient based on the available study data. - Central sleep apnea was not noted during this titration (CAI = 0.2/h). - Moderate oxygen desaturations were observed during this titration (min O2 = 86.00%). - The patient snored with soft snoring volume during this titration study. - No cardiac abnormalities were observed during this study. - Clinically significant periodic limb movements were not noted during this study. Arousals associated with PLMs were rare.  DIAGNOSIS - Obstructive Sleep Apnea (G47.33)  RECOMMENDATIONS - Recommend ResMed CPAP at 8cm H2O with heated humidity and mask of choice. - Avoid alcohol, sedatives and other CNS depressants that may worsen sleep apnea and disrupt normal sleep architecture. - Sleep hygiene should be reviewed to assess factors that may improve sleep quality. - Weight management and regular exercise should be initiated or continued. - Return to Sleep Center for re-evaluation after 6 weeks of therapy  [Electronically signed] 10/22/2021 12:42 PM  Armanda Magic MD, ABSM Diplomate, American Board of Sleep Medicine

## 2021-11-04 ENCOUNTER — Telehealth: Payer: Self-pay | Admitting: *Deleted

## 2021-11-04 NOTE — Telephone Encounter (Signed)
CC'd Chart Routing History  Routing History  From: Quintella Reichert, MD On: 10/22/2021 12:44 PM  To: Cv Div Sleep Studies (Pool)  Priority: Routine  Routing Comments:  Please let patient know that they had a successful PAP titration and let DME know that orders are in EPIC.  Please set up 6 week OV with me. Please get overnight pulse ox on CPAP

## 2021-11-04 NOTE — Telephone Encounter (Signed)
The patient has been notified of the result and verbalized understanding.  All questions (if any) were answered. Latrelle Dodrill, CMA 11/04/2021 1:04 PM    Upon patient request DME selection is [facility]. Patient understands he will be contacted by [facility] to set up his cpap. Patient understands to call if [facility] does not contact him with new setup in a timely manner. Patient understands they will be called once confirmation has been received from [facility] that they have received their new machine to schedule 10 week follow up appointment.   [facility] notified of new cpap order  Please add to airview Patient was grateful for the call and thanked me.

## 2021-11-13 ENCOUNTER — Other Ambulatory Visit (HOSPITAL_COMMUNITY): Payer: Self-pay | Admitting: Adult Health

## 2021-11-13 DIAGNOSIS — Z1231 Encounter for screening mammogram for malignant neoplasm of breast: Secondary | ICD-10-CM

## 2021-11-23 ENCOUNTER — Ambulatory Visit (HOSPITAL_COMMUNITY)
Admission: RE | Admit: 2021-11-23 | Discharge: 2021-11-23 | Disposition: A | Discharge status: Home / Self Care | Payer: Medicare Other | Source: Ambulatory Visit | Attending: Adult Health | Admitting: Adult Health

## 2021-11-23 DIAGNOSIS — Z1231 Encounter for screening mammogram for malignant neoplasm of breast: Secondary | ICD-10-CM | POA: Insufficient documentation

## 2022-01-20 ENCOUNTER — Ambulatory Visit: Payer: Medicare Other | Attending: Cardiology | Admitting: Cardiology

## 2022-01-20 ENCOUNTER — Encounter: Payer: Self-pay | Admitting: Cardiology

## 2022-01-20 VITALS — BP 104/62 | HR 46 | Ht 66.0 in | Wt 194.6 lb

## 2022-01-20 DIAGNOSIS — G4733 Obstructive sleep apnea (adult) (pediatric): Secondary | ICD-10-CM | POA: Diagnosis not present

## 2022-01-20 DIAGNOSIS — I10 Essential (primary) hypertension: Secondary | ICD-10-CM | POA: Diagnosis not present

## 2022-01-20 NOTE — Progress Notes (Signed)
Sleep Medicine CONSULT Note    Date:  01/20/2022   ID:  Renee Benitez, DOB 11-21-1950, MRN [id number]  PCP:  Aliene Beams, MD  Cardiologist:  Dorathy Daft  Chief Complaint  Patient presents with   New Patient (Initial Visit)    Obstructive sleep apnea    History of Present Illness:  Renee Benitez is a 71 y.o. female who is being seen today for the evaluation of obstructive sleep apnea at the request of Lewayne Bunting, MD and Jorja Loa, [place].  This is a 71 year old female with a history of paroxysmal atrial fibrillation, hypertension.  Due to hx of PAF and excessive daytime sleepiness she underwent home sleep study on 08/17/2021 which demonstrated mild obstructive sleep apnea with an AHI of 8.1/h and nocturnal hypoxemia with O2 saturations as low as 75%.  She subsequently underwent CPAP titration to 8 cm H2O.  She tells me that she did not think she snored in the past,.  She denies awakening gasping for breath or snoring.  She denied any morning HAs. She had a CVA a year ago.  She had an afib RFA in 12/22 and then had a reoccurrence of PAF for a few weeks after and then went away.  She says that she has not had an reoccurrence of PAF in the past 3 months.    She is doing well with her PAP device and thinks that she has gotten used to it.  She tolerates the nasal pillow mask and feels the pressure is adequate.  Since going on PAP she feels rested in the am and has no significant daytime sleepiness.  She denies any mouth dryness or nasal congestion.  She does have some mild nasal dryness at times. She does not think that she snores.    Past Medical History:  Diagnosis Date   Atrial fibrillation [facility])    Current use of estrogen therapy 01/31/2013   Dysrhythmia    AFib   Hypertension    Macular degeneration    Menopausal syndrome    Right ovarian cyst 01/25/2014    Past Surgical History:  Procedure Laterality Date   A-FLUTTER ABLATION N/A 03/18/2021   Procedure: A-FLUTTER  ABLATION;  Surgeon: [name], MD;  Location: [facility] INVASIVE CV LAB;  Service: Cardiovascular;  Laterality: N/A;   ABDOMINAL HYSTERECTOMY     APPENDECTOMY     CARDIOVERSION N/A 09/14/2016   Procedure: CARDIOVERSION;  Surgeon: [name], MD;  Location: [facility] ORS;  Service: Cardiovascular;  Laterality: N/A;   CARDIOVERSION N/A 02/09/2021   Procedure: CARDIOVERSION;  Surgeon: [name], MD;  Location: [facility] ENDOSCOPY;  Service: Cardiovascular;  Laterality: N/A;   CATARACT EXTRACTION W/ INTRAOCULAR LENS  IMPLANT, BILATERAL Bilateral    COLONOSCOPY  12/04/2003   Friable anal canal otherwise normal rectum and colon   COLONOSCOPY N/A 03/19/2013   Dr. [name]: diverticulosis, hemorrhoids. next TCS 10 years   COLONOSCOPY N/A 05/02/2019   entire colon normal, non-bleeding external and internal hemorrhoids, grade 3. 5 year screening due to family history.   ESOPHAGOGASTRODUODENOSCOPY N/A 10/17/2018   moderately severe erosive esophagitis, esophagus status post dilation, small hiatal hernia, otherwise normal   IR ANGIO INTRA EXTRACRAN SEL INTERNAL CAROTID BILAT MOD SED  06/16/2021   IR ANGIO VERTEBRAL SEL VERTEBRAL UNI R MOD SED  06/16/2021   IR CT HEAD LTD  01/05/2021   IR CT HEAD LTD  01/05/2021   IR INTRA CRAN STENT  01/05/2021   IR PERCUTANEOUS  ART THROMBECTOMY/INFUSION INTRACRANIAL INC DIAG ANGIO  01/05/2021   IR US GUIDE VASC ACCESS LEFT  01/05/2021   IR US GUIDE VASC ACCESS RIGHT  06/16/2021   MALONEY DILATION N/A 10/17/2018   Procedure: Venia Minks DILATION;  Surgeon: Daneil Dolin, MD;  Location: [facility] ENDO SUITE;  Service: Endoscopy;  Laterality: N/A;   RADIOLOGY WITH ANESTHESIA N/A 01/05/2021   Procedure: IR WITH ANESTHESIA;  Surgeon: Radiologist, Medication, MD;  Location: [facility];  Service: Radiology;  Laterality: N/A;   TEE WITHOUT CARDIOVERSION N/A 09/14/2016   Procedure: TRANSESOPHAGEAL ECHOCARDIOGRAM (TEE) WITH PROPOFOL;  Surgeon: Satira Sark, MD;  Location: [facility] ORS;   Service: Cardiovascular;  Laterality: N/A;   TONSILECTOMY, ADENOIDECTOMY, BILATERAL MYRINGOTOMY AND TUBES      Current Medications: Current Meds  Medication Sig   acetaminophen (TYLENOL) 500 MG tablet Take 500-1,000 mg by mouth every 6 (six) hours as needed for moderate pain or headache.   aspirin 81 MG EC tablet Take 1 tablet (81 mg total) by mouth daily.   Calcium Carb-Cholecalciferol (CALCIUM 600 + D PO) Take 1 tablet by mouth every evening.   Cholecalciferol (DIALYVITE VITAMIN D 5000) 125 MCG (5000 UT) capsule Take 5,000 Units by mouth every evening.   diltiazem (CARDIZEM CD) 180 MG 24 hr capsule Take 1 capsule (180 mg total) by mouth daily.   diltiazem (CARDIZEM) 60 MG tablet Take 0.5-1 tablets (30-60 mg total) by mouth daily as needed (afib).   ELIQUIS 5 MG TABS tablet TAKE ONE TABLET (5MG  TOTAL) BY MOUTH TWOTIMES DAILY   escitalopram (LEXAPRO) 20 MG tablet Take 20 mg by mouth daily.   flecainide (TAMBOCOR) 100 MG tablet Take 1 tablet (100 mg total) by mouth 2 (two) times daily.   levothyroxine (SYNTHROID) 100 MCG tablet Take 100 mcg by mouth every morning.   loratadine (CLARITIN) 10 MG tablet Take 10 mg by mouth daily as needed for allergies.   losartan (COZAAR) 25 MG tablet Take 25 mg by mouth every evening.   Magnesium 250 MG TABS Take 250 mg by mouth in the morning and at bedtime.   magnesium oxide (MAG-OX) 400 MG tablet Take 400 mg by mouth at bedtime.   Multiple Vitamins-Minerals (MULTIVITAMIN WITH MINERALS) tablet Take 1 tablet by mouth in the morning. Women's One A Day   Multiple Vitamins-Minerals (PRESERVISION AREDS 2) CAPS Take 1 capsule by mouth 2 (two) times daily.   Omega-3 Fatty Acids (FISH OIL ULTRA) 1400 MG CAPS Take 1,400 mg by mouth in the morning.   pantoprazole (PROTONIX) 40 MG tablet TAKE ONE TABLET (40MG  TOTAL) BY MOUTH DAILY   rosuvastatin (CRESTOR) 20 MG tablet Take 20 mg by mouth in the morning.    Allergies:   Patient has no known allergies.   Social  History   Socioeconomic History   Marital status: Married    Spouse name: Not on file   Number of children: Not on file   Years of education: Not on file   Highest education level: Not on file  Occupational History   Occupation: Nurse    Comment: [employer]  Tobacco Use   Smoking status: Never   Smokeless tobacco: Never   Tobacco comments:    Never smoked  Vaping Use   Vaping Use: Never used  Substance and Sexual Activity   Alcohol use: Not Currently    Alcohol/week: 1.0 standard drink of alcohol    Types: 1 Glasses of wine per week    Comment: occ wine   Drug  use: No   Sexual activity: Yes    Birth control/protection: Surgical    Comment: hyst  Other Topics Concern   Not on file  Social History Narrative   Not on file   Social Determinants of Health   Financial Resource Strain: Low Risk  (07/16/2020)   Overall Financial Resource Strain (CARDIA)    Difficulty of Paying Living Expenses: Not hard at all  Food Insecurity: No Food Insecurity (07/16/2020)   Hunger Vital Sign    Worried About Running Out of Food in the Last Year: Never true    [place] in the Last Year: Never true  Transportation Needs: No Transportation Needs (07/16/2020)   PRAPARE - Hydrologist (Medical): No    Lack of Transportation (Non-Medical): No  Physical Activity: Insufficiently Active (07/16/2020)   Exercise Vital Sign    Days of Exercise per Week: 1 day    Minutes of Exercise per Session: 30 min  Stress: Stress Concern Present (07/16/2020)   [place]    Feeling of Stress : To some extent  Social Connections: Socially Integrated (07/16/2020)   Social Connection and Isolation Panel [NHANES]    Frequency of Communication with Friends and Family: Three times a week    Frequency of Social Gatherings with Friends and Family: Twice a week    Attends Religious Services: More than 4 times per year     Active Member of [employer] or Organizations: Yes    Attends Music therapist: More than 4 times per year    Marital Status: Married     Family History:  The patient's family history includes Arrhythmia in her sister; Cancer in her father; Colon cancer in her maternal grandmother; Colon cancer (age of onset: 52) in her sister; Coronary artery disease in her father; Hypertension in her sister and son.   ROS:   Please see the history of present illness.    ROS All other systems reviewed and are negative.      No data to display             PHYSICAL EXAM:   VS:  BP 104/62   Pulse (!) 46   Ht 5\' 6"  (1.676 m)   Wt 194 lb 9.6 oz (88.3 kg)   SpO2 98%   BMI 31.41 kg/m    GEN: Well nourished, well developed, in no acute distress  HEENT: normal  Neck: no JVD, carotid bruits, or masses Cardiac: RRR; no murmurs, rubs, or gallops,no edema.  Intact distal pulses bilaterally.  Respiratory:  clear to auscultation bilaterally, normal work of breathing GI: soft, nontender, nondistended, + BS MS: no deformity or atrophy  Skin: warm and dry, no rash Neuro:  Alert and Oriented x 3, Strength and sensation are intact Psych: euthymic mood, full affect  Wt Readings from Last 3 Encounters:  01/20/22 194 lb 9.6 oz (88.3 kg)  10/07/21 189 lb 3.2 oz (85.8 kg)  09/23/21 190 lb 9.6 oz (86.5 kg)      Studies/Labs Reviewed:   Home sleep study and, CPAP titration, PAP download  Recent Labs: 06/16/2021: BUN 20; Creatinine, Ser 1.00; Hemoglobin 14.3; Platelets 225; Potassium 5.5; Sodium 137    CHA2DS2-VASc Score = 5   This indicates a 7.2% annual risk of stroke. The patient's score is based upon: CHF History: 0 HTN History: 1 Diabetes History: 0 Stroke History: 2 Vascular Disease History: 0 Age Score: 1 Gender  Score: 1           Additional studies/ records that were reviewed today include:  None    ASSESSMENT:    1. OSA (obstructive sleep apnea)   2. Essential  hypertension, benign      PLAN:  In order of problems listed above:  OSA - The patient is tolerating PAP therapy well without any problems. The PAP download performed by his DME was personally reviewed and interpreted by me today and showed an AHI of 3.8 /hr on 8 cm H2O with 97% compliance in using more than 4 hours nightly.  The patient has been using and benefiting from PAP use and will continue to benefit from therapy.  -check ONO on CPAP to make sure she has no residual hypoxemia  2.  Hypertension -BP is controlled on exam today -Continue prescription drug management with Cardizem CD 180 mg daily, Losartan 25 mg daily with as needed refills   Time Spent: 20 minutes total time of encounter, including 15 minutes spent in face-to-face patient care on the date of this encounter. This time includes coordination of care and counseling regarding above mentioned problem list. Remainder of non-face-to-face time involved reviewing chart documents/testing relevant to the patient encounter and documentation in the medical record. I have independently reviewed documentation from referring provider  Medication Adjustments/Labs and Tests Ordered: Current medicines are reviewed at length with the patient today.  Concerns regarding medicines are outlined above.  Medication changes, Labs and Tests ordered today are listed in the Patient Instructions below.  There are no Patient Instructions on file for this visit.   Signed, Fransico Him, MD  01/20/2022 10:47 AM    [address], [place], [place]  [address] Phone: [phone]; Fax: [phone]

## 2022-01-20 NOTE — Addendum Note (Signed)
Addended by: Antonieta Iba on: 01/20/2022 11:06 AM   Modules accepted: Orders

## 2022-01-20 NOTE — Patient Instructions (Signed)
Medication Instructions:  Your physician recommends that you continue on your current medications as directed. Please refer to the Current Medication list given to you today.  *If you need a refill on your cardiac medications before your next appointment, please call your pharmacy*  Testing/Procedures: Overnight pulse oximeter on CPAP  Follow-Up: At [facility], you and your health needs are our priority.  As part of our continuing mission to provide you with exceptional heart care, we have created designated Provider Care Teams.  These Care Teams include your primary Cardiologist (physician) and Advanced Practice Providers (APPs -  Physician Assistants and Nurse Practitioners) who all work together to provide you with the care you need, when you need it.  Your next appointment:   1 year(s)  The format for your next appointment:   In Person  Provider:   Fransico Him, MD  Important Information About Sugar

## 2022-04-29 ENCOUNTER — Other Ambulatory Visit (HOSPITAL_COMMUNITY): Payer: Self-pay | Admitting: Family Medicine

## 2022-04-29 DIAGNOSIS — E2839 Other primary ovarian failure: Secondary | ICD-10-CM

## 2022-04-29 DIAGNOSIS — Z1231 Encounter for screening mammogram for malignant neoplasm of breast: Secondary | ICD-10-CM

## 2022-04-29 DIAGNOSIS — Z780 Asymptomatic menopausal state: Secondary | ICD-10-CM

## 2022-05-07 ENCOUNTER — Ambulatory Visit (HOSPITAL_COMMUNITY)
Admission: RE | Admit: 2022-05-07 | Discharge: 2022-05-07 | Disposition: A | Discharge status: Home / Self Care | Payer: Medicare Other | Source: Ambulatory Visit | Attending: Family Medicine | Admitting: Family Medicine

## 2022-05-07 DIAGNOSIS — Z780 Asymptomatic menopausal state: Secondary | ICD-10-CM | POA: Diagnosis present

## 2022-05-07 DIAGNOSIS — E2839 Other primary ovarian failure: Secondary | ICD-10-CM

## 2022-05-13 ENCOUNTER — Encounter: Payer: Self-pay | Admitting: Internal Medicine

## 2022-05-18 ENCOUNTER — Telehealth: Payer: Self-pay

## 2022-05-18 ENCOUNTER — Other Ambulatory Visit: Payer: Self-pay

## 2022-05-18 ENCOUNTER — Inpatient Hospital Stay (HOSPITAL_COMMUNITY)
Admission: EM | Admit: 2022-05-18 | Discharge: 2022-05-21 | DRG: 281 | Disposition: A | Discharge status: Home / Self Care | Payer: Medicare Other | Attending: Internal Medicine | Admitting: Internal Medicine

## 2022-05-18 ENCOUNTER — Encounter (HOSPITAL_COMMUNITY): Payer: Self-pay

## 2022-05-18 DIAGNOSIS — D72829 Elevated white blood cell count, unspecified: Secondary | ICD-10-CM | POA: Diagnosis present

## 2022-05-18 DIAGNOSIS — Z79890 Hormone replacement therapy (postmenopausal): Secondary | ICD-10-CM

## 2022-05-18 DIAGNOSIS — N3281 Overactive bladder: Secondary | ICD-10-CM | POA: Diagnosis present

## 2022-05-18 DIAGNOSIS — E871 Hypo-osmolality and hyponatremia: Secondary | ICD-10-CM | POA: Diagnosis present

## 2022-05-18 DIAGNOSIS — I480 Paroxysmal atrial fibrillation: Secondary | ICD-10-CM | POA: Diagnosis present

## 2022-05-18 DIAGNOSIS — G4733 Obstructive sleep apnea (adult) (pediatric): Secondary | ICD-10-CM | POA: Diagnosis present

## 2022-05-18 DIAGNOSIS — I214 Non-ST elevation (NSTEMI) myocardial infarction: Principal | ICD-10-CM | POA: Diagnosis present

## 2022-05-18 DIAGNOSIS — I447 Left bundle-branch block, unspecified: Secondary | ICD-10-CM | POA: Diagnosis present

## 2022-05-18 DIAGNOSIS — R001 Bradycardia, unspecified: Secondary | ICD-10-CM | POA: Diagnosis present

## 2022-05-18 DIAGNOSIS — Z8673 Personal history of transient ischemic attack (TIA), and cerebral infarction without residual deficits: Secondary | ICD-10-CM

## 2022-05-18 DIAGNOSIS — Z8249 Family history of ischemic heart disease and other diseases of the circulatory system: Secondary | ICD-10-CM

## 2022-05-18 DIAGNOSIS — Z800 Family history of malignant neoplasm of digestive organs: Secondary | ICD-10-CM

## 2022-05-18 DIAGNOSIS — Z7901 Long term (current) use of anticoagulants: Secondary | ICD-10-CM

## 2022-05-18 DIAGNOSIS — Z7982 Long term (current) use of aspirin: Secondary | ICD-10-CM

## 2022-05-18 DIAGNOSIS — I5181 Takotsubo syndrome: Secondary | ICD-10-CM | POA: Diagnosis present

## 2022-05-18 DIAGNOSIS — I440 Atrioventricular block, first degree: Secondary | ICD-10-CM | POA: Diagnosis present

## 2022-05-18 DIAGNOSIS — K219 Gastro-esophageal reflux disease without esophagitis: Secondary | ICD-10-CM | POA: Diagnosis present

## 2022-05-18 DIAGNOSIS — E785 Hyperlipidemia, unspecified: Secondary | ICD-10-CM | POA: Diagnosis present

## 2022-05-18 DIAGNOSIS — I10 Essential (primary) hypertension: Secondary | ICD-10-CM | POA: Diagnosis present

## 2022-05-18 DIAGNOSIS — Z79899 Other long term (current) drug therapy: Secondary | ICD-10-CM

## 2022-05-18 DIAGNOSIS — E1165 Type 2 diabetes mellitus with hyperglycemia: Secondary | ICD-10-CM | POA: Diagnosis present

## 2022-05-18 DIAGNOSIS — I4892 Unspecified atrial flutter: Secondary | ICD-10-CM | POA: Diagnosis present

## 2022-05-18 DIAGNOSIS — N3289 Other specified disorders of bladder: Secondary | ICD-10-CM | POA: Diagnosis present

## 2022-05-18 DIAGNOSIS — I25118 Atherosclerotic heart disease of native coronary artery with other forms of angina pectoris: Secondary | ICD-10-CM | POA: Diagnosis present

## 2022-05-18 HISTORY — DX: Overactive bladder: N32.81

## 2022-05-18 LAB — COMPREHENSIVE METABOLIC PANEL
ALT: 25 U/L (ref 0–44)
AST: 31 U/L (ref 15–41)
Albumin: 4.3 g/dL (ref 3.5–5.0)
Alkaline Phosphatase: 87 U/L (ref 38–126)
Anion gap: 15 (ref 5–15)
BUN: 19 mg/dL (ref 8–23)
CO2: 19 mmol/L — ABNORMAL LOW (ref 22–32)
Calcium: 9.3 mg/dL (ref 8.9–10.3)
Chloride: 100 mmol/L (ref 98–111)
Creatinine, Ser: 0.95 mg/dL (ref 0.44–1.00)
GFR, Estimated: >60 mL/min (ref >60)
Glucose, Bld: 165 mg/dL — ABNORMAL HIGH (ref 70–99)
Potassium: 3.8 mmol/L (ref 3.5–5.1)
Sodium: 134 mmol/L — ABNORMAL LOW (ref 135–145)
Total Bilirubin: 0.9 mg/dL (ref 0.3–1.2)
Total Protein: 7.5 g/dL (ref 6.5–8.1)

## 2022-05-18 LAB — CBC
HCT: 42.1 % (ref 36.0–46.0)
Hemoglobin: 14.3 g/dL (ref 12.0–15.0)
MCH: 29.9 pg (ref 26.0–34.0)
MCHC: 34 g/dL (ref 30.0–36.0)
MCV: 88.1 fL (ref 80.0–100.0)
Platelets: 250 10*3/uL (ref 150–400)
RBC: 4.78 MIL/uL (ref 3.87–5.11)
RDW: 12.6 % (ref 11.5–15.5)
WBC: 13.5 10*3/uL — ABNORMAL HIGH (ref 4.0–10.5)
nRBC: 0 % (ref 0.0–0.2)

## 2022-05-18 LAB — LIPASE, BLOOD: Lipase: 41 U/L (ref 11–51)

## 2022-05-18 NOTE — Telephone Encounter (Signed)
Pt called per MyChart message communication between Willey, and Drue Dun RN / Pt.    Pt is taking Flecainide, and Myrbetriq, Per Pharmacist Megan S.  A two week follow up EKG is recommended to check pt Qtc.    Pt understood and requests a Wednesday morning appointment 05/26/2022 in [place].    I will send a message to the [place] office to schedule this, per Pt request.

## 2022-05-18 NOTE — ED Triage Notes (Signed)
Pt reports onset of of acid reflux after eating shrimp and grits this evening.

## 2022-05-19 ENCOUNTER — Emergency Department (HOSPITAL_COMMUNITY): Payer: Medicare Other

## 2022-05-19 ENCOUNTER — Other Ambulatory Visit (HOSPITAL_COMMUNITY): Payer: Self-pay | Admitting: *Deleted

## 2022-05-19 ENCOUNTER — Inpatient Hospital Stay (HOSPITAL_COMMUNITY): Payer: Medicare Other

## 2022-05-19 ENCOUNTER — Encounter (HOSPITAL_COMMUNITY): Payer: Self-pay | Admitting: Internal Medicine

## 2022-05-19 DIAGNOSIS — I214 Non-ST elevation (NSTEMI) myocardial infarction: Secondary | ICD-10-CM

## 2022-05-19 DIAGNOSIS — G4733 Obstructive sleep apnea (adult) (pediatric): Secondary | ICD-10-CM | POA: Diagnosis not present

## 2022-05-19 DIAGNOSIS — I480 Paroxysmal atrial fibrillation: Secondary | ICD-10-CM | POA: Diagnosis not present

## 2022-05-19 DIAGNOSIS — Z8249 Family history of ischemic heart disease and other diseases of the circulatory system: Secondary | ICD-10-CM | POA: Diagnosis not present

## 2022-05-19 DIAGNOSIS — I440 Atrioventricular block, first degree: Secondary | ICD-10-CM | POA: Diagnosis present

## 2022-05-19 DIAGNOSIS — I447 Left bundle-branch block, unspecified: Secondary | ICD-10-CM | POA: Diagnosis present

## 2022-05-19 DIAGNOSIS — K219 Gastro-esophageal reflux disease without esophagitis: Secondary | ICD-10-CM | POA: Diagnosis not present

## 2022-05-19 DIAGNOSIS — E1165 Type 2 diabetes mellitus with hyperglycemia: Secondary | ICD-10-CM | POA: Diagnosis not present

## 2022-05-19 DIAGNOSIS — Z79899 Other long term (current) drug therapy: Secondary | ICD-10-CM | POA: Diagnosis not present

## 2022-05-19 DIAGNOSIS — Z7982 Long term (current) use of aspirin: Secondary | ICD-10-CM | POA: Diagnosis not present

## 2022-05-19 DIAGNOSIS — R001 Bradycardia, unspecified: Secondary | ICD-10-CM | POA: Diagnosis present

## 2022-05-19 DIAGNOSIS — Z7901 Long term (current) use of anticoagulants: Secondary | ICD-10-CM | POA: Diagnosis not present

## 2022-05-19 DIAGNOSIS — I25118 Atherosclerotic heart disease of native coronary artery with other forms of angina pectoris: Secondary | ICD-10-CM | POA: Diagnosis present

## 2022-05-19 DIAGNOSIS — I5181 Takotsubo syndrome: Secondary | ICD-10-CM | POA: Diagnosis not present

## 2022-05-19 DIAGNOSIS — N3289 Other specified disorders of bladder: Secondary | ICD-10-CM | POA: Diagnosis present

## 2022-05-19 DIAGNOSIS — N3281 Overactive bladder: Secondary | ICD-10-CM | POA: Diagnosis present

## 2022-05-19 DIAGNOSIS — I4892 Unspecified atrial flutter: Secondary | ICD-10-CM | POA: Diagnosis not present

## 2022-05-19 DIAGNOSIS — I10 Essential (primary) hypertension: Secondary | ICD-10-CM | POA: Diagnosis not present

## 2022-05-19 DIAGNOSIS — Z79890 Hormone replacement therapy (postmenopausal): Secondary | ICD-10-CM | POA: Diagnosis not present

## 2022-05-19 DIAGNOSIS — Z800 Family history of malignant neoplasm of digestive organs: Secondary | ICD-10-CM | POA: Diagnosis not present

## 2022-05-19 DIAGNOSIS — D72829 Elevated white blood cell count, unspecified: Secondary | ICD-10-CM | POA: Diagnosis not present

## 2022-05-19 DIAGNOSIS — Z8673 Personal history of transient ischemic attack (TIA), and cerebral infarction without residual deficits: Secondary | ICD-10-CM | POA: Diagnosis not present

## 2022-05-19 DIAGNOSIS — E785 Hyperlipidemia, unspecified: Secondary | ICD-10-CM | POA: Diagnosis not present

## 2022-05-19 DIAGNOSIS — E871 Hypo-osmolality and hyponatremia: Secondary | ICD-10-CM | POA: Diagnosis not present

## 2022-05-19 LAB — URINALYSIS, ROUTINE W REFLEX MICROSCOPIC
Bilirubin Urine: NEGATIVE
Glucose, UA: NEGATIVE mg/dL
Hgb urine dipstick: NEGATIVE
Ketones, ur: 20 mg/dL — AB
Leukocytes,Ua: NEGATIVE
Nitrite: NEGATIVE
Protein, ur: 30 mg/dL — AB
Specific Gravity, Urine: 1.026 (ref 1.005–1.030)
pH: 6 (ref 5.0–8.0)

## 2022-05-19 LAB — ECHOCARDIOGRAM COMPLETE
AR max vel: 2.22 cm2
AV Area VTI: 2.28 cm2
AV Area mean vel: 2.32 cm2
AV Mean grad: 3.7 mmHg
AV Peak grad: 7.3 mmHg
Ao pk vel: 1.35 m/s
Area-P 1/2: 3.85 cm2
Calc EF: 49.9 %
Height: 66 in
P 1/2 time: 552 msec
S' Lateral: 3 cm
Single Plane A2C EF: 43.6 %
Single Plane A4C EF: 50.6 %
Weight: 3104 oz

## 2022-05-19 LAB — TSH: TSH: 2.971 u[IU]/mL (ref 0.350–4.500)

## 2022-05-19 LAB — TROPONIN I (HIGH SENSITIVITY)
Troponin I (High Sensitivity): 989 ng/L (ref <18)
Troponin I (High Sensitivity): 1756 ng/L (ref <18)

## 2022-05-19 LAB — HEMOGLOBIN A1C
Hgb A1c MFr Bld: 5.4 % (ref 4.8–5.6)
Mean Plasma Glucose: 108.28 mg/dL

## 2022-05-19 LAB — APTT
aPTT: 59 seconds — ABNORMAL HIGH (ref 24–36)
aPTT: 70 seconds — ABNORMAL HIGH (ref 24–36)

## 2022-05-19 LAB — BRAIN NATRIURETIC PEPTIDE: B Natriuretic Peptide: 998 pg/mL — ABNORMAL HIGH (ref 0.0–100.0)

## 2022-05-19 LAB — HEPARIN LEVEL (UNFRACTIONATED): Heparin Unfractionated: >1.1 IU/mL — ABNORMAL HIGH (ref 0.30–0.70)

## 2022-05-19 LAB — MAGNESIUM: Magnesium: 2.2 mg/dL (ref 1.7–2.4)

## 2022-05-19 MED ORDER — METOPROLOL TARTRATE 25 MG PO TABS: 25.0000 mg | ORAL_TABLET | Freq: Two times a day (BID) | ORAL | Status: DC

## 2022-05-19 MED ORDER — LOSARTAN POTASSIUM 25 MG PO TABS
25.0000 mg | ORAL_TABLET | Freq: Every evening | ORAL | Status: DC
  Administered 2022-05-19 – 2022-05-20 (×2): 25 mg via ORAL
  Filled 2022-05-19 (×2): qty 1

## 2022-05-19 MED ORDER — OMEGA-3-ACID ETHYL ESTERS 1 G PO CAPS
1000.0000 mg | ORAL_CAPSULE | Freq: Every day | ORAL | Status: DC
  Administered 2022-05-19 – 2022-05-21 (×3): 1000 mg via ORAL
  Filled 2022-05-19 (×3): qty 1

## 2022-05-19 MED ORDER — ACETAMINOPHEN 500 MG PO TABS: 500.0000 mg | ORAL_TABLET | Freq: Four times a day (QID) | ORAL | Status: DC | PRN

## 2022-05-19 MED ORDER — ONDANSETRON HCL 4 MG/2ML IJ SOLN
4.0000 mg | Freq: Once | INTRAMUSCULAR | Status: AC
  Administered 2022-05-19: 4 mg via INTRAVENOUS
  Filled 2022-05-19: qty 2

## 2022-05-19 MED ORDER — MORPHINE SULFATE (PF) 4 MG/ML IV SOLN
4.0000 mg | Freq: Once | INTRAVENOUS | Status: AC
  Administered 2022-05-19: 4 mg via INTRAVENOUS
  Filled 2022-05-19: qty 1

## 2022-05-19 MED ORDER — ASPIRIN 81 MG PO CHEW
324.0000 mg | CHEWABLE_TABLET | Freq: Once | ORAL | Status: AC
  Administered 2022-05-19: 324 mg via ORAL
  Filled 2022-05-19: qty 4

## 2022-05-19 MED ORDER — PANTOPRAZOLE SODIUM 40 MG PO TBEC
40.0000 mg | DELAYED_RELEASE_TABLET | Freq: Every day | ORAL | Status: DC
  Administered 2022-05-19 – 2022-05-21 (×3): 40 mg via ORAL
  Filled 2022-05-19 (×3): qty 1

## 2022-05-19 MED ORDER — OYSTER SHELL CALCIUM/D3 500-5 MG-MCG PO TABS
1.0000 | ORAL_TABLET | Freq: Every evening | ORAL | Status: DC
  Administered 2022-05-19 – 2022-05-20 (×2): 1 via ORAL
  Filled 2022-05-19 (×4): qty 1

## 2022-05-19 MED ORDER — SODIUM CHLORIDE 0.9% FLUSH: 3.0000 mL | INTRAVENOUS | Status: DC | PRN

## 2022-05-19 MED ORDER — VITAMIN D 25 MCG (1000 UNIT) PO TABS
5000.0000 [IU] | ORAL_TABLET | Freq: Every evening | ORAL | Status: DC
  Administered 2022-05-19 – 2022-05-20 (×2): 5000 [IU] via ORAL
  Filled 2022-05-19 (×2): qty 5

## 2022-05-19 MED ORDER — LORATADINE 10 MG PO TABS: 10.0000 mg | ORAL_TABLET | Freq: Every day | ORAL | Status: DC | PRN

## 2022-05-19 MED ORDER — SODIUM CHLORIDE 0.9% FLUSH
3.0000 mL | Freq: Two times a day (BID) | INTRAVENOUS | Status: DC
  Administered 2022-05-19 – 2022-05-20 (×2): 3 mL via INTRAVENOUS

## 2022-05-19 MED ORDER — ADULT MULTIVITAMIN W/MINERALS CH
1.0000 | ORAL_TABLET | Freq: Every morning | ORAL | Status: DC
  Administered 2022-05-19 – 2022-05-21 (×3): 1 via ORAL
  Filled 2022-05-19 (×3): qty 1

## 2022-05-19 MED ORDER — HEPARIN BOLUS VIA INFUSION
2000.0000 [IU] | Freq: Once | INTRAVENOUS | Status: AC
  Administered 2022-05-19: 2000 [IU] via INTRAVENOUS

## 2022-05-19 MED ORDER — HEPARIN (PORCINE) 25000 UT/250ML-% IV SOLN
1150.0000 [IU]/h | INTRAVENOUS | Status: DC
  Administered 2022-05-19: 1000 [IU]/h via INTRAVENOUS
  Administered 2022-05-20: 1150 [IU]/h via INTRAVENOUS
  Filled 2022-05-19 (×2): qty 250

## 2022-05-19 MED ORDER — SODIUM CHLORIDE 0.9 % IV SOLN: 250.0000 mL | INTRAVENOUS | Status: DC | PRN

## 2022-05-19 MED ORDER — POTASSIUM CHLORIDE CRYS ER 20 MEQ PO TBCR
40.0000 meq | EXTENDED_RELEASE_TABLET | Freq: Once | ORAL | Status: AC
  Administered 2022-05-19: 40 meq via ORAL
  Filled 2022-05-19: qty 2

## 2022-05-19 MED ORDER — DILTIAZEM HCL ER COATED BEADS 180 MG PO CP24
180.0000 mg | ORAL_CAPSULE | Freq: Every day | ORAL | Status: DC
  Administered 2022-05-19: 180 mg via ORAL
  Filled 2022-05-19: qty 1

## 2022-05-19 MED ORDER — SODIUM CHLORIDE 0.9 % WEIGHT BASED INFUSION
1.0000 mL/kg/h | INTRAVENOUS | Status: DC
  Administered 2022-05-20: 1 mL/kg/h via INTRAVENOUS

## 2022-05-19 MED ORDER — NITROGLYCERIN 0.4 MG SL SUBL: 0.4000 mg | SUBLINGUAL_TABLET | SUBLINGUAL | Status: DC | PRN

## 2022-05-19 MED ORDER — ONDANSETRON HCL 4 MG/2ML IJ SOLN: 4.0000 mg | Freq: Four times a day (QID) | INTRAMUSCULAR | Status: DC | PRN

## 2022-05-19 MED ORDER — ROSUVASTATIN CALCIUM 20 MG PO TABS: 20.0000 mg | ORAL_TABLET | Freq: Every evening | ORAL | Status: DC

## 2022-05-19 MED ORDER — LEVOTHYROXINE SODIUM 100 MCG PO TABS
100.0000 ug | ORAL_TABLET | Freq: Every day | ORAL | Status: DC
  Administered 2022-05-20 – 2022-05-21 (×2): 100 ug via ORAL
  Filled 2022-05-19 (×2): qty 1

## 2022-05-19 MED ORDER — ASPIRIN 81 MG PO CHEW
81.0000 mg | CHEWABLE_TABLET | ORAL | Status: AC
  Administered 2022-05-20: 81 mg via ORAL
  Filled 2022-05-19: qty 1

## 2022-05-19 MED ORDER — ROSUVASTATIN CALCIUM 20 MG PO TABS
20.0000 mg | ORAL_TABLET | Freq: Every day | ORAL | Status: DC
  Administered 2022-05-19 – 2022-05-21 (×3): 20 mg via ORAL
  Filled 2022-05-19 (×3): qty 1

## 2022-05-19 MED ORDER — MAGNESIUM OXIDE -MG SUPPLEMENT 400 (240 MG) MG PO TABS
400.0000 mg | ORAL_TABLET | Freq: Every day | ORAL | Status: DC
  Administered 2022-05-19 – 2022-05-20 (×2): 400 mg via ORAL
  Filled 2022-05-19 (×5): qty 1

## 2022-05-19 MED ORDER — FUROSEMIDE 10 MG/ML IJ SOLN
20.0000 mg | Freq: Once | INTRAMUSCULAR | Status: AC
  Administered 2022-05-19: 20 mg via INTRAVENOUS
  Filled 2022-05-19: qty 2

## 2022-05-19 MED ORDER — ESCITALOPRAM OXALATE 10 MG PO TABS
20.0000 mg | ORAL_TABLET | Freq: Every day | ORAL | Status: DC
  Administered 2022-05-19 – 2022-05-21 (×3): 20 mg via ORAL
  Filled 2022-05-19 (×3): qty 2

## 2022-05-19 MED ORDER — PROSIGHT PO TABS
1.0000 | ORAL_TABLET | Freq: Every day | ORAL | Status: DC
  Administered 2022-05-20 – 2022-05-21 (×2): 1 via ORAL
  Filled 2022-05-19 (×3): qty 1

## 2022-05-19 MED ORDER — OCUVITE-LUTEIN PO CAPS
1.0000 | ORAL_CAPSULE | Freq: Two times a day (BID) | ORAL | Status: DC
  Administered 2022-05-19: 1 via ORAL
  Filled 2022-05-19 (×2): qty 1

## 2022-05-19 MED ORDER — SODIUM CHLORIDE 0.9 % WEIGHT BASED INFUSION
3.0000 mL/kg/h | INTRAVENOUS | Status: DC
  Administered 2022-05-20: 3 mL/kg/h via INTRAVENOUS

## 2022-05-19 MED ORDER — ALUM & MAG HYDROXIDE-SIMETH 200-200-20 MG/5ML PO SUSP
30.0000 mL | Freq: Once | ORAL | Status: AC
  Administered 2022-05-19: 30 mL via ORAL
  Filled 2022-05-19: qty 30

## 2022-05-19 MED ORDER — ASPIRIN 81 MG PO TBEC
81.0000 mg | DELAYED_RELEASE_TABLET | Freq: Every day | ORAL | Status: DC
  Administered 2022-05-20 – 2022-05-21 (×2): 81 mg via ORAL
  Filled 2022-05-19 (×2): qty 1

## 2022-05-19 NOTE — Progress Notes (Signed)
*  PRELIMINARY RESULTS* Echocardiogram 2D Echocardiogram has been performed.  Renee Benitez 05/19/2022, 12:54 PM

## 2022-05-19 NOTE — Progress Notes (Signed)
ANTICOAGULATION CONSULT NOTE - Initial Consult  Pharmacy Consult for heparin Indication: chest pain/ACS  No Known Allergies  Patient Measurements: Height: 5\' 6"  (167.6 cm) Weight: 88 kg (194 lb) IBW/kg (Calculated) : 59.3 Heparin Dosing Weight: 78 Kg  Vital Signs: Temp: 97.7 F (36.5 C) (02/06 2219) Temp Source: Oral (02/06 2219) BP: 120/89 (02/07 0332) Pulse Rate: 63 (02/07 0332)  Labs: Recent Labs    05/18/22 2254 05/19/22 0245  HGB 14.3  --   HCT 42.1  --   PLT 250  --   CREATININE 0.95  --   TROPONINIHS  --  989*    Estimated Creatinine Clearance: 62.5 mL/min (by C-G formula based on SCr of 0.95 mg/dL).   Medical History: Past Medical History:  Diagnosis Date   Atrial fibrillation [facility])    Current use of estrogen therapy 01/31/2013   Dysrhythmia    AFib   Hypertension    Macular degeneration    Menopausal syndrome    Right ovarian cyst 01/25/2014      Assessment: 24 yoF presenting with chest discomfort. Troponin elevated at 989. Patient on apixaban prior to admission (last dose 2/6 @ 2030). CBC WNL. Pharmacy consulted to dose heparin.   Goal of Therapy:  Heparin level 0.3-0.7 units/ml aPTT 66-102 seconds Monitor platelets by anticoagulation protocol: Yes   Plan:  Give 2000 units bolus x 1 (reduced given apixaban last evening) Start heparin infusion at 1000 units/hr Check anti-Xa level in 6 hours and daily while on heparin Continue to monitor H&H and platelets  Georga Bora, PharmD Clinical Pharmacist 05/19/2022 5:40 AM Please check AMION for all [place] numbers

## 2022-05-19 NOTE — H&P (Addendum)
Cardiology Admission History and Physical   Patient ID: MAHARI VANKIRK MRN: [id number]; DOB: 1950/09/11   Admission date: 05/18/2022  PCP:  Caren Macadam, [place] Providers Cardiologist:  None  Electrophysiologist:  Cristopher Peru, MD       Chief Complaint:  "indigestion"  Patient Profile:   Renee Benitez is a 72 y.o. 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 Radford Pax, early macular degeneration who is being seen 05/19/2022 for the evaluation of NSTEMI at the request of Dr. Roderic Palau.   History of Present Illness:   Renee Benitez follows with Dr. Lovena Le for the above EP history. She has no prior h/o CAD. Last ischemic eval while on flecainide was by normal ETT in 2018. Last echo 12/2020 EF >75%, moderate LVH, G2DD, mild LAE, mild-moderate AI, aortic sclerosis without stenosis, completed in setting of CVA. No hx tobacco use. Father had bypass later in life. She has been in Renee Benitez USOH lately except recently saw urology for OAB/bladder spasms and was started on Myrbetriq. She noted this could interact with Flecainide so sent in MyChart msg and was scheduled for nurse visit OV on 2/14 for repeat EKG.   Last night she went out to dinner and had some shrimp and grits - the cheese tasted off. Later that evening around 8pm she developed sensation of chest burning. She initially thought it was indigestion as she has h/o GI issues requiring PPI and dilation. She took 2 Tylenol and 2 GasX without relief. The discomfort eventually migrated up to Renee Benitez throat and persisted longer than usual so came to ER where initial troponin was 989. She  had some n/v here as well. No recent SOB, diaphoresis, breakthrough afib, syncope. She wanted to also mention a few days ago she had some persistent right shoulder pain but suspected MSK.  She received ASA 324mg  ASA, Maalox, morphine, zofran, and was started on heparin (last dose of Eliquis 2/6 at 2030). She states the Maalox helped but the morphine took away pain completely. She remains pain free this morning, VSS, in sinus rhythm. EKG shows NSR 71bpm, first degree AVB, NSIVCS QRS 154ms, nonspecific TW changes avL, V2 - QRS widening is new. Labs otherwise show mild leukocytosis of 13.5, Na 134, glucose 165. CXR shows increased interstitial markings, question mild-moderate interstitial edema, though no focal symptoms or exam findings to suggest HF.     Past Medical History:  Diagnosis Date   Atrial fibrillation [facility])    Current use of estrogen therapy 01/31/2013   Dysrhythmia    AFib   Hypertension    Macular degeneration    Menopausal syndrome    Overactive bladder    Right ovarian cyst 01/25/2014    Past Surgical History:  Procedure Laterality Date   A-FLUTTER ABLATION N/A 03/18/2021   Procedure: A-FLUTTER ABLATION;  Surgeon: Evans Lance, MD;  Location: [place] CV LAB;  Service: Cardiovascular;  Laterality: N/A;   ABDOMINAL HYSTERECTOMY     APPENDECTOMY     CARDIOVERSION N/A 09/14/2016   Procedure: CARDIOVERSION;  Surgeon: Satira Sark, MD;  Location: [facility];  Service: Cardiovascular;  Laterality: N/A;   CARDIOVERSION N/A 02/09/2021  Procedure: CARDIOVERSION;  Surgeon: [name], MD;  Location: [facility] ENDOSCOPY;  Service: Cardiovascular;  Laterality: N/A;   CATARACT EXTRACTION W/ INTRAOCULAR LENS  IMPLANT, BILATERAL Bilateral    COLONOSCOPY  12/04/2003   Friable anal canal otherwise normal rectum and colon   COLONOSCOPY N/A 03/19/2013   Dr. [name]: diverticulosis, hemorrhoids. next TCS 10 years   COLONOSCOPY N/A 05/02/2019   entire colon normal, non-bleeding  external and internal hemorrhoids, grade 3. 5 year screening due to family history.   ESOPHAGOGASTRODUODENOSCOPY N/A 10/17/2018   moderately severe erosive esophagitis, esophagus status post dilation, small hiatal hernia, otherwise normal   IR ANGIO INTRA EXTRACRAN SEL INTERNAL CAROTID BILAT MOD SED  06/16/2021   IR ANGIO VERTEBRAL SEL VERTEBRAL UNI R MOD SED  06/16/2021   IR CT HEAD LTD  01/05/2021   IR CT HEAD LTD  01/05/2021   IR INTRA CRAN STENT  01/05/2021   IR PERCUTANEOUS ART THROMBECTOMY/INFUSION INTRACRANIAL INC DIAG ANGIO  01/05/2021   IR US GUIDE VASC ACCESS LEFT  01/05/2021   IR US GUIDE VASC ACCESS RIGHT  06/16/2021   MALONEY DILATION N/A 10/17/2018   Procedure: Elease Hashimoto DILATION;  Surgeon: [name], MD;  Location: [facility];  Service: Endoscopy;  Laterality: N/A;   RADIOLOGY WITH ANESTHESIA N/A 01/05/2021   Procedure: IR WITH ANESTHESIA;  Surgeon: Radiologist, Medication, MD;  Location: [facility] OR;  Service: Radiology;  Laterality: N/A;   TEE WITHOUT CARDIOVERSION N/A 09/14/2016   Procedure: TRANSESOPHAGEAL ECHOCARDIOGRAM (TEE) WITH PROPOFOL;  Surgeon: [name], MD;  Location: [facility];  Service: Cardiovascular;  Laterality: N/A;   TONSILECTOMY, ADENOIDECTOMY, BILATERAL MYRINGOTOMY AND TUBES       Medications Prior to Admission: Prior to Admission medications   Medication Sig Start Date End Date Taking? Authorizing Provider  acetaminophen (TYLENOL) 500 MG tablet Take 500-1,000 mg by mouth every 6 (six) hours as needed for moderate pain or headache.    [provider]  aspirin 81 MG EC tablet Take 1 tablet (81 mg total) by mouth daily. 01/15/21   Bailey-Modzik, Delila A, NP  Calcium Carb-Cholecalciferol (CALCIUM 600 + D PO) Take 1 tablet by mouth every evening.    [provider]  Cholecalciferol (DIALYVITE VITAMIN D 5000) 125 MCG (5000 UT) capsule Take 5,000 Units by mouth every evening.    [provider]  diltiazem (CARDIZEM CD) 180 MG  24 hr capsule Take 1 capsule (180 mg total) by mouth daily. 10/07/21   [name], MD  diltiazem (CARDIZEM) 60 MG tablet Take 0.5-1 tablets (30-60 mg total) by mouth daily as needed (afib). 10/07/21   [name], MD  ELIQUIS 5 MG TABS tablet TAKE ONE TABLET (5MG  TOTAL) BY MOUTH TWOTIMES DAILY 09/25/21   Fenton, Clint R, Renee  escitalopram (LEXAPRO) 20 MG tablet Take 20 mg by mouth daily. 04/15/21   [provider]  flecainide (TAMBOCOR) 100 MG tablet Take 1 tablet (100 mg total) by mouth 2 (two) times daily. 10/07/21   10/09/21, MD  levothyroxine (SYNTHROID) 100 MCG tablet Take 100 mcg by mouth every morning. 01/13/22   [provider]  loratadine (CLARITIN) 10 MG tablet Take 10 mg by mouth daily as needed for allergies.    [provider]  losartan (COZAAR) 25 MG tablet Take 25 mg by mouth every evening. 01/20/21   [provider]  Magnesium 250 MG TABS Take 250 mg by mouth in the morning and at bedtime.    [provider]  magnesium oxide (MAG-OX) 400 MG tablet Take 400 mg by mouth at bedtime.    [provider]  Multiple Vitamins-Minerals (MULTIVITAMIN WITH MINERALS) tablet Take 1 tablet by mouth in the morning. Women's One A Day    [provider]  Multiple Vitamins-Minerals (PRESERVISION AREDS 2) CAPS Take 1 capsule by mouth 2 (two) times daily.    [provider]  Omega-3 Fatty Acids (FISH OIL ULTRA) 1400 MG CAPS Take 1,400 mg by mouth in the morning.    [provider]  pantoprazole (PROTONIX) 40 MG tablet TAKE ONE TABLET (40MG  TOTAL) BY MOUTH DAILY 07/01/21   07/03/21, NP  rosuvastatin (CRESTOR) 20 MG tablet Take 20 mg by mouth in the morning. 01/20/21   [provider]     Allergies:   No Known Allergies  Social History:   Social History   Socioeconomic History   Marital status: Married    Spouse name: Not on file   Number of children: Not on file   Years of education: Not on file    Highest education level: Not on file  Occupational History   Occupation: Nurse    Comment: [employer]  Tobacco Use   Smoking status: Never   Smokeless tobacco: Never   Tobacco comments:    Never smoked  Vaping Use   Vaping Use: Never used  Substance and Sexual Activity   Alcohol use: Not Currently    Comment: previous occasional, now none   Drug use: No   Sexual activity: Yes    Birth control/protection: Surgical    Comment: hyst  Other Topics Concern   Not on file  Social History Narrative   Not on file   Social Determinants of Health   Financial Resource Strain: Low Risk  (07/16/2020)   Overall Financial Resource Strain (CARDIA)    Difficulty of Paying Living Expenses: Not hard at all  Food Insecurity: No Food Insecurity (07/16/2020)   Hunger Vital Sign    Worried About Running Out of Food in the Last Year: Never true    Ran Out of Food in the Last Year: Never true  Transportation Needs: No Transportation Needs (07/16/2020)   PRAPARE - 09/15/2020 (Medical): No    Lack of Transportation (Non-Medical): No  Physical Activity: Insufficiently Active (07/16/2020)   Exercise Vital Sign    Days of Exercise per Week: 1 day    Minutes of Exercise per Session: 30 min  Stress: Stress Concern Present (07/16/2020)   09/15/2020 of Occupational Health - Occupational Stress Questionnaire    Feeling of Stress : To some extent  Social Connections: Socially Integrated (07/16/2020)   Social Connection and Isolation Panel [NHANES]    Frequency of Communication with Friends and Family: Three times a week    Frequency of Social Gatherings with Friends and Family: Twice a week    Attends Religious Services: More than 4 times per year    Active Member of 09/15/2020 or Organizations: Yes    Attends [employer]: More than 4 times per year    Marital Status: Married  Engineer, structural Violence: Not At Risk (07/16/2020)   Humiliation, Afraid, Rape,  and Kick questionnaire    Fear of Current or Ex-Partner: No    Emotionally Abused: No    Physically Abused: No    Sexually Abused: No    Family History:   The patient's family history includes Arrhythmia in Renee Benitez sister; Cancer  in Renee Benitez father; Colon cancer in Renee Benitez maternal grandmother; Colon cancer (age of onset: 15) in Renee Benitez sister; Coronary artery disease in Renee Benitez father; Hypertension in Renee Benitez sister and son.    ROS:  Please see the history of present illness.  All other ROS reviewed and negative.     Physical Exam/Data:   Vitals:   05/19/22 0332 05/19/22 0700 05/19/22 0800 05/19/22 0852  BP: 120/89 113/80 127/72   Pulse: 63 66 70   Resp: 18 (!) 24 19   Temp:    98.1 F (36.7 C)  TempSrc:    Oral  SpO2: 96% 90% 99%   Weight:      Height:       No intake or output data in the 24 hours ending 05/19/22 0914    05/18/2022   10:21 PM 01/20/2022   10:32 AM 10/07/2021    8:43 AM  Last 3 Weights  Weight (lbs) 194 lb 194 lb 9.6 oz 189 lb 3.2 oz  Weight (kg) 87.998 kg 88.27 kg 85.821 kg     Body mass index is 31.31 kg/m.  General: Well developed, well nourished, in no acute distress. Head: Normocephalic, atraumatic, sclera non-icteric, no xanthomas, nares are without discharge. Neck: Negative for carotid bruits. JVP not elevated. Lungs: Clear bilaterally to auscultation without wheezes, rales, or rhonchi. Breathing is unlabored. Heart: RRR S1 S2 without murmurs, rubs, or gallops.  Abdomen: Soft, non-tender, non-distended with normoactive bowel sounds. No rebound/guarding. Extremities: No clubbing or cyanosis. No edema. Distal pedal pulses are 2+ and equal bilaterally. Neuro: Alert and oriented X 3. Moves all extremities spontaneously. Psych:  Responds to questions appropriately with a normal affect.   EKG:  The EKG was personally reviewed and demonstrates:  NSR 71bpm, first degree AVB, NSIVCS QRS , nonspecific TW changes avL, V2 - QRS widening is new Telemetry:  Telemetry was  personally reviewed and demonstrates:  NSR, brief sinus pause <2 sec    Relevant CV Studies: 2D echo 12/2020     1. Left ventricular ejection fraction, by estimation, is >75%. The left  ventricle has hyperdynamic function. The left ventricle has no regional  wall motion abnormalities. There is moderate left ventricular hypertrophy.  Left ventricular diastolic  parameters are consistent with Grade II diastolic dysfunction  (pseudonormalization).   2. Right ventricular systolic function is normal. The right ventricular  size is normal. There is normal pulmonary artery systolic pressure.   3. Left atrial size was mildly dilated.   4. The mitral valve is normal in structure. Mild mitral valve  regurgitation. No evidence of mitral stenosis.   5. The aortic valve is calcified. Aortic valve regurgitation is mild to  moderate. Mild aortic valve sclerosis is present, with no evidence of  aortic valve stenosis.   6. The inferior vena cava is normal in size with greater than 50%  respiratory variability, suggesting right atrial pressure of 3 mmHg.   Conclusion(s)/Recommendation(s): No intracardiac source of embolism  detected on this transthoracic study. A transesophageal echocardiogram is  recommended to exclude cardiac source of embolism if clinically indicated.     Laboratory Data:  High Sensitivity Troponin:   Recent Labs  Lab 05/19/22 0245 05/19/22 0811  TROPONINIHS 989* 1,756*      Chemistry Recent Labs  Lab 05/18/22 2254 05/19/22 0811  NA 134*  --   K 3.8  --   CL 100  --   CO2 19*  --   GLUCOSE 165*  --   BUN 19  --  CREATININE 0.95  --   CALCIUM 9.3  --   MG  --  2.2  GFRNONAA >60  --   ANIONGAP 15  --     Recent Labs  Lab 05/18/22 2254  PROT 7.5  ALBUMIN 4.3  AST 31  ALT 25  ALKPHOS 87  BILITOT 0.9   Lipids No results for input(s): "CHOL", "TRIG", "HDL", "LABVLDL", "LDLCALC", "CHOLHDL" in the last 168 hours. Hematology Recent Labs  Lab  05/18/22 2254  WBC 13.5*  RBC 4.78  HGB 14.3  HCT 42.1  MCV 88.1  MCH 29.9  MCHC 34.0  RDW 12.6  PLT 250   Thyroid No results for input(s): "TSH", "FREET4" in the last 168 hours. BNPNo results for input(s): "BNP", "PROBNP" in the last 168 hours.  DDimer No results for input(s): "DDIMER" in the last 168 hours.   Radiology/Studies:  [facility] Chest Port 1 View  Result Date: 05/19/2022 CLINICAL DATA:  Chest pain. EXAM: PORTABLE CHEST 1 VIEW COMPARISON:  November 07, 2018 FINDINGS: The heart size and mediastinal contours are within normal limits. Diffuse, mild to moderate severity increased interstitial lung markings are noted. There is no evidence of focal consolidation, pleural effusion or pneumothorax. The visualized skeletal structures are unremarkable. IMPRESSION: Findings which may represent mild to moderate severity interstitial edema. Electronically Signed   By: Virgina Norfolk M.D.   On: 05/19/2022 03:05     Assessment and Plan:   1. NSTEMI - admit orders placed to [facility], [facility] called - Eliquis held in ER and transitioned to IV heparin - case discussed with Dr. Harl Bowie, plan LHC in AM given last dose Eliquis last night - stop flecainide given MI and QRS widening - continue ASA 81mg  daily - got 324mg  already today around 3am - will hold off beta blocker for now pending assessment of coronaries; continue home diltiazem - continue statin and check lipids in AM - also repeated troponin to trend - will review CXR result with MD - no clinical signs of HF on exam, will add BNP to labs - if unrevealing may need to consider non urgent CT chest to exclude occult ILD   2. H/o PAF/atrial flutter, with prior CVA - hold Eliquis as above - d/c flecainide given concern for ACS and QRS widening, will hold off on Myrbetriq as well for the time being - K 3.8, Mag pending (per pharmD she is on MagOx 400mg  daily which we will continue) - was hyperkalemic by previous labs in 06/2021 so will repeat  lytes in AM - will need EP follow-up after DC to review alternative AAD if CAD is confirmed   3. Essential HTN - controlled, continue diltiazem and losartan   4. Hyperlipidemia - continue rosuvastatin - lipid panel in AM   5. Leukocytosis - ?stress demargination - no infective sx, follow - ED ordered UA which is still pending   6. OSA - will order CPAP QHS   6. Hyperglycemia - check A1C (Na 134 corrects to normal for glucose)   7. Mild-moderate AI by echo 2022 - repeat echo   DVT ppx: heparin per pharmacy Nutrition: OK to eat today, NPO after MN for cath except sips with meds Code status: full code, confirmed with pt    Risk Assessment/Risk Scores:     TIMI Risk Score for Unstable Angina or Non-ST Elevation MI:   The patient's TIMI risk score is 3, which indicates a 13% risk of all cause mortality, new or recurrent myocardial infarction or need  for urgent revascularization in the next 14 days.     CHA2DS2-VASc Score = 6   This indicates a 9.7% annual risk of stroke. The patient's score is based upon: CHF History: 0 HTN History: 1 Diabetes History: 0 Stroke History: 2 Vascular Disease History: 1 - potentially with this admission Age Score: 1 Gender Score: 1       Severity of Illness: The appropriate patient status for this patient is INPATIENT. Inpatient status is judged to be reasonable and necessary in order to provide the required intensity of service to ensure the patient's safety. The patient's presenting symptoms, physical exam findings, and initial radiographic and laboratory data in the context of their chronic comorbidities is felt to place them at high risk for further clinical deterioration. Furthermore, it is not anticipated that the patient will be medically stable for discharge from the hospital within 2 midnights of admission.   * I certify that at the point of admission it is my clinical judgment that the patient will require inpatient hospital care  spanning beyond 2 midnights from the point of admission due to high intensity of service, high risk for further deterioration and high frequency of surveillance required.*   For questions or updates, please contact [place] Please consult [email or web address] for contact info under     Signed, Charlie Pitter, Renee-C  05/19/2022 9:14 AM   Attending note Patient seen and discussed with Renee Benitez, I agree with Renee Benitez documentation. 72 yo female history of afib/aflutter on flecanide, s/p aflutter ablation, OSA, presents with chest pain.   K 3.8 Cr 0.95 BUN 19 Lipase 41 WBC 13.5 Plt 250  Trop 989--> EKG: SR, LBBB CXR possible mild to mod edema    1.NSTEMI - initial trop 989, peak not established yet. EKG LBBB - echo pending - medical therapy with hep gtt, ASA 81, losartan 25, crestor 20. Continue Renee Benitez home dilt for now may transition to beta blocker later in admit - plan for cath tomorrow, last took eliquis 05/18/22 in evening. Go ahead and [facility] to [facility] today to cards service.  - slight changes on CXR, does not appear volume overloaded. F/u LVEDP on cath, hold on any diuretics today.    2.AFib/Aflutter - EKG today shows SR, LBBB with widened QRS from baseline - in setting of ACS will d/c Renee Benitez home flecanide, continue home dilt for now - eliquis on hold for cath.      Carlyle Dolly MD

## 2022-05-19 NOTE — Progress Notes (Signed)
ANTICOAGULATION CONSULT NOTE   Pharmacy Consult for heparin Indication: chest pain/ACS  No Known Allergies  Patient Measurements: Height: 5\' 6"  (167.6 cm) Weight: 88 kg (194 lb) IBW/kg (Calculated) : 59.3 Heparin Dosing Weight: 78 Kg  Vital Signs: Temp: 97.7 F (36.5 C) (02/07 1420) Temp Source: Oral (02/07 1420) BP: 126/79 (02/07 1420) Pulse Rate: 69 (02/07 1305)  Labs: Recent Labs    05/18/22 2254 05/19/22 0245 05/19/22 0811 05/19/22 1117 05/19/22 1846  HGB 14.3  --   --   --   --   HCT 42.1  --   --   --   --   PLT 250  --   --   --   --   APTT  --   --   --  59* 70*  HEPARINUNFRC  --   --   --  >1.10*  --   CREATININE 0.95  --   --   --   --   TROPONINIHS  --  989* 1,756*  --   --      Estimated Creatinine Clearance: 62.5 mL/min (by C-G formula based on SCr of 0.95 mg/dL).   Medical History: Past Medical History:  Diagnosis Date   Atrial fibrillation [facility])    Current use of estrogen therapy 01/31/2013   Dysrhythmia    AFib   Hypertension    Macular degeneration    Menopausal syndrome    Overactive bladder    Right ovarian cyst 01/25/2014   Assessment:  34 yoF presenting with chest discomfort. Troponin elevated at 989. Patient on apixaban prior to admission (last dose 2/6 @ 2030). CBC WNL. Pharmacy consulted to dose heparin.  Repeat aPTT is therapeutic at 70 seconds. Level drawn early so anticipate it will increase, but will defer recheck until am since it was on the lower end of therapeutic range.   Goal of Therapy:  Heparin level 0.3-0.7 units/ml aPTT 66-102 seconds Monitor platelets by anticoagulation protocol: Yes   Plan:  Continue heparin 1150 units/h Confirmatory aPTT with am labs  Arrie Senate, PharmD, BCPS, [facility] Clinical Pharmacist [phone] Please check AMION for all [facility] Pharmacy numbers 05/19/2022

## 2022-05-19 NOTE — Progress Notes (Addendum)
BNP elevated. Per our discussion Dr. Branch ordered IV Lasix 20mg x1. Will also supp KCl. For now we'll leave plan for LHC but if EF down on echo would consider adding RHC. I also cancelled the nurse visit that was scheduled 2/14 for now since we have stopped flecainide this admission (and also had QRS widening on EKG).  Addendum: EF down. Per d/w Dr. Branch will also plan RHC with LHC. Msg sent to Trish to help adjust on schedule. He also suggests to d/c diltiazem, starting metoprolol tartrate 25mg BID, given finding of LV dysfunction - first dose queued up for AM since she had long acting diltiazem this AM. Patient has since been transferred so I also updated her by phone. She does report prior h/o bradycardia while on metoprolol in addition to diltiazem 180mg but we discussed the metoprolol will be in lieu of diltiazem not added onto it. Continue telemetry and plan otherwise as outlined. Updated consent order as well to reflect addition of RHC.  I also asked her to clarify her magnesium rx since original intake had a 250mg BID dose as well as 400mg dose. We have ordered the 400mg QHS dose. She will have someone check the bottle at home and let her nurse know so our team at [facility] can re-order appropriately. Mg level was normal so would likely plan to continue whatever her home regimen was. 

## 2022-05-19 NOTE — Consult Note (Deleted)
Note type entered in error - changed to H&P.

## 2022-05-19 NOTE — Progress Notes (Incomplete)
Attending note Patient seen and discussed with PA Dunn, I agree with her documentation. 72 yo female history of afib/aflutter on flecanide, s/p aflutter ablation, OSA, presents with chest pain.   K 3.8 Cr 0.95 BUN 19 Lipase 41 WBC 13.5 Plt 250  Trop 989--> EKG: SR, LBBB CXR possible mild to mod edema    1.NSTEMI - initial trop 989, peak not established yet. EKG LBBB - echo pending - medical therapy with hep gtt, ASA 81, losartan 25, crestor 20. Continue her home dilt for now may transition to beta blocker later in admit - plan for cath tomorrow, last took eliquis 05/18/22 in evening. Go ahead and transfer to [facility] today to cards service.    2.AFib/Aflutter - EKG today shows SR, LBBB with widened QRS from baseline - in setting of ACS will d/c her home flecanide, continue home dilt for now - eliquis on hold for cath.    Carlyle Dolly MD

## 2022-05-19 NOTE — H&P (View-Only) (Signed)
BNP elevated. Per our discussion Dr. Harl Bowie ordered IV Lasix 20mg  x1. Will also supp KCl. For now we'll leave plan for LHC but if EF down on echo would consider adding RHC. I also cancelled the nurse visit that was scheduled 2/14 for now since we have stopped flecainide this admission (and also had QRS widening on EKG).  Addendum: EF down. Per d/w Dr. Harl Bowie will also plan RHC with LHC. Msg sent to Trish to help adjust on schedule. He also suggests to d/c diltiazem, starting metoprolol tartrate 25mg  BID, given finding of LV dysfunction - first dose queued up for AM since she had long acting diltiazem this AM. Patient has since been transferred so I also updated her by phone. She does report prior h/o bradycardia while on metoprolol in addition to diltiazem 180mg  but we discussed the metoprolol will be in lieu of diltiazem not added onto it. Continue telemetry and plan otherwise as outlined. Updated consent order as well to reflect addition of RHC.  I also asked her to clarify her magnesium rx since original intake had a 250mg  BID dose as well as 400mg  dose. We have ordered the 400mg  QHS dose. She will have someone check the bottle at home and let her nurse know so our team at [facility] can re-order appropriately. Mg level was normal so would likely plan to continue whatever her home regimen was.

## 2022-05-19 NOTE — Progress Notes (Signed)
[place] for heparin Indication: chest pain/ACS  No Known Allergies  Patient Measurements: Height: 5\' 6"  (167.6 cm) Weight: 88 kg (194 lb) IBW/kg (Calculated) : 59.3 Heparin Dosing Weight: 78 Kg  Vital Signs: Temp: 98.1 F (36.7 C) (02/07 0852) Temp Source: Oral (02/07 0852) BP: 112/94 (02/07 1305) Pulse Rate: 69 (02/07 1305)  Labs: Recent Labs    05/18/22 2254 05/19/22 0245 05/19/22 0811 05/19/22 1117  HGB 14.3  --   --   --   HCT 42.1  --   --   --   PLT 250  --   --   --   APTT  --   --   --  59*  HEPARINUNFRC  --   --   --  >1.10*  CREATININE 0.95  --   --   --   TROPONINIHS  --  989* 1,756*  --     Estimated Creatinine Clearance: 62.5 mL/min (by C-G formula based on SCr of 0.95 mg/dL).   Medical History: Past Medical History:  Diagnosis Date   Atrial fibrillation [facility])    Current use of estrogen therapy 01/31/2013   Dysrhythmia    AFib   Hypertension    Macular degeneration    Menopausal syndrome    Overactive bladder    Right ovarian cyst 01/25/2014   Assessment: 87 yoF presenting with chest discomfort. Troponin elevated at 989. Patient on apixaban prior to admission (last dose 2/6 @ 2030). CBC WNL. Pharmacy consulted to dose heparin.  Heparin level >1.1 as expected with recent apixaban use. Aptt just below goal at 59s. No bleeding or IV issues noted.    Goal of Therapy:  Heparin level 0.3-0.7 units/ml aPTT 66-102 seconds Monitor platelets by anticoagulation protocol: Yes   Plan:  Increase heparin infusion to 1150 units/hr Check anti-Xa level in 6 hours and daily while on heparin Continue to monitor H&H and platelets  Erin Hearing PharmD., BCPS Clinical Pharmacist 05/19/2022 1:07 PM

## 2022-05-19 NOTE — ED Provider Notes (Signed)
[facility] Provider Note   CSN: [id number] Arrival date & time: 05/18/22  2213     History  Chief Complaint  Patient presents with   Gastroesophageal Reflux    Renee Benitez is a 72 y.o. female.  The history is provided by the patient.  Gastroesophageal Reflux  She has history of hypertension, atrial fibrillation anticoagulated on apixaban and comes in complaining of burning pain which starts in the epigastric area and radiates up through the chest to her throat.  This started after eating a dinner of shrimp and grits.  There has been associated nausea and she vomited once, also mild diaphoresis but no dyspnea pain does have some radiation to the back.  She did take a dose of aspirin 81 mg which has not affected the pain.  She does not recall having had pain like this before, but does have known gastroesophageal reflux.  However, she is very concerned that her urologist started her on mirabegron and she also takes flecainide and she noted that there is an interaction between them.  She denies history of diabetes, hyperlipidemia.  She is a non-smoker.   Home Medications Prior to Admission medications   Medication Sig Start Date End Date Taking? Authorizing Provider  acetaminophen (TYLENOL) 500 MG tablet Take 500-1,000 mg by mouth every 6 (six) hours as needed for moderate pain or headache.    [provider]  aspirin 81 MG EC tablet Take 1 tablet (81 mg total) by mouth daily. 01/15/21   Bailey-Modzik, Delila A, NP  Calcium Carb-Cholecalciferol (CALCIUM 600 + D PO) Take 1 tablet by mouth every evening.    [provider]  Cholecalciferol (DIALYVITE VITAMIN D 5000) 125 MCG (5000 UT) capsule Take 5,000 Units by mouth every evening.    [provider]  diltiazem (CARDIZEM CD) 180 MG 24 hr capsule Take 1 capsule (180 mg total) by mouth daily. 10/07/21   Evans Lance, MD  diltiazem (CARDIZEM) 60 MG tablet Take 0.5-1  tablets (30-60 mg total) by mouth daily as needed (afib). 10/07/21   Evans Lance, MD  ELIQUIS 5 MG TABS tablet TAKE ONE TABLET (5MG  TOTAL) BY MOUTH TWOTIMES DAILY 09/25/21   Fenton, Clint R, PA  escitalopram (LEXAPRO) 20 MG tablet Take 20 mg by mouth daily. 04/15/21   [provider]  flecainide (TAMBOCOR) 100 MG tablet Take 1 tablet (100 mg total) by mouth 2 (two) times daily. 10/07/21   Evans Lance, MD  levothyroxine (SYNTHROID) 100 MCG tablet Take 100 mcg by mouth every morning. 01/13/22   [provider]  loratadine (CLARITIN) 10 MG tablet Take 10 mg by mouth daily as needed for allergies.    [provider]  losartan (COZAAR) 25 MG tablet Take 25 mg by mouth every evening. 01/20/21   [provider]  Magnesium 250 MG TABS Take 250 mg by mouth in the morning and at bedtime.    [provider]  magnesium oxide (MAG-OX) 400 MG tablet Take 400 mg by mouth at bedtime.    [provider]  Multiple Vitamins-Minerals (MULTIVITAMIN WITH MINERALS) tablet Take 1 tablet by mouth in the morning. Women's One A Day    [provider]  Multiple Vitamins-Minerals (PRESERVISION AREDS 2) CAPS Take 1 capsule by mouth 2 (two) times daily.    [provider]  Omega-3 Fatty Acids (FISH OIL ULTRA) 1400 MG CAPS Take 1,400 mg by mouth in the morning.  [provider]  pantoprazole (PROTONIX) 40 MG tablet TAKE ONE TABLET (40MG  TOTAL) BY MOUTH DAILY 07/01/21   Annitta Needs, NP  rosuvastatin (CRESTOR) 20 MG tablet Take 20 mg by mouth in the morning. 01/20/21   [provider]      Allergies    Patient has no known allergies.    Review of Systems   Review of Systems  All other systems reviewed and are negative.   Physical Exam Updated Vital Signs BP (!) 146/86 (BP Location: Right Arm)   Pulse 71   Temp 97.7 F (36.5 C) (Oral)   Resp 18   Ht 5\' 6"  (1.676 m)   Wt 88 kg   SpO2 100%   BMI 31.31 kg/m  Physical  Exam Vitals and nursing note reviewed.   72 year old female, resting comfortably and in no acute distress. Vital signs are significant for borderline elevated blood pressure. Oxygen saturation is 100%, which is normal. Head is normocephalic and atraumatic. PERRLA, EOMI. Oropharynx is clear. Neck is nontender and supple without adenopathy or JVD. Back is nontender and there is no CVA tenderness. Lungs are clear without rales, wheezes, or rhonchi. Chest is nontender. Heart has regular rate and rhythm without murmur. Abdomen is soft, flat, with moderate epigastric tenderness and mild right upper quadrant tenderness with +/- Murphy sign. Extremities have no cyanosis or edema, full range of motion is present. Skin is warm and dry without rash. Neurologic: Mental status is normal, cranial nerves are intact, moves all extremities equally.  ED Results / Procedures / Treatments   Labs (all labs ordered are listed, but only abnormal results are displayed) Labs Reviewed  COMPREHENSIVE METABOLIC PANEL - Abnormal; Notable for the following components:      Result Value   Sodium 134 (*)    CO2 19 (*)    Glucose, Bld 165 (*)    All other components within normal limits  CBC - Abnormal; Notable for the following components:   WBC 13.5 (*)    All other components within normal limits  LIPASE, BLOOD  URINALYSIS, ROUTINE W REFLEX MICROSCOPIC    EKG ED ECG REPORT   Date: 05/19/2022  Rate: 71  Rhythm: normal sinus rhythm  QRS Axis: left  Intervals: normal  ST/T Wave abnormalities: normal  Conduction Disutrbances:nonspecific intraventricular conduction delay  Narrative Interpretation: Left axis deviation which was not present on ECG of 06/03/2021.  Also, slight increase in QRS duration which is nonspecific.  Old EKG Reviewed: changes noted  I have personally reviewed the EKG tracing and agree with the computerized printout as noted.  Radiology No results found.  Procedures Procedures     Medications Ordered in ED Medications - No data to display  ED Course/ Medical Decision Making/ A&P                             Medical Decision Making Amount and/or Complexity of Data Reviewed Labs: ordered. Radiology: ordered.  Risk OTC drugs. Prescription drug management. Decision regarding hospitalization.   Epigastric and chest pain which seems most likely to be GERD, but certainly need to consider possibility of ACS as well as pancreatitis or cholecystitis.  I doubt symptoms are related to interaction between flecainide and mirabegron.  I have reviewed and interpreted her electrocardiogram, and my interpretation is sinus rhythm with left axis deviation which is new compared with 06/03/2021.  No acute ST or T changes.  I have reviewed  and interpreted her laboratory tests, and my interpretation is mild hyponatremia which is not felt to be clinically significant, elevated postprandial glucose which will need to be followed as an outpatient, mild leukocytosis which is nonspecific.  Lipase is normal.  I have ordered troponin as well as dose of oral aspirin, intravenous ondansetron and a dose of an oral antacid.  I have reviewed her past records, and on 01/06/2021, she had an echocardiogram which showed left ventricular ejection fraction greater than 75% and grade 2 diastolic dysfunction.    Troponin has come back significantly elevated at 989.  Apparently non-STEMI is the cause of her discomfort.  She is already on apixaban, so cannot be started on heparin at this point.  I have ordered morphine for pain.  I have consulted cardiology at [facility].  I discussed the case with Dr. Humphrey Rolls, who agrees to accept the patient as an inpatient at [facility].  CRITICAL CARE Performed by: Delora Fuel Total critical care time: 145 minutes Critical care time was exclusive of separately billable procedures and treating other patients. Critical care was necessary to treat or prevent  imminent or life-threatening deterioration. Critical care was time spent personally by me on the following activities: development of treatment plan with patient and/or surrogate as well as nursing, discussions with consultants, evaluation of patient's response to treatment, examination of patient, obtaining history from patient or surrogate, ordering and performing treatments and interventions, ordering and review of laboratory studies, ordering and review of radiographic studies, pulse oximetry and re-evaluation of patient's condition.  Final Clinical Impression(s) / ED Diagnoses Final diagnoses:  Non-STEMI (non-ST elevated myocardial infarction) ([place])  Hyponatremia    Rx / DC Orders ED Discharge Orders     None         Delora Fuel, MD 50/53/97 [phone]

## 2022-05-20 ENCOUNTER — Other Ambulatory Visit (HOSPITAL_COMMUNITY): Payer: Self-pay

## 2022-05-20 ENCOUNTER — Encounter (HOSPITAL_COMMUNITY)
Admission: EM | Disposition: A | Discharge status: Home / Self Care | Payer: Self-pay | Source: Home / Self Care | Attending: Cardiology

## 2022-05-20 DIAGNOSIS — I10 Essential (primary) hypertension: Secondary | ICD-10-CM

## 2022-05-20 DIAGNOSIS — I480 Paroxysmal atrial fibrillation: Secondary | ICD-10-CM

## 2022-05-20 HISTORY — PX: RIGHT/LEFT HEART CATH AND CORONARY ANGIOGRAPHY: CATH118266

## 2022-05-20 LAB — LIPID PANEL
Cholesterol: 122 mg/dL (ref 0–200)
HDL: 52 mg/dL (ref >40)
LDL Cholesterol: 55 mg/dL (ref 0–99)
Total CHOL/HDL Ratio: 2.3 RATIO
Triglycerides: 73 mg/dL (ref <150)
VLDL: 15 mg/dL (ref 0–40)

## 2022-05-20 LAB — POCT I-STAT 7, (LYTES, BLD GAS, ICA,H+H)
Acid-base deficit: 2 mmol/L (ref 0.0–2.0)
Bicarbonate: 22.6 mmol/L (ref 20.0–28.0)
Calcium, Ion: 1.21 mmol/L (ref 1.15–1.40)
HCT: 36 % (ref 36.0–46.0)
Hemoglobin: 12.2 g/dL (ref 12.0–15.0)
O2 Saturation: 92 %
Potassium: 4.1 mmol/L (ref 3.5–5.1)
Sodium: 139 mmol/L (ref 135–145)
TCO2: 24 mmol/L (ref 22–32)
pCO2 arterial: 36.1 mmHg (ref 32–48)
pH, Arterial: 7.405 (ref 7.35–7.45)
pO2, Arterial: 63 mmHg — ABNORMAL LOW (ref 83–108)

## 2022-05-20 LAB — CBC
HCT: 37.3 % (ref 36.0–46.0)
Hemoglobin: 12.6 g/dL (ref 12.0–15.0)
MCH: 30.4 pg (ref 26.0–34.0)
MCHC: 33.8 g/dL (ref 30.0–36.0)
MCV: 90.1 fL (ref 80.0–100.0)
Platelets: 190 10*3/uL (ref 150–400)
RBC: 4.14 MIL/uL (ref 3.87–5.11)
RDW: 12.9 % (ref 11.5–15.5)
WBC: 11.8 10*3/uL — ABNORMAL HIGH (ref 4.0–10.5)
nRBC: 0 % (ref 0.0–0.2)

## 2022-05-20 LAB — POCT I-STAT EG7
Acid-base deficit: 1 mmol/L (ref 0.0–2.0)
Acid-base deficit: 1 mmol/L (ref 0.0–2.0)
Bicarbonate: 24.2 mmol/L (ref 20.0–28.0)
Bicarbonate: 24.6 mmol/L (ref 20.0–28.0)
Calcium, Ion: 1.21 mmol/L (ref 1.15–1.40)
Calcium, Ion: 1.23 mmol/L (ref 1.15–1.40)
HCT: 36 % (ref 36.0–46.0)
HCT: 37 % (ref 36.0–46.0)
Hemoglobin: 12.2 g/dL (ref 12.0–15.0)
Hemoglobin: 12.6 g/dL (ref 12.0–15.0)
O2 Saturation: 57 %
O2 Saturation: 54 %
Potassium: 4.2 mmol/L (ref 3.5–5.1)
Potassium: 4.2 mmol/L (ref 3.5–5.1)
Sodium: 139 mmol/L (ref 135–145)
Sodium: 139 mmol/L (ref 135–145)
TCO2: 25 mmol/L (ref 22–32)
TCO2: 26 mmol/L (ref 22–32)
pCO2, Ven: 40.9 mmHg — ABNORMAL LOW (ref 44–60)
pCO2, Ven: 41.6 mmHg — ABNORMAL LOW (ref 44–60)
pH, Ven: 7.381 (ref 7.25–7.43)
pH, Ven: 7.379 (ref 7.25–7.43)
pO2, Ven: 31 mmHg — CL (ref 32–45)
pO2, Ven: 29 mmHg — CL (ref 32–45)

## 2022-05-20 LAB — APTT: aPTT: 69 seconds — ABNORMAL HIGH (ref 24–36)

## 2022-05-20 LAB — BASIC METABOLIC PANEL
Anion gap: 11 (ref 5–15)
BUN: 23 mg/dL (ref 8–23)
CO2: 24 mmol/L (ref 22–32)
Calcium: 8.6 mg/dL — ABNORMAL LOW (ref 8.9–10.3)
Chloride: 101 mmol/L (ref 98–111)
Creatinine, Ser: 1.13 mg/dL — ABNORMAL HIGH (ref 0.44–1.00)
GFR, Estimated: 53 mL/min — ABNORMAL LOW (ref >60)
Glucose, Bld: 120 mg/dL — ABNORMAL HIGH (ref 70–99)
Potassium: 3.6 mmol/L (ref 3.5–5.1)
Sodium: 136 mmol/L (ref 135–145)

## 2022-05-20 LAB — HIV ANTIBODY (ROUTINE TESTING W REFLEX): HIV Screen 4th Generation wRfx: NONREACTIVE

## 2022-05-20 LAB — TROPONIN I (HIGH SENSITIVITY): Troponin I (High Sensitivity): 1131 ng/L (ref <18)

## 2022-05-20 LAB — BRAIN NATRIURETIC PEPTIDE: B Natriuretic Peptide: 921.5 pg/mL — ABNORMAL HIGH (ref 0.0–100.0)

## 2022-05-20 LAB — HEPARIN LEVEL (UNFRACTIONATED): Heparin Unfractionated: >1.1 IU/mL — ABNORMAL HIGH (ref 0.30–0.70)

## 2022-05-20 LAB — MAGNESIUM: Magnesium: 2.1 mg/dL (ref 1.7–2.4)

## 2022-05-20 SURGERY — RIGHT/LEFT HEART CATH AND CORONARY ANGIOGRAPHY
Anesthesia: LOCAL

## 2022-05-20 MED ORDER — LIDOCAINE HCL (PF) 1 % IJ SOLN
INTRAMUSCULAR | Status: AC
  Filled 2022-05-20: qty 30

## 2022-05-20 MED ORDER — LIDOCAINE HCL (PF) 1 % IJ SOLN
INTRAMUSCULAR | Status: DC | PRN
  Administered 2022-05-20: 2 mL

## 2022-05-20 MED ORDER — ACETAMINOPHEN 325 MG PO TABS: 650.0000 mg | ORAL_TABLET | ORAL | Status: DC | PRN

## 2022-05-20 MED ORDER — VERAPAMIL HCL 2.5 MG/ML IV SOLN
INTRAVENOUS | Status: AC
  Filled 2022-05-20: qty 2

## 2022-05-20 MED ORDER — POTASSIUM CHLORIDE CRYS ER 20 MEQ PO TBCR
20.0000 meq | EXTENDED_RELEASE_TABLET | Freq: Once | ORAL | Status: AC
  Administered 2022-05-20: 20 meq via ORAL
  Filled 2022-05-20: qty 1

## 2022-05-20 MED ORDER — SODIUM CHLORIDE 0.9% FLUSH: 3.0000 mL | INTRAVENOUS | Status: DC | PRN

## 2022-05-20 MED ORDER — APIXABAN 5 MG PO TABS
5.0000 mg | ORAL_TABLET | Freq: Two times a day (BID) | ORAL | Status: DC
  Administered 2022-05-20 – 2022-05-21 (×2): 5 mg via ORAL
  Filled 2022-05-20 (×2): qty 1

## 2022-05-20 MED ORDER — VERAPAMIL HCL 2.5 MG/ML IV SOLN: INTRA_ARTERIAL | Status: DC | PRN

## 2022-05-20 MED ORDER — MORPHINE SULFATE (PF) 2 MG/ML IV SOLN: 2.0000 mg | INTRAVENOUS | Status: DC | PRN

## 2022-05-20 MED ORDER — SODIUM CHLORIDE 0.9 % IV SOLN: INTRAVENOUS | Status: AC

## 2022-05-20 MED ORDER — SODIUM CHLORIDE 0.9% FLUSH
3.0000 mL | Freq: Two times a day (BID) | INTRAVENOUS | Status: DC
  Administered 2022-05-20 – 2022-05-21 (×3): 3 mL via INTRAVENOUS

## 2022-05-20 MED ORDER — ONDANSETRON HCL 4 MG/2ML IJ SOLN: 4.0000 mg | Freq: Four times a day (QID) | INTRAMUSCULAR | Status: DC | PRN

## 2022-05-20 MED ORDER — HYDRALAZINE HCL 20 MG/ML IJ SOLN: 10.0000 mg | INTRAMUSCULAR | Status: AC | PRN

## 2022-05-20 MED ORDER — METOPROLOL TARTRATE 12.5 MG HALF TABLET
12.5000 mg | ORAL_TABLET | Freq: Two times a day (BID) | ORAL | Status: DC
  Administered 2022-05-20: 12.5 mg via ORAL
  Filled 2022-05-20 (×2): qty 1

## 2022-05-20 MED ORDER — SODIUM CHLORIDE 0.9 % IV SOLN: 250.0000 mL | INTRAVENOUS | Status: DC | PRN

## 2022-05-20 MED ORDER — HEPARIN SODIUM (PORCINE) 1000 UNIT/ML IJ SOLN
INTRAMUSCULAR | Status: DC | PRN
  Administered 2022-05-20: 4500 [IU] via INTRAVENOUS

## 2022-05-20 MED ORDER — IOHEXOL 350 MG/ML SOLN
INTRAVENOUS | Status: DC | PRN
  Administered 2022-05-20: 40 mL

## 2022-05-20 MED ORDER — LABETALOL HCL 5 MG/ML IV SOLN: 10.0000 mg | INTRAVENOUS | Status: AC | PRN

## 2022-05-20 MED ORDER — HEPARIN (PORCINE) IN NACL 1000-0.9 UT/500ML-% IV SOLN
INTRAVENOUS | Status: AC
  Filled 2022-05-20: qty 1000

## 2022-05-20 MED ORDER — NITROGLYCERIN 1 MG/10 ML FOR IR/CATH LAB
INTRA_ARTERIAL | Status: AC
  Filled 2022-05-20: qty 10

## 2022-05-20 MED ORDER — HEPARIN (PORCINE) IN NACL 1000-0.9 UT/500ML-% IV SOLN
INTRAVENOUS | Status: DC | PRN
  Administered 2022-05-20 (×2): 500 mL

## 2022-05-20 MED ORDER — HEPARIN SODIUM (PORCINE) 1000 UNIT/ML IJ SOLN
INTRAMUSCULAR | Status: AC
  Filled 2022-05-20: qty 10

## 2022-05-20 MED ORDER — SPIRONOLACTONE 12.5 MG HALF TABLET
12.5000 mg | ORAL_TABLET | Freq: Every day | ORAL | Status: DC
  Administered 2022-05-20: 12.5 mg via ORAL
  Filled 2022-05-20 (×2): qty 1

## 2022-05-20 MED ORDER — FUROSEMIDE 10 MG/ML IJ SOLN
20.0000 mg | Freq: Once | INTRAMUSCULAR | Status: AC
  Administered 2022-05-20: 20 mg via INTRAVENOUS
  Filled 2022-05-20: qty 2

## 2022-05-20 SURGICAL SUPPLY — 15 items
CATH 5FR JL3.5 JR4 ANG PIG MP (CATHETERS) IMPLANT
CATH BALLN WEDGE 5F 110CM (CATHETERS) IMPLANT
DEVICE RAD COMP TR BAND LRG (VASCULAR PRODUCTS) IMPLANT
GLIDESHEATH SLEND A-KIT 6F 22G (SHEATH) IMPLANT
GUIDEWIRE INQWIRE 1.5J.035X260 (WIRE) IMPLANT
INQWIRE 1.5J .035X260CM (WIRE) ×1
KIT HEART LEFT (KITS) ×2 IMPLANT
PACK CARDIAC CATHETERIZATION (CUSTOM PROCEDURE TRAY) ×2 IMPLANT
PROTECTION STATION PRESSURIZED (MISCELLANEOUS) ×1
SHEATH GLIDE SLENDER 4/5FR (SHEATH) IMPLANT
STATION PROTECTION PRESSURIZED (MISCELLANEOUS) IMPLANT
TRANSDUCER W/STOPCOCK (MISCELLANEOUS) ×2 IMPLANT
TUBING CIL FLEX 10 FLL-RA (TUBING) ×2 IMPLANT
WIRE EMERALD 3MM-J .025X260CM (WIRE) IMPLANT
WIRE HI TORQ VERSACORE-J 145CM (WIRE) IMPLANT

## 2022-05-20 NOTE — Care Management (Signed)
  Transition of Care [facility]) Screening Note   Patient Details  Name: Renee Benitez Date of Birth: 02/11/1951   Transition of [facility]) CM/SW Contact:    Bethena Roys, RN Phone Number: 05/20/2022, 1:09 PM    Transition of Care Department [facility]) has reviewed the patient and no TOC needs have been identified at this time. We will continue to monitor patient advancement through interdisciplinary progression rounds. If new patient transition needs arise, please place a TOC consult.

## 2022-05-20 NOTE — Progress Notes (Addendum)
   Heart Failure Stewardship Pharmacist Progress Note   PCP: Caren Macadam, MD PCP-Cardiologist: Carlyle Dolly, MD    HPI:  72 yo F with PMH of HTN, afib with prior ablation in 2022, CVA, OSA, HLD, and T2DM.  Presented to the ED on 2/6 with epigastric pain. CXR with mild to moderate interstitial edema. ECHO 2/7 showed LVEF newly reduced to 30-35% (was >75% in 2022), anteroseptal wall akinesis, regional wall motion abnormalities, mild LVH, G2DD, RV normal. Taken for [facility] on 2/8 and found to have mild CAD with 25% stenosis in prox LAD. RA 11, PA 31, wedge 22, CO 4.2, CI 2.2. Finding most consistent with Takotsubo cardiomyopathy.   Current HF Medications: Beta Blocker: metoprolol tartrate 25 mg BID ACE/ARB/ARNI: losartan 25 mg daily  Prior to admission HF Medications: ACE/ARB/ARNI: losartan 25 mg daily  Pertinent Lab Values: Serum creatinine 1.13, BUN 23, Potassium 4.2, Sodium 139, BNP 921.5, Magnesium 2.1, A1c 5.4   Vital Signs: Weight: 195 lbs (admission weight: 195 lbs) Blood pressure: 110-130/70s  Heart rate: 50-70s  I/O: incomplete  Medication Assistance / Insurance Benefits Check: Does the patient have prescription insurance?  Yes Type of insurance plan: [employer] (has Medicare A&B but commercial part D plan)  Outpatient Pharmacy:  Prior to admission outpatient pharmacy: [place] Is the patient willing to use [facility] at discharge? Yes Is the patient willing to transition their outpatient pharmacy to utilize a [facility] outpatient pharmacy?   Pending    Assessment: 1. Acute systolic CHF (LVEF 74-25%), due to NICM, Takotsubo cardiomyopathy. NYHA class II symptoms. - Consider adding IV lasix given elevated pressures on RHC today. - Continue metoprolol tartrate 25 mg BID, will need to consolidate to metoprolol succinate at discharge - Continue losartan 25 mg daily - Consider adding spironolactone and SGLT2i with patient assistance prior to discharge    Plan: 1) Medication changes recommended at this time: - Add IV lasix  2) Patient assistance: - Jardiance copay $177 - can use copay card, lowers to $10 per month - Farxiga not on insurance formulary - Entresto copay [phone] - can use copay card, lowers to $10 per fill  3)  Education  - Patient has been educated on current HF medications and potential additions to HF medication regimen - Patient verbalizes understanding that over the next few months, these medication doses may change and more medications may be added to optimize HF regimen - Patient has been educated on basic disease state pathophysiology and goals of therapy   Kerby Nora, PharmD, BCPS Heart Failure Stewardship Pharmacist Phone [phone]

## 2022-05-20 NOTE — Progress Notes (Addendum)
Rounding Note    Patient Name: Renee Benitez Date of Encounter: 05/20/2022  [facility] Cardiologist: Carlyle Dolly, MD   Subjective   Patient s/p LHC/RHC this morning. Denies chest pain, shortness of breath, palpitations. Says that she feels tired but otherwise normal.  Inpatient Medications    Scheduled Meds:  aspirin EC  81 mg Oral Daily   calcium-vitamin D  1 tablet Oral QPM   cholecalciferol  5,000 Units Oral QPM   escitalopram  20 mg Oral Daily   levothyroxine  100 mcg Oral Q0600   losartan  25 mg Oral QPM   magnesium oxide  400 mg Oral QHS   metoprolol tartrate  25 mg Oral BID   multivitamin  1 tablet Oral Daily   multivitamin with minerals  1 tablet Oral q AM   omega-3 acid ethyl esters  1,000 mg Oral Daily   pantoprazole  40 mg Oral Daily   rosuvastatin  20 mg Oral Daily   sodium chloride flush  3 mL Intravenous Q12H   sodium chloride flush  3 mL Intravenous Q12H   sodium chloride flush  3 mL Intravenous Q12H   spironolactone  12.5 mg Oral Daily   Continuous Infusions:  sodium chloride     sodium chloride     sodium chloride     PRN Meds: sodium chloride, sodium chloride, acetaminophen, acetaminophen, hydrALAZINE, labetalol, loratadine, morphine injection, nitroGLYCERIN, ondansetron (ZOFRAN) IV, ondansetron (ZOFRAN) IV, sodium chloride flush, sodium chloride flush   Vital Signs    Vitals:   05/20/22 0826 05/20/22 0831 05/20/22 0901 05/20/22 0904  BP: 124/73 124/73  136/84  Pulse: 64 (!) 0 (!) 0 60  Resp: 18   18  Temp:    98.2 F (36.8 C)  TempSrc:    Oral  SpO2: 90% 96%  96%  Weight:      Height:        Intake/Output Summary (Last 24 hours) at 05/20/2022 0954 Last data filed at 05/20/2022 0543 Gross per 24 hour  Intake 631.32 ml  Output --  Net 631.32 ml      05/20/2022    4:41 AM 05/18/2022   10:21 PM 01/20/2022   10:32 AM  Last 3 Weights  Weight (lbs) 195 lb 8.8 oz 194 lb 194 lb 9.6 oz  Weight (kg) 88.7 kg 87.998 kg 88.27 kg       Telemetry    Sinus rhythm, rare PVCs - Personally Reviewed  ECG    ECG today with sinus rhythm, PVC. QRS has narrowed in comparison to 2/6 tracing. Diffuse T wave inversions- Personally Reviewed  Physical Exam   GEN: No acute distress.   Neck: No JVD Cardiac: RRR, no murmurs, rubs, or gallops.  Respiratory: Clear to auscultation bilaterally. GI: Soft, nontender, non-distended  MS: No edema; No deformity. TR band in place right wrist Neuro:  Nonfocal  Psych: Normal affect   Labs    High Sensitivity Troponin:   Recent Labs  Lab 05/19/22 0245 05/19/22 0811 05/20/22 0448  TROPONINIHS 989* 1,756* 1,131*     Chemistry Recent Labs  Lab 05/18/22 2254 05/19/22 0811 05/20/22 0149 05/20/22 0819  NA 134*  --  136 139  K 3.8  --  3.6 4.2  CL 100  --  101  --   CO2 19*  --  24  --   GLUCOSE 165*  --  120*  --   BUN 19  --  23  --   CREATININE 0.95  --  1.13*  --   CALCIUM 9.3  --  8.6*  --   MG  --  2.2 2.1  --   PROT 7.5  --   --   --   ALBUMIN 4.3  --   --   --   AST 31  --   --   --   ALT 25  --   --   --   ALKPHOS 87  --   --   --   BILITOT 0.9  --   --   --   GFRNONAA >60  --  53*  --   ANIONGAP 15  --  11  --     Lipids  Recent Labs  Lab 05/20/22 0149  CHOL 122  TRIG 73  HDL 52  LDLCALC 55  CHOLHDL 2.3    Hematology Recent Labs  Lab 05/18/22 2254 05/20/22 0149 05/20/22 0819  WBC 13.5* 11.8*  --   RBC 4.78 4.14  --   HGB 14.3 12.6 12.2  HCT 42.1 37.3 36.0  MCV 88.1 90.1  --   MCH 29.9 30.4  --   MCHC 34.0 33.8  --   RDW 12.6 12.9  --   PLT 250 190  --    Thyroid  Recent Labs  Lab 05/19/22 0811  TSH 2.971    BNP Recent Labs  Lab 05/19/22 0926 05/20/22 0149  BNP 998.0* 921.5*    DDimer No results for input(s): "DDIMER" in the last 168 hours.   Radiology    CARDIAC CATHETERIZATION  Result Date: 05/20/2022 Images from the original result were not included.   Prox LAD lesion is 25% stenosed.   There is severe left ventricular  systolic dysfunction.   LV end diastolic pressure is moderately elevated.   The left ventricular ejection fraction is less than 25% by visual estimate. Renee Benitez is a 72 y.o. female  [id number] LOCATION:  FACILITY: [facility] PHYSICIAN: Quay Burow, M.D. 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 Radford Pax, early macular degeneration who is being seen 05/19/2022 for the evaluation of NSTEMI at the request of Dr. Roderic Palau.  Her troponins went up to 1700.  Her 2D echo revealed severe LV dysfunction potentially consistent with "Takotsubo syndrome".  She presents now for right left heart cath to define her anatomy physiology. PROCEDURE DESCRIPTION: The patient was brought to the second floor [facility] cath lab in the postabsorptive state.  She was not premedicated .  Her right wrist and antecubital fossa Were prepped and shaved in usual sterile fashion. Xylocaine 1% was used for local anesthesia. A 6 French sheath was inserted into the right radial artery using standard Seldinger technique. The patient received 4500 units  of heparin intravenously.  5 French right left Judkins diagnostic catheters were used for selective coronary angiography and left ventriculography respectively.  A 5 French balloontipped Swan-Ganz catheter was used to obtain sequential pressures and pulmonary blood samples for the determination of Fick cardiac output.  Isovue dye is used for the entirety of the case (  40 cc contrast total patient).   Retrograde ordered, ventricular and pullback pressures were recorded.  Radial cocktail was administered via the SideArm sheath. HEMODYNAMICS:  1: Right atrial pressure-14/12 2: Right ventricular pressure-50/3 3: Pulmonary artery pressure-46/28, mean 23 4: Pulmonary wedge pressure-A-wave 23, V wave 31, mean 22 5: LVEDP-19 6: Cardiac output-4.3 L/min with an index of 2.16 L/min/m.   Ms. Correia  has essentially clean coronary arteries and severe LV dysfunction with elevated enzymes consistent with "Takotsubo syndrome".  She will need guideline directed optimal medical therapy.  Given her history of embolic stroke she will need to be started back on her Eliquis later today.  The sheath was removed and a TR band was placed on the right wrist to achieve patent hemostasis.  The patient left lab in stable condition.  Dr. Jacques Navy  her attending cardiologist, was notified of these results. Nanetta Batty. MD, [facility] 05/20/2022 8:51 AM    ECHOCARDIOGRAM COMPLETE  Result Date: 05/19/2022    ECHOCARDIOGRAM REPORT   Patient Name:   Renee Benitez Date of Exam: 05/19/2022 Medical Rec #:  [id number]       Height:       66.0 in Accession #:    [id number]      Weight:       194.0 lb Date of Birth:  [birth year]       BSA:          1.974 m Patient Age:    20 years        BP:           138/78 mmHg Patient Gender: F               HR:           74 bpm. [facility]am [name] Procedure: 2D Echo, Cardiac Doppler and Color Doppler Indications:    NSTEMI I21.4  History:        Patient has prior history of Echocardiogram examinations, most                 recent 01/06/2021. Stroke, Arrythmias:Atrial Fibrillation and                 Atrial Flutter; Risk Factors:Hypertension.  Sonographer:    Celesta Gentile RCS Referring Phys: [phone] Renee Benitez IMPRESSIONS  1. Anteroseptal wall is akinetic except very small area at the base. The mid to distal inferoseptum, anterolateral, anterior walls are akinetic. Apex is akinetic. If coronary disease  excluded pattern would fit Takotsubo cardiomyopathy. . Left ventricular ejection fraction, by estimation, is 30 to 35%. The left ventricle has moderately decreased function. The left ventricle demonstrates regional wall motion abnormalities (see scoring diagram/findings for description). There is mild left ventricular hypertrophy. Left ventricular diastolic parameters are consistent with Grade II diastolic dysfunction (pseudonormalization). Elevated left atrial pressure.  2. Right ventricular systolic function is normal. The right ventricular size is normal. There is mildly elevated pulmonary artery systolic pressure.  3. Left atrial size was mildly dilated.  4. The mitral valve is abnormal. Mild mitral valve regurgitation.  5. The tricuspid valve is abnormal.  6. The aortic valve is tricuspid. Aortic valve regurgitation is mild. No aortic stenosis is present.  7. The inferior vena cava is normal in size with greater than 50% respiratory variability, suggesting right atrial pressure of 3 mmHg. FINDINGS  Left Ventricle: Anteroseptal wall is akinetic except very small area at the base. The mid to distal inferoseptum, anterolateral, anterior walls are akinetic. Apex  is akinetic. If coronary disease excluded pattern would fit Takotsubo cardiomyopathy. Left  ventricular ejection fraction, by estimation, is 30 to 35%. The left ventricle has moderately decreased function. The left ventricle demonstrates regional wall motion abnormalities. The left ventricular internal cavity size was normal in size. There is mild left ventricular hypertrophy. Left ventricular diastolic parameters are consistent with Grade II diastolic dysfunction (pseudonormalization). Elevated left atrial pressure. Right Ventricle: The right ventricular size is normal. Right vetricular wall thickness was not well visualized. Right ventricular systolic function is normal. There is mildly elevated pulmonary artery systolic pressure. The tricuspid  regurgitant velocity  is 3.16 m/s, and with an assumed right atrial pressure of 3 mmHg, the estimated right ventricular systolic pressure is 54.6 mmHg. Left Atrium: Left atrial size was mildly dilated. Right Atrium: Right atrial size was normal in size. Pericardium: There is no evidence of pericardial effusion. Mitral Valve: The mitral valve is abnormal. Mild mitral valve regurgitation. Tricuspid Valve: The tricuspid valve is abnormal. Tricuspid valve regurgitation is mild . No evidence of tricuspid stenosis. Aortic Valve: The aortic valve is tricuspid. Aortic valve regurgitation is mild. Aortic regurgitation PHT measures 552 msec. No aortic stenosis is present. Aortic valve mean gradient measures 3.7 mmHg. Aortic valve peak gradient measures 7.3 mmHg. Aortic  valve area, by VTI measures 2.28 cm. Pulmonic Valve: The pulmonic valve was not well visualized. Pulmonic valve regurgitation is not visualized. No evidence of pulmonic stenosis. Aorta: The aortic root is normal in size and structure. Venous: The inferior vena cava is normal in size with greater than 50% respiratory variability, suggesting right atrial pressure of 3 mmHg. IAS/Shunts: No atrial level shunt detected by color flow Doppler.  LEFT VENTRICLE PLAX 2D LVIDd:         4.60 cm      Diastology LVIDs:         3.00 cm      LV e' medial:    5.00 cm/s LV PW:         1.20 cm      LV E/e' medial:  18.7 LV IVS:        1.20 cm      LV e' lateral:   6.64 cm/s LVOT diam:     2.00 cm      LV E/e' lateral: 14.1 LV SV:         62 LV SV Index:   31 LVOT Area:     3.14 cm  LV Volumes (MOD) LV vol d, MOD A2C: 107.0 ml LV vol d, MOD A4C: 86.2 ml LV vol s, MOD A2C: 60.3 ml LV vol s, MOD A4C: 42.6 ml LV SV MOD A2C:     46.7 ml LV SV MOD A4C:     86.2 ml LV SV MOD BP:      50.6 ml RIGHT VENTRICLE RV S prime:     11.10 cm/s TAPSE (M-mode): 2.1 cm LEFT ATRIUM             Index        RIGHT ATRIUM           Index LA diam:        4.30 cm 2.18 cm/m   RA Area:     17.30 cm LA  Vol (A2C):   81.1 ml 41.08 ml/m  RA Volume:   45.40 ml  23.00 ml/m LA Vol (A4C):   71.5 ml 36.22 ml/m LA Biplane Vol: 76.5 ml 38.75 ml/m  AORTIC VALVE AV Area (Vmax):  2.22 cm AV Area (Vmean):   2.32 cm AV Area (VTI):     2.28 cm AV Vmax:           134.64 cm/s AV Vmean:          91.603 cm/s AV VTI:            0.271 m AV Peak Grad:      7.3 mmHg AV Mean Grad:      3.7 mmHg LVOT Vmax:         95.30 cm/s LVOT Vmean:        67.600 cm/s LVOT VTI:          0.197 m LVOT/AV VTI ratio: 0.73 AI PHT:            552 msec  AORTA Ao Root diam: 3.70 cm MITRAL VALVE               TRICUSPID VALVE MV Area (PHT): 3.85 cm    TR Peak grad:   39.9 mmHg MV Decel Time: 197 msec    TR Vmax:        316.00 cm/s MV E velocity: 93.40 cm/s MV A velocity: 44.60 cm/s  SHUNTS MV E/A ratio:  2.09        Systemic VTI:  0.20 m                            Systemic Diam: 2.00 cm Dina Rich MD Electronically signed by Dina Rich MD Signature Date/Time: 05/19/2022/1:16:51 PM    Final    DG Chest Port 1 View  Result Date: 05/19/2022 CLINICAL DATA:  Chest pain. EXAM: PORTABLE CHEST 1 VIEW COMPARISON:  November 07, 2018 FINDINGS: The heart size and mediastinal contours are within normal limits. Diffuse, mild to moderate severity increased interstitial lung markings are noted. There is no evidence of focal consolidation, pleural effusion or pneumothorax. The visualized skeletal structures are unremarkable. IMPRESSION: Findings which may represent mild to moderate severity interstitial edema. Electronically Signed   By: Aram Candela M.D.   On: 05/19/2022 03:05    Cardiac Studies   05/20/22 LHC/RHC    Prox LAD lesion is 25% stenosed.   There is severe left ventricular systolic dysfunction.   LV end diastolic pressure is moderately elevated.   The left ventricular ejection fraction is less than 25% by visual estimate.  HEMODYNAMICS:    1: Right atrial pressure-14/12 2: Right ventricular pressure-50/3 3: Pulmonary artery  pressure-46/28, mean 23 4: Pulmonary wedge pressure-A-wave 23, V wave 31, mean 22 5: LVEDP-19 6: Cardiac output-4.3 L/min with an index of 2.16 L/min/m.  05/19/22 TTE  IMPRESSIONS     1. Anteroseptal wall is akinetic except very small area at the base. The  mid to distal inferoseptum, anterolateral, anterior walls are akinetic.  Apex is akinetic. If coronary disease excluded pattern would fit Takotsubo  cardiomyopathy. . Left  ventricular ejection fraction, by estimation, is 30 to 35%. The left  ventricle has moderately decreased function. The left ventricle  demonstrates regional wall motion abnormalities (see scoring  diagram/findings for description). There is mild left  ventricular hypertrophy. Left ventricular diastolic parameters are  consistent with Grade II diastolic dysfunction (pseudonormalization).  Elevated left atrial pressure.   2. Right ventricular systolic function is normal. The right ventricular  size is normal. There is mildly elevated pulmonary artery systolic  pressure.   3. Left atrial size was mildly dilated.   4. The mitral valve  is abnormal. Mild mitral valve regurgitation.   5. The tricuspid valve is abnormal.   6. The aortic valve is tricuspid. Aortic valve regurgitation is mild. No  aortic stenosis is present.   7. The inferior vena cava is normal in size with greater than 50%  respiratory variability, suggesting right atrial pressure of 3 mmHg.   FINDINGS   Left Ventricle: Anteroseptal wall is akinetic except very small area at  the base. The mid to distal inferoseptum, anterolateral, anterior walls  are akinetic. Apex is akinetic. If coronary disease excluded pattern would  fit Takotsubo cardiomyopathy. Left   ventricular ejection fraction, by estimation, is 30 to 35%. The left  ventricle has moderately decreased function. The left ventricle  demonstrates regional wall motion abnormalities. The left ventricular  internal cavity size was normal in  size. There is  mild left ventricular hypertrophy. Left ventricular diastolic parameters  are consistent with Grade II diastolic dysfunction (pseudonormalization).  Elevated left atrial pressure.   Right Ventricle: The right ventricular size is normal. Right vetricular  wall thickness was not well visualized. Right ventricular systolic  function is normal. There is mildly elevated pulmonary artery systolic  pressure. The tricuspid regurgitant velocity   is 3.16 m/s, and with an assumed right atrial pressure of 3 mmHg, the  estimated right ventricular systolic pressure is 42.9 mmHg.   Left Atrium: Left atrial size was mildly dilated.   Right Atrium: Right atrial size was normal in size.   Pericardium: There is no evidence of pericardial effusion.   Mitral Valve: The mitral valve is abnormal. Mild mitral valve  regurgitation.   Tricuspid Valve: The tricuspid valve is abnormal. Tricuspid valve  regurgitation is mild . No evidence of tricuspid stenosis.   Aortic Valve: The aortic valve is tricuspid. Aortic valve regurgitation is  mild. Aortic regurgitation PHT measures 552 msec. No aortic stenosis is  present. Aortic valve mean gradient measures 3.7 mmHg. Aortic valve peak  gradient measures 7.3 mmHg. Aortic   valve area, by VTI measures 2.28 cm.   Pulmonic Valve: The pulmonic valve was not well visualized. Pulmonic valve  regurgitation is not visualized. No evidence of pulmonic stenosis.   Aorta: The aortic root is normal in size and structure.   Venous: The inferior vena cava is normal in size with greater than 50%  respiratory variability, suggesting right atrial pressure of 3 mmHg.   IAS/Shunts: No atrial level shunt detected by color flow Doppler.   Patient Profile     Renee Benitez is a 72 y.o. 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 Mayford Knife, early macular degeneration who is being seen 05/19/2022 for the evaluation of NSTEMI at the request of Dr. Estell Harpin.   Assessment & Plan    Elevated troponin Stress cardiomyopathy Non-obstructive CAD  Patient admitted with elevated troponin, atypical chest discomfort, concern for ACS. TTE showed new LV dysfunction, EF 30-35%, apical akinesis. Troponin: 989, 1756, 1131. ECG showing newly widened QRS, borderline LBBB. Transferred to [facility] for heart catheterization. LHC today with non-obstructive CAD, LAD lesion is 25% stenosed. Severe LV dysfunction. Pulmonary wedge pressure-A-wave 23, V wave 31, mean 22. Consistent with Takotsubo syndrome.  Will  optimize GDMT: Continue Losartan 25mg . Entresto co-pay [phone]. Start metoprolol tartrate 12.5mg  BID (dose limited by low-normal HR). Start spironolactone 12.5mg  IV lasix 20mg  x1 dose today with abnormal RHC pressures. Will continue to assess need for PO lasix at discharge. Jardiance copay $177, $010 not on insurance formulary. Will reach out about patient assistance before initiating. Continue ASA 81mg  daily Plan for repeat TTE in 4-6 weeks  Paroxysmal atrial fibrillation/flutter s/p ablation Hx CVA  Patient has been doing well post-ablation on Flecainide and Diltiazem. Will need to stop both of these with stress cardiomyopathy/LV dysfunction. HR low-normal today.  Start low dose Metoprolol 12.5mg  BID. Consider consolidation at discharge though patient historically had trouble tolerating long-acting Metoprolol with bradycardia Resume Eliquis this evening after TR band off.  Essential Hypertension  Continue home Losartan 25mg . May need dose adjustment with HF GDMT as above.   Hyperlipidemia  LDL at goal, 55.  Continue home Rosuvastatin  Bladder spasms  Patient recently started on Mybetriq. Held yesterday with widened QRS. ECG today now with narrow/normal QRS. Will review resuming with Dr. .    For questions or updates, please contact [facility] Please consult [email or web address] for contact info under        Signed, Marcelline Deist, PA-C  05/20/2022, 9:54 AM    Patient seen and examined with , PA-C.  Agree as above, with the following exceptions and changes as noted below.  Patient is now status post right and left heart cath with evidence of increased filling pressures but no coronary artery disease.  Echocardiogram regional wall motion abnormalities and systolic dysfunction most consistent with stress cardiomyopathy.  Discussed in detail with the patient and her husband at the bedside, most likely scenario is she became acutely unwell after GI distress.  Notes severe epigastric pain that radiated substernally to her throat. Gen: NAD, CV: RRR, no murmurs, Lungs: clear, Abd: soft, Extrem: Warm, well perfused, no edema, Neuro/Psych: alert and oriented x 3, normal mood and affect. All available labs, radiology testing, previous records reviewed.  She feels well now and has received a dose of Lasix for optimization of filling pressures based on cath results.  Diuresing well.  Medication management more challenging given bradycardia with AV nodal blocking agents and mild hypotension currently.  We will observe her overnight given that she lives in [place] and has a drive home.  If she looks well tomorrow morning can consider early dismissal.  She does not feel that Mybetriq is terribly beneficial for her overactive bladder, would be reasonable to hold while in hospital and she can resume when she gets home.  She does follow with urology.  We can give her as needed Lasix home-going but with her overactive bladder scheduled diuretic may be more challenging.  She may also not need this.   Flecainide and diltiazem have been discontinued.  Will consider consolidating her metoprolol if she does not have significant bradycardia overnight after receiving the medication in hospital today.  Perlie Gold, MD 05/20/22 3:16 PM

## 2022-05-20 NOTE — Interval H&P Note (Signed)
Cath Lab Visit (complete for each Cath Lab visit)  Clinical Evaluation Leading to the Procedure:   ACS: Yes.    Non-ACS:    Anginal Classification: CCS II  Anti-ischemic medical therapy: Minimal Therapy (1 class of medications)  Non-Invasive Test Results: No non-invasive testing performed  Prior CABG: No previous CABG      History and Physical Interval Note:  05/20/2022 7:42 AM  Renee Benitez  has presented today for surgery, with the diagnosis of nstemi.  The various methods of treatment have been discussed with the patient and family. After consideration of risks, benefits and other options for treatment, the patient has consented to  Procedure(s): RIGHT/LEFT HEART CATH AND CORONARY ANGIOGRAPHY (N/A) as a surgical intervention.  The patient's history has been reviewed, patient examined, no change in status, stable for surgery.  I have reviewed the patient's chart and labs.  Questions were answered to the patient's satisfaction.     Quay Burow

## 2022-05-20 NOTE — Progress Notes (Signed)
Pt plans on Dc in am & refused our CPAP tonight

## 2022-05-20 NOTE — Progress Notes (Signed)
Heart Failure Nurse Navigator Progress Note  PCP: Caren Macadam, MD PCP-Cardiologist: Branch Admission Diagnosis: NSTEMI, Hyponatremia Admitted from: Home  Presentation:   Renee Benitez presented with patient eating shrimp and grits developing epigastric pain that radiated up her back , chest and to her throat. She was nauseated and vomited one time. She took a Asprin from home, and came to hospital. BP 146/86, HR 71, CXR with mild to moderate interstitial edema, R/L heart cath on 05/20/22 found to have mild CAD with 25 5 stenosis in prox LAD. Finding most consistent with Takotsubo cardiomyopathy.   Patient, husband, and son were educated on the sign and symptoms of heart failure, daily weights, when to call her doctor or go to the ED. Diet/ fluid restrictions, taking all her medications as prescribed and attending all medical appointments. Patient verbalized her understanding of education, a HF TOC appointment was scheduled for 06/01/2022 @ 2 pm.   ECHO/ LVEF: 30-35% G2DD   Clinical Course:  Past Medical History:  Diagnosis Date   Atrial fibrillation ([place])    Current use of estrogen therapy 01/31/2013   Dysrhythmia    AFib   Hypertension    Macular degeneration    Menopausal syndrome    Overactive bladder    Right ovarian cyst 01/25/2014     Social History   Socioeconomic History   Marital status: Married    Spouse name: Not on file   Number of children: Not on file   Years of education: Not on file   Highest education level: Not on file  Occupational History   Occupation: Nurse    Comment: Personal assistant  Tobacco Use   Smoking status: Never   Smokeless tobacco: Never   Tobacco comments:    Never smoked  Vaping Use   Vaping Use: Never used  Substance and Sexual Activity   Alcohol use: Not Currently    Comment: previous occasional, now none   Drug use: No   Sexual activity: Yes    Birth control/protection: Surgical    Comment: hyst  Other Topics Concern   Not  on file  Social History Narrative   Not on file   Social Determinants of Health   Financial Resource Strain: Low Risk  (07/16/2020)   Overall Financial Resource Strain (CARDIA)    Difficulty of Paying Living Expenses: Not hard at all  Food Insecurity: No Food Insecurity (05/19/2022)   Hunger Vital Sign    Worried About Running Out of Food in the Last Year: Never true    [place] in the Last Year: Never true  Transportation Needs: No Transportation Needs (05/19/2022)   PRAPARE - Hydrologist (Medical): No    Lack of Transportation (Non-Medical): No  Physical Activity: Insufficiently Active (07/16/2020)   Exercise Vital Sign    Days of Exercise per Week: 1 day    Minutes of Exercise per Session: 30 min  Stress: Stress Concern Present (07/16/2020)   [place]    Feeling of Stress : To some extent  Social Connections: Socially Integrated (07/16/2020)   Social Connection and Isolation Panel [NHANES]    Frequency of Communication with Friends and Family: Three times a week    Frequency of Social Gatherings with Friends and Family: Twice a week    Attends Religious Services: More than 4 times per year    Active Member of Clubs or Organizations: Yes    Attends  Music therapist: More than 4 times per year    Marital Status: Married   Teacher, early years/pre and Provision:  Detailed education and instructions provided on heart failure disease management including the following:  Signs and symptoms of Heart Failure When to call the physician Importance of daily weights Low sodium diet Fluid restriction Medication management Anticipated future follow-up appointments  Patient education given on each of the above topics.  Patient acknowledges understanding via teach back method and acceptance of all instructions.  Education Materials:  "Living Better With Heart Failure" Booklet, HF  zone tool, & Daily Weight Tracker Tool.  Patient has scale at home: yes Patient has pill box at home: yes    High Risk Criteria for Readmission and/or Poor Patient Outcomes: Heart failure hospital admissions (last 6 months): 0  No Show rate: 1% Difficult social situation: No Demonstrates medication adherence: Yes Primary Language: English Literacy level: Reading, writing, and comprehension  Barriers of Care:   Diet/ fluid restrictions Daily weights  Considerations/Referrals:   Referral made to Heart Failure Pharmacist Stewardship: yes Referral made to Heart Failure CSW/NCM TOC: No Referral made to Heart & Vascular TOC clinic: Yes, 06/01/2022 @ 2 pm.   Items for Follow-up on DC/TOC: Diet/ fluid restrictions/ daily weights Continued Hf education   Earnestine Leys, BSN, RN Heart Failure Transport planner Only

## 2022-05-21 ENCOUNTER — Other Ambulatory Visit: Payer: Self-pay | Admitting: Cardiology

## 2022-05-21 ENCOUNTER — Encounter (HOSPITAL_COMMUNITY): Payer: Self-pay | Admitting: Cardiovascular Disease

## 2022-05-21 ENCOUNTER — Other Ambulatory Visit (HOSPITAL_COMMUNITY): Payer: Self-pay

## 2022-05-21 DIAGNOSIS — I5181 Takotsubo syndrome: Secondary | ICD-10-CM

## 2022-05-21 LAB — CBC
HCT: 39.5 % (ref 36.0–46.0)
Hemoglobin: 12.7 g/dL (ref 12.0–15.0)
MCH: 30 pg (ref 26.0–34.0)
MCHC: 32.2 g/dL (ref 30.0–36.0)
MCV: 93.2 fL (ref 80.0–100.0)
Platelets: 193 10*3/uL (ref 150–400)
RBC: 4.24 MIL/uL (ref 3.87–5.11)
RDW: 13.2 % (ref 11.5–15.5)
WBC: 10.3 10*3/uL (ref 4.0–10.5)
nRBC: 0 % (ref 0.0–0.2)

## 2022-05-21 LAB — BASIC METABOLIC PANEL
Anion gap: 6 (ref 5–15)
BUN: 19 mg/dL (ref 8–23)
CO2: 27 mmol/L (ref 22–32)
Calcium: 9 mg/dL (ref 8.9–10.3)
Chloride: 103 mmol/L (ref 98–111)
Creatinine, Ser: 0.96 mg/dL (ref 0.44–1.00)
GFR, Estimated: >60 mL/min (ref >60)
Glucose, Bld: 103 mg/dL — ABNORMAL HIGH (ref 70–99)
Potassium: 5.1 mmol/L (ref 3.5–5.1)
Sodium: 136 mmol/L (ref 135–145)

## 2022-05-21 MED ORDER — EMPAGLIFLOZIN 10 MG PO TABS
10.0000 mg | ORAL_TABLET | Freq: Every day | ORAL | 1 refills | Status: AC
  Filled 2022-05-21 – 2022-06-16 (×2): qty 30, 30d supply, fill #0

## 2022-05-21 MED ORDER — METOPROLOL SUCCINATE ER 25 MG PO TB24
25.0000 mg | ORAL_TABLET | Freq: Every day | ORAL | 1 refills | Status: DC
  Filled 2022-05-21: qty 30, 30d supply, fill #0

## 2022-05-21 MED ORDER — METOPROLOL TARTRATE 25 MG PO TABS
12.5000 mg | ORAL_TABLET | ORAL | 0 refills | Status: DC | PRN
  Filled 2022-05-21: qty 30, 60d supply, fill #0

## 2022-05-21 MED ORDER — EMPAGLIFLOZIN 10 MG PO TABS
10.0000 mg | ORAL_TABLET | Freq: Every day | ORAL | Status: DC
  Administered 2022-05-21: 10 mg via ORAL
  Filled 2022-05-21: qty 1

## 2022-05-21 NOTE — Progress Notes (Signed)
Discharge instructions given. Patient verbalized understanding and all questions were answered.  

## 2022-05-21 NOTE — Discharge Summary (Addendum)
Discharge Summary    Patient ID: Renee Benitez MRN: [id number]; DOB: 09-Mar-1951  Admit date: 05/18/2022 Discharge date: 05/21/2022  PCP:  Caren Macadam, [place] Providers Cardiologist:  Carlyle Dolly, MD  Electrophysiologist:  Cristopher Peru, MD       Discharge Diagnoses    Principal Problem:   Non-STEMI (non-ST elevated myocardial infarction) [facility]) Active Problems:   NSTEMI (non-ST elevated myocardial infarction) [facility])    Diagnostic Studies/Procedures    05/19/2022 TTE  IMPRESSIONS     1. Anteroseptal wall is akinetic except very small area at the base. The  mid to distal inferoseptum, anterolateral, anterior walls are akinetic.  Apex is akinetic. If coronary disease excluded pattern would fit Takotsubo  cardiomyopathy. . Left  ventricular ejection fraction, by estimation, is 30 to 35%. The left  ventricle has moderately decreased function. The left ventricle  demonstrates regional wall motion abnormalities (see scoring  diagram/findings for description). There is mild left  ventricular hypertrophy. Left ventricular diastolic parameters are  consistent with Grade II diastolic dysfunction (pseudonormalization).  Elevated left atrial pressure.   2. Right ventricular systolic function is normal. The right ventricular  size is normal. There is mildly elevated pulmonary artery systolic  pressure.   3. Left atrial size was mildly dilated.   4. The mitral valve is abnormal. Mild mitral valve regurgitation.   5. The tricuspid valve is abnormal.   6. The aortic valve is tricuspid. Aortic valve regurgitation is mild. No  aortic stenosis is present.   7. The inferior vena cava is normal in size with greater than 50%  respiratory variability, suggesting right atrial pressure of 3 mmHg.   FINDINGS   Left Ventricle: Anteroseptal wall is akinetic except very small area at  the base. The mid to distal inferoseptum, anterolateral, anterior walls  are  akinetic. Apex is akinetic. If coronary disease excluded pattern would  fit Takotsubo cardiomyopathy. Left   ventricular ejection fraction, by estimation, is 30 to 35%. The left  ventricle has moderately decreased function. The left ventricle  demonstrates regional wall motion abnormalities. The left ventricular  internal cavity size was normal in size. There is  mild left ventricular hypertrophy. Left ventricular diastolic parameters  are consistent with Grade II diastolic dysfunction (pseudonormalization).  Elevated left atrial pressure.   Right Ventricle: The right ventricular size is normal. Right vetricular  wall thickness was not well visualized. Right ventricular systolic  function is normal. There is mildly elevated pulmonary artery systolic  pressure. The tricuspid regurgitant velocity   is 3.16 m/s, and with an assumed right atrial pressure of 3 mmHg, the  estimated right ventricular systolic pressure is 0000000 mmHg.   Left Atrium: Left atrial size was mildly dilated.   Right Atrium: Right atrial size was normal in size.   Pericardium: There is no evidence of pericardial effusion.   Mitral Valve: The mitral valve is abnormal. Mild mitral valve  regurgitation.   Tricuspid Valve: The tricuspid valve is abnormal. Tricuspid valve  regurgitation is mild . No evidence of tricuspid stenosis.   Aortic Valve: The aortic valve is tricuspid. Aortic valve regurgitation is  mild. Aortic regurgitation PHT measures 552 msec. No aortic stenosis is  present. Aortic valve mean gradient measures 3.7 mmHg. Aortic valve peak  gradient measures 7.3 mmHg. Aortic   valve area, by VTI measures 2.28 cm.   Pulmonic Valve: The pulmonic valve was not well visualized. Pulmonic valve  regurgitation is not visualized. No evidence of pulmonic  stenosis.   Aorta: The aortic root is normal in size and structure.   Venous: The inferior vena cava is normal in size with greater than 50%  respiratory  variability, suggesting right atrial pressure of 3 mmHg.   IAS/Shunts: No atrial level shunt detected by color flow Doppler.    05/21/2022 LHC/RHC    Prox LAD lesion is 25% stenosed.   There is severe left ventricular systolic dysfunction.   LV end diastolic pressure is moderately elevated.   The left ventricular ejection fraction is less than 25% by visual estimate.  HEMODYNAMICS:    1: Right atrial pressure-14/12 2: Right ventricular pressure-50/3 3: Pulmonary artery pressure-46/28, mean 23 4: Pulmonary wedge pressure-A-wave 23, V wave 31, mean 22 5: LVEDP-19 6: Cardiac output-4.3 L/min with an index of 2.16 L/min/m. 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 Radford Pax, early macular degeneration who is being seen 05/19/2022 for the evaluation of NSTEMI at the request of Dr. Roderic Palau.    Patient went out to dinner on 2/6, reports the food tasted "off." Later in the evening, she developed burning chest discomfort. Patient initially attributed to indigestion with hx GI issues. She took 2 Tylenol and 2 GasX without relief. The discomfort eventually migrated up to her throat and persisted longer than usual so came to ER where initial troponin was 989. She had some n/v in the ED as well. No recent SOB, diaphoresis, breakthrough afib, syncope.   Hospital Course     Elevated troponin Stress cardiomyopathy Non-obstructive CAD   Patient admitted with elevated troponin, atypical chest discomfort, concern for ACS. TTE showed new LV dysfunction, EF 30-35%, apical  akinesis. Troponin: 989, 1756, 1131. ECG showing newly widened QRS, borderline LBBB. Transferred to [facility] for heart catheterization. LHC with non-obstructive CAD, LAD lesion is 25% stenosed. Severe LV dysfunction. Pulmonary wedge pressure-A-wave 23, V wave 31, mean 22. Consistent with Takotsubo syndrome.   Will optimize GDMT: Continue Losartan 66m. Patient with labile BP, unlikely to tolerate Entresto. Patient will take PRN Metoprolol only at discharge due to bradycardia. No spironolactone due to significant K increase overnight. Euvolemic, no loop diuretic Start Jardiance 176m Continue until EF recovered Continue ASA 8152maily Plan for repeat TTE in 4-6 weeks   Paroxysmal atrial fibrillation/flutter s/p ablation Hx CVA   Patient has been doing well post-ablation on Flecainide and Diltiazem. Will need to stop both of these with stress cardiomyopathy/LV dysfunction. HR remains low-normal today.   Due to bradycardia on chronic beta blocker, will discharge on PRN Metoprolol 12.5mg69mo be taken for elevated HR/palpitations. Continue Eliquis 5mg 40m   Essential Hypertension   Continue home Losartan 25mg.76myperlipidemia   LDL at goal, 55. Continue home Rosuvastatin   Bladder spasms   Patient recently started on Mybetriq. Held yesterday with widened QRS. ECG today now with narrow/normal QRS. ECG now appears back to baseline. Patient can resume at home.       Did the patient have an acute coronary syndrome (MI, NSTEMI, STEMI, etc) this admission?:  No  Did the patient have a percutaneous coronary intervention (stent / angioplasty)?:  No.        The patient has been scheduled for a HF TOC follow up appointment. A message has been sent to the [facility] and Scheduling Pool at the office where the patient should be seen for follow up.  _____________  Discharge Vitals Blood pressure 135/71, pulse 67, temperature 98 F (36.7 C), temperature source Oral,  resp. rate 17, height 5' 6"$  (1.676 m), weight 87.6 kg, SpO2 98 %.  Filed Weights   05/18/22 2221 05/20/22 0441 05/21/22 0532  Weight: 88 kg 88.7 kg 87.6 kg    Physical Exam Vitals reviewed.  Constitutional:      Appearance: Normal appearance.  HENT:     Head: Normocephalic.  Eyes:     Pupils: Pupils are equal, round, and reactive to light.  Neck:     Comments: No JVD Cardiovascular:     Rate and Rhythm: Normal rate and regular rhythm.     Pulses: Normal pulses.     Heart sounds: Normal heart sounds. No murmur heard. Pulmonary:     Effort: Pulmonary effort is normal. No respiratory distress.     Breath sounds: Normal breath sounds.  Abdominal:     General: Abdomen is flat.     Palpations: Abdomen is soft.  Musculoskeletal:        General: No swelling.  Skin:    General: Skin is warm and dry.  Neurological:     General: No focal deficit present.     Mental Status: She is alert and oriented to person, place, and time.  Psychiatric:        Mood and Affect: Mood normal.        Behavior: Behavior normal.        Thought Content: Thought content normal.        Judgment: Judgment normal.      Labs & Radiologic Studies    CBC Recent Labs    05/20/22 0149 05/20/22 0819 05/20/22 0823 05/21/22 0222  WBC 11.8*  --   --  10.3  HGB 12.6   < > 12.2 12.7  HCT 37.3   < > 36.0 39.5  MCV 90.1  --   --  93.2  PLT 190  --   --  193   < > = values in this interval not displayed.   Basic Metabolic Panel Recent Labs    05/19/22 0811 05/20/22 0149 05/20/22 0819 05/20/22 0823 05/21/22 0222  NA  --  136   < > 139 136  K  --  3.6   < > 4.1 5.1  CL  --  101  --   --  103  CO2  --  24  --   --  27  GLUCOSE  --  120*  --   --  103*  BUN  --  23  --   --  19  CREATININE  --  1.13*  --   --  0.96  CALCIUM  --  8.6*  --   --  9.0  MG 2.2 2.1  --   --   --    < > = values in this interval not displayed.   Liver Function Tests Recent Labs    05/18/22 2254  AST 31  ALT 25   ALKPHOS 87  BILITOT 0.9  PROT 7.5  ALBUMIN 4.3   Recent Labs    05/18/22 2254  LIPASE 41  High Sensitivity Troponin:   Recent Labs  Lab 05/19/22 0245 05/19/22 0811 05/20/22 0448  TROPONINIHS 989* 1,756* 1,131*    BNP Invalid input(s): "POCBNP" D-Dimer No results for input(s): "DDIMER" in the last 72 hours. Hemoglobin A1C Recent Labs    05/19/22 0811  HGBA1C 5.4   Fasting Lipid Panel Recent Labs    05/20/22 0149  CHOL 122  HDL 52  LDLCALC 55  TRIG 73  CHOLHDL 2.3   Thyroid Function Tests Recent Labs    05/19/22 0811  TSH 2.971   _____________  CARDIAC CATHETERIZATION  Result Date: 05/20/2022 Images from the original result were not included.   Prox LAD lesion is 25% stenosed.   There is severe left ventricular systolic dysfunction.   LV end diastolic pressure is moderately elevated.   The left ventricular ejection fraction is less than 25% by visual estimate. Renee Benitez is a 72 y.o. female  [id number] LOCATION:  FACILITY: [facility] PHYSICIAN: Quay Burow, M.D. 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 Radford Pax, early macular degeneration who is being seen 05/19/2022 for the evaluation of NSTEMI at the request of Dr. Roderic Palau.  Her troponins went up to 1700.  Her 2D echo revealed severe LV dysfunction potentially consistent with "Takotsubo syndrome".  She presents now for right left heart cath to define her anatomy physiology. PROCEDURE DESCRIPTION: The patient was brought  to the second floor [facility] cath lab in the postabsorptive state.  She was not premedicated .  Her right wrist and antecubital fossa Were prepped and shaved in usual sterile fashion. Xylocaine 1% was used for local anesthesia. A 6 French sheath was inserted into the right radial artery using standard Seldinger technique. The patient received 4500 units  of heparin intravenously.  5 French right left Judkins diagnostic catheters were used for selective coronary angiography and left ventriculography respectively.  A 5 French balloontipped Swan-Ganz catheter was used to obtain sequential pressures and pulmonary blood samples for the determination of Fick cardiac output.  Isovue dye is used for the entirety of the case (40 cc contrast total patient).  Retrograde ordered, ventricular and pullback pressures were recorded.  Radial cocktail was administered via the SideArm sheath. HEMODYNAMICS:  1: Right atrial pressure-14/12 2: Right ventricular pressure-50/3 3: Pulmonary artery pressure-46/28, mean 23 4: Pulmonary wedge pressure-A-wave 23, V wave 31, mean 22 5: LVEDP-19 6: Cardiac output-4.3 L/min with an index of 2.16 L/min/m.   Renee Benitez  has essentially clean coronary arteries and severe LV dysfunction with elevated enzymes consistent with "Takotsubo syndrome".  She will need guideline directed optimal medical therapy.  Given her history of embolic stroke she will need to be started back on her Eliquis later today.  The sheath was removed and a TR band was placed on the right wrist to achieve patent hemostasis.  The patient left lab in stable condition.  Dr. Margaretann Loveless  her attending cardiologist, was notified of these results. Quay Burow. MD, [facility] 05/20/2022 8:51 AM    ECHOCARDIOGRAM COMPLETE  Result Date: 05/19/2022  ECHOCARDIOGRAM REPORT   Patient Name:   Renee Benitez Date of Exam: 05/19/2022 Medical Rec #:  [id number]       Height:       66.0 in Accession #:    [id number]      Weight:        194.0 lb Date of Birth:  05/26/1950       BSA:          1.974 m Patient Age:    61 years        BP:           138/78 mmHg Patient Gender: F               HR:           74 bpm. Exam Location:  [facility] Procedure: 2D Echo, Cardiac Doppler and Color Doppler Indications:    NSTEMI I21.4  History:        Patient has prior history of Echocardiogram examinations, most                 recent 01/06/2021. Stroke, Arrythmias:Atrial Fibrillation and                 Atrial Flutter; Risk Factors:Hypertension.  Sonographer:    Alvino Chapel RCS Referring Phys: [id number] [place]  1. Anteroseptal wall is akinetic except very small area at the base. The mid to distal inferoseptum, anterolateral, anterior walls are akinetic. Apex is akinetic. If coronary disease excluded pattern would fit Takotsubo cardiomyopathy. . Left ventricular ejection fraction, by estimation, is 30 to 35%. The left ventricle has moderately decreased function. The left ventricle demonstrates regional wall motion abnormalities (see scoring diagram/findings for description). There is mild left ventricular hypertrophy. Left ventricular diastolic parameters are consistent with Grade II diastolic dysfunction (pseudonormalization). Elevated left atrial pressure.  2. Right ventricular systolic function is normal. The right ventricular size is normal. There is mildly elevated pulmonary artery systolic pressure.  3. Left atrial size was mildly dilated.  4. The mitral valve is abnormal. Mild mitral valve regurgitation.  5. The tricuspid valve is abnormal.  6. The aortic valve is tricuspid. Aortic valve regurgitation is mild. No aortic stenosis is present.  7. The inferior vena cava is normal in size with greater than 50% respiratory variability, suggesting right atrial pressure of 3 mmHg. FINDINGS  Left Ventricle: Anteroseptal wall is akinetic except very small area at the base. The mid to distal inferoseptum, anterolateral, anterior walls are akinetic. Apex  is akinetic. If coronary disease excluded pattern would fit Takotsubo cardiomyopathy. Left  ventricular ejection fraction, by estimation, is 30 to 35%. The left ventricle has moderately decreased function. The left ventricle demonstrates regional wall motion abnormalities. The left ventricular internal cavity size was normal in size. There is mild left ventricular hypertrophy. Left ventricular diastolic parameters are consistent with Grade II diastolic dysfunction (pseudonormalization). Elevated left atrial pressure. Right Ventricle: The right ventricular size is normal. Right vetricular wall thickness was not well visualized. Right ventricular systolic function is normal. There is mildly elevated pulmonary artery systolic pressure. The tricuspid regurgitant velocity  is 3.16 m/s, and with an assumed right atrial pressure of 3 mmHg, the estimated right ventricular systolic pressure is 0000000 mmHg. Left Atrium: Left atrial size was mildly dilated. Right Atrium: Right atrial size was normal in size. Pericardium: There is no evidence of pericardial effusion. Mitral Valve: The mitral valve is abnormal. Mild mitral valve regurgitation. Tricuspid Valve: The tricuspid valve is  abnormal. Tricuspid valve regurgitation is mild . No evidence of tricuspid stenosis. Aortic Valve: The aortic valve is tricuspid. Aortic valve regurgitation is mild. Aortic regurgitation PHT measures 552 msec. No aortic stenosis is present. Aortic valve mean gradient measures 3.7 mmHg. Aortic valve peak gradient measures 7.3 mmHg. Aortic  valve area, by VTI measures 2.28 cm. Pulmonic Valve: The pulmonic valve was not well visualized. Pulmonic valve regurgitation is not visualized. No evidence of pulmonic stenosis. Aorta: The aortic root is normal in size and structure. Venous: The inferior vena cava is normal in size with greater than 50% respiratory variability, suggesting right atrial pressure of 3 mmHg. IAS/Shunts: No atrial level shunt detected by  color flow Doppler.  LEFT VENTRICLE PLAX 2D LVIDd:         4.60 cm      Diastology LVIDs:         3.00 cm      LV e' medial:    5.00 cm/s LV PW:         1.20 cm      LV E/e' medial:  18.7 LV IVS:        1.20 cm      LV e' lateral:   6.64 cm/s LVOT diam:     2.00 cm      LV E/e' lateral: 14.1 LV SV:         62 LV SV Index:   31 LVOT Area:     3.14 cm  LV Volumes (MOD) LV vol d, MOD A2C: 107.0 ml LV vol d, MOD A4C: 86.2 ml LV vol s, MOD A2C: 60.3 ml LV vol s, MOD A4C: 42.6 ml LV SV MOD A2C:     46.7 ml LV SV MOD A4C:     86.2 ml LV SV MOD BP:      50.6 ml RIGHT VENTRICLE RV S prime:     11.10 cm/s TAPSE (M-mode): 2.1 cm LEFT ATRIUM             Index        RIGHT ATRIUM           Index LA diam:        4.30 cm 2.18 cm/m   RA Area:     17.30 cm LA Vol (A2C):   81.1 ml 41.08 ml/m  RA Volume:   45.40 ml  23.00 ml/m LA Vol (A4C):   71.5 ml 36.22 ml/m LA Biplane Vol: 76.5 ml 38.75 ml/m  AORTIC VALVE AV Area (Vmax):    2.22 cm AV Area (Vmean):   2.32 cm AV Area (VTI):     2.28 cm AV Vmax:           134.64 cm/s AV Vmean:          91.603 cm/s AV VTI:            0.271 m AV Peak Grad:      7.3 mmHg AV Mean Grad:      3.7 mmHg LVOT Vmax:         95.30 cm/s LVOT Vmean:        67.600 cm/s LVOT VTI:          0.197 m LVOT/AV VTI ratio: 0.73 AI PHT:            552 msec  AORTA Ao Root diam: 3.70 cm MITRAL VALVE               TRICUSPID VALVE MV Area (PHT): 3.85 cm  TR Peak grad:   39.9 mmHg MV Decel Time: 197 msec    TR Vmax:        316.00 cm/s MV E velocity: 93.40 cm/s MV A velocity: 44.60 cm/s  SHUNTS MV E/A ratio:  2.09        Systemic VTI:  0.20 m                            Systemic Diam: 2.00 cm Carlyle Dolly MD Electronically signed by Carlyle Dolly MD Signature Date/Time: 05/19/2022/1:16:51 PM    Final    DG Chest Port 1 View  Result Date: 05/19/2022 CLINICAL DATA:  Chest pain. EXAM: PORTABLE CHEST 1 VIEW COMPARISON:  November 07, 2018 FINDINGS: The heart size and mediastinal contours are within normal limits.  Diffuse, mild to moderate severity increased interstitial lung markings are noted. There is no evidence of focal consolidation, pleural effusion or pneumothorax. The visualized skeletal structures are unremarkable. IMPRESSION: Findings which may represent mild to moderate severity interstitial edema. Electronically Signed   By: Virgina Norfolk M.D.   On: 05/19/2022 03:05   DG BONE DENSITY (DXA)  Result Date: 05/07/2022 EXAM: DUAL X-RAY ABSORPTIOMETRY (DXA) FOR BONE MINERAL DENSITY IMPRESSION: Your patient Renee Benitez completed a BMD test on 05/07/2022 using the [employer] (software version: 14.10) manufactured by [employer]. The following summarizes the results of our evaluation. Technologist: [facility] PATIENT BIOGRAPHICAL: Name: Renee Benitez, Renee Benitez Patient ID: [id number] Birth Date: 1950-10-13 Height: 66.0 in. Gender: Female Exam Date: 05/07/2022 Weight: 194.6 lbs. Indications: Caucasian, Follow up Osteopenia, Height Loss, Partial Hysterectomy, Post Menopausal, Unilateral oophrectomy Fractures: Treatments: Asprin, Calcium, Multivitamin, Synthroid, Vitamin D DENSITOMETRY RESULTS: Site      Region      Measured Date Measured Age WHO Classification Young Adult T-score BMD         %Change vs. Previous Significant Change (*) AP Spine L1-L4 (L3) 05/07/2022 69.3 Osteopenia -2.4 0.881 g/cm2 -9.8% Yes AP Spine L1-L4 (L3) 03/21/2013 60.2 Osteopenia -1.6 0.977 g/cm2 - - DualFemur Total Right 05/07/2022 69.3 Osteopenia -1.6 0.812 g/cm2 -4.5% Yes DualFemur Total Right 03/21/2013 60.2 Osteopenia -1.3 0.850 g/cm2 - - DualFemur Total Mean 05/07/2022 69.3 Osteopenia -1.3 0.850 g/cm2 -4.4% Yes DualFemur Total Mean 03/21/2013 60.2 Normal -0.9 0.889 g/cm2 - - ASSESSMENT: BMD as determined from AP Spine L1-L4 (L3) is 0.881 g/cm2 with a T-Score of -2.4. This patient is considered osteopenic according to [facility]) criteria. The scan quality is good. L3 was excluded due to advanced  degenerative changes. Compared with the prior study on 03/21/13, the BMD of the total meann and ap spine show a statistically significant decrease. [facility] Pharmac[facility]) criteria for post-menopausal, Caucasian Women: Normal:       T-score at or above -1 SD Osteopenia:   T-score between -1 and -2.5 SD Osteoporosis: T-score at or below -2.5 SD RECOMMENDATIONS: 1. All patients should optimize calcium and vitamin D intake. 2. Consider FDA-approved medical therapies in postmenopausal women and med aged 60 years and older, based on the following: a. A hip or vertebral (clinical or morphometric) fracture b. T-score < -2.5 at the femoral neck or spine after appropriate evaluation to exclude secondary causes c. Low bone mass (T-score between -1.0 and -2.5 at the femoral neck or spine) and a 10-year probability of a hip fracture > 3% or a 10-year probability of a major osteoporosis-related fracture > 20% based on the [place]-adapted WHO algorithm  d. Clinician judgment and/or patient preferences may indicate treatment for people with 10-year fracture probabilities above or below these levels FOLLOW-UP: Patients with diagnosis of osteoporosis or at high risk fracture should have regular bone mineral density tests. For patients eligible for Medicare, routine testing is allowed once every 2 years. Testing frequency can be increased to on year for patients who have rapidly progressing disease, those who are receiving medical therapy to restore bone mass, or have additional risk factors. I have reviewed this report, and agree with the above findings. Mark A. Thornton Papas, M.D. [facility] Your patient Renee Benitez completed a FRAX assessment on 05/07/2022 using the [place] (analysis version: 14.10) manufactured by [employer]. The following summarizes the results of our evaluation. PATIENT BIOGRAPHICAL: Name: Renee Benitez, Renee Benitez Patient ID: [id number] Birth Date: 1950/11/23 Height:    66.0 in. Gender:      [age]    [age]       Weight:    194.6 lbs. Ethnicity:  White                            Exam Date: 05/07/2022 FRAX* RESULTS:  (version: 3.5) 10-year Probability of Fracture1 Major Osteoporotic Fracture2 Hip Fracture 9.7% 1.4% Population: [place] (Caucasian) Risk Factors: None Based on Femur (Right) Neck BMD 1 -The 10-year probability of fracture may be lower than reported if the patient has received treatment. 2 -Major Osteoporotic Fracture: Clinical Spine, Forearm, Hip or Shoulder *FRAX is a Materials engineer of the [employer] for Metabolic Bone Disease, a [place] (WHO) [facility]. ASSESSMENT: The probability of a major osteoporotic fracture is 9.7% within the next ten years. The probability of a hip fracture is 1.4% within the next ten years. Electronically Signed   By: Lavonia Dana M.D.   On: 05/07/2022 11:21   Disposition   Pt is being discharged home today in good condition.  Follow-up Plans & Appointments     Follow-up Information     [facility]. Go in 11 day(s).   Specialty: Cardiology Why: Hospital follow up 2/202/2024 @ 2 pm Please bring a current medication list to appointment FREE valet parking, Entrance C, off [employer] information: [address] [id number] [place] [phone]               Discharge Instructions     AMB referral to Phase II Cardiac Rehabilitation   Complete by: As directed    Diagnosis: NSTEMI   After initial evaluation and assessments completed: Virtual Based Care may be provided alone or in conjunction with Phase 2 Cardiac Rehab based on patient barriers.: Yes   Intensive Cardiac Rehabilitation (ICR) [place] location only OR Traditional Cardiac Rehabilitation (TCR) *If criteria for ICR are not met will enroll in TCR [facility] only): Yes        Discharge Medications   Allergies as of  05/21/2022   No Known Allergies      Medication List     STOP taking these medications    diltiazem 180 MG 24 hr capsule Commonly known as: CARDIZEM CD   diltiazem 60 MG tablet Commonly known as: CARDIZEM   flecainide 100 MG tablet Commonly known as: TAMBOCOR       TAKE these medications    acetaminophen 500 MG tablet Commonly known as: TYLENOL Take 500-1,000  mg by mouth every 6 (six) hours as needed for moderate pain or headache.   Aspirin Low Dose 81 MG tablet Generic drug: aspirin EC Take 1 tablet (81 mg total) by mouth daily.   CALCIUM 600 + D PO Take 1 tablet by mouth every evening.   Dialyvite Vitamin D 5000 125 MCG (5000 UT) capsule Generic drug: Cholecalciferol Take 5,000 Units by mouth every evening.   Eliquis 5 MG Tabs tablet Generic drug: apixaban TAKE ONE TABLET (5MG TOTAL) BY MOUTH TWOTIMES DAILY What changed: See the new instructions.   empagliflozin 10 MG Tabs tablet Commonly known as: JARDIANCE Take 1 tablet (10 mg total) by mouth daily. Start taking on: May 22, 2022   escitalopram 20 MG tablet Commonly known as: LEXAPRO Take 20 mg by mouth daily.   Fish Oil Ultra 1400 MG Caps Take 1,400 mg by mouth in the morning.   levothyroxine 100 MCG tablet Commonly known as: SYNTHROID Take 100 mcg by mouth every morning.   loratadine 10 MG tablet Commonly known as: CLARITIN Take 10 mg by mouth daily as needed for allergies.   losartan 25 MG tablet Commonly known as: COZAAR Take 25 mg by mouth every evening.   Magnesium 250 MG Tabs Take 250 mg by mouth in the morning and at bedtime.   metoprolol tartrate 25 MG tablet Commonly known as: LOPRESSOR Take 0.5 tablets (12.5 mg total) by mouth as needed for up to 30 doses. Take 1/2 tablet by mouth once as needed for elevated HR and palpitations.   multivitamin with minerals tablet Take 1 tablet by mouth in the morning. Women's One A Day   PreserVision AREDS 2 Caps Take 1 capsule by mouth  2 (two) times daily.   pantoprazole 40 MG tablet Commonly known as: PROTONIX TAKE ONE TABLET (40MG TOTAL) BY MOUTH DAILY What changed:  how much to take how to take this when to take this additional instructions   rosuvastatin 20 MG tablet Commonly known as: CRESTOR Take 20 mg by mouth in the morning.           Outstanding Labs/Studies     Duration of Discharge Encounter   Greater than 30 minutes including physician time.  Delton Coombes, PA-C 05/21/2022, 10:00 AM   Patient seen and examined with Lily Kocher PA-C.  Agree as above, with the following exceptions and changes as noted below. Feels well without CP or SOB. Gen: NAD, CV: RRR, no murmurs, Lungs: clear, Abd: soft, Extrem: Warm, well perfused, no edema, Neuro/Psych: alert and oriented x 3, normal mood and affect. All available labs, radiology testing, previous records reviewed. OK to DC home today with close follow up. Bradycardic and likely cannot take scheduled BB, will provide PRN.   Elouise Munroe, MD 05/21/22 11:34 AM

## 2022-05-21 NOTE — Care Management Important Message (Signed)
Important Message  Patient Details  Name: Renee Benitez MRN: [id number] Date of Birth: Feb 09, 1951   Medicare Important Message Given:  Yes     Shelda Altes 05/21/2022, 9:00 AM

## 2022-05-21 NOTE — Progress Notes (Addendum)
   Heart Failure Stewardship Pharmacist Progress Note   PCP: Caren Macadam, MD PCP-Cardiologist: Carlyle Dolly, MD    HPI:  72 yo F with PMH of HTN, afib with prior ablation in 2022, CVA, OSA, HLD, and T2DM.  Presented to the ED on 2/6 with epigastric pain. CXR with mild to moderate interstitial edema. ECHO 2/7 showed LVEF newly reduced to 30-35% (was >75% in 2022), anteroseptal wall akinesis, regional wall motion abnormalities, mild LVH, G2DD, RV normal. Taken for [facility] on 2/8 and found to have mild CAD with 25% stenosis in prox LAD. RA 11, PA 31, wedge 22, CO 4.2, CI 2.2. Finding most consistent with Takotsubo cardiomyopathy.   Discharge HF Medications: Beta Blocker: metoprolol tartrate 12.5 mg daily PRN for elevated HR or palpitations ACE/ARB/ARNI: losartan 25 mg daily SGLT2i: Jardiance 10 mg daily  Prior to admission HF Medications: ACE/ARB/ARNI: losartan 25 mg daily  Pertinent Lab Values: Serum creatinine 0.96, BUN 19, Potassium 5.1, Sodium 136, BNP 921.5, Magnesium 2.1, A1c 5.4   Vital Signs: Weight: 193 lbs (admission weight: 195 lbs) Blood pressure: 130/70s  Heart rate: 60-70s  I/O: incomplete  Medication Assistance / Insurance Benefits Check: Does the patient have prescription insurance?  Yes Type of insurance plan: [employer] (has Medicare A&B but commercial part D plan)  Outpatient Pharmacy:  Prior to admission outpatient pharmacy: [facility] Is the patient willing to use [facility] at discharge? Yes Is the patient willing to transition their outpatient pharmacy to utilize a [facility] outpatient pharmacy?   Pending    Assessment: 1. Acute systolic CHF (LVEF 03-50%), due to NICM, Takotsubo cardiomyopathy. NYHA class II symptoms. - Had bradycardia on chronic BB - discharge with metoprolol tartrate 12.5 mg daily PRN for elevated HR or palpitations - Continue losartan 25 mg daily - K 5.1 today after one dose of spironolactone 12.5 mg yesterday,  consider adding spiro as outpatient pending repeat labs - Agree with adding Jardiance 10 mg daily   Plan: 1) Medication changes recommended at this time: - Agree with changes, discharge today  2) Patient assistance: - Jardiance copay $177 - can use copay card, lowers to $10 per month - Farxiga not on insurance formulary - Entresto copay [phone] - can use copay card, lowers to $10 per fill  3)  Education  - Patient has been educated on current HF medications and potential additions to HF medication regimen - Patient verbalizes understanding that over the next few months, these medication doses may change and more medications may be added to optimize HF regimen - Patient has been educated on basic disease state pathophysiology and goals of therapy   Kerby Nora, PharmD, BCPS Heart Failure Stewardship Pharmacist Phone [phone]

## 2022-05-22 LAB — LIPOPROTEIN A (LPA): Lipoprotein (a): 200.7 nmol/L — ABNORMAL HIGH (ref <75.0)

## 2022-05-26 ENCOUNTER — Ambulatory Visit: Payer: Medicare Other

## 2022-05-31 ENCOUNTER — Telehealth (HOSPITAL_COMMUNITY): Payer: Self-pay

## 2022-05-31 NOTE — Telephone Encounter (Signed)
Left message to confirm appointment for 220/24

## 2022-06-01 ENCOUNTER — Encounter (HOSPITAL_COMMUNITY): Payer: Self-pay

## 2022-06-01 ENCOUNTER — Ambulatory Visit (HOSPITAL_COMMUNITY)
Admit: 2022-06-01 | Discharge: 2022-06-01 | Disposition: A | Discharge status: Home / Self Care | Payer: Medicare Other | Attending: Cardiology | Admitting: Cardiology

## 2022-06-01 VITALS — BP 130/88 | HR 96 | Wt 190.4 lb

## 2022-06-01 DIAGNOSIS — I110 Hypertensive heart disease with heart failure: Secondary | ICD-10-CM | POA: Diagnosis not present

## 2022-06-01 DIAGNOSIS — Z7984 Long term (current) use of oral hypoglycemic drugs: Secondary | ICD-10-CM | POA: Diagnosis not present

## 2022-06-01 DIAGNOSIS — Z7982 Long term (current) use of aspirin: Secondary | ICD-10-CM | POA: Insufficient documentation

## 2022-06-01 DIAGNOSIS — I4819 Other persistent atrial fibrillation: Secondary | ICD-10-CM | POA: Diagnosis not present

## 2022-06-01 DIAGNOSIS — Z8673 Personal history of transient ischemic attack (TIA), and cerebral infarction without residual deficits: Secondary | ICD-10-CM | POA: Diagnosis not present

## 2022-06-01 DIAGNOSIS — I5022 Chronic systolic (congestive) heart failure: Secondary | ICD-10-CM | POA: Diagnosis not present

## 2022-06-01 DIAGNOSIS — I428 Other cardiomyopathies: Secondary | ICD-10-CM | POA: Insufficient documentation

## 2022-06-01 DIAGNOSIS — Z79899 Other long term (current) drug therapy: Secondary | ICD-10-CM | POA: Diagnosis not present

## 2022-06-01 DIAGNOSIS — G4733 Obstructive sleep apnea (adult) (pediatric): Secondary | ICD-10-CM | POA: Diagnosis not present

## 2022-06-01 DIAGNOSIS — E785 Hyperlipidemia, unspecified: Secondary | ICD-10-CM | POA: Diagnosis not present

## 2022-06-01 DIAGNOSIS — Z7901 Long term (current) use of anticoagulants: Secondary | ICD-10-CM | POA: Insufficient documentation

## 2022-06-01 DIAGNOSIS — I447 Left bundle-branch block, unspecified: Secondary | ICD-10-CM | POA: Insufficient documentation

## 2022-06-01 DIAGNOSIS — I2510 Atherosclerotic heart disease of native coronary artery without angina pectoris: Secondary | ICD-10-CM | POA: Insufficient documentation

## 2022-06-01 LAB — BRAIN NATRIURETIC PEPTIDE: B Natriuretic Peptide: 829.1 pg/mL — ABNORMAL HIGH (ref 0.0–100.0)

## 2022-06-01 LAB — BASIC METABOLIC PANEL
Anion gap: 11 (ref 5–15)
BUN: 23 mg/dL (ref 8–23)
CO2: 24 mmol/L (ref 22–32)
Calcium: 9.7 mg/dL (ref 8.9–10.3)
Chloride: 104 mmol/L (ref 98–111)
Creatinine, Ser: 0.92 mg/dL (ref 0.44–1.00)
GFR, Estimated: >60 mL/min (ref >60)
Glucose, Bld: 88 mg/dL (ref 70–99)
Potassium: 4.2 mmol/L (ref 3.5–5.1)
Sodium: 139 mmol/L (ref 135–145)

## 2022-06-01 MED ORDER — METOPROLOL SUCCINATE ER 50 MG PO TB24: 50.0000 mg | ORAL_TABLET | Freq: Every day | ORAL | 2 refills | Status: DC

## 2022-06-01 MED ORDER — ENTRESTO 24-26 MG PO TABS: 1.0000 | ORAL_TABLET | Freq: Two times a day (BID) | ORAL | 11 refills | Status: DC

## 2022-06-01 NOTE — Patient Instructions (Signed)
Labs done today. We will contact you only if your labs are abnormal.  STOP taking Losartan  STOP taking Metoprolol (Lopressor)   START Entresto 24-99m (1 tablet) by mouth 2 times daily.  START Metoprolol XL (Toprol XL) 563m(1 tablet) by mouth daily. (You can increase to 7585m1 & 1/2 tablets) if needed in next 2 days)  No other medication changes were made. Please continue all current medications as prescribed.  Your physician recommends that you schedule a follow-up appointment in: 4 weeks  If you have any questions or concerns before your next appointment please send us Koreamessage through mycOnley call our office at [phone]  TO LEAVE A MESSAGE FOR THE NURSE SELECT OPTION 2, PLEASE LEAVE A MESSAGE INCLUDING: YOUR NAME DATE OF BIRTH CALL BACK NUMBER REASON FOR CALL**this is important as we prioritize the call backs  YOU WILL RECEIVE A CALL BACK THE SAME DAY AS LONG AS YOU CALL BEFORE 4:00 PM   Do the following things EVERYDAY: Weigh yourself in the morning before breakfast. Write it down and keep it in a log. Take your medicines as prescribed Eat low salt foods--Limit salt (sodium) to 2000 mg per day.  Stay as active as you can everyday Limit all fluids for the day to less than 2 liters   At the [facility] and your health needs are our priority. As part of our continuing mission to provide you with exceptional heart care, we have created designated Provider Care Teams. These Care Teams include your primary Cardiologist (physician) and Advanced Practice Providers (APPs- Physician Assistants and Nurse Practitioners) who all work together to provide you with the care you need, when you need it.   You may see any of the following providers on your designated Care Team at your next follow up: Dr DanGlori Bickers DalHaynes KernsP BriLyda JesterA UtahuAudry RilesharmD   Please be sure to bring in all your medications bottles to  every appointment.

## 2022-06-01 NOTE — Progress Notes (Signed)
HEART & VASCULAR TRANSITION OF CARE CONSULT NOTE     Referring Physician: Dr. Margaretann Loveless Primary Care: Caren Macadam, MD Primary Cardiologist:Branch, Roderic Palau, MD   HPI: Referred to clinic by Dr. Margaretann Loveless, cardiology, for heart failure consultation.   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  Patient went out to dinner on 2/6, reports the food tasted "off." Later in the evening, she developed burning chest discomfort. Patient initially attributed to indigestion with hx GI issues. She took 2 Tylenol and 2 GasX without relief. The discomfort eventually migrated up to her throat and persisted longer than usual so came to ER where initial troponin was 989. Also w/ n/v in the ED as well. Concern for ACS. 2D echo showed new LV dysfunction, EF 30-35%, apical akinesis. Troponin: 989, 1756, 1131. EKG showed newly widened QRS and borderline LBBB. Underwent R/LHC showing non-obstructive single vessel CAD w/ 25% prox LAD stenosis, severe LV dysfunction, mildly elevated R+ L filling pressures and marginal cardiac output (mRA 13, mPCW 22, LVEDP 19, Cardiac output-4.3 L/min with an index of 2.16 L/min/m ).  Her presentation was felt most c/w Takotsubo syndrome w/ stress event being acute GI illness. She was diuresed w/ IV Lasix and placed on GDMT. Given LV dysfunction, flecainide and Cardizem were both discontinued. Placed on low dose ? blocker. BP was also noted to be too soft for Entresto. Tolerated losartan. She was referred to [facility] clinic for post hospital f/u. D/c wt 195 lb.   Presents today for assessment. Feels like she is back in Afib. Reports symptoms of persistent palpitations since stopping  flecainide. EKG today shows Afib, 98 bpm. Rates at home variable between 80-110s. No bradycardia. She reports NYHA Class II symptoms. Wt is stable post d/c, down 5 lb at 190 lb. No LEE. BP 130/88 in clinic today and normotensive at home.   She is scheduled for repeat echo at [facility] 3/8.    Cardiac Testing  2D Echo 05/19/22  1. Anteroseptal wall is akinetic except very small area at the base. The  mid to distal inferoseptum, anterolateral, anterior walls are akinetic.  Apex is akinetic. If coronary disease excluded pattern would fit Takotsubo  cardiomyopathy. . Left  ventricular ejection fraction, by estimation, is 30 to 35%. The left  ventricle has moderately decreased function. The left ventricle  demonstrates regional wall motion abnormalities (see scoring  diagram/findings for description). There is mild left  ventricular hypertrophy. Left ventricular diastolic parameters are  consistent with Grade II diastolic dysfunction (pseudonormalization).  Elevated left atrial pressure.   2. Right ventricular systolic function is normal. The right ventricular  size is normal. There is mildly elevated pulmonary artery systolic  pressure.   3. Left atrial size was mildly dilated.   4. The mitral valve is abnormal. Mild mitral valve regurgitation.   5. The tricuspid valve is abnormal.   6. The aortic valve is tricuspid. Aortic valve regurgitation is mild. No  aortic stenosis is present.   7. The inferior vena cava is normal in size with greater than 50%  respiratory variability, suggesting right atrial pressure of 3 mmHg.    LHC/RHC 05/21/22     Prox LAD lesion is 25% stenosed.   There is  severe left ventricular systolic dysfunction.   LV end diastolic pressure is moderately elevated.   The left ventricular ejection fraction is less than 25% by visual estimate.   HEMODYNAMICS:    1: Right atrial pressure-14/12 2: Right ventricular pressure-50/3 3: Pulmonary artery pressure-46/28, mean  23 4: Pulmonary wedge pressure-A-wave 23, V wave 31, mean 22 5: LVEDP-19 6: Cardiac output-4.3 L/min with an index of 2.16 L/min/m.     Review of Systems: [y] = yes, [ ]$  = no   General: Weight gain [ ]$ ; Weight loss [ ]$ ; Anorexia [ ]$ ; Fatigue [ ]$ ; Fever [ ]$ ; Chills [ ]$ ; Weakness [ ]$   Cardiac: Chest pain/pressure [ ]$ ; Resting SOB [ ]$ ; Exertional SOB [Y ]; Orthopnea [ ]$ ; Pedal Edema [ ]$ ; Palpitations [Y ]; Syncope [ ]$ ; Presyncope [ ]$ ; Paroxysmal nocturnal dyspnea[ ]$   Pulmonary: Cough [ ]$ ; Wheezing[ ]$ ; Hemoptysis[ ]$ ; Sputum [ ]$ ; Snoring [ ]$   GI: Vomiting[ ]$ ; Dysphagia[ ]$ ; Melena[ ]$ ; Hematochezia [ ]$ ; Heartburn[ ]$ ; Abdominal pain [ ]$ ; Constipation [ ]$ ; Diarrhea [ ]$ ; BRBPR [ ]$   GU: Hematuria[ ]$ ; Dysuria [ ]$ ; Nocturia[ ]$   Vascular: Pain in legs with walking [ ]$ ; Pain in feet with lying flat [ ]$ ; Non-healing sores [ ]$ ; Stroke [ ]$ ; TIA [ ]$ ; Slurred speech [ ]$ ;  Neuro: Headaches[ ]$ ; Vertigo[ ]$ ; Seizures[ ]$ ; Paresthesias[ ]$ ;Blurred vision [ ]$ ; Diplopia [ ]$ ; Vision changes [ ]$   Ortho/Skin: Arthritis [ ]$ ; Joint pain [ ]$ ; Muscle pain [ ]$ ; Joint swelling [ ]$ ; Back Pain [ ]$ ; Rash [ ]$   Psych: Depression[ ]$ ; Anxiety[ ]$   Heme: Bleeding problems [ ]$ ; Clotting disorders [ ]$ ; Anemia [ ]$   Endocrine: Diabetes [ ]$ ; Thyroid dysfunction[ ]$    Past Medical History:  Diagnosis Date   Atrial fibrillation (HCC)    Current use of estrogen therapy 01/31/2013   Dysrhythmia    AFib   Hypertension    Macular degeneration    Menopausal syndrome    Overactive bladder    Right ovarian cyst 01/25/2014    Current Outpatient Medications  Medication Sig Dispense Refill   acetaminophen (TYLENOL) 500 MG tablet Take 500-1,000 mg by mouth every 6 (six) hours as needed for moderate pain or headache.     aspirin 81 MG EC tablet Take 1 tablet (81 mg total) by mouth daily. 150 tablet 2   Calcium Carb-Cholecalciferol (CALCIUM 600 + D PO) Take 1 tablet by mouth every evening.     Cholecalciferol (DIALYVITE VITAMIN D 5000) 125  MCG (5000 UT) capsule Take 5,000 Units by mouth every evening.     ELIQUIS 5 MG TABS tablet TAKE ONE TABLET (5MG TOTAL) BY MOUTH TWOTIMES DAILY (Patient taking differently: Take 5 mg by mouth 2 (two) times daily.) 180 tablet 3   empagliflozin (JARDIANCE) 10 MG TABS tablet Take 1 tablet (10 mg total) by mouth daily. 30 tablet 1   escitalopram (LEXAPRO) 20 MG tablet Take 20 mg by mouth daily.     levothyroxine (SYNTHROID) 100 MCG tablet Take 100 mcg by mouth every morning.     loratadine (CLARITIN) 10 MG tablet Take 10 mg by mouth daily as needed for allergies.     Magnesium 250 MG TABS Take 250 mg by mouth in the morning and at bedtime. Takes magnesium citrate     metoprolol succinate (TOPROL XL) 50 MG 24 hr tablet Take 1 tablet (50 mg total) by mouth daily. 30 tablet 2   Mirabegron (MYRBETRIQ PO) Take 10 mg  by mouth daily with lunch.     Multiple Vitamins-Minerals (MULTIVITAMIN WITH MINERALS) tablet Take 1 tablet by mouth in the morning. Women's One A Day     Multiple Vitamins-Minerals (PRESERVISION AREDS 2) CAPS Take 1 capsule by mouth 2 (two) times daily.     Omega-3 Fatty Acids (FISH OIL ULTRA) 1400 MG CAPS Take 1,400 mg by mouth in the morning.     pantoprazole (PROTONIX) 40 MG tablet TAKE ONE TABLET (40MG TOTAL) BY MOUTH DAILY (Patient taking differently: Take 40 mg by mouth daily.) 90 tablet 3   rosuvastatin (CRESTOR) 20 MG tablet Take 20 mg by mouth in the morning.     sacubitril-valsartan (ENTRESTO) 24-26 MG Take 1 tablet by mouth 2 (two) times daily. 60 tablet 11   No current facility-administered medications for this encounter.    No Known Allergies    Social History   Socioeconomic History   Marital status: Married    Spouse name: Not on file   Number of children: Not on file   Years of education: Not on file   Highest education level: Not on file  Occupational History   Occupation: Nurse    Comment: [employer]  Tobacco Use   Smoking status: Never   Smokeless  tobacco: Never   Tobacco comments:    Never smoked  Vaping Use   Vaping Use: Never used  Substance and Sexual Activity   Alcohol use: Not Currently    Comment: previous occasional, now none   Drug use: No   Sexual activity: Yes    Birth control/protection: Surgical    Comment: hyst  Other Topics Concern   Not on file  Social History Narrative   Not on file   Social Determinants of Health   Financial Resource Strain: Low Risk  (05/20/2022)   Overall Financial Resource Strain (CARDIA)    Difficulty of Paying Living Expenses: Not very hard  Food Insecurity: No Food Insecurity (05/19/2022)   Hunger Vital Sign    Worried About Running Out of Food in the Last Year: Never true    Ran Out of Food in the Last Year: Never true  Transportation Needs: No Transportation Needs (05/19/2022)   PRAPARE - Hydrologist (Medical): No    Lack of Transportation (Non-Medical): No  Physical Activity: Insufficiently Active (07/16/2020)   Exercise Vital Sign    Days of Exercise per Week: 1 day    Minutes of Exercise per Session: 30 min  Stress: Stress Concern Present (07/16/2020)   [place]    Feeling of Stress : To some extent  Social Connections: Socially Integrated (07/16/2020)   Social Connection and Isolation Panel [NHANES]    Frequency of Communication with Friends and Family: Three times a week    Frequency of Social Gatherings with Friends and Family: Twice a week    Attends Religious Services: More than 4 times per year    Active Member of [employer] or Organizations: Yes    Attends Music therapist: More than 4 times per year    Marital Status: Married  Human resources officer Violence: Not At Risk (05/19/2022)   Humiliation, Afraid, Rape, and Kick questionnaire    Fear of Current or Ex-Partner: No    Emotionally Abused: No    Physically Abused: No    Sexually Abused: No      Family History   Problem Relation Age of Onset   Coronary artery  disease Father    Cancer Father        bladder cancer   Arrhythmia Sister        Reportedly had ablation of SVT   Hypertension Sister    Colon cancer Sister 44       s/p surgical resection, no chemo/radiation   Colon cancer Maternal Grandmother    Hypertension Son     Vitals:   06/01/22 1408  BP: 130/88  Pulse: 96  SpO2: 98%  Weight: 86.4 kg (190 lb 6.4 oz)    PHYSICAL EXAM: General:  Well appearing. No respiratory difficulty HEENT: normal Neck: supple. no JVD. Carotids 2+ bilat; no bruits. No lymphadenopathy or thryomegaly appreciated. Cor: PMI nondisplaced. Irregularly irregular rhythm and rate. No rubs, gallops or murmurs. Lungs: clear Abdomen: soft, nontender, nondistended. No hepatosplenomegaly. No bruits or masses. Good bowel sounds. Extremities: no cyanosis, clubbing, rash, edema Neuro: alert & oriented x 3, cranial nerves grossly intact. moves all 4 extremities w/o difficulty. Affect pleasant.  ECG: Atrial Fibrillation 98 bpm    ASSESSMENT & PLAN:  1. Chronic Systolic Heart Failure - newly diagnosed. NICM. Felt most likely stressed induced CM w/ acute GI illness being likely stress event - Echo EF 30-35%, apical akinesis. Troponin: 989, 1756, 1131. EKG showed newly widened QRS and borderline LBBB. Underwent R/LHC showing non-obstructive single vessel CAD w/ 25% prox LAD stenosis, severe LV dysfunction, mildly elevated R+ L filling pressures and marginal cardiac output (mRA 13, mPCW 22, LVEDP 19, Cardiac output-4.3 L/min with an index of 2.16 L/min/m ). - NYHA Class II. Evuolemic on exam  - Continue Jardiance 10 mg daily  - Stop Losartan - Start Entresto 24-26 mg bid - Stop Lopressor - Start Toprol XL 25 mg daily  - Consider spiro next if K allows  - does not need loop diuretic currently  - scheduled for repeat echo 3/8. If EF unimproved, would recommend referral to the [facility] for further f/u and additional testing  w/ cMRI  - Check BMP/BNP today    2. Persistent Atrial Fibrillation Prior h/o Afib/ flutter, s/p prior ablation. Had been maintained w/ flecainide and Cardizem but both recently discontinued given new LV dysfunction. Now back in Afib, persistent over the last several days. V-rates 80s-110s at home. Mild symptoms.  - we discussed amiodarone but she would like to avoid given concern for potential side effects - will up-titrate ? blocker. Stop Lopressor and switch to Toprol XL 50 mg daily given HF. If rates remain elevated > 100 at home and able to continue to tolerate w/o bradycardia, can further increase dose to 1/12 tablets (75 mg in the next 2 days) - if Afib persists, can consider outpatient DCCV vs referral back to EP for consideration of Tikosyn vs attempt at repeat ablation. She will f/u w/ general cardiology in 3 weeks in the interim   3. CAD LHC 2/24 w/ only mild 1V CAD, 25% pLAD - ASA 81 mg daily  - Crestor 20 mg daily     Referred to HFSW (PCP, Medications, Transportation, ETOH Abuse, Drug Abuse, Insurance, Museum/gallery curator ): No  Refer to Pharmacy:  No Refer to Home Health:  No Refer to Advanced Heart Failure Clinic: Pending f/u echo  Refer to General Cardiology: Yes (followed by Dr. Harl Bowie)  Follow up  in 1 month. If EF normalized on repeat echo 3/8, can continue further care w/ cardiology. If EF remains severely reduced, consider referral to the [facility]. Of note, she has f/u w/ cardiology in  [place] on 3/13. If EF normalized, can cancel TOC clinic visit.

## 2022-06-08 ENCOUNTER — Encounter: Payer: Self-pay | Admitting: Gastroenterology

## 2022-06-08 ENCOUNTER — Ambulatory Visit: Payer: Medicare Other | Admitting: Gastroenterology

## 2022-06-08 VITALS — BP 112/74 | HR 97 | Temp 97.4°F | Ht 66.0 in | Wt 192.6 lb

## 2022-06-08 DIAGNOSIS — K219 Gastro-esophageal reflux disease without esophagitis: Secondary | ICD-10-CM

## 2022-06-08 MED ORDER — PANTOPRAZOLE SODIUM 40 MG PO TBEC: DELAYED_RELEASE_TABLET | ORAL | 3 refills | Status: DC

## 2022-06-08 NOTE — Patient Instructions (Signed)
Continue pantoprazole once daily, 30 minutes before breakfast.  You can add famotidine (Pepcid) as needed in afternoons. Please message in 2-4 weeks if no improvement with this.  We will see you in 6 months!  I enjoyed seeing you again today! At our first visit, I mentioned how I value our relationship and want to provide genuine, compassionate, and quality care. You may receive a survey regarding your visit with me, and I welcome your feedback! Thanks so much for taking the time to complete this. I look forward to seeing you again.   Annitta Needs, PhD, ANP-BC [facility]

## 2022-06-08 NOTE — Progress Notes (Signed)
Gastroenterology Office Note     Primary Care Physician:  Caren Macadam, MD  Primary Gastroenterologist: Dr. Gala Romney    Chief Complaint   Chief Complaint  Patient presents with   Medication Refill    Still having some acid reflux      History of Present Illness   Renee Benitez is a 72 y.o. female presenting today in follow-up with a history of chronic GERD, colonoscopy due in 2026, here for routine refills.    No significant dysphagia. Worsening reflux noted although not daily. Has some good days. Minimal carbonated beverages. No caffeine. Avoiding fried foods. Taking pantoprazole on an empty stomach. No NSAIDs other than 81 mg aspirin.   NSTEMI in Feb 2024. Being treated for heart failure.   Past Medical History:  Diagnosis Date   Atrial fibrillation [facility])    Current use of estrogen therapy 01/31/2013   Dysrhythmia    AFib   Hypertension    Macular degeneration    Menopausal syndrome    Overactive bladder    Right ovarian cyst 01/25/2014    Past Surgical History:  Procedure Laterality Date   A-FLUTTER ABLATION N/A 03/18/2021   Procedure: A-FLUTTER ABLATION;  Surgeon: Evans Lance, MD;  Location: [place] CV LAB;  Service: Cardiovascular;  Laterality: N/A;   ABDOMINAL HYSTERECTOMY     APPENDECTOMY     CARDIOVERSION N/A 09/14/2016   Procedure: CARDIOVERSION;  Surgeon: Satira Sark, MD;  Location: [facility];  Service: Cardiovascular;  Laterality: N/A;   CARDIOVERSION N/A 02/09/2021   Procedure: CARDIOVERSION;  Surgeon: Josue Hector, MD;  Location: [facility] ENDOSCOPY;  Service: Cardiovascular;  Laterality: N/A;   CATARACT EXTRACTION W/ INTRAOCULAR LENS  IMPLANT, BILATERAL Bilateral    COLONOSCOPY  12/04/2003   Friable anal canal otherwise normal rectum and colon   COLONOSCOPY N/A 03/19/2013   Dr. Gala Romney: diverticulosis, hemorrhoids. next TCS 10 years   COLONOSCOPY N/A 05/02/2019   entire colon normal, non-bleeding external and internal hemorrhoids, grade  3. 5 year screening due to family history.   ESOPHAGOGASTRODUODENOSCOPY N/A 10/17/2018   moderately severe erosive esophagitis, esophagus status post dilation, small hiatal hernia, otherwise normal   IR ANGIO INTRA EXTRACRAN SEL INTERNAL CAROTID BILAT MOD SED  06/16/2021   IR ANGIO VERTEBRAL SEL VERTEBRAL UNI R MOD SED  06/16/2021   IR CT HEAD LTD  01/05/2021   IR CT HEAD LTD  01/05/2021   IR INTRA CRAN STENT  01/05/2021   IR PERCUTANEOUS ART THROMBECTOMY/INFUSION INTRACRANIAL INC DIAG ANGIO  01/05/2021   IR US GUIDE VASC ACCESS LEFT  01/05/2021   IR US GUIDE VASC ACCESS RIGHT  06/16/2021   MALONEY DILATION N/A 10/17/2018   Procedure: Venia Minks DILATION;  Surgeon: Daneil Dolin, MD;  [facility];  Service: Endoscopy;  Laterality: N/A;   RADIOLOGY WITH ANESTHESIA N/A 01/05/2021   Procedure: IR WITH ANESTHESIA;  Surgeon: Radiologist, Medication, MD;  Location: [place];  Service: Radiology;  Laterality: N/A;   RIGHT/LEFT HEART CATH AND CORONARY ANGIOGRAPHY N/A 05/20/2022   Procedure: RIGHT/LEFT HEART CATH AND CORONARY ANGIOGRAPHY;  Surgeon: Lorretta Harp, MD;  Location: [place] CV LAB;  Service: Cardiovascular;  Laterality: N/A;   TEE WITHOUT CARDIOVERSION N/A 09/14/2016   Procedure: TRANSESOPHAGEAL ECHOCARDIOGRAM (TEE) WITH PROPOFOL;  Surgeon: Satira Sark, MD;  Location: [facility];  Service: Cardiovascular;  Laterality: N/A;   TONSILECTOMY, ADENOIDECTOMY, BILATERAL MYRINGOTOMY AND TUBES      Current Outpatient Medications  Medication Sig Dispense Refill  acetaminophen (TYLENOL) 500 MG tablet Take 500-1,000 mg by mouth every 6 (six) hours as needed for moderate pain or headache.     aspirin 81 MG EC tablet Take 1 tablet (81 mg total) by mouth daily. 150 tablet 2   Calcium Carb-Cholecalciferol (CALCIUM 600 + D PO) Take 1 tablet by mouth every evening.     Cholecalciferol (DIALYVITE VITAMIN D 5000) 125 MCG (5000 UT) capsule Take 5,000 Units by mouth every evening.      ELIQUIS 5 MG TABS tablet TAKE ONE TABLET ('5MG'$  TOTAL) BY MOUTH TWOTIMES DAILY (Patient taking differently: Take 5 mg by mouth 2 (two) times daily.) 180 tablet 3   empagliflozin (JARDIANCE) 10 MG TABS tablet Take 1 tablet (10 mg total) by mouth daily. 30 tablet 1   escitalopram (LEXAPRO) 20 MG tablet Take 20 mg by mouth daily.     levothyroxine (SYNTHROID) 100 MCG tablet Take 100 mcg by mouth every morning.     Magnesium 250 MG TABS Take 250 mg by mouth in the morning and at bedtime. Takes magnesium citrate     metoprolol succinate (TOPROL XL) 50 MG 24 hr tablet Take 1 tablet (50 mg total) by mouth daily. 30 tablet 2   Mirabegron (MYRBETRIQ PO) Take 10 mg by mouth daily with lunch.     Multiple Vitamins-Minerals (MULTIVITAMIN WITH MINERALS) tablet Take 1 tablet by mouth in the morning. Women's One A Day     Multiple Vitamins-Minerals (PRESERVISION AREDS 2) CAPS Take 1 capsule by mouth 2 (two) times daily.     Omega-3 Fatty Acids (FISH OIL ULTRA) 1400 MG CAPS Take 1,400 mg by mouth in the morning.     pantoprazole (PROTONIX) 40 MG tablet TAKE ONE TABLET ('40MG'$  TOTAL) BY MOUTH DAILY (Patient taking differently: Take 40 mg by mouth daily.) 90 tablet 3   rosuvastatin (CRESTOR) 20 MG tablet Take 20 mg by mouth in the morning.     sacubitril-valsartan (ENTRESTO) 24-26 MG Take 1 tablet by mouth 2 (two) times daily. 60 tablet 11   loratadine (CLARITIN) 10 MG tablet Take 10 mg by mouth daily as needed for allergies. (Patient not taking: Reported on 06/08/2022)     No current facility-administered medications for this visit.    Allergies as of 06/08/2022   (No Known Allergies)    Family History  Problem Relation Age of Onset   Coronary artery disease Father    Cancer Father        bladder cancer   Arrhythmia Sister        Reportedly had ablation of SVT   Hypertension Sister    Colon cancer Sister 58       s/p surgical resection, no chemo/radiation   Colon cancer Maternal Grandmother     Hypertension Son     Social History   Socioeconomic History   Marital status: Married    Spouse name: Not on file   Number of children: Not on file   Years of education: Not on file   Highest education level: Not on file  Occupational History   Occupation: Nurse    Comment: Personal assistant  Tobacco Use   Smoking status: Never   Smokeless tobacco: Never   Tobacco comments:    Never smoked  Vaping Use   Vaping Use: Never used  Substance and Sexual Activity   Alcohol use: Not Currently    Comment: previous occasional, now none   Drug use: No   Sexual activity: Yes    Birth  control/protection: Surgical    Comment: hyst  Other Topics Concern   Not on file  Social History Narrative   Not on file   Social Determinants of Health   Financial Resource Strain: Low Risk  (05/20/2022)   Overall Financial Resource Strain (CARDIA)    Difficulty of Paying Living Expenses: Not very hard  Food Insecurity: No Food Insecurity (05/19/2022)   Hunger Vital Sign    Worried About Running Out of Food in the Last Year: Never true    Ran Out of Food in the Last Year: Never true  Transportation Needs: No Transportation Needs (05/19/2022)   PRAPARE - Hydrologist (Medical): No    Lack of Transportation (Non-Medical): No  Physical Activity: Insufficiently Active (07/16/2020)   Exercise Vital Sign    Days of Exercise per Week: 1 day    Minutes of Exercise per Session: 30 min  Stress: Stress Concern Present (07/16/2020)   Aspers    Feeling of Stress : To some extent  Social Connections: Socially Integrated (07/16/2020)   Social Connection and Isolation Panel [NHANES]    Frequency of Communication with Friends and Family: Three times a week    Frequency of Social Gatherings with Friends and Family: Twice a week    Attends Religious Services: More than 4 times per year    Active Member of [employer] or  Organizations: Yes    Attends Music therapist: More than 4 times per year    Marital Status: Married  Human resources officer Violence: Not At Risk (05/19/2022)   Humiliation, Afraid, Rape, and Kick questionnaire    Fear of Current or Ex-Partner: No    Emotionally Abused: No    Physically Abused: No    Sexually Abused: No     Review of Systems   Gen: Denies any fever, chills, fatigue, weight loss, lack of appetite.  CV: Denies chest pain, heart palpitations, peripheral edema, syncope.  Resp: Denies shortness of breath at rest or with exertion. Denies wheezing or cough.  GI: Denies dysphagia or odynophagia. Denies jaundice, hematemesis, fecal incontinence. GU : Denies urinary burning, urinary frequency, urinary hesitancy MS: Denies joint pain, muscle weakness, cramps, or limitation of movement.  Derm: Denies rash, itching, dry skin Psych: Denies depression, anxiety, memory loss, and confusion Heme: Denies bruising, bleeding, and enlarged lymph nodes.   Physical Exam   BP 112/74 (BP Location: Left Arm, Patient Position: Sitting, Cuff Size: Normal)   Pulse 97   Temp (!) 97.4 F (36.3 C) (Temporal)   Ht '5\' 6"'$  (1.676 m)   Wt 192 lb 9.6 oz (87.4 kg)   SpO2 99%   BMI 31.09 kg/m  General:   Alert and oriented. Pleasant and cooperative. Well-nourished and well-developed.  Head:  Normocephalic and atraumatic. Eyes:  Without icterus Abdomen:  +BS, soft, non-tender and non-distended. No HSM noted. No guarding or rebound. No masses appreciated.  Rectal:  Deferred  Msk:  Symmetrical without gross deformities. Normal posture. Extremities:  Without edema. Neurologic:  Alert and  oriented x4;  grossly normal neurologically. Skin:  Intact without significant lesions or rashes. Psych:  Alert and cooperative. Normal mood and affect.   Assessment   Renee Benitez is a 72 y.o. female presenting today in follow-up with a history of chronic GERD, colonoscopy due in 2026, here for  routine refills.   GERD: noting some breakthrough intermittently. Attempting excellent diet/behavior modifications. Will add Pepcid prn. No  alarm signs/symptoms. May need to change PPI therapy but will try this first.      PLAN    Continue pantoprazole daily Pepcid prn Message if no improvement in 2-4 weeks and will change PPI therapy 6 month follow-up   Annitta Needs, PhD, ANP-BC [facility]

## 2022-06-10 ENCOUNTER — Telehealth: Payer: Self-pay | Admitting: Internal Medicine

## 2022-06-10 NOTE — Telephone Encounter (Signed)
Call forwarded from [facility] triage to me.   Pt called and stated she thinks she is in Afib due to her watch.  Pt c/o of intermittent periods of fatigue;  Pt states her HR is between 88-94 bpm.   BP is 118/70 to 123/74.  PT was told to increase metoprolol succ to 75 mg Daily for HR control.    Pt stated she was taken off Flecainide and Diltiazem.    Dr. Lovena Le consulted; stated pt should continue taking 75 Mg of Toprol XL, and follow up in March at cardiology appointment.  No new orders for patient.    Pt educated, and told to reach back out to [place] with new or uncontrolled symptoms;  Pt understood advisement.

## 2022-06-10 NOTE — Telephone Encounter (Signed)
Patient c/o Palpitations:  High priority if patient c/o lightheadedness, shortness of breath, or chest pain  How long have you had palpitations/irregular HR/ Afib? Are you having the symptoms now? Afib, she is in Afib at this time- she says she stays in Afib  Are you currently experiencing lightheadedness, SOB or CP? Shortness of breath when she doles things or exert herself  Do you have a history of afib (atrial fibrillation) or irregular heart rhythm?   Have you checked your BP or HR? (document readings if available): blood pressure been normal  Are you experiencing any other symptoms? No- patient wonder if she needs to be seen

## 2022-06-16 ENCOUNTER — Other Ambulatory Visit (HOSPITAL_COMMUNITY): Payer: Self-pay

## 2022-06-18 ENCOUNTER — Other Ambulatory Visit (HOSPITAL_COMMUNITY): Payer: Self-pay

## 2022-06-18 ENCOUNTER — Ambulatory Visit (HOSPITAL_COMMUNITY)
Admission: RE | Admit: 2022-06-18 | Discharge: 2022-06-18 | Disposition: A | Discharge status: Home / Self Care | Payer: Medicare Other | Source: Ambulatory Visit | Attending: Family Medicine | Admitting: Family Medicine

## 2022-06-18 DIAGNOSIS — I5181 Takotsubo syndrome: Secondary | ICD-10-CM | POA: Diagnosis present

## 2022-06-18 LAB — ECHOCARDIOGRAM LIMITED: S' Lateral: 2.5 cm

## 2022-06-18 NOTE — Progress Notes (Signed)
*  PRELIMINARY RESULTS* Echocardiogram Limited 2-D Echocardiogram  has been performed.  Renee Benitez 06/18/2022, 2:38 PM

## 2022-06-23 ENCOUNTER — Other Ambulatory Visit
Admission: RE | Admit: 2022-06-23 | Discharge: 2022-06-23 | Disposition: A | Discharge status: Home / Self Care | Payer: Medicare Other | Attending: Cardiology | Admitting: Cardiology

## 2022-06-23 ENCOUNTER — Encounter: Payer: Self-pay | Admitting: Cardiology

## 2022-06-23 ENCOUNTER — Telehealth: Payer: Self-pay

## 2022-06-23 ENCOUNTER — Ambulatory Visit: Payer: Medicare Other | Admitting: Cardiology

## 2022-06-23 VITALS — BP 126/66 | HR 89 | Ht 66.0 in | Wt 191.6 lb

## 2022-06-23 DIAGNOSIS — I480 Paroxysmal atrial fibrillation: Secondary | ICD-10-CM

## 2022-06-23 DIAGNOSIS — I428 Other cardiomyopathies: Secondary | ICD-10-CM

## 2022-06-23 DIAGNOSIS — I10 Essential (primary) hypertension: Secondary | ICD-10-CM

## 2022-06-23 DIAGNOSIS — Z1231 Encounter for screening mammogram for malignant neoplasm of breast: Secondary | ICD-10-CM | POA: Diagnosis present

## 2022-06-23 DIAGNOSIS — Z79899 Other long term (current) drug therapy: Secondary | ICD-10-CM

## 2022-06-23 DIAGNOSIS — I5022 Chronic systolic (congestive) heart failure: Secondary | ICD-10-CM

## 2022-06-23 DIAGNOSIS — E782 Mixed hyperlipidemia: Secondary | ICD-10-CM

## 2022-06-23 DIAGNOSIS — I2510 Atherosclerotic heart disease of native coronary artery without angina pectoris: Secondary | ICD-10-CM

## 2022-06-23 LAB — BASIC METABOLIC PANEL
Anion gap: 8 (ref 5–15)
BUN: 20 mg/dL (ref 8–23)
CO2: 26 mmol/L (ref 22–32)
Calcium: 9.2 mg/dL (ref 8.9–10.3)
Chloride: 103 mmol/L (ref 98–111)
Creatinine, Ser: 0.94 mg/dL (ref 0.44–1.00)
GFR, Estimated: >60 mL/min (ref >60)
Glucose, Bld: 119 mg/dL — ABNORMAL HIGH (ref 70–99)
Potassium: 4 mmol/L (ref 3.5–5.1)
Sodium: 137 mmol/L (ref 135–145)

## 2022-06-23 MED ORDER — METOPROLOL SUCCINATE ER 100 MG PO TB24: 100.0000 mg | ORAL_TABLET | Freq: Every day | ORAL | 3 refills | Status: DC

## 2022-06-23 NOTE — Telephone Encounter (Signed)
-----   Message from Gerrie Nordmann, NP sent at 06/23/2022  3:47 PM EDT ----- Kidney function and electrolytes are stable. Continue with current medication regimen without changes.

## 2022-06-23 NOTE — Addendum Note (Signed)
Addended by: Berlinda Last on: 06/23/2022 03:14 PM   Modules accepted: Orders

## 2022-06-23 NOTE — Patient Instructions (Signed)
Medication Instructions:  Your physician has recommended you make the following change in your medication:  - Increase Metoprolol to 100 mg tablets daily.    Labwork: Today: - BNP  Testing/Procedures: None  Follow-Up: Follow up with Dr. Harl Bowie in 6 months. Follow up with Dr. Lovena Le   Any Other Special Instructions Will Be Listed Below (If Applicable).     If you need a refill on your cardiac medications before your next appointment, please call your pharmacy.

## 2022-06-23 NOTE — Progress Notes (Signed)
Cardiology Office Note:   Date:  06/23/2022  ID:  Renee Benitez, DOB 11/16/1950, MRN [id number]  History of Present Illness:   Renee Benitez is a 72 y.o. female with a history of PAF/atrial flutter s/p prior ablation in 2022, CVA 12/2020 felt secondary to a-fib and missed doses of apixaban s/p mechanical thrombectomy with stenting and angioplasty of right P1/PCA occlusion, HTN, HLD, OSA, mild-moderate AI by echocardiogram in 12/2020, HFrEF, CAD with NSTEMI 05/18/22, who present today for follow up.  She presented to [facility] on 05/18/22 with burning chest discomfort. Initial troponin was 989, trended 1756 and 1131. Concern for ACS. Echo revealed LVEF 30-35%, apical akinesis, new LV dysfunction. Underwent R/LHC showing non-obstructive single vessel CAD with 25% pLAD stenosis, severe LV dysfunction, mildly elevated R+L filling pressures and marginal cardiac output. Presentation was consistent with Takotsubo syndrome. She was diuresed with IV lasix and placed on GDMT. Flecainide and diltiazem was discontinued due to LV dysfunction.  She followed up at [facility] clinic with B. Simmons, [place] on 06/01/22 with concerns of being back in atrial fibrillation.She was having persistent palpitations since being off of flecainide. She was started on Entresto, changed to Toprol XL, and had been scheduled for a limited echocardiogram.  She returns to clinic today stating that she has remained in atrial fibrillation. She has also been monitoring and tracking strips with her smart watch and cell phone app. She had increased her Toprol XL to 75 mg as previously instructed without noticing a decrease in her a-fib. She has associated fatigue with the episodes. Her heart rate has been better controlled in recent episodes  ranging from 70-100. She states that she has not missed any doses of apixaban and has had no incident of bleeding from being on anticoagulation. She has tolerated Entresto without any issues with blood  pressure. She did have follow-up with Urology and is being trailed on different medications for overactive bladder. Denies any chest pain, shortness of breath, peripheral edema, dizziness, or syncope.   ROS: Endorses fatigue and palpitations from recurrent atrial fibrillation.  Studies Reviewed:    EKG:  No new tracing was completed today.  Limited echo 06/18/22 1. Left ventricular ejection fraction, by estimation, is 65 to 70%. The  left ventricle has normal function. The left ventricle has no regional  wall motion abnormalities. Left ventricular diastolic function could not  be evaluated.   2. The inferior vena cava is dilated in size with >50% respiratory  variability, suggesting right atrial pressure of 8 mmHg.   Comparison(s): Changes from prior study are noted. LVEF improved from  30-35% with RWMA on 05/19/2022 to normal LVEF with no RWMA now.   LHC 05/20/22   Prox LAD lesion is 25% stenosed.   There is severe left ventricular systolic dysfunction.   LV end diastolic pressure is moderately elevated.   The left ventricular ejection fraction is less than 25% by visual estimate.  TTE 05/19/22 1. Anteroseptal wall is akinetic except very small area at the base. The  mid to distal inferoseptum, anterolateral, anterior walls are akinetic.  Apex is akinetic. If coronary disease excluded pattern would fit Takotsubo  cardiomyopathy. . Left  ventricular ejection fraction, by estimation, is 30 to 35%. The left  ventricle has moderately decreased function. The left ventricle  demonstrates regional wall motion abnormalities (see scoring  diagram/findings for description). There is mild left  ventricular hypertrophy. Left ventricular diastolic parameters are  consistent with Grade II diastolic dysfunction (pseudonormalization).  Elevated  left atrial pressure.   2. Right ventricular systolic function is normal. The right ventricular  size is normal. There is mildly elevated pulmonary artery  systolic  pressure.   3. Left atrial size was mildly dilated.   4. The mitral valve is abnormal. Mild mitral valve regurgitation.   5. The tricuspid valve is abnormal.   6. The aortic valve is tricuspid. Aortic valve regurgitation is mild. No  aortic stenosis is present.   7. The inferior vena cava is normal in size with greater than 50%  respiratory variability, suggesting right atrial pressure of 3 mmHg.    Risk Assessment/Calculations:    CHA2DS2-VASc Score = 6   This indicates a 9.7% annual risk of stroke. The patient's score is based upon: CHF History: 0 HTN History: 1 Diabetes History: 0 Stroke History: 2 Vascular Disease History: 1 Age Score: 1 Gender Score: 1             Physical Exam:   VS:  BP 126/66   Pulse 89   Ht '5\' 6"'$  (1.676 m)   Wt 191 lb 9.6 oz (86.9 kg)   SpO2 97%   BMI 30.93 kg/m    Wt Readings from Last 3 Encounters:  06/23/22 191 lb 9.6 oz (86.9 kg)  06/08/22 192 lb 9.6 oz (87.4 kg)  06/01/22 190 lb 6.4 oz (86.4 kg)     GEN: Well nourished, well developed in no acute distress NECK: No JVD; No carotid bruits CARDIAC: irregularly irregular, no murmurs, rubs, gallops RESPIRATORY:  Clear to auscultation without rales, wheezing or rhonchi, respirations are unlabored at rest on room air ABDOMEN: Soft, non-tender, non-distended EXTREMITIES:  No edema; No deformity   ASSESSMENT AND PLAN:   HFimpEF with newly diagnosed heart failure and NICM during recent hospitalization mostly likely induced from acute GI illness. Echocardiogram revealed LVEF 30-35%, GDMT was escalated with Entresto and beta blocker changed to Toprol XL, and Jardiance, can consider spiro if potassium allows BMP ordered today with being on Entresto for a month at this time. NYHA Class I-II. Appears euvolemic on exam.  Not currently on loop diuretic. Repeat echo revealed LVEF 65-70%, with no RWMA. Improved since hospitalization. Weigh daily, limit sodium intake.  PAF/atrial flutter in and  out since being taken off of flecainide and diltiazem. Currently continued on apixaban 5 mg twice daily for a CHADS2-VASc score of at least 6. Associated symptoms of palpitations and fatigue with primarily being in A-fib. Discussed medications of amiodarone but with side effects concerns not an option today. Discussed repeat DCCV which patient has undergone 2 but did not hold and prefers not to undergo DCCV again. Option of A-fib clinic with medication changes and recommendations, maybe later on an option. Discussed appointment with Dr Lovena Le to discuss possible repeat ablation procedure. Patient would like to discuss that option with Dr Lovena Le. She was also not a fan of Tikosyn since it would cause repeat hospital stay. Toprol XL increased to 100 mg daily with refill sent to pharmacy of patients choice.   CAD noted on [facility] 2/24 with 1 vessel CAD of 25% to pLAD. Currently has remained chest pain free. Continued on ASA 81 mg daily and Crestor 20 mg daily. Will need repeat lipid and LFT in 6-8 weeks.   HTN with blood pressure today of 126/66. She has been continued on Entresto 24/26 mg BID, Toprol Xl 100 mg daily. Encouraged to monitor blood pressure at home as well.   HLD with LDL 55. Continued on crestor  20 mg daily. This continues to be managed by her PCP.  Disposition patient to return to clinic to see MD/APP in 6 months or sooner if needed.        Signed, Clemente Dewey, NP

## 2022-06-23 NOTE — Telephone Encounter (Signed)
Patient notified via My Chart

## 2022-06-29 ENCOUNTER — Ambulatory Visit (HOSPITAL_COMMUNITY): Payer: Medicare Other

## 2022-07-20 ENCOUNTER — Encounter: Payer: Self-pay | Admitting: Internal Medicine

## 2022-07-20 ENCOUNTER — Ambulatory Visit: Payer: Medicare Other | Attending: Internal Medicine | Admitting: Internal Medicine

## 2022-07-20 VITALS — BP 114/82 | HR 92 | Ht 66.0 in | Wt 191.8 lb

## 2022-07-20 DIAGNOSIS — I480 Paroxysmal atrial fibrillation: Secondary | ICD-10-CM | POA: Diagnosis not present

## 2022-07-20 NOTE — Progress Notes (Signed)
HPI Renee Benitez returns today for followup. She is a pleasant 72 yo woman with a h/o atrial fib who then developed atrial flutter on flecainide. She underwent EP study where she was found to have both right and left atrial flutter and both were successfully ablated. She has had a Takasubo CM, and her EF has normalized. She had her flecainide stopped and she has been out of rhythm for almost 2 months. Her rates are fairly well controlled.  No Known Allergies   Current Outpatient Medications  Medication Sig Dispense Refill   acetaminophen (TYLENOL) 500 MG tablet Take 500-1,000 mg by mouth every 6 (six) hours as needed for moderate pain or headache.     aspirin 81 MG EC tablet Take 1 tablet (81 mg total) by mouth daily. 150 tablet 2   Calcium Carb-Cholecalciferol (CALCIUM 600 + D PO) Take 1 tablet by mouth every evening.     Cholecalciferol (DIALYVITE VITAMIN D 5000) 125 MCG (5000 UT) capsule Take 5,000 Units by mouth every evening.     ELIQUIS 5 MG TABS tablet TAKE ONE TABLET (5MG  TOTAL) BY MOUTH TWOTIMES DAILY (Patient taking differently: Take 5 mg by mouth 2 (two) times daily.) 180 tablet 3   empagliflozin (JARDIANCE) 10 MG TABS tablet Take 1 tablet (10 mg total) by mouth daily. 30 tablet 1   escitalopram (LEXAPRO) 20 MG tablet Take 20 mg by mouth daily.     levothyroxine (SYNTHROID) 100 MCG tablet Take 100 mcg by mouth every morning.     loratadine (CLARITIN) 10 MG tablet Take 10 mg by mouth daily as needed for allergies.     Magnesium 250 MG TABS Take 250 mg by mouth in the morning and at bedtime. Takes magnesium citrate     metoprolol succinate (TOPROL-XL) 100 MG 24 hr tablet Take 1 tablet (100 mg total) by mouth daily. Take with or immediately following a meal. 90 tablet 3   Multiple Vitamins-Minerals (MULTIVITAMIN WITH MINERALS) tablet Take 1 tablet by mouth in the morning. Women's One A Day     Multiple Vitamins-Minerals (PRESERVISION AREDS 2) CAPS Take 1 capsule by mouth 2 (two)  times daily.     Omega-3 Fatty Acids (FISH OIL ULTRA) 1400 MG CAPS Take 1,400 mg by mouth in the morning.     oxybutynin (DITROPAN-XL) 10 MG 24 hr tablet Take 10 mg by mouth daily.     pantoprazole (PROTONIX) 40 MG tablet TAKE ONE TABLET (40MG  TOTAL) BY MOUTH DAILY 90 tablet 3   rosuvastatin (CRESTOR) 20 MG tablet Take 20 mg by mouth in the morning.     sacubitril-valsartan (ENTRESTO) 24-26 MG Take 1 tablet by mouth 2 (two) times daily. 60 tablet 11   No current facility-administered medications for this visit.     Past Medical History:  Diagnosis Date   Atrial fibrillation    Current use of estrogen therapy 01/31/2013   Dysrhythmia    AFib   Hypertension    Macular degeneration    Menopausal syndrome    Overactive bladder    Right ovarian cyst 01/25/2014    ROS:   All systems reviewed and negative except as noted in the HPI.   Past Surgical History:  Procedure Laterality Date   A-FLUTTER ABLATION N/A 03/18/2021   Procedure: A-FLUTTER ABLATION;  Surgeon: [name], MD;  Location: [facility] INVASIVE CV LAB;  Service: Cardiovascular;  Laterality: N/A;   ABDOMINAL HYSTERECTOMY     APPENDECTOMY     CARDIOVERSION N/A  09/14/2016   Procedure: CARDIOVERSION;  Surgeon: [name], MD;  Location: [facility] ORS;  Service: Cardiovascular;  Laterality: N/A;   CARDIOVERSION N/A 02/09/2021   Procedure: CARDIOVERSION;  Surgeon: [name], MD;  Location: [facility] ENDOSCOPY;  Service: Cardiovascular;  Laterality: N/A;   CATARACT EXTRACTION W/ INTRAOCULAR LENS  IMPLANT, BILATERAL Bilateral    COLONOSCOPY  12/04/2003   Friable anal canal otherwise normal rectum and colon   COLONOSCOPY N/A 03/19/2013   Dr. [name]: diverticulosis, hemorrhoids. next TCS 10 years   COLONOSCOPY N/A 05/02/2019   entire colon normal, non-bleeding external and internal hemorrhoids, grade 3. 5 year screening due to family history.   ESOPHAGOGASTRODUODENOSCOPY N/A 10/17/2018   moderately severe erosive esophagitis,  esophagus status post dilation, small hiatal hernia, otherwise normal   IR ANGIO INTRA EXTRACRAN SEL INTERNAL CAROTID BILAT MOD SED  06/16/2021   IR ANGIO VERTEBRAL SEL VERTEBRAL UNI R MOD SED  06/16/2021   IR CT HEAD LTD  01/05/2021   IR CT HEAD LTD  01/05/2021   IR INTRA CRAN STENT  01/05/2021   IR PERCUTANEOUS ART THROMBECTOMY/INFUSION INTRACRANIAL INC DIAG ANGIO  01/05/2021   IR US GUIDE VASC ACCESS LEFT  01/05/2021   IR US GUIDE VASC ACCESS RIGHT  06/16/2021   MALONEY DILATION N/A 10/17/2018   Procedure: Elease Hashimoto DILATION;  Surgeon: [name], MD;  Location: [facility] END[facility];  Service: Endoscopy;  Laterality: N/A;   RADIOLOGY WITH ANESTHESIA N/A 01/05/2021   Procedure: IR WITH ANESTHESIA;  Surgeon: Radiologist, Medication, MD;  Location: [facility] OR;  Service: Radiology;  Laterality: N/A;   RIGHT/LEFT HEART CATH AND CORONARY ANGIOGRAPHY N/A 05/20/2022   Procedure: RIGHT/LEFT HEART CATH AND CORONARY ANGIOGRAPHY;  Surgeon: Runell Gess, MD;  Location: [facility] INVASIVE CV LAB;  Service: Cardiovascular;  Laterality: N/A;   TEE WITHOUT CARDIOVERSION N/A 09/14/2016   Procedure: TRANSESOPHAGEAL ECHOCARDIOGRAM (TEE) WITH PROPOFOL;  Surgeon: [name], MD;  Location: [facility] ORS;  Service: Cardiovascular;  Laterality: N/A;   TONSILECTOMY, ADENOIDECTOMY, BILATERAL MYRINGOTOMY AND TUBES       Family History  Problem Relation Age of Onset   Coronary artery disease Father    Cancer Father        bladder cancer   Arrhythmia Sister        Reportedly had ablation of SVT   Hypertension Sister    Colon cancer Sister 79       s/p surgical resection, no chemo/radiation   Colon cancer Maternal Grandmother    Hypertension Son      Social History   Socioeconomic History   Marital status: Married    Spouse name: Not on file   Number of children: Not on file   Years of education: Not on file   Highest education level: Not on file  Occupational History   Occupation: Nurse    Comment: Scientist, research (medical)  Tobacco Use   Smoking status: Never   Smokeless tobacco: Never   Tobacco comments:    Never smoked  Vaping Use   Vaping Use: Never used  Substance and Sexual Activity   Alcohol use: Not Currently    Comment: previous occasional, now none   Drug use: No   Sexual activity: Yes    Birth control/protection: Surgical    Comment: hyst  Other Topics Concern   Not on file  Social History Narrative   Not on file   Social Determinants of Health   Financial Resource Strain: Low Risk  (05/20/2022)  Overall Financial Resource Strain (CARDIA)    Difficulty of Paying Living Expenses: Not very hard  Food Insecurity: No Food Insecurity (05/19/2022)   Hunger Vital Sign    Worried About Running Out of Food in the Last Year: Never true    Ran Out of Food in the Last Year: Never true  Transportation Needs: No Transportation Needs (05/19/2022)   PRAPARE - Administrator, Civil ServiceTransportation    Lack of Transportation (Medical): No    Lack of Transportation (Non-Medical): No  Physical Activity: Insufficiently Active (07/16/2020)   Exercise Vital Sign    Days of Exercise per Week: 1 day    Minutes of Exercise per Session: 30 min  Stress: Stress Concern Present (07/16/2020)   [employer] - Occupational Stress Questionnaire    Feeling of Stress : To some extent  Social Connections: Socially Integrated (07/16/2020)   Social Connection and Isolation Panel [NHANES]    Frequency of Communication with Friends and Family: Three times a week    Frequency of Social Gatherings with Friends and Family: Twice a week    Attends Religious Services: More than 4 times per year    Active Member of [employer] FinancialClubs or Organizations: Yes    Attends Engineer, structuralClub or Organization Meetings: More than 4 times per year    Marital Status: Married  Catering managerntimate Partner Violence: Not At Risk (05/19/2022)   Humiliation, Afraid, Rape, and Kick questionnaire    Fear of Current or Ex-Partner: No    Emotionally Abused: No    Physically  Abused: No    Sexually Abused: No     BP 114/82   Pulse 92   Ht 5\' 6"  (1.676 m)   Wt 191 lb 12.8 oz (87 kg)   SpO2 99%   BMI 30.96 kg/m   Physical Exam:  Well appearing NAD HEENT: Unremarkable Neck:  No JVD, no thyromegally Lymphatics:  No adenopathy Back:  No CVA tenderness Lungs:  Clear with no wheezes HEART:  Regular rate rhythm, no murmurs, no rubs, no clicks Abd:  soft, positive bowel sounds, no organomegally, no rebound, no guarding Ext:  2 plus pulses, no edema, no cyanosis, no clubbing Skin:  No rashes no nodules Neuro:  CN II through XII intact, motor grossly intact  EKG - reviewed. Atrial fib  Assess/Plan: Atrial fib - I discussed the multiple options from rate control, to dofetilide to catheter ablation. She would like to pursue catheter ablation.  Atrial flutter - she has not had more since her ablation. Coags - she will continue eliquis. Takasubo CM - her EF has normalized. I would be inclined to continue her meds and consider stopping after ablation.   Renee GowdaGregg Marili Vader,MD

## 2022-07-20 NOTE — Patient Instructions (Signed)
Medication Instructions:  Your physician recommends that you continue on your current medications as directed. Please refer to the Current Medication list given to you today.  *If you need a refill on your cardiac medications before your next appointment, please call your pharmacy*   Lab Work: NONE   If you have labs (blood work) drawn today and your tests are completely normal, you will receive your results only by: MyChart Message (if you have MyChart) OR A paper copy in the mail If you have any lab test that is abnormal or we need to change your treatment, we will call you to review the results.   Testing/Procedures: NONE    Follow-Up: At [facility], you and your health needs are our priority.  As part of our continuing mission to provide you with exceptional heart care, we have created designated Provider Care Teams.  These Care Teams include your primary Cardiologist (physician) and Advanced Practice Providers (APPs -  Physician Assistants and Nurse Practitioners) who all work together to provide you with the care you need, when you need it.  We recommend signing up for the patient portal called "MyChart".  Sign up information is provided on this After Visit Summary.  MyChart is used to connect with patients for Virtual Visits (Telemedicine).  Patients are able to view lab/test results, encounter notes, upcoming appointments, etc.  Non-urgent messages can be sent to your provider as well.   To learn more about what you can do with MyChart, go to [email or web address].    Your next appointment:    Next available   Provider:   Dr. Mealor     Other Instructions Thank you for choosing [facility]!    

## 2022-07-20 NOTE — Progress Notes (Signed)
HPI  No Known Allergies   Current Outpatient Medications  Medication Sig Dispense Refill   acetaminophen (TYLENOL) 500 MG tablet Take 500-1,000 mg by mouth every 6 (six) hours as needed for moderate pain or headache.     aspirin 81 MG EC tablet Take 1 tablet (81 mg total) by mouth daily. 150 tablet 2   Calcium Carb-Cholecalciferol (CALCIUM 600 + D PO) Take 1 tablet by mouth every evening.     Cholecalciferol (DIALYVITE VITAMIN D 5000) 125 MCG (5000 UT) capsule Take 5,000 Units by mouth every evening.     ELIQUIS 5 MG TABS tablet TAKE ONE TABLET (5MG  TOTAL) BY MOUTH TWOTIMES DAILY (Patient taking differently: Take 5 mg by mouth 2 (two) times daily.) 180 tablet 3   empagliflozin (JARDIANCE) 10 MG TABS tablet Take 1 tablet (10 mg total) by mouth daily. 30 tablet 1   escitalopram (LEXAPRO) 20 MG tablet Take 20 mg by mouth daily.     levothyroxine (SYNTHROID) 100 MCG tablet Take 100 mcg by mouth every morning.     loratadine (CLARITIN) 10 MG tablet Take 10 mg by mouth daily as needed for allergies.     Magnesium 250 MG TABS Take 250 mg by mouth in the morning and at bedtime. Takes magnesium citrate     metoprolol succinate (TOPROL-XL) 100 MG 24 hr tablet Take 1 tablet (100 mg total) by mouth daily. Take with or immediately following a meal. 90 tablet 3   Multiple Vitamins-Minerals (MULTIVITAMIN WITH MINERALS) tablet Take 1 tablet by mouth in the morning. Women's One A Day     Multiple Vitamins-Minerals (PRESERVISION AREDS 2) CAPS Take 1 capsule by mouth 2 (two) times daily.     Omega-3 Fatty Acids (FISH OIL ULTRA) 1400 MG CAPS Take 1,400 mg by mouth in the morning.     oxybutynin (DITROPAN-XL) 10 MG 24 hr tablet Take 10 mg by mouth daily.     pantoprazole (PROTONIX) 40 MG tablet TAKE ONE TABLET (40MG  TOTAL) BY MOUTH DAILY 90 tablet 3   rosuvastatin (CRESTOR) 20 MG tablet Take 20 mg by mouth in the morning.     sacubitril-valsartan (ENTRESTO) 24-26 MG Take 1 tablet by mouth 2 (two) times  daily. 60 tablet 11   No current facility-administered medications for this visit.     Past Medical History:  Diagnosis Date   Atrial fibrillation    Current use of estrogen therapy 01/31/2013   Dysrhythmia    AFib   Hypertension    Macular degeneration    Menopausal syndrome    Overactive bladder    Right ovarian cyst 01/25/2014    ROS:   All systems reviewed and negative except as noted in the HPI.   Past Surgical History:  Procedure Laterality Date   A-FLUTTER ABLATION N/A 03/18/2021   Procedure: A-FLUTTER ABLATION;  Surgeon: [name], MD;  Location: [facility] INVASIVE CV LAB;  Service: Cardiovascular;  Laterality: N/A;   ABDOMINAL HYSTERECTOMY     APPENDECTOMY     CARDIOVERSION N/A 09/14/2016   Procedure: CARDIOVERSION;  Surgeon: [name], MD;  Location: [facility] OR[facility];  Service: Cardiovascular;  Laterality: N/A;   CARDIOVERSION N/A 02/09/2021   Procedure: CARDIOVERSION;  Surgeon: [name], MD;  Location: [facility] ENDOSCOPY;  Service: Cardiovascular;  Laterality: N/A;   CATARACT EXTRACTION W/ INTRAOCULAR LENS  IMPLANT, BILATERAL Bilateral    COLONOSCOPY  12/04/2003   Friable anal canal otherwise normal rectum and colon   COLONOSCOPY N/A 03/19/2013  Dr. [name]: diverticulosis, hemorrhoids. next TCS 10 years   COLONOSCOPY N/A 05/02/2019   entire colon normal, non-bleeding external and internal hemorrhoids, grade 3. 5 year screening due to family history.   ESOPHAGOGASTRODUODENOSCOPY N/A 10/17/2018   moderately severe erosive esophagitis, esophagus status post dilation, small hiatal hernia, otherwise normal   IR ANGIO INTRA EXTRACRAN SEL INTERNAL CAROTID BILAT MOD SED  06/16/2021   IR ANGIO VERTEBRAL SEL VERTEBRAL UNI R MOD SED  06/16/2021   IR CT HEAD LTD  01/05/2021   IR CT HEAD LTD  01/05/2021   IR INTRA CRAN STENT  01/05/2021   IR PERCUTANEOUS ART THROMBECTOMY/INFUSION INTRACRANIAL INC DIAG ANGIO  01/05/2021   IR US GUIDE VASC ACCESS LEFT  01/05/2021   IR US  GUIDE VASC ACCESS RIGHT  06/16/2021   MALONEY DILATION N/A 10/17/2018   Procedure: Elease Hashimoto DILATION;  Surgeon: [name], MD;  [facility];  Service: Endoscopy;  Laterality: N/A;   RADIOLOGY WITH ANESTHESIA N/A 01/05/2021   Procedure: IR WITH ANESTHESIA;  Surgeon: Radiologist, Medication, MD;  Location: [facility] OR;  Service: Radiology;  Laterality: N/A;   RIGHT/LEFT HEART CATH AND CORONARY ANGIOGRAPHY N/A 05/20/2022   Procedure: RIGHT/LEFT HEART CATH AND CORONARY ANGIOGRAPHY;  Surgeon: Runell Gess, MD;  Location: [facility] INVASIVE CV LAB;  Service: Cardiovascular;  Laterality: N/A;   TEE WITHOUT CARDIOVERSION N/A 09/14/2016   Procedure: TRANSESOPHAGEAL ECHOCARDIOGRAM (TEE) WITH PROPOFOL;  Surgeon: [name], MD;  Location: [facility];  Service: Cardiovascular;  Laterality: N/A;   TONSILECTOMY, ADENOIDECTOMY, BILATERAL MYRINGOTOMY AND TUBES       Family History  Problem Relation Age of Onset   Coronary artery disease Father    Cancer Father        bladder cancer   Arrhythmia Sister        Reportedly had ablation of SVT   Hypertension Sister    Colon cancer Sister 14       s/p surgical resection, no chemo/radiation   Colon cancer Maternal Grandmother    Hypertension Son      Social History   Socioeconomic History   Marital status: Married    Spouse name: Not on file   Number of children: Not on file   Years of education: Not on file   Highest education level: Not on file  Occupational History   Occupation: Nurse    Comment: Copywriter, advertising  Tobacco Use   Smoking status: Never   Smokeless tobacco: Never   Tobacco comments:    Never smoked  Vaping Use   Vaping Use: Never used  Substance and Sexual Activity   Alcohol use: Not Currently    Comment: previous occasional, now none   Drug use: No   Sexual activity: Yes    Birth control/protection: Surgical    Comment: hyst  Other Topics Concern   Not on file  Social History Narrative   Not on file    Social Determinants of Health   Financial Resource Strain: Low Risk  (05/20/2022)   Overall Financial Resource Strain (CARDIA)    Difficulty of Paying Living Expenses: Not very hard  Food Insecurity: No Food Insecurity (05/19/2022)   Hunger Vital Sign    Worried About Running Out of Food in the Last Year: Never true    Ran Out of Food in the Last Year: Never true  Transportation Needs: No Transportation Needs (05/19/2022)   PRAPARE - Transportation    Lack of Transportation (Medical): No    Lack  of Transportation (Non-Medical): No  Physical Activity: Insufficiently Active (07/16/2020)   Exercise Vital Sign    Days of Exercise per Week: 1 day    Minutes of Exercise per Session: 30 min  Stress: Stress Concern Present (07/16/2020)   [employer] - Occupational Stress Questionnaire    Feeling of Stress : To some extent  Social Connections: Socially Integrated (07/16/2020)   Social Connection and Isolation Panel [NHANES]    Frequency of Communication with Friends and Family: Three times a week    Frequency of Social Gatherings with Friends and Family: Twice a week    Attends Religious Services: More than 4 times per year    Active Member of [employer] FinancialClubs or Organizations: Yes    Attends Engineer, structuralClub or Organization Meetings: More than 4 times per year    Marital Status: Married  Catering managerntimate Partner Violence: Not At Risk (05/19/2022)   Humiliation, Afraid, Rape, and Kick questionnaire    Fear of Current or Ex-Partner: No    Emotionally Abused: No    Physically Abused: No    Sexually Abused: No     BP 114/82   Pulse 92   Ht 5\' 6"  (1.676 m)   Wt 191 lb 12.8 oz (87 kg)   SpO2 99%   BMI 30.96 kg/m   Physical Exam:  Well appearing NAD HEENT: Unremarkable Neck:  No JVD, no thyromegally Lymphatics:  No adenopathy Back:  No CVA tenderness Lungs:  Clear HEART:  Regular rate rhythm, no murmurs, no rubs, no clicks Abd:  soft, positive bowel sounds, no organomegally, no rebound, no  guarding Ext:  2 plus pulses, no edema, no cyanosis, no clubbing Skin:  No rashes no nodules Neuro:  CN II through XII intact, motor grossly intact  EKG  DEVICE  Normal device function.  See PaceArt for details.   Assess/Plan:

## 2022-07-22 ENCOUNTER — Telehealth: Payer: Self-pay

## 2022-07-22 NOTE — Telephone Encounter (Signed)
Message shared with Dr. Ladona Ridgel, Pt will need to follow up with PCP regarding Jardiance 10 mg refill, as a providers assessment is required.  A MyChart message will be sent to the Pt explaining this.

## 2022-07-22 NOTE — Telephone Encounter (Signed)
Pt is calling requesting a refill on jardiance 10 mg tablet. This medication is no longer on pt's medication list. It looks like this medication has expired off of pt's medication list. Would Dr. Ladona Ridgel like to reorder this medication so pt can get her medication? Please address

## 2022-07-26 ENCOUNTER — Telehealth: Payer: Self-pay

## 2022-07-26 MED ORDER — EMPAGLIFLOZIN 10 MG PO TABS: 10.0000 mg | ORAL_TABLET | Freq: Every day | ORAL | 3 refills | Status: DC

## 2022-07-26 NOTE — Telephone Encounter (Signed)
Okay to refill Jardiance 10 mg per Charlsie Quest, NP

## 2022-08-11 ENCOUNTER — Ambulatory Visit: Payer: Medicare Other | Attending: Cardiovascular Disease | Admitting: Cardiovascular Disease

## 2022-08-11 ENCOUNTER — Ambulatory Visit: Payer: Medicare Other | Admitting: Cardiovascular Disease

## 2022-08-11 ENCOUNTER — Encounter: Payer: Self-pay | Admitting: Cardiovascular Disease

## 2022-08-11 VITALS — BP 110/68 | HR 95 | Ht 66.0 in | Wt 190.6 lb

## 2022-08-11 DIAGNOSIS — I480 Paroxysmal atrial fibrillation: Secondary | ICD-10-CM | POA: Diagnosis not present

## 2022-08-11 NOTE — Progress Notes (Signed)
Electrophysiology Office Note:    Date:  08/11/2022   ID:  Renee Benitez, DOB [birth year], MRN [id number]  PCP:  Aliene Beams, MD   [facility] Providers Cardiologist:  Dina Rich, MD Electrophysiologist:  Lewayne Bunting, MD     Referring MD: [name], MD   History of Present Illness:    Renee Benitez is a 72 y.o. female with a hx listed below, significant for atrial fibrillation and flutter, Takotsubo cardiomyopathy with recovered EF referred for arrhythmia management.  She has a history of both typical and atypical flutter, both of which were successfully ablated. She has done well on flecainide, but this was discontinued in February when she was admitted for NSTEMI.  She discussed management options with her primary EP, Dr. Ladona Ridgel and she expressed a preference for ablation rather than an alternative medication.  Past Medical History:  Diagnosis Date   Atrial fibrillation [facility])    Current use of estrogen therapy 01/31/2013   Dysrhythmia    AFib   Hypertension    Macular degeneration    Menopausal syndrome    Overactive bladder    Right ovarian cyst 01/25/2014    Past Surgical History:  Procedure Laterality Date   A-FLUTTER ABLATION N/A 03/18/2021   Procedure: A-FLUTTER ABLATION;  Surgeon: [name], MD;  Location: [facility] INVASIVE CV LAB;  Service: Cardiovascular;  Laterality: N/A;   ABDOMINAL HYSTERECTOMY     APPENDECTOMY     CARDIOVERSION N/A 09/14/2016   Procedure: CARDIOVERSION;  Surgeon: [name], MD;  Location: [facility] ORS;  Service: Cardiovascular;  Laterality: N/A;   CARDIOVERSION N/A 02/09/2021   Procedure: CARDIOVERSION;  Surgeon: [name], MD;  Location: [facility] ENDOSCOPY;  Service: Cardiovascular;  Laterality: N/A;   CATARACT EXTRACTION W/ INTRAOCULAR LENS  IMPLANT, BILATERAL Bilateral    COLONOSCOPY  12/04/2003   Friable anal canal otherwise normal rectum and colon   COLONOSCOPY N/A 03/19/2013   Dr. [name]:  diverticulosis, hemorrhoids. next TCS 10 years   COLONOSCOPY N/A 05/02/2019   entire colon normal, non-bleeding external and internal hemorrhoids, grade 3. 5 year screening due to family history.   ESOPHAGOGASTRODUODENOSCOPY N/A 10/17/2018   moderately severe erosive esophagitis, esophagus status post dilation, small hiatal hernia, otherwise normal   IR ANGIO INTRA EXTRACRAN SEL INTERNAL CAROTID BILAT MOD SED  06/16/2021   IR ANGIO VERTEBRAL SEL VERTEBRAL UNI R MOD SED  06/16/2021   IR CT HEAD LTD  01/05/2021   IR CT HEAD LTD  01/05/2021   IR INTRA CRAN STENT  01/05/2021   IR PERCUTANEOUS ART THROMBECTOMY/INFUSION INTRACRANIAL INC DIAG ANGIO  01/05/2021   IR US GUIDE VASC ACCESS LEFT  01/05/2021   IR US GUIDE VASC ACCESS RIGHT  06/16/2021   MALONEY DILATION N/A 10/17/2018   Procedure: Elease Hashimoto DILATION;  Surgeon: [name], MD;  Location: [facility] ENDO SU[facility];  Service: Endoscopy;  Laterality: N/A;   RADIOLOGY WITH ANESTHESIA N/A 01/05/2021   Procedure: IR WITH ANESTHESIA;  Surgeon: Radiologist, Medication, MD;  Location: [facility] OR;  Service: Radiology;  Laterality: N/A;   RIGHT/LEFT HEART CATH AND CORONARY ANGIOGRAPHY N/A 05/20/2022   Procedure: RIGHT/LEFT HEART CATH AND CORONARY ANGIOGRAPHY;  Surgeon: Runell Gess, MD;  Location: [facility] INVASIVE CV LAB;  Service: Cardiovascular;  Laterality: N/A;   TEE WITHOUT CARDIOVERSION N/A 09/14/2016   Procedure: TRANSESOPHAGEAL ECHOCARDIOGRAM (TEE) WITH PROPOFOL;  Surgeon: [name], MD;  Location: [facility] ORS;  Service: Cardiovascular;  Laterality: N/A;   TONSILECTOMY, ADENOIDECTOMY, BILATERAL MYRINGOTOMY  AND TUBES      Current Medications: Current Meds  Medication Sig   acetaminophen (TYLENOL) 500 MG tablet Take 500-1,000 mg by mouth every 6 (six) hours as needed for moderate pain or headache.   aspirin 81 MG EC tablet Take 1 tablet (81 mg total) by mouth daily.   Calcium Carb-Cholecalciferol (CALCIUM 600 + D PO) Take 1 tablet by mouth every  evening.   Cholecalciferol (DIALYVITE VITAMIN D 5000) 125 MCG (5000 UT) capsule Take 5,000 Units by mouth every evening.   ELIQUIS 5 MG TABS tablet TAKE ONE TABLET (5MG  TOTAL) BY MOUTH TWOTIMES DAILY (Patient taking differently: Take 5 mg by mouth 2 (two) times daily.)   empagliflozin (JARDIANCE) 10 MG TABS tablet Take 1 tablet (10 mg total) by mouth daily before breakfast.   escitalopram (LEXAPRO) 20 MG tablet Take 20 mg by mouth daily.   levothyroxine (SYNTHROID) 100 MCG tablet Take 100 mcg by mouth every morning.   loratadine (CLARITIN) 10 MG tablet Take 10 mg by mouth daily as needed for allergies.   Magnesium 250 MG TABS Take 250 mg by mouth in the morning and at bedtime. Takes magnesium citrate   metoprolol succinate (TOPROL-XL) 100 MG 24 hr tablet Take 1 tablet (100 mg total) by mouth daily. Take with or immediately following a meal.   Multiple Vitamins-Minerals (MULTIVITAMIN WITH MINERALS) tablet Take 1 tablet by mouth in the morning. Women's One A Day   Multiple Vitamins-Minerals (PRESERVISION AREDS 2) CAPS Take 1 capsule by mouth 2 (two) times daily.   Omega-3 Fatty Acids (FISH OIL ULTRA) 1400 MG CAPS Take 1,400 mg by mouth in the morning.   pantoprazole (PROTONIX) 40 MG tablet TAKE ONE TABLET (40MG  TOTAL) BY MOUTH DAILY   rosuvastatin (CRESTOR) 20 MG tablet Take 20 mg by mouth in the morning.   sacubitril-valsartan (ENTRESTO) 24-26 MG Take 1 tablet by mouth 2 (two) times daily.   solifenacin (VESICARE) 5 MG tablet Take 5 mg by mouth daily.     Allergies:   Patient has no known allergies.   Social and Family History: Reviewed in Epic  ROS:   Please see the history of present illness.    All other systems reviewed and are negative.  EKGs/Labs/Other Studies Reviewed Today:    Echocardiogram:  TTE 05/21/2022  1. Anteroseptal wall is akinetic except very small area at the base. The  mid to distal inferoseptum, anterolateral, anterior walls are akinetic.  Apex is akinetic. If  coronary disease excluded pattern would fit Takotsubo  cardiomyopathy. . Left  ventricular ejection fraction, by estimation, is 30 to 35%. The left  ventricle has moderately decreased function. The left ventricle  demonstrates regional wall motion abnormalities (see scoring  diagram/findings for description). There is mild left  ventricular hypertrophy. Left ventricular diastolic parameters are  consistent with Grade II diastolic dysfunction (pseudonormalization).  Elevated left atrial pressure.   2. Right ventricular systolic function is normal. The right ventricular  size is normal. There is mildly elevated pulmonary artery systolic  pressure.   3. Left atrial size was mildly dilated.   4. The mitral valve is abnormal. Mild mitral valve regurgitation.   5. The tricuspid valve is abnormal.   6. The aortic valve is tricuspid. Aortic valve regurgitation is mild. No  aortic stenosis is present.   7. The inferior vena cava is normal in size with greater than 50%  respiratory variability, suggesting right atrial pressure of 3 mmHg.   TTE 06/18/2022 1. Left ventricular ejection fraction,  by estimation, is 65 to 70% . The left ventricle has normal function. The left ventricle has no regional wall motion abnormalities. Left ventricular diastolic function could not be evaluated. 2. The inferior vena cava is dilated in size with > 50% respiratory variability, suggesting right atrial pressure of 8 mmHg.  Monitors:   Stress testing:   Advanced imaging:    EKG:  Last EKG results: today -- atrial fibrillation with controlled rates   Recent Labs: 05/18/2022: ALT 25 05/19/2022: TSH 2.971 05/20/2022: Magnesium 2.1 05/21/2022: Hemoglobin 12.7; Platelets 193 06/01/2022: B Natriuretic Peptide 829.1 06/23/2022: BUN 20; Creatinine, Ser 0.94; Potassium 4.0; Sodium 137     Physical Exam:    VS:  BP 110/68   Pulse 95   Ht 5\' 6"  (1.676 m)   Wt 190 lb 9.6 oz (86.5 kg)   SpO2 99%   BMI 30.76 kg/m      Wt Readings from Last 3 Encounters:  08/11/22 190 lb 9.6 oz (86.5 kg)  07/20/22 191 lb 12.8 oz (87 kg)  06/23/22 191 lb 9.6 oz (86.9 kg)     GEN: Well nourished, well developed in no acute distress CARDIAC: iRRR, no murmurs, rubs, gallops RESPIRATORY:  Normal work of breathing MUSCULOSKELETAL: no edema    ASSESSMENT & PLAN:    Persistent atrial fibrillation Symptomatic with fatigue, senses an irregular heart rhythm We reviewed management options. She would prefer rhythm control due to her symptoms and would rather have a procedure than medication.  We discussed the indication, rationale, logistics, anticipated benefits, and potential risks of the ablation procedure including but not limited to -- bleed at the groin access site, chest pain, damage to nearby organs such as the diaphragm, lungs, or esophagus, need for a drainage tube, or prolonged hospitalization. I explained that the risk for stroke, heart attack, need for open chest surgery, or even death is very low but not zero. she  expressed understanding and wishes to proceed.   History of typical and atypical atrial flutters No evidence of recurrence after ablation  Secondary hypercoagulable state Continue Apixaban 5mg   CHFrEF EF 30-35% on 05/19/2022 recovered on 06/18/22 TTE        Medication Adjustments/Labs and Tests Ordered: Current medicines are reviewed at length with the patient today.  Concerns regarding medicines are outlined above.  No orders of the defined types were placed in this encounter.  No orders of the defined types were placed in this encounter.    Signed, Maurice Small, MD  08/11/2022 2:21 PM    [facility]

## 2022-08-11 NOTE — Patient Instructions (Signed)
Medication Instructions:  Your physician recommends that you continue on your current medications as directed. Please refer to the Current Medication list given to you today. *If you need a refill on your cardiac medications before your next appointment, please call your pharmacy*   Lab Work: CBC and BMET on 10/15/22 (come at any point that day) If you have labs (blood work) drawn today and your tests are completely normal, you will receive your results only by: MyChart Message (if you have MyChart) OR A paper copy in the mail If you have any lab test that is abnormal or we need to change your treatment, we will call you to review the results.   Testing/Procedures: Atrial Fibrillation Ablation Your physician has recommended that you have an ablation. Catheter ablation is a medical procedure used to treat some cardiac arrhythmias (irregular heartbeats). During catheter ablation, a long, thin, flexible tube is put into a blood vessel in your groin (upper thigh), or neck. This tube is called an ablation catheter. It is then guided to your heart through the blood vessel. Radio frequency waves destroy small areas of heart tissue where abnormal heartbeats may cause an arrhythmia to start. Please see the instruction sheet given to you today.  You are scheduled for Atrial Fibrillation Ablation on Friday, July 26 with Dr. Halford Chessman.Please arrive at the Main Entrance A at [facility]: [address] [place], [place] [address] at 11:30 AM     Follow-Up: At [facility], you and your health needs are our priority.  As part of our continuing mission to provide you with exceptional heart care, we have created designated Provider Care Teams.  These Care Teams include your primary Cardiologist (physician) and Advanced Practice Providers (APPs -  Physician Assistants and Nurse Practitioners) who all work together to provide you with the care you need, when you need it.  We recommend signing up  for the patient portal called "MyChart".  Sign up information is provided on this After Visit Summary.  MyChart is used to connect with patients for Virtual Visits (Telemedicine).  Patients are able to view lab/test results, encounter notes, upcoming appointments, etc.  Non-urgent messages can be sent to your provider as well.   To learn more about what you can do with MyChart, go to [email or web address].    Your next appointment:   After ablation   Provider:   York Pellant, MD

## 2022-08-16 ENCOUNTER — Other Ambulatory Visit: Payer: Self-pay

## 2022-08-16 DIAGNOSIS — I4891 Unspecified atrial fibrillation: Secondary | ICD-10-CM

## 2022-09-27 ENCOUNTER — Other Ambulatory Visit (HOSPITAL_COMMUNITY): Payer: Self-pay | Admitting: Physician Assistant

## 2022-09-27 NOTE — Telephone Encounter (Signed)
Prescription refill request for Eliquis received. Indication: AF Last office visit: 08/11/22  A Mealor MD Scr: 0.94 on 06/23/22  Epic Age: 72 Weight: 86.5kg  Based on above findings Eliquis 5mg  twice daily is the appropriate dose.  Refill approved.

## 2022-10-15 ENCOUNTER — Other Ambulatory Visit: Payer: Medicare Other

## 2022-10-15 ENCOUNTER — Other Ambulatory Visit (HOSPITAL_COMMUNITY): Admission: RE | Admit: 2022-10-15 | Payer: Medicare Other | Source: Ambulatory Visit

## 2022-10-15 DIAGNOSIS — I4891 Unspecified atrial fibrillation: Secondary | ICD-10-CM | POA: Diagnosis present

## 2022-10-15 LAB — BASIC METABOLIC PANEL
Anion gap: 7 (ref 5–15)
BUN: 21 mg/dL (ref 8–23)
CO2: 23 mmol/L (ref 22–32)
Calcium: 9.1 mg/dL (ref 8.9–10.3)
Chloride: 107 mmol/L (ref 98–111)
Creatinine, Ser: 0.88 mg/dL (ref 0.44–1.00)
GFR, Estimated: >60 mL/min (ref >60)
Glucose, Bld: 91 mg/dL (ref 70–99)
Potassium: 4 mmol/L (ref 3.5–5.1)
Sodium: 137 mmol/L (ref 135–145)

## 2022-10-15 LAB — CBC
HCT: 46.4 % — ABNORMAL HIGH (ref 36.0–46.0)
Hemoglobin: 15.5 g/dL — ABNORMAL HIGH (ref 12.0–15.0)
MCH: 30.3 pg (ref 26.0–34.0)
MCHC: 33.4 g/dL (ref 30.0–36.0)
MCV: 90.6 fL (ref 80.0–100.0)
Platelets: 230 10*3/uL (ref 150–400)
RBC: 5.12 MIL/uL — ABNORMAL HIGH (ref 3.87–5.11)
RDW: 13 % (ref 11.5–15.5)
WBC: 8.3 10*3/uL (ref 4.0–10.5)
nRBC: 0 % (ref 0.0–0.2)

## 2022-10-27 ENCOUNTER — Telehealth (HOSPITAL_COMMUNITY): Payer: Self-pay | Admitting: *Deleted

## 2022-10-27 NOTE — Telephone Encounter (Signed)
 Reaching out to patient to offer assistance regarding upcoming cardiac imaging study; pt verbalizes understanding of appt date/time, parking situation and where to check in, pre-test NPO status and verified current allergies; name and call back number provided for further questions should they arise  Merle Prescott RN Navigator Cardiac Imaging [facility] [phone] office [phone] cell  Patient aware to arrive at 11am. 

## 2022-10-29 ENCOUNTER — Ambulatory Visit (HOSPITAL_COMMUNITY)
Admission: RE | Admit: 2022-10-29 | Discharge: 2022-10-29 | Disposition: A | Discharge status: Home / Self Care | Payer: Medicare Other | Source: Ambulatory Visit | Attending: Cardiovascular Disease | Admitting: Cardiovascular Disease

## 2022-10-29 DIAGNOSIS — I480 Paroxysmal atrial fibrillation: Secondary | ICD-10-CM | POA: Diagnosis not present

## 2022-10-29 MED ORDER — IOHEXOL 350 MG/ML SOLN
95.0000 mL | Freq: Once | INTRAVENOUS | Status: AC | PRN
  Administered 2022-10-29: 95 mL via INTRAVENOUS

## 2022-11-04 NOTE — Pre-Procedure Instructions (Signed)
Instructed patient on the following items: Arrival time 1030 Nothing to eat or drink after midnight No meds AM of procedure Responsible person to drive you home and stay with you for 24 hrs  Have you missed any doses of anti-coagulant Eliquis- takes twice a day, hasn't missed any doses.  Don't take dose in the morning.

## 2022-11-05 ENCOUNTER — Encounter (HOSPITAL_COMMUNITY)
Admission: RE | Disposition: A | Discharge status: Home / Self Care | Payer: Self-pay | Source: Ambulatory Visit | Attending: Cardiovascular Disease

## 2022-11-05 ENCOUNTER — Ambulatory Visit (HOSPITAL_COMMUNITY): Payer: Medicare Other | Admitting: Anesthesiology

## 2022-11-05 ENCOUNTER — Ambulatory Visit (HOSPITAL_BASED_OUTPATIENT_CLINIC_OR_DEPARTMENT_OTHER): Payer: Medicare Other | Admitting: Anesthesiology

## 2022-11-05 ENCOUNTER — Other Ambulatory Visit: Payer: Self-pay

## 2022-11-05 ENCOUNTER — Ambulatory Visit (HOSPITAL_COMMUNITY)
Admission: RE | Admit: 2022-11-05 | Discharge: 2022-11-05 | Disposition: A | Discharge status: Home / Self Care | Payer: Medicare Other | Source: Ambulatory Visit | Attending: Cardiovascular Disease | Admitting: Cardiovascular Disease

## 2022-11-05 ENCOUNTER — Other Ambulatory Visit (HOSPITAL_COMMUNITY): Payer: Self-pay

## 2022-11-05 DIAGNOSIS — I480 Paroxysmal atrial fibrillation: Secondary | ICD-10-CM

## 2022-11-05 DIAGNOSIS — I4819 Other persistent atrial fibrillation: Secondary | ICD-10-CM | POA: Diagnosis present

## 2022-11-05 DIAGNOSIS — I4892 Unspecified atrial flutter: Secondary | ICD-10-CM | POA: Diagnosis not present

## 2022-11-05 DIAGNOSIS — I5022 Chronic systolic (congestive) heart failure: Secondary | ICD-10-CM | POA: Diagnosis not present

## 2022-11-05 DIAGNOSIS — Z7901 Long term (current) use of anticoagulants: Secondary | ICD-10-CM | POA: Insufficient documentation

## 2022-11-05 DIAGNOSIS — I214 Non-ST elevation (NSTEMI) myocardial infarction: Secondary | ICD-10-CM | POA: Diagnosis not present

## 2022-11-05 DIAGNOSIS — I484 Atypical atrial flutter: Secondary | ICD-10-CM

## 2022-11-05 DIAGNOSIS — Z79899 Other long term (current) drug therapy: Secondary | ICD-10-CM | POA: Insufficient documentation

## 2022-11-05 DIAGNOSIS — I252 Old myocardial infarction: Secondary | ICD-10-CM | POA: Insufficient documentation

## 2022-11-05 DIAGNOSIS — D6869 Other thrombophilia: Secondary | ICD-10-CM | POA: Diagnosis not present

## 2022-11-05 DIAGNOSIS — I10 Essential (primary) hypertension: Secondary | ICD-10-CM

## 2022-11-05 DIAGNOSIS — I110 Hypertensive heart disease with heart failure: Secondary | ICD-10-CM | POA: Insufficient documentation

## 2022-11-05 HISTORY — PX: ATRIAL FIBRILLATION ABLATION: EP1191

## 2022-11-05 LAB — POCT ACTIVATED CLOTTING TIME
Activated Clotting Time: 354 seconds
Activated Clotting Time: 330 seconds

## 2022-11-05 SURGERY — ATRIAL FIBRILLATION ABLATION
Anesthesia: General

## 2022-11-05 MED ORDER — HEPARIN (PORCINE) IN NACL 1000-0.9 UT/500ML-% IV SOLN
INTRAVENOUS | Status: DC | PRN
  Administered 2022-11-05 (×4): 500 mL

## 2022-11-05 MED ORDER — PROPOFOL 10 MG/ML IV BOLUS
INTRAVENOUS | Status: DC | PRN
  Administered 2022-11-05: 150 mg via INTRAVENOUS
  Administered 2022-11-05: 50 mg via INTRAVENOUS

## 2022-11-05 MED ORDER — LIDOCAINE 2% (20 MG/ML) 5 ML SYRINGE
INTRAMUSCULAR | Status: DC | PRN
  Administered 2022-11-05: 60 mg via INTRAVENOUS

## 2022-11-05 MED ORDER — SODIUM CHLORIDE 0.9% FLUSH: 3.0000 mL | INTRAVENOUS | Status: DC | PRN

## 2022-11-05 MED ORDER — FENTANYL CITRATE (PF) 250 MCG/5ML IJ SOLN
INTRAMUSCULAR | Status: DC | PRN
  Administered 2022-11-05: 100 ug via INTRAVENOUS

## 2022-11-05 MED ORDER — OXYCODONE HCL 5 MG/5ML PO SOLN: 5.0000 mg | Freq: Once | ORAL | Status: DC | PRN

## 2022-11-05 MED ORDER — DEXAMETHASONE SODIUM PHOSPHATE 10 MG/ML IJ SOLN
INTRAMUSCULAR | Status: DC | PRN
  Administered 2022-11-05: 10 mg via INTRAVENOUS

## 2022-11-05 MED ORDER — SODIUM CHLORIDE 0.9 % IV SOLN: 250.0000 mL | INTRAVENOUS | Status: DC | PRN

## 2022-11-05 MED ORDER — SODIUM CHLORIDE 0.9% FLUSH: 3.0000 mL | Freq: Two times a day (BID) | INTRAVENOUS | Status: DC

## 2022-11-05 MED ORDER — ACETAMINOPHEN 325 MG PO TABS: 650.0000 mg | ORAL_TABLET | ORAL | Status: DC | PRN

## 2022-11-05 MED ORDER — FENTANYL CITRATE (PF) 100 MCG/2ML IJ SOLN: 25.0000 ug | INTRAMUSCULAR | Status: DC | PRN

## 2022-11-05 MED ORDER — PHENYLEPHRINE HCL-NACL 20-0.9 MG/250ML-% IV SOLN
INTRAVENOUS | Status: DC | PRN
  Administered 2022-11-05: 15 ug/min via INTRAVENOUS

## 2022-11-05 MED ORDER — PHENYLEPHRINE 80 MCG/ML (10ML) SYRINGE FOR IV PUSH (FOR BLOOD PRESSURE SUPPORT)
PREFILLED_SYRINGE | INTRAVENOUS | Status: DC | PRN
  Administered 2022-11-05: 80 ug via INTRAVENOUS

## 2022-11-05 MED ORDER — SODIUM CHLORIDE 0.9 % IV SOLN: INTRAVENOUS | Status: DC

## 2022-11-05 MED ORDER — ONDANSETRON HCL 4 MG/2ML IJ SOLN: 4.0000 mg | Freq: Four times a day (QID) | INTRAMUSCULAR | Status: DC | PRN

## 2022-11-05 MED ORDER — DOBUTAMINE INFUSION FOR EP/ECHO/NUC (1000 MCG/ML)
INTRAVENOUS | Status: DC | PRN
  Administered 2022-11-05: 20 ug/kg/min via INTRAVENOUS

## 2022-11-05 MED ORDER — HEPARIN SODIUM (PORCINE) 1000 UNIT/ML IJ SOLN
INTRAMUSCULAR | Status: DC | PRN
  Administered 2022-11-05: 1000 [IU] via INTRAVENOUS

## 2022-11-05 MED ORDER — PROTAMINE SULFATE 10 MG/ML IV SOLN
INTRAVENOUS | Status: DC | PRN
  Administered 2022-11-05: 10 mg via INTRAVENOUS
  Administered 2022-11-05 (×2): 20 mg via INTRAVENOUS

## 2022-11-05 MED ORDER — COLCHICINE 0.6 MG PO TABS
0.6000 mg | ORAL_TABLET | Freq: Two times a day (BID) | ORAL | 0 refills | Status: DC
  Filled 2022-11-05: qty 10, 5d supply, fill #0

## 2022-11-05 MED ORDER — ROCURONIUM BROMIDE 10 MG/ML (PF) SYRINGE
PREFILLED_SYRINGE | INTRAVENOUS | Status: DC | PRN
  Administered 2022-11-05 (×2): 50 mg via INTRAVENOUS

## 2022-11-05 MED ORDER — SUGAMMADEX SODIUM 200 MG/2ML IV SOLN
INTRAVENOUS | Status: DC | PRN
  Administered 2022-11-05: 200 mg via INTRAVENOUS

## 2022-11-05 MED ORDER — HEPARIN SODIUM (PORCINE) 1000 UNIT/ML IJ SOLN
INTRAMUSCULAR | Status: DC | PRN
  Administered 2022-11-05: 16000 [IU] via INTRAVENOUS

## 2022-11-05 MED ORDER — OXYCODONE HCL 5 MG PO TABS: 5.0000 mg | ORAL_TABLET | Freq: Once | ORAL | Status: DC | PRN

## 2022-11-05 MED ORDER — HEPARIN SODIUM (PORCINE) 1000 UNIT/ML IJ SOLN
INTRAMUSCULAR | Status: AC
  Filled 2022-11-05: qty 10

## 2022-11-05 MED ORDER — DOBUTAMINE INFUSION FOR EP/ECHO/NUC (1000 MCG/ML)
INTRAVENOUS | Status: AC
  Filled 2022-11-05: qty 250

## 2022-11-05 MED ORDER — ONDANSETRON HCL 4 MG/2ML IJ SOLN
INTRAMUSCULAR | Status: DC | PRN
  Administered 2022-11-05: 4 mg via INTRAVENOUS

## 2022-11-05 SURGICAL SUPPLY — 20 items
BAG SNAP BAND KOVER 36X36 (MISCELLANEOUS) IMPLANT
BLANKET WARM UNDERBOD FULL ACC (MISCELLANEOUS) ×2 IMPLANT
CATH ABLAT QDOT MICRO BI TC FJ (CATHETERS) IMPLANT
CATH OCTARAY 2.0 F 3-3-3-3-3 (CATHETERS) IMPLANT
CATH PIGTAIL STEERABLE D1 8.7 (WIRE) IMPLANT
CATH S-M CIRCA TEMP PROBE (CATHETERS) IMPLANT
CATH SOUNDSTAR ECO 8FR (CATHETERS) IMPLANT
CATH WEBSTER BI DIR CS D-F CRV (CATHETERS) IMPLANT
CLOSURE PERCLOSE PROSTYLE (VASCULAR PRODUCTS) IMPLANT
COVER SWIFTLINK CONNECTOR (BAG) ×2 IMPLANT
DEVICE CLOSURE MYNXGRIP 6/7F (Vascular Products) IMPLANT
PACK EP LATEX FREE (CUSTOM PROCEDURE TRAY) ×1
PACK EP LF (CUSTOM PROCEDURE TRAY) ×2 IMPLANT
PAD DEFIB RADIO PHYSIO CONN (PAD) ×2 IMPLANT
PATCH CARTO3 (PAD) IMPLANT
SHEATH CARTO VIZIGO MED CURVE (SHEATH) IMPLANT
SHEATH PINNACLE 8F 10CM (SHEATH) IMPLANT
SHEATH PINNACLE 9F 10CM (SHEATH) IMPLANT
SHEATH PROBE COVER 6X72 (BAG) IMPLANT
TUBING SMART ABLATE COOLFLOW (TUBING) IMPLANT

## 2022-11-05 NOTE — Anesthesia Procedure Notes (Signed)
Procedure Name: Intubation Date/Time: 11/05/2022 1:12 PM  Performed by: Quentin Ore, CRNAPre-anesthesia Checklist: Patient identified, Emergency Drugs available, Suction available and Patient being monitored Patient Re-evaluated:Patient Re-evaluated prior to induction Oxygen Delivery Method: Circle system utilized Preoxygenation: Pre-oxygenation with 100% oxygen Induction Type: IV induction Ventilation: Mask ventilation without difficulty Laryngoscope Size: Mac and 3 Grade View: Grade II Tube type: Oral Tube size: 7.0 mm Number of attempts: 1 Airway Equipment and Method: Stylet Placement Confirmation: ETT inserted through vocal cords under direct vision, positive ETCO2 and breath sounds checked- equal and bilateral Secured at: 21 cm Tube secured with: Tape Dental Injury: Teeth and Oropharynx as per pre-operative assessment

## 2022-11-05 NOTE — Discharge Instructions (Signed)

## 2022-11-05 NOTE — Anesthesia Preprocedure Evaluation (Signed)
Anesthesia Evaluation  Patient identified by MRN, date of birth, ID band Patient awake    Reviewed: Allergy & Precautions, H&P , NPO status , Patient's Chart, lab work & pertinent test results  Airway Mallampati: II   Neck ROM: full    Dental   Pulmonary neg pulmonary ROS   breath sounds clear to auscultation       Cardiovascular hypertension, + dysrhythmias Atrial Fibrillation  Rhythm:regular Rate:Normal     Neuro/Psych CVA    GI/Hepatic ,GERD  ,,  Endo/Other    Renal/GU      Musculoskeletal   Abdominal   Peds  Hematology   Anesthesia Other Findings   Reproductive/Obstetrics                             Anesthesia Physical Anesthesia Plan  ASA: 3  Anesthesia Plan: General   Post-op Pain Management:    Induction: Intravenous  PONV Risk Score and Plan: 3 and Ondansetron, Dexamethasone, Midazolam and Treatment may vary due to age or medical condition  Airway Management Planned: Oral ETT  Additional Equipment:   Intra-op Plan:   Post-operative Plan: Extubation in OR  Informed Consent: I have reviewed the patients History and Physical, chart, labs and discussed the procedure including the risks, benefits and alternatives for the proposed anesthesia with the patient or authorized representative who has indicated his/her understanding and acceptance.     Dental advisory given  Plan Discussed with: CRNA, Anesthesiologist and Surgeon  Anesthesia Plan Comments:        Anesthesia Quick Evaluation

## 2022-11-05 NOTE — H&P (Signed)
Electrophysiology Office Note:    Date:  11/05/2022   ID:  Renee Benitez, DOB 1950-07-04, MRN [id number]  PCP:  Aliene Beams, MD   [facility] Providers Cardiologist:  Dina Rich, MD Electrophysiologist:  Lewayne Bunting, MD     Referring MD: No ref. provider found   History of Present Illness:    Renee Benitez is a 72 y.o. female with a hx listed below, significant for atrial fibrillation and flutter, Takotsubo cardiomyopathy with recovered EF referred for arrhythmia management.  She has a history of both typical and atypical flutter, both of which were successfully ablated. She has done well on flecainide, but this was discontinued in February when she was admitted for NSTEMI.  She discussed management options with her primary EP, Dr. Ladona Ridgel and she expressed a preference for ablation rather than an alternative medication.  I reviewed the patient's CT and labs. There was no LAA thrombus. she  has not missed any doses of anticoagulation, and she took her dose last night. There have been no changes in the patient's diagnoses, medications, or condition since our recent clinic visit.   Past Medical History:  Diagnosis Date   Atrial fibrillation [facility])    Current use of estrogen therapy 01/31/2013   Dysrhythmia    AFib   Hypertension    Macular degeneration    Menopausal syndrome    Overactive bladder    Right ovarian cyst 01/25/2014    Past Surgical History:  Procedure Laterality Date   A-FLUTTER ABLATION N/A 03/18/2021   Procedure: A-FLUTTER ABLATION;  Surgeon: [name], MD;  Location: [facility] INVASIVE CV LAB;  Service: Cardiovascular;  Laterality: N/A;   ABDOMINAL HYSTERECTOMY     APPENDECTOMY     CARDIOVERSION N/A 09/14/2016   Procedure: CARDIOVERSION;  Surgeon: [name], MD;  Location: [facility] ORS;  Service: Cardiovascular;  Laterality: N/A;   CARDIOVERSION N/A 02/09/2021   Procedure: CARDIOVERSION;  Surgeon: [name], MD;  Location: [facility]  ENDOSCOPY;  Service: Cardiovascular;  Laterality: N/A;   CATARACT EXTRACTION W/ INTRAOCULAR LENS  IMPLANT, BILATERAL Bilateral    COLONOSCOPY  12/04/2003   Friable anal canal otherwise normal rectum and colon   COLONOSCOPY N/A 03/19/2013   Dr. [name]: diverticulosis, hemorrhoids. next TCS 10 years   COLONOSCOPY N/A 05/02/2019   entire colon normal, non-bleeding external and internal hemorrhoids, grade 3. 5 year screening due to family history.   ESOPHAGOGASTRODUODENOSCOPY N/A 10/17/2018   moderately severe erosive esophagitis, esophagus status post dilation, small hiatal hernia, otherwise normal   IR ANGIO INTRA EXTRACRAN SEL INTERNAL CAROTID BILAT MOD SED  06/16/2021   IR ANGIO VERTEBRAL SEL VERTEBRAL UNI R MOD SED  06/16/2021   IR CT HEAD LTD  01/05/2021   IR CT HEAD LTD  01/05/2021   IR INTRA CRAN STENT  01/05/2021   IR PERCUTANEOUS ART THROMBECTOMY/INFUSION INTRACRANIAL INC DIAG ANGIO  01/05/2021   IR US GUIDE VASC ACCESS LEFT  01/05/2021   IR US GUIDE VASC ACCESS RIGHT  06/16/2021   MALONEY DILATION N/A 10/17/2018   Procedure: Elease Hashimoto DILATION;  Surgeon: [name], MD;  Location: [facility] ENDO SUITE;  Service: Endoscopy;  Laterality: N/A;   RADIOLOGY WITH ANESTHESIA N/A 01/05/2021   Procedure: IR WITH ANESTHESIA;  Surgeon: Radiologist, Medication, MD;  Location: [facility] OR;  Service: Radiology;  Laterality: N/A;   RIGHT/LEFT HEART CATH AND CORONARY ANGIOGRAPHY N/A 05/20/2022   Procedure: RIGHT/LEFT HEART CATH AND CORONARY ANGIOGRAPHY;  Surgeon: Runell Gess, MD;  Location:  [facility] INVASIVE CV LAB;  Service: Cardiovascular;  Laterality: N/A;   TEE WITHOUT CARDIOVERSION N/A 09/14/2016   Procedure: TRANSESOPHAGEAL ECHOCARDIOGRAM (TEE) WITH PROPOFOL;  Surgeon: [name], MD;  Location: [facility] ORS;  Service: Cardiovascular;  Laterality: N/A;   TONSILECTOMY, ADENOIDECTOMY, BILATERAL MYRINGOTOMY AND TUBES      Current Medications: Current Meds  Medication Sig   acetaminophen (TYLENOL) 500  MG tablet Take 500-1,000 mg by mouth every 6 (six) hours as needed for moderate pain or headache.   apixaban (ELIQUIS) 5 MG TABS tablet TAKE ONE TABLET (5MG  TOTAL) BY MOUTH TWOTIMES DAILY   aspirin 81 MG EC tablet Take 1 tablet (81 mg total) by mouth daily.   Calcium Carb-Cholecalciferol (CALCIUM 600 + D PO) Take 600 mg by mouth at bedtime.   Cholecalciferol (DIALYVITE VITAMIN D 5000) 125 MCG (5000 UT) capsule Take 5,000 Units by mouth at bedtime.   empagliflozin (JARDIANCE) 10 MG TABS tablet Take 1 tablet (10 mg total) by mouth daily before breakfast.   escitalopram (LEXAPRO) 20 MG tablet Take 20 mg by mouth daily.   famotidine (PEPCID) 20 MG tablet Take 20 mg by mouth daily as needed for heartburn or indigestion.   levothyroxine (SYNTHROID) 100 MCG tablet Take 100 mcg by mouth every morning.   loratadine (CLARITIN) 10 MG tablet Take 10 mg by mouth daily as needed for allergies.   Magnesium 400 MG CAPS Take 400 mg by mouth at bedtime. magnesium citrate   Metamucil Fiber CHEW Chew 1 tablet by mouth daily as needed (Constipation).   metoprolol succinate (TOPROL-XL) 100 MG 24 hr tablet Take 1 tablet (100 mg total) by mouth daily. Take with or immediately following a meal.   Multiple Vitamins-Minerals (MULTIVITAMIN WITH MINERALS) tablet Take 1 tablet by mouth in the morning. Women's One A Day   Multiple Vitamins-Minerals (PRESERVISION AREDS 2) CAPS Take 1 capsule by mouth 2 (two) times daily.   Omega-3 Fatty Acids (FISH OIL ULTRA) 1400 MG CAPS Take 1,400 mg by mouth in the morning.   pantoprazole (PROTONIX) 40 MG tablet TAKE ONE TABLET (40MG  TOTAL) BY MOUTH DAILY   rosuvastatin (CRESTOR) 20 MG tablet Take 20 mg by mouth in the morning.   sacubitril-valsartan (ENTRESTO) 24-26 MG Take 1 tablet by mouth 2 (two) times daily.   simethicone (MYLICON) 80 MG chewable tablet Chew 80 mg by mouth every 6 (six) hours as needed for flatulence.   solifenacin (VESICARE) 5 MG tablet Take 5 mg by mouth daily.      Allergies:   Patient has no known allergies.   Social and Family History: Reviewed in Epic  ROS:   Please see the history of present illness.    All other systems reviewed and are negative.  EKGs/Labs/Other Studies Reviewed Today:    Echocardiogram:  TTE 05/21/2022  1. Anteroseptal wall is akinetic except very small area at the base. The  mid to distal inferoseptum, anterolateral, anterior walls are akinetic.  Apex is akinetic. If coronary disease excluded pattern would fit Takotsubo  cardiomyopathy. . Left  ventricular ejection fraction, by estimation, is 30 to 35%. The left  ventricle has moderately decreased function. The left ventricle  demonstrates regional wall motion abnormalities (see scoring  diagram/findings for description). There is mild left  ventricular hypertrophy. Left ventricular diastolic parameters are  consistent with Grade II diastolic dysfunction (pseudonormalization).  Elevated left atrial pressure.   2. Right ventricular systolic function is normal. The right ventricular  size is normal. There is mildly elevated pulmonary artery  systolic  pressure.   3. Left atrial size was mildly dilated.   4. The mitral valve is abnormal. Mild mitral valve regurgitation.   5. The tricuspid valve is abnormal.   6. The aortic valve is tricuspid. Aortic valve regurgitation is mild. No  aortic stenosis is present.   7. The inferior vena cava is normal in size with greater than 50%  respiratory variability, suggesting right atrial pressure of 3 mmHg.   TTE 06/18/2022 1. Left ventricular ejection fraction, by estimation, is 65 to 70% . The left ventricle has normal function. The left ventricle has no regional wall motion abnormalities. Left ventricular diastolic function could not be evaluated. 2. The inferior vena cava is dilated in size with > 50% respiratory variability, suggesting right atrial pressure of 8 mmHg.  Monitors:   Stress testing:   Advanced  imaging:    EKG:  Last EKG results: today -- atrial fibrillation with controlled rates   Recent Labs: 05/18/2022: ALT 25 05/19/2022: TSH 2.971 05/20/2022: Magnesium 2.1 06/01/2022: B Natriuretic Peptide 829.1 10/15/2022: BUN 21; Creatinine, Ser 0.88; Hemoglobin 15.5; Platelets 230; Potassium 4.0; Sodium 137     Physical Exam:    VS:  BP 127/81   Pulse 88   Temp 98.4 F (36.9 C)   Resp 19   Ht 5' 6.5" (1.689 m)   Wt 88.5 kg   SpO2 97%   BMI 31.00 kg/m     Wt Readings from Last 3 Encounters:  11/05/22 88.5 kg  08/11/22 86.5 kg  07/20/22 87 kg     GEN: Well nourished, well developed in no acute distress CARDIAC: iRRR, no murmurs, rubs, gallops RESPIRATORY:  Normal work of breathing MUSCULOSKELETAL: no edema    ASSESSMENT & PLAN:    Persistent atrial fibrillation Symptomatic with fatigue, senses an irregular heart rhythm We reviewed management options. She would prefer rhythm control due to her symptoms and would rather have a procedure than medication.  We discussed the indication, rationale, logistics, anticipated benefits, and potential risks of the ablation procedure including but not limited to -- bleed at the groin access site, chest pain, damage to nearby organs such as the diaphragm, lungs, or esophagus, need for a drainage tube, or prolonged hospitalization. I explained that the risk for stroke, heart attack, need for open chest surgery, or even death is very low but not zero. she  expressed understanding and wishes to proceed.   History of typical and atypical atrial flutters No evidence of recurrence after ablation  Secondary hypercoagulable state Continue Apixaban 5mg   CHFrEF EF 30-35% on 05/19/2022 recovered on 06/18/22 TTE        Medication Adjustments/Labs and Tests Ordered: Current medicines are reviewed at length with the patient today.  Concerns regarding medicines are outlined above.  Orders Placed This Encounter  Procedures   Informed Consent  Details: Physician/Practitioner Attestation; Transcribe to consent form and obtain patient signature   Initiate Pre-op Protocol   Void on call to EP Lab   Confirm CBC and BMP (or CMP) results within 7 days for inpatient and 30 days for outpatient:   Clip right and left femoral area PM before surgery   Clip right internal jugular area PM before surgery   Pre-admission testing diagnosis   EP STUDY   Insert peripheral IV   Meds ordered this encounter  Medications   0.9 %  sodium chloride infusion     Signed, Maurice Small, MD  11/05/2022 12:37 PM    [facility]

## 2022-11-05 NOTE — Transfer of Care (Signed)
Immediate Anesthesia Transfer of Care Note  Patient: Renee Benitez  Procedure(s) Performed: ATRIAL FIBRILLATION ABLATION  Patient Location: Cath Lab  Anesthesia Type:General  Level of Consciousness: awake, alert , and oriented  Airway & Oxygen Therapy: Patient Spontanous Breathing and Patient connected to nasal cannula oxygen  Post-op Assessment: Report given to RN and Post -op Vital signs reviewed and stable  Post vital signs: Reviewed and stable  Last Vitals:  Vitals Value Taken Time  BP 119/58   Temp    Pulse 66   Resp 9   SpO2 96     Last Pain:  Vitals:   11/05/22 1054  PainSc: 0-No pain         Complications: No notable events documented.

## 2022-11-06 NOTE — Anesthesia Postprocedure Evaluation (Signed)
Anesthesia Post Note  Patient: Renee Benitez  Procedure(s) Performed: ATRIAL FIBRILLATION ABLATION     Patient location during evaluation: Cath Lab Anesthesia Type: General Level of consciousness: awake and alert Pain management: pain level controlled Vital Signs Assessment: post-procedure vital signs reviewed and stable Respiratory status: spontaneous breathing, nonlabored ventilation, respiratory function stable and patient connected to nasal cannula oxygen Cardiovascular status: blood pressure returned to baseline and stable Postop Assessment: no apparent nausea or vomiting Anesthetic complications: no   No notable events documented.  Last Vitals:  Vitals:   11/05/22 1800 11/05/22 1900  BP: 132/73 136/79  Pulse: 69 74  Resp: 17 17  Temp:    SpO2: 96% 97%    Last Pain:  Vitals:   11/05/22 1900  TempSrc:   PainSc: 0-No pain                 Karsyn Rochin S

## 2022-11-08 ENCOUNTER — Encounter (HOSPITAL_COMMUNITY): Payer: Self-pay | Admitting: Cardiovascular Disease

## 2022-11-09 ENCOUNTER — Telehealth: Payer: Self-pay | Admitting: Cardiovascular Disease

## 2022-11-09 NOTE — Telephone Encounter (Signed)
Spoke with patient, per apple watch she's back in Afib as of last night (ablation on 11/05/22). Patient states she's asymptomatic besides some fatigue today but wondering if she should she should be started on an antiarrhythmic at this point?  Will forward to Dr Nelly Laurence for his recommendation.

## 2022-11-09 NOTE — Telephone Encounter (Signed)
Patient c/o Palpitations:  High priority if patient c/o lightheadedness, shortness of breath, or chest pain  How long have you had palpitations/irregular HR/ Afib? Are you having the symptoms now? yes  Are you currently experiencing lightheadedness, SOB or CP? Tiredness   Do you have a history of afib (atrial fibrillation) or irregular heart rhythm? yes  Have you checked your BP or HR? (document readings if available): 106/64 hr 86  Are you experiencing any other symptoms?     Patient states she just had an ablation done. Please advise

## 2022-11-11 NOTE — Telephone Encounter (Signed)
Patient is following up requesting updates.

## 2022-11-11 NOTE — Telephone Encounter (Signed)
Per Dr. Nelly Laurence, Yes. Amiodarone 200 mg PO BID for 2 weeks then 200 daily for a total of 2 months.   Called patient back about Dr. Morrie Sheldon advisement. Patient verbalized understanding and will call our office if she has any issues.  Went to order amiodarone and drug to drug interaction came up with warning that with patient taking amiodarone with escitalopram -"Additive QT interval prolongation may occur during coadministration of escitalopram, a moderate-risk QT-prolonging agent, and QT-prolonging Agents (Highest Risk)."  Will see if Dr. Nelly Laurence will want to get an EKG in a week or use alternative medication.

## 2022-11-15 ENCOUNTER — Encounter: Payer: Self-pay | Admitting: Gastroenterology

## 2022-11-15 MED ORDER — AMIODARONE HCL 200 MG PO TABS: ORAL_TABLET | ORAL | 0 refills | Status: DC

## 2022-11-15 NOTE — Telephone Encounter (Signed)
Mealor, Roberts Gaudy, MD  to Me  AM   11/12/22  6:15 PM No need. We are aware of this theoretical interaction. Risk of arrhythmia with oral amiodarone is very low.  Called patient back to let her know we are sending the amiodarone in and she will keep her follow-up with A. FIB clinic.

## 2022-11-24 ENCOUNTER — Other Ambulatory Visit: Payer: Self-pay | Admitting: Cardiology

## 2022-11-29 ENCOUNTER — Encounter (HOSPITAL_COMMUNITY): Payer: Self-pay | Admitting: Internal Medicine

## 2022-11-29 ENCOUNTER — Ambulatory Visit (HOSPITAL_COMMUNITY)
Admission: RE | Admit: 2022-11-29 | Discharge: 2022-11-29 | Disposition: A | Discharge status: Home / Self Care | Payer: Medicare Other | Source: Ambulatory Visit | Attending: Internal Medicine | Admitting: Internal Medicine

## 2022-11-29 VITALS — BP 122/88 | HR 88 | Ht 66.5 in | Wt 197.0 lb

## 2022-11-29 DIAGNOSIS — I480 Paroxysmal atrial fibrillation: Secondary | ICD-10-CM | POA: Diagnosis not present

## 2022-11-29 DIAGNOSIS — E669 Obesity, unspecified: Secondary | ICD-10-CM | POA: Diagnosis not present

## 2022-11-29 DIAGNOSIS — I10 Essential (primary) hypertension: Secondary | ICD-10-CM | POA: Insufficient documentation

## 2022-11-29 DIAGNOSIS — I4892 Unspecified atrial flutter: Secondary | ICD-10-CM | POA: Insufficient documentation

## 2022-11-29 DIAGNOSIS — Z6831 Body mass index (BMI) 31.0-31.9, adult: Secondary | ICD-10-CM | POA: Insufficient documentation

## 2022-11-29 DIAGNOSIS — D6869 Other thrombophilia: Secondary | ICD-10-CM | POA: Diagnosis not present

## 2022-11-29 DIAGNOSIS — Z8673 Personal history of transient ischemic attack (TIA), and cerebral infarction without residual deficits: Secondary | ICD-10-CM | POA: Insufficient documentation

## 2022-11-29 DIAGNOSIS — R0683 Snoring: Secondary | ICD-10-CM | POA: Diagnosis not present

## 2022-11-29 DIAGNOSIS — I4819 Other persistent atrial fibrillation: Secondary | ICD-10-CM | POA: Insufficient documentation

## 2022-11-29 DIAGNOSIS — I4891 Unspecified atrial fibrillation: Secondary | ICD-10-CM | POA: Diagnosis present

## 2022-11-29 LAB — CBC
HCT: 46.8 % — ABNORMAL HIGH (ref 36.0–46.0)
Hemoglobin: 15.4 g/dL — ABNORMAL HIGH (ref 12.0–15.0)
MCH: 30.1 pg (ref 26.0–34.0)
MCHC: 32.9 g/dL (ref 30.0–36.0)
MCV: 91.6 fL (ref 80.0–100.0)
Platelets: 211 10*3/uL (ref 150–400)
RBC: 5.11 MIL/uL (ref 3.87–5.11)
RDW: 13.2 % (ref 11.5–15.5)
WBC: 10.1 10*3/uL (ref 4.0–10.5)
nRBC: 0 % (ref 0.0–0.2)

## 2022-11-29 LAB — BASIC METABOLIC PANEL
Anion gap: 12 (ref 5–15)
BUN: 14 mg/dL (ref 8–23)
CO2: 22 mmol/L (ref 22–32)
Calcium: 9.1 mg/dL (ref 8.9–10.3)
Chloride: 103 mmol/L (ref 98–111)
Creatinine, Ser: 1.07 mg/dL — ABNORMAL HIGH (ref 0.44–1.00)
GFR, Estimated: 56 mL/min — ABNORMAL LOW (ref >60)
Glucose, Bld: 111 mg/dL — ABNORMAL HIGH (ref 70–99)
Potassium: 3.9 mmol/L (ref 3.5–5.1)
Sodium: 137 mmol/L (ref 135–145)

## 2022-11-29 NOTE — Patient Instructions (Signed)
Cardioversion scheduled for: Tuesday, August 27th   - Arrive at the [facility] and go to admitting at 930am   - Do not eat or drink anything after midnight the night prior to your procedure.   - Take all your morning medication (except diabetic medications) with a sip of water prior to arrival.  - You will not be able to drive home after your procedure.    - Do NOT miss any doses of your blood thinner - if you should miss a dose please notify our office immediately.   - If you feel as if you go back into normal rhythm prior to scheduled cardioversion, please notify our office immediately.   If your procedure is canceled in the cardioversion suite you will be charged a cancellation fee.    Hold below medications 7 days prior to scheduled procedure/anesthesia.  Restart medication on the normal dosing day after scheduled procedure/anesthesia  Dulaglutide (Trulicity) Exenatide extended release (Bydureon bcise) Semaglutide (Ozempic) (WEGOVY)  Tirzepatide (Mounjaro)     Hold below medications 72 hours prior to scheduled procedure/anesthesia. Restart medication on the following day after scheduled procedure/anesthesia Bexagliflozin (Brenzavvy) Canagliflozin (Invokana) Canagliflozin/metformin (Invokamet/Invokamet XR) Dapagliflozin Marcelline Deist) Dapagliflozin/metformin (Xigduo XR) Dapagliflozin/saxagliptin Colbert Coyer) Empagliflozin (Jardiance) Empagliflozin/linagliptin (Glyxambi) Empagliflozin/linagliptin/metformin (Trijardy XR) Empagliflozin/metformin (Synjardy/Synjardy XR) Ertugliflozin (Steglatro) Ertugliflozin/metformin (Segluromet) Ertugliflozin/sitagliptin (Steglujan)    Hold below medications 24 hours prior to scheduled procedure/anesthesia.   Restart medication on the following day after scheduled procedure/anesthesia   Exenatide (Byetta)  Liraglutide (Victoza, Saxenda)  Lixisenatide (Adlyxin)  Semaglutide (Rybelsus) Polyethylene Glycol Loxenatide         For those patients who have a scheduled procedure/anesthesia on the same day of the week as their dose, hold the medication on the day of surgery.  They can take their scheduled dose the week before.  **Patients on the above medications scheduled for elective procedures that have not held the medication for the appropriate amount of time are at risk of cancellation or change in the anesthetic plan.

## 2022-11-29 NOTE — Progress Notes (Signed)
Primary Care Physician: Aliene Beams, MD Primary Cardiologist: Dr Diona Browner  Primary Electrophysiologist: Dr Ladona Ridgel Referring Physician: Nena Polio NP   Renee Benitez is a 72 y.o. female with a history of HTN, CVA, and atrial fibrillation who presents for follow up in the [facility]. Patient is on Eliquis for a CHADS2VASC score of 5. She has been maintained on flecainide and diltiazem but called in with symptoms of ongoing palpitations and SOB with exertion on 07/22/20. She admits that her symptoms had been ongoing for about 2 months. ECG 07/29/20 showed atypical atrial flutter with rapid rates. Her diltiazem was increased. She was also started on metoprolol which converted her to SR.  Patient was hospitalized with left sided weakness on 01/05/21 and was diagnosed with an acute CVA. TPA was not given because she was on Eliquis but she did have successful mechanical thrombectomy of PCA with stenting and angioplasty. She reports that she was 3 hours late taking her Eliquis that day but did not miss any doses. She has been referred by her PCP for a hypercoagulable evaluation with hematology.   On follow up today, patient is s/p atrial flutter (CTI and mitral annular) ablation with Dr Ladona Ridgel on 03/18/21. She now has an Apple watch which has shown some episodes of afib over the past two months. The episodes occur about once per week and last about 3-4 hours. She is not as symptomatic as she was prior to ablation. She denies any bleeding issues on anticoagulation.   On follow up 11/29/22, she is currently in Afib. S/p Afib ablation on 11/05/22 by Dr. Nelly Laurence. She contacted office on 7/30 noting she went back into Afib. She began amiodarone 200 mg BID x 2 weeks for a load and will then transition to 200 mg once daily. She will complete amiodarone load this Thursday. She feels fatigue when in Afib. No chest pain, SOB, or trouble swallowing. Leg sites healed without issue. No missed  doses of anticoagulant.  Today, she denies symptoms of orthopnea, PND, lower extremity edema, dizziness, presyncope, syncope, snoring, daytime somnolence, bleeding, or neurologic sequela. The patient is tolerating medications without difficulties and is otherwise without complaint today.   Atrial Fibrillation Risk Factors:  she does have symptoms or diagnosis of sleep apnea. she does not have a history of rheumatic fever.   she has a BMI of Body mass index is 31.32 kg/m.Marland Kitchen Filed Weights   11/29/22 1329  Weight: 89.4 kg    Family History  Problem Relation Age of Onset   Coronary artery disease Father    Cancer Father        bladder cancer   Arrhythmia Sister        Reportedly had ablation of SVT   Hypertension Sister    Colon cancer Sister 74       s/p surgical resection, no chemo/radiation   Colon cancer Maternal Grandmother    Hypertension Son    Atrial Fibrillation Management history:  Previous antiarrhythmic drugs: flecainide, amiodarone  Previous cardioversions: 2018, 02/09/21 Previous ablations: 03/18/21, 11/05/22 CHADS2VASC score: 5 Anticoagulation history: Eliquis   Past Medical History:  Diagnosis Date   Atrial fibrillation (HCC)    Current use of estrogen therapy 01/31/2013   Dysrhythmia    AFib   Hypertension    Macular degeneration    Menopausal syndrome    Overactive bladder    Right ovarian cyst 01/25/2014    Current Outpatient Medications  Medication Sig Dispense Refill  acetaminophen (TYLENOL) 500 MG tablet Take 500-1,000 mg by mouth every 6 (six) hours as needed for moderate pain or headache.     amiodarone (PACERONE) 200 MG tablet Take one tablet by mouth twice daily for 2 weeks, then take one tablet by mouth daily for a total of 2 months. 74 tablet 0   apixaban (ELIQUIS) 5 MG TABS tablet TAKE ONE TABLET (5MG  TOTAL) BY MOUTH TWOTIMES DAILY 180 tablet 1   aspirin 81 MG EC tablet Take 1 tablet (81 mg total) by mouth daily. 150 tablet 2   Calcium  Carb-Cholecalciferol (CALCIUM 600 + D PO) Take 600 mg by mouth at bedtime.     Cholecalciferol (DIALYVITE VITAMIN D 5000) 125 MCG (5000 UT) capsule Take 5,000 Units by mouth at bedtime.     escitalopram (LEXAPRO) 20 MG tablet Take 20 mg by mouth daily.     famotidine (PEPCID) 20 MG tablet Take 20 mg by mouth daily as needed for heartburn or indigestion.     JARDIANCE 10 MG TABS tablet TAKE ONE (1) TABLET BY MOUTH EVERY DAY BEFORE BREAKFAST 30 tablet 3   levothyroxine (SYNTHROID) 100 MCG tablet Take 100 mcg by mouth every morning.     loratadine (CLARITIN) 10 MG tablet Take 10 mg by mouth daily as needed for allergies.     Magnesium 400 MG CAPS Take 400 mg by mouth at bedtime. magnesium citrate     Metamucil Fiber CHEW Chew 1 tablet by mouth daily as needed (Constipation).     metoprolol succinate (TOPROL-XL) 100 MG 24 hr tablet Take 1 tablet (100 mg total) by mouth daily. Take with or immediately following a meal. 90 tablet 3   Multiple Vitamins-Minerals (MULTIVITAMIN WITH MINERALS) tablet Take 1 tablet by mouth in the morning. Women's One A Day     Multiple Vitamins-Minerals (PRESERVISION AREDS 2) CAPS Take 1 capsule by mouth 2 (two) times daily.     Omega-3 Fatty Acids (FISH OIL ULTRA) 1400 MG CAPS Take 1,400 mg by mouth in the morning.     pantoprazole (PROTONIX) 40 MG tablet TAKE ONE TABLET (40MG  TOTAL) BY MOUTH DAILY 90 tablet 3   rosuvastatin (CRESTOR) 20 MG tablet Take 20 mg by mouth in the morning.     sacubitril-valsartan (ENTRESTO) 24-26 MG Take 1 tablet by mouth 2 (two) times daily. 60 tablet 11   simethicone (MYLICON) 80 MG chewable tablet Chew 80 mg by mouth every 6 (six) hours as needed for flatulence.     solifenacin (VESICARE) 5 MG tablet Take 5 mg by mouth daily.     No current facility-administered medications for this encounter.    No Known Allergies  ROS- All systems are reviewed and negative except as per the HPI above.  Physical Exam: Vitals:   11/29/22 1329  BP:  122/88  Pulse: 88  Weight: 89.4 kg  Height: 5' 6.5" (1.689 m)    GEN- The patient is well appearing, alert and oriented x 3 today.   Neck - no JVD or carotid bruit noted Lungs- Clear to ausculation bilaterally, normal work of breathing Heart- Irregular rate and rhythm, no murmurs, rubs or gallops, PMI not laterally displaced Extremities- no clubbing, cyanosis, or edema Skin - no rash or ecchymosis noted   Wt Readings from Last 3 Encounters:  11/29/22 89.4 kg  11/05/22 88.5 kg  08/11/22 86.5 kg    EKG today demonstrates  Vent. rate 88 BPM PR interval * ms QRS duration 80 ms QT/QTcB 370/447 ms P-R-T axes *  33 29 Atrial fibrillation Septal infarct , age undetermined Abnormal ECG When compared with ECG of 05-Nov-2022 15:31, PREVIOUS ECG IS PRESENT  Echo 06/18/22 demonstrated   1. Left ventricular ejection fraction, by estimation, is 65 to 70%. The  left ventricle has normal function. The left ventricle has no regional  wall motion abnormalities. Left ventricular diastolic function could not  be evaluated.   2. The inferior vena cava is dilated in size with >50% respiratory  variability, suggesting right atrial pressure of 8 mmHg.   Comparison(s): Changes from prior study are noted. LVEF improved from  30-35% with RWMA on 05/19/2022 to normal LVEF with no RWMA now.  Conclusion(s)/Recommendation(s): No intracardiac source of embolism detected on this transthoracic study. A transesophageal echocardiogram is recommended to exclude cardiac source of embolism if clinically indicated.   Epic records are reviewed at length today  CHA2DS2-VASc Score = 6  The patient's score is based upon: CHF History: 0 HTN History: 1 Diabetes History: 0 Stroke History: 2 Vascular Disease History: 1 Age Score: 1 Gender Score: 1      ASSESSMENT AND PLAN: 1. Persistent Atrial Fibrillation/atrial flutter The patient's CHA2DS2-VASc score is 6, indicating a 9.7% annual risk of stroke.   S/p  atrial flutter ablation 03/18/21 S/p Afib ablation on 11/05/22 by Dr. Nelly Laurence.  She is currently in rate controlled Afib. After discussion with patient, she has agreed to proceed with DCCV. We will schedule DCCV and obtain labs today. Plan for f/u 2 weeks after DCCV.  She will complete amio load this Thursday and transition to 200 mg once daily.  Continue Toprol 100 mg daily.  Informed Consent   Shared Decision Making/Informed Consent The risks (stroke, cardiac arrhythmias rarely resulting in the need for a temporary or permanent pacemaker, skin irritation or burns and complications associated with conscious sedation including aspiration, arrhythmia, respiratory failure and death), benefits (restoration of normal sinus rhythm) and alternatives of a direct current cardioversion were explained in detail to Ms. Kothari and she agrees to proceed.    2. Secondary Hypercoagulable State (ICD10:  D68.69) The patient is at significant risk for stroke/thromboembolism based upon her CHA2DS2-VASc Score of 6.  Continue Apixaban (Eliquis).  No missed doses.   3. Obesity Body mass index is 31.32 kg/m. Lifestyle modification was discussed and encouraged including regular physical activity and weight reduction.  4. HTN Stable, no changes today.  5. Snoring/daytime somnolence  The importance of adequate treatment of sleep apnea was discussed today in order to improve our ability to maintain sinus rhythm long term. Will refer for sleep study.   Follow up 2 weeks after DCCV.    Lake Bells, PA-C Afib Clinic [facility] [address] [place], [place] [address] [phone] 11/29/2022 1:45 PM

## 2022-11-29 NOTE — H&P (View-Only) (Signed)
 Primary Care Physician: Aliene Beams, MD Primary Cardiologist: Dr Diona Browner  Primary Electrophysiologist: Dr Ladona Ridgel Referring Physician: Nena Polio NP   Renee Benitez is a 72 y.o. female with a history of HTN, CVA, and atrial fibrillation who presents for follow up in the [facility]. Patient is on Eliquis for a CHADS2VASC score of 5. She has been maintained on flecainide and diltiazem but called in with symptoms of ongoing palpitations and SOB with exertion on 07/22/20. She admits that her symptoms had been ongoing for about 2 months. ECG 07/29/20 showed atypical atrial flutter with rapid rates. Her diltiazem was increased. She was also started on metoprolol which converted her to SR.  Patient was hospitalized with left sided weakness on 01/05/21 and was diagnosed with an acute CVA. TPA was not given because she was on Eliquis but she did have successful mechanical thrombectomy of PCA with stenting and angioplasty. She reports that she was 3 hours late taking her Eliquis that day but did not miss any doses. She has been referred by her PCP for a hypercoagulable evaluation with hematology.   On follow up today, patient is s/p atrial flutter (CTI and mitral annular) ablation with Dr Ladona Ridgel on 03/18/21. She now has an Apple watch which has shown some episodes of afib over the past two months. The episodes occur about once per week and last about 3-4 hours. She is not as symptomatic as she was prior to ablation. She denies any bleeding issues on anticoagulation.   On follow up 11/29/22, she is currently in Afib. S/p Afib ablation on 11/05/22 by Dr. Nelly Laurence. She contacted office on 7/30 noting she went back into Afib. She began amiodarone 200 mg BID x 2 weeks for a load and will then transition to 200 mg once daily. She will complete amiodarone load this Thursday. She feels fatigue when in Afib. No chest pain, SOB, or trouble swallowing. Leg sites healed without issue. No missed  doses of anticoagulant.  Today, she denies symptoms of orthopnea, PND, lower extremity edema, dizziness, presyncope, syncope, snoring, daytime somnolence, bleeding, or neurologic sequela. The patient is tolerating medications without difficulties and is otherwise without complaint today.   Atrial Fibrillation Risk Factors:  she does have symptoms or diagnosis of sleep apnea. she does not have a history of rheumatic fever.   she has a BMI of Body mass index is 31.32 kg/m.Marland Kitchen Filed Weights   11/29/22 1329  Weight: 89.4 kg    Family History  Problem Relation Age of Onset   Coronary artery disease Father    Cancer Father        bladder cancer   Arrhythmia Sister        Reportedly had ablation of SVT   Hypertension Sister    Colon cancer Sister 74       s/p surgical resection, no chemo/radiation   Colon cancer Maternal Grandmother    Hypertension Son    Atrial Fibrillation Management history:  Previous antiarrhythmic drugs: flecainide, amiodarone  Previous cardioversions: 2018, 02/09/21 Previous ablations: 03/18/21, 11/05/22 CHADS2VASC score: 5 Anticoagulation history: Eliquis   Past Medical History:  Diagnosis Date   Atrial fibrillation (HCC)    Current use of estrogen therapy 01/31/2013   Dysrhythmia    AFib   Hypertension    Macular degeneration    Menopausal syndrome    Overactive bladder    Right ovarian cyst 01/25/2014    Current Outpatient Medications  Medication Sig Dispense Refill  acetaminophen (TYLENOL) 500 MG tablet Take 500-1,000 mg by mouth every 6 (six) hours as needed for moderate pain or headache.     amiodarone (PACERONE) 200 MG tablet Take one tablet by mouth twice daily for 2 weeks, then take one tablet by mouth daily for a total of 2 months. 74 tablet 0   apixaban (ELIQUIS) 5 MG TABS tablet TAKE ONE TABLET (5MG  TOTAL) BY MOUTH TWOTIMES DAILY 180 tablet 1   aspirin 81 MG EC tablet Take 1 tablet (81 mg total) by mouth daily. 150 tablet 2   Calcium  Carb-Cholecalciferol (CALCIUM 600 + D PO) Take 600 mg by mouth at bedtime.     Cholecalciferol (DIALYVITE VITAMIN D 5000) 125 MCG (5000 UT) capsule Take 5,000 Units by mouth at bedtime.     escitalopram (LEXAPRO) 20 MG tablet Take 20 mg by mouth daily.     famotidine (PEPCID) 20 MG tablet Take 20 mg by mouth daily as needed for heartburn or indigestion.     JARDIANCE 10 MG TABS tablet TAKE ONE (1) TABLET BY MOUTH EVERY DAY BEFORE BREAKFAST 30 tablet 3   levothyroxine (SYNTHROID) 100 MCG tablet Take 100 mcg by mouth every morning.     loratadine (CLARITIN) 10 MG tablet Take 10 mg by mouth daily as needed for allergies.     Magnesium 400 MG CAPS Take 400 mg by mouth at bedtime. magnesium citrate     Metamucil Fiber CHEW Chew 1 tablet by mouth daily as needed (Constipation).     metoprolol succinate (TOPROL-XL) 100 MG 24 hr tablet Take 1 tablet (100 mg total) by mouth daily. Take with or immediately following a meal. 90 tablet 3   Multiple Vitamins-Minerals (MULTIVITAMIN WITH MINERALS) tablet Take 1 tablet by mouth in the morning. Women's One A Day     Multiple Vitamins-Minerals (PRESERVISION AREDS 2) CAPS Take 1 capsule by mouth 2 (two) times daily.     Omega-3 Fatty Acids (FISH OIL ULTRA) 1400 MG CAPS Take 1,400 mg by mouth in the morning.     pantoprazole (PROTONIX) 40 MG tablet TAKE ONE TABLET (40MG  TOTAL) BY MOUTH DAILY 90 tablet 3   rosuvastatin (CRESTOR) 20 MG tablet Take 20 mg by mouth in the morning.     sacubitril-valsartan (ENTRESTO) 24-26 MG Take 1 tablet by mouth 2 (two) times daily. 60 tablet 11   simethicone (MYLICON) 80 MG chewable tablet Chew 80 mg by mouth every 6 (six) hours as needed for flatulence.     solifenacin (VESICARE) 5 MG tablet Take 5 mg by mouth daily.     No current facility-administered medications for this encounter.    No Known Allergies  ROS- All systems are reviewed and negative except as per the HPI above.  Physical Exam: Vitals:   11/29/22 1329  BP:  122/88  Pulse: 88  Weight: 89.4 kg  Height: 5' 6.5" (1.689 m)    GEN- The patient is well appearing, alert and oriented x 3 today.   Neck - no JVD or carotid bruit noted Lungs- Clear to ausculation bilaterally, normal work of breathing Heart- Irregular rate and rhythm, no murmurs, rubs or gallops, PMI not laterally displaced Extremities- no clubbing, cyanosis, or edema Skin - no rash or ecchymosis noted   Wt Readings from Last 3 Encounters:  11/29/22 89.4 kg  11/05/22 88.5 kg  08/11/22 86.5 kg    EKG today demonstrates  Vent. rate 88 BPM PR interval * ms QRS duration 80 ms QT/QTcB 370/447 ms P-R-T axes *  33 29 Atrial fibrillation Septal infarct , age undetermined Abnormal ECG When compared with ECG of 05-Nov-2022 15:31, PREVIOUS ECG IS PRESENT  Echo 06/18/22 demonstrated   1. Left ventricular ejection fraction, by estimation, is 65 to 70%. The  left ventricle has normal function. The left ventricle has no regional  wall motion abnormalities. Left ventricular diastolic function could not  be evaluated.   2. The inferior vena cava is dilated in size with >50% respiratory  variability, suggesting right atrial pressure of 8 mmHg.   Comparison(s): Changes from prior study are noted. LVEF improved from  30-35% with RWMA on 05/19/2022 to normal LVEF with no RWMA now.  Conclusion(s)/Recommendation(s): No intracardiac source of embolism detected on this transthoracic study. A transesophageal echocardiogram is recommended to exclude cardiac source of embolism if clinically indicated.   Epic records are reviewed at length today  CHA2DS2-VASc Score = 6  The patient's score is based upon: CHF History: 0 HTN History: 1 Diabetes History: 0 Stroke History: 2 Vascular Disease History: 1 Age Score: 1 Gender Score: 1      ASSESSMENT AND PLAN: 1. Persistent Atrial Fibrillation/atrial flutter The patient's CHA2DS2-VASc score is 6, indicating a 9.7% annual risk of stroke.   S/p  atrial flutter ablation 03/18/21 S/p Afib ablation on 11/05/22 by Dr. Nelly Laurence.  She is currently in rate controlled Afib. After discussion with patient, she has agreed to proceed with DCCV. We will schedule DCCV and obtain labs today. Plan for f/u 2 weeks after DCCV.  She will complete amio load this Thursday and transition to 200 mg once daily.  Continue Toprol 100 mg daily.  Informed Consent   Shared Decision Making/Informed Consent The risks (stroke, cardiac arrhythmias rarely resulting in the need for a temporary or permanent pacemaker, skin irritation or burns and complications associated with conscious sedation including aspiration, arrhythmia, respiratory failure and death), benefits (restoration of normal sinus rhythm) and alternatives of a direct current cardioversion were explained in detail to Ms. Kothari and she agrees to proceed.    2. Secondary Hypercoagulable State (ICD10:  D68.69) The patient is at significant risk for stroke/thromboembolism based upon her CHA2DS2-VASc Score of 6.  Continue Apixaban (Eliquis).  No missed doses.   3. Obesity Body mass index is 31.32 kg/m. Lifestyle modification was discussed and encouraged including regular physical activity and weight reduction.  4. HTN Stable, no changes today.  5. Snoring/daytime somnolence  The importance of adequate treatment of sleep apnea was discussed today in order to improve our ability to maintain sinus rhythm long term. Will refer for sleep study.   Follow up 2 weeks after DCCV.    Lake Bells, PA-C Afib [facility] [address] [place], [place] [address] [phone] 11/29/2022 1:45 PM

## 2022-12-03 ENCOUNTER — Ambulatory Visit (HOSPITAL_COMMUNITY): Payer: Medicare Other | Admitting: Internal Medicine

## 2022-12-06 ENCOUNTER — Ambulatory Visit (HOSPITAL_COMMUNITY)
Admission: RE | Admit: 2022-12-06 | Discharge: 2022-12-06 | Disposition: A | Discharge status: Home / Self Care | Payer: Medicare Other | Source: Ambulatory Visit | Attending: Family Medicine | Admitting: Family Medicine

## 2022-12-06 DIAGNOSIS — Z1231 Encounter for screening mammogram for malignant neoplasm of breast: Secondary | ICD-10-CM | POA: Insufficient documentation

## 2022-12-06 NOTE — Progress Notes (Signed)
 Spoke to pt and instructed them to come at 0945 and to be NPO after 0000. Confirmed no missed doses of AC and instructed to take in AM with a small sip of water.   Confirmed that pt will have a ride home and someone to stay with them for 24 hours after the procedure.

## 2022-12-07 ENCOUNTER — Ambulatory Visit (HOSPITAL_COMMUNITY): Payer: Medicare Other | Admitting: Anesthesiology

## 2022-12-07 ENCOUNTER — Ambulatory Visit (HOSPITAL_COMMUNITY)
Admission: RE | Admit: 2022-12-07 | Discharge: 2022-12-07 | Disposition: A | Discharge status: Home / Self Care | Payer: Medicare Other | Source: Ambulatory Visit | Attending: Cardiovascular Disease | Admitting: Cardiovascular Disease

## 2022-12-07 ENCOUNTER — Encounter (HOSPITAL_COMMUNITY): Payer: Self-pay | Admitting: Cardiovascular Disease

## 2022-12-07 ENCOUNTER — Encounter (HOSPITAL_COMMUNITY)
Admission: RE | Disposition: A | Discharge status: Home / Self Care | Payer: Self-pay | Source: Ambulatory Visit | Attending: Cardiovascular Disease

## 2022-12-07 ENCOUNTER — Other Ambulatory Visit: Payer: Self-pay

## 2022-12-07 DIAGNOSIS — I4892 Unspecified atrial flutter: Secondary | ICD-10-CM | POA: Insufficient documentation

## 2022-12-07 DIAGNOSIS — Z7901 Long term (current) use of anticoagulants: Secondary | ICD-10-CM | POA: Diagnosis not present

## 2022-12-07 DIAGNOSIS — E669 Obesity, unspecified: Secondary | ICD-10-CM | POA: Diagnosis not present

## 2022-12-07 DIAGNOSIS — R0683 Snoring: Secondary | ICD-10-CM | POA: Diagnosis not present

## 2022-12-07 DIAGNOSIS — Z8673 Personal history of transient ischemic attack (TIA), and cerebral infarction without residual deficits: Secondary | ICD-10-CM | POA: Diagnosis not present

## 2022-12-07 DIAGNOSIS — Z6831 Body mass index (BMI) 31.0-31.9, adult: Secondary | ICD-10-CM | POA: Insufficient documentation

## 2022-12-07 DIAGNOSIS — D6869 Other thrombophilia: Secondary | ICD-10-CM | POA: Insufficient documentation

## 2022-12-07 DIAGNOSIS — R400 Somnolence: Secondary | ICD-10-CM | POA: Diagnosis not present

## 2022-12-07 DIAGNOSIS — I4819 Other persistent atrial fibrillation: Secondary | ICD-10-CM | POA: Insufficient documentation

## 2022-12-07 DIAGNOSIS — Z79899 Other long term (current) drug therapy: Secondary | ICD-10-CM | POA: Diagnosis not present

## 2022-12-07 DIAGNOSIS — I10 Essential (primary) hypertension: Secondary | ICD-10-CM | POA: Insufficient documentation

## 2022-12-07 HISTORY — PX: CARDIOVERSION: SHX1299

## 2022-12-07 SURGERY — CARDIOVERSION
Anesthesia: General

## 2022-12-07 MED ORDER — PROPOFOL 10 MG/ML IV BOLUS
INTRAVENOUS | Status: DC | PRN
Start: 2022-12-07 — End: 2022-12-07
  Administered 2022-12-07: 20 mg via INTRAVENOUS
  Administered 2022-12-07: 10 mg via INTRAVENOUS
  Administered 2022-12-07: 40 mg via INTRAVENOUS

## 2022-12-07 MED ORDER — SODIUM CHLORIDE 0.9 % IV SOLN: INTRAVENOUS | Status: DC

## 2022-12-07 MED ORDER — SODIUM CHLORIDE 0.9 % IV SOLN: INTRAVENOUS | Status: DC | PRN

## 2022-12-07 SURGICAL SUPPLY — 1 items: ELECT DEFIB PAD ADLT CADENCE (PAD) ×2 IMPLANT

## 2022-12-07 NOTE — Anesthesia Preprocedure Evaluation (Addendum)
Anesthesia Evaluation  Patient identified by MRN, date of birth, ID band Patient awake    Reviewed: Allergy & Precautions, NPO status , Patient's Chart, lab work & pertinent test results, reviewed documented beta blocker date and time   History of Anesthesia Complications Negative for: history of anesthetic complications  Airway Mallampati: III  TM Distance: >3 FB Neck ROM: Full    Dental  (+) Teeth Intact, Dental Advisory Given   Pulmonary neg sleep apnea, neg COPD, neg recent URI   breath sounds clear to auscultation       Cardiovascular hypertension, Pt. on medications and Pt. on home beta blockers + dysrhythmias Atrial Fibrillation  Rhythm:Irregular   1. Left ventricular ejection fraction, by estimation, is 65 to 70%. The  left ventricle has normal function. The left ventricle has no regional  wall motion abnormalities. Left ventricular diastolic function could not  be evaluated.   2. The inferior vena cava is dilated in size with >50% respiratory  variability, suggesting right atrial pressure of 8 mmHg.   Comparison(s): Changes from prior study are noted. LVEF improved from  30-35% with RWMA on 05/19/2022 to normal LVEF with no RWMA now.       Prox LAD lesion is 25% stenosed.   There is severe left ventricular systolic dysfunction.   LV end diastolic pressure is moderately elevated.   The left ventricular ejection fraction is less than 25% by visual estimate.      Neuro/Psych neg Seizures CVA, No Residual Symptoms    GI/Hepatic Neg liver ROS,GERD  Medicated and Controlled,,  Endo/Other  Hypothyroidism    Renal/GU negative Renal ROS     Musculoskeletal negative musculoskeletal ROS (+)    Abdominal   Peds  Hematology  (+) Blood dyscrasia Lab Results      Component                Value               Date                      WBC                      10.1                11/29/2022                HGB                       15.4 (H)            11/29/2022                HCT                      46.8 (H)            11/29/2022                MCV                      91.6                11/29/2022                PLT                      211  11/29/2022             eliquis   Anesthesia Other Findings   Reproductive/Obstetrics                             Anesthesia Physical Anesthesia Plan  ASA: 2  Anesthesia Plan: General   Post-op Pain Management: Minimal or no pain anticipated   Induction: Intravenous  PONV Risk Score and Plan: 3 and Treatment may vary due to age or medical condition  Airway Management Planned: Nasal Cannula, Natural Airway and Simple Face Mask  Additional Equipment: None  Intra-op Plan:   Post-operative Plan:   Informed Consent: I have reviewed the patients History and Physical, chart, labs and discussed the procedure including the risks, benefits and alternatives for the proposed anesthesia with the patient or authorized representative who has indicated his/her understanding and acceptance.     Dental advisory given  Plan Discussed with: CRNA  Anesthesia Plan Comments:         Anesthesia Quick Evaluation

## 2022-12-07 NOTE — CV Procedure (Signed)
Electrical Cardioversion Procedure Note Renee Benitez [id number] March 28, 1951  Procedure: Electrical Cardioversion Indications:  Atrial Fibrillation  Procedure Details Consent: Risks of procedure as well as the alternatives and risks of each were explained to the (patient/caregiver).  Consent for procedure obtained. Time Out: Verified patient identification, verified procedure, site/side was marked, verified correct patient position, special equipment/implants available, medications/allergies/relevent history reviewed, required imaging and test results available.  Performed  Patient placed on cardiac monitor, pulse oximetry, supplemental oxygen as necessary.  Sedation given:  propofol Pacer pads placed anterior and posterior chest.  Cardioverted 1 time(s).  Cardioverted at 200J.  Evaluation Findings: Post procedure EKG shows:  sinus bradycardia Complications: None Patient did tolerate procedure well.   Chilton Si, MD 12/07/2022, 11:02 AM

## 2022-12-07 NOTE — Anesthesia Postprocedure Evaluation (Signed)
Anesthesia Post Note  Patient: Renee Benitez  Procedure(s) Performed: CARDIOVERSION     Patient location during evaluation: Cath Lab Anesthesia Type: General Level of consciousness: awake and alert Pain management: pain level controlled Vital Signs Assessment: post-procedure vital signs reviewed and stable Respiratory status: spontaneous breathing, nonlabored ventilation and respiratory function stable Cardiovascular status: blood pressure returned to baseline and stable Postop Assessment: no apparent nausea or vomiting Anesthetic complications: no   No notable events documented.  Last Vitals:  Vitals:   12/07/22 1130 12/07/22 1137  BP: 126/68   Pulse: (!) 56 (!) 56  Resp: 14 15  Temp:    SpO2: 97% 98%    Last Pain:  Vitals:   12/07/22 1137  TempSrc:   PainSc: 0-No pain                 Sariah Henkin

## 2022-12-07 NOTE — Interval H&P Note (Signed)
History and Physical Interval Note:  12/07/2022 10:53 AM  Renee Benitez  has presented today for surgery, with the diagnosis of AFIB.  The various methods of treatment have been discussed with the patient and family. After consideration of risks, benefits and other options for treatment, the patient has consented to  Procedure(s): CARDIOVERSION (N/A) as a surgical intervention.  The patient's history has been reviewed, patient examined, no change in status, stable for surgery.  I have reviewed the patient's chart and labs.  Questions were answered to the patient's satisfaction.     Chilton Si, MD

## 2022-12-07 NOTE — Transfer of Care (Signed)
Immediate Anesthesia Transfer of Care Note  Patient: Renee Benitez  Procedure(s) Performed: CARDIOVERSION  Patient Location: Cath Lab  Anesthesia Type:General  Level of Consciousness: awake and patient cooperative  Airway & Oxygen Therapy: Patient Spontanous Breathing and Patient connected to nasal cannula oxygen  Post-op Assessment: Report given to RN and Post -op Vital signs reviewed and stable  Post vital signs: Reviewed and stable  Last Vitals:  Vitals Value Taken Time  BP    Temp    Pulse    Resp    SpO2      Last Pain:  Vitals:   12/07/22 1000  TempSrc: Temporal  PainSc: 0-No pain         Complications: No notable events documented.

## 2022-12-08 ENCOUNTER — Encounter (HOSPITAL_COMMUNITY): Payer: Self-pay | Admitting: Cardiovascular Disease

## 2022-12-09 ENCOUNTER — Other Ambulatory Visit (HOSPITAL_COMMUNITY): Payer: Self-pay | Admitting: Neuroradiology

## 2022-12-09 DIAGNOSIS — I771 Stricture of artery: Secondary | ICD-10-CM

## 2022-12-16 ENCOUNTER — Ambulatory Visit (HOSPITAL_COMMUNITY)
Admission: RE | Admit: 2022-12-16 | Discharge: 2022-12-16 | Disposition: A | Discharge status: Home / Self Care | Payer: Medicare Other | Source: Ambulatory Visit | Attending: Neuroradiology | Admitting: Neuroradiology

## 2022-12-16 DIAGNOSIS — Z8673 Personal history of transient ischemic attack (TIA), and cerebral infarction without residual deficits: Secondary | ICD-10-CM | POA: Diagnosis not present

## 2022-12-16 DIAGNOSIS — I771 Stricture of artery: Secondary | ICD-10-CM | POA: Diagnosis present

## 2022-12-22 ENCOUNTER — Ambulatory Visit (HOSPITAL_COMMUNITY)
Admission: RE | Admit: 2022-12-22 | Discharge: 2022-12-22 | Disposition: A | Discharge status: Home / Self Care | Payer: Medicare Other | Source: Ambulatory Visit | Attending: Internal Medicine | Admitting: Internal Medicine

## 2022-12-22 VITALS — BP 140/64 | HR 41 | Ht 66.0 in | Wt 199.8 lb

## 2022-12-22 DIAGNOSIS — I10 Essential (primary) hypertension: Secondary | ICD-10-CM | POA: Insufficient documentation

## 2022-12-22 DIAGNOSIS — Z7901 Long term (current) use of anticoagulants: Secondary | ICD-10-CM | POA: Diagnosis not present

## 2022-12-22 DIAGNOSIS — Z7182 Exercise counseling: Secondary | ICD-10-CM | POA: Diagnosis not present

## 2022-12-22 DIAGNOSIS — E669 Obesity, unspecified: Secondary | ICD-10-CM | POA: Insufficient documentation

## 2022-12-22 DIAGNOSIS — Z6832 Body mass index (BMI) 32.0-32.9, adult: Secondary | ICD-10-CM | POA: Diagnosis not present

## 2022-12-22 DIAGNOSIS — I480 Paroxysmal atrial fibrillation: Secondary | ICD-10-CM | POA: Diagnosis not present

## 2022-12-22 DIAGNOSIS — I4892 Unspecified atrial flutter: Secondary | ICD-10-CM | POA: Diagnosis present

## 2022-12-22 DIAGNOSIS — Z79899 Other long term (current) drug therapy: Secondary | ICD-10-CM | POA: Insufficient documentation

## 2022-12-22 DIAGNOSIS — D6869 Other thrombophilia: Secondary | ICD-10-CM | POA: Diagnosis not present

## 2022-12-22 DIAGNOSIS — I4819 Other persistent atrial fibrillation: Secondary | ICD-10-CM | POA: Insufficient documentation

## 2022-12-22 MED ORDER — METOPROLOL SUCCINATE ER 25 MG PO TB24: 12.5000 mg | ORAL_TABLET | Freq: Every day | ORAL | 3 refills | Status: DC

## 2022-12-22 NOTE — Progress Notes (Signed)
Primary Care Physician: Aliene Beams, MD Primary Cardiologist: Dr Diona Browner  Primary Electrophysiologist: Dr Ladona Ridgel Referring Physician: Nena Polio NP   Renee Benitez is a 72 y.o. female with a history of HTN, CVA, and atrial fibrillation who presents for follow up in the [facility]. Patient is on Eliquis for a CHADS2VASC score of 5. She has been maintained on flecainide and diltiazem but called in with symptoms of ongoing palpitations and SOB with exertion on 07/22/20. She admits that her symptoms had been ongoing for about 2 months. ECG 07/29/20 showed atypical atrial flutter with rapid rates. Her diltiazem was increased. She was also started on metoprolol which converted her to SR.  Patient was hospitalized with left sided weakness on 01/05/21 and was diagnosed with an acute CVA. TPA was not given because she was on Eliquis but she did have successful mechanical thrombectomy of PCA with stenting and angioplasty. She reports that she was 3 hours late taking her Eliquis that day but did not miss any doses. She has been referred by her PCP for a hypercoagulable evaluation with hematology.   On follow up today, patient is s/p atrial flutter (CTI and mitral annular) ablation with Dr Ladona Ridgel on 03/18/21. She now has an Apple watch which has shown some episodes of afib over the past two months. The episodes occur about once per week and last about 3-4 hours. She is not as symptomatic as she was prior to ablation. She denies any bleeding issues on anticoagulation.   On follow up 11/29/22, she is currently in Afib. S/p Afib ablation on 11/05/22 by Dr. Nelly Laurence. She contacted office on 7/30 noting she went back into Afib. She began amiodarone 200 mg BID x 2 weeks for a load and will then transition to 200 mg once daily. She will complete amiodarone load this Thursday. She feels fatigue when in Afib. No chest pain, SOB, or trouble swallowing. Leg sites healed without issue. No missed  doses of anticoagulant.  On follow up 12/22/22, she is currently in NSR. S/p successful DCCV on 12/07/22. She has had no episodes of Afib since cardioversion. She is currently taking amiodarone 200 mg daily. No bleeding issues on Eliquis. She has noted her HR to be slow since cardioversion and has been taking Toprol 50 mg daily instead of 100 mg.   Today, she denies symptoms of orthopnea, PND, lower extremity edema, dizziness, presyncope, syncope, snoring, daytime somnolence, bleeding, or neurologic sequela. The patient is tolerating medications without difficulties and is otherwise without complaint today.   Atrial Fibrillation Risk Factors:  she does have symptoms or diagnosis of sleep apnea. she does not have a history of rheumatic fever.   she has a BMI of Body mass index is 32.25 kg/m.Marland Kitchen Filed Weights   12/22/22 1315  Weight: 90.6 kg     Family History  Problem Relation Age of Onset   Coronary artery disease Father    Cancer Father        bladder cancer   Arrhythmia Sister        Reportedly had ablation of SVT   Hypertension Sister    Colon cancer Sister 66       s/p surgical resection, no chemo/radiation   Colon cancer Maternal Grandmother    Hypertension Son    Atrial Fibrillation Management history:  Previous antiarrhythmic drugs: flecainide, amiodarone  Previous cardioversions: 2018, 02/09/21 Previous ablations: 03/18/21, 11/05/22, 12/07/22 CHADS2VASC score: 5 Anticoagulation history: Eliquis   Past Medical History:  Diagnosis Date   Atrial fibrillation [facility])    Current use of estrogen therapy 01/31/2013   Dysrhythmia    AFib   Hypertension    Macular degeneration    Menopausal syndrome    Overactive bladder    Right ovarian cyst 01/25/2014    Current Outpatient Medications  Medication Sig Dispense Refill   acetaminophen (TYLENOL) 500 MG tablet Take 500-1,000 mg by mouth as needed for moderate pain or headache.     amiodarone (PACERONE) 200 MG tablet Take  one tablet by mouth twice daily for 2 weeks, then take one tablet by mouth daily for a total of 2 months. (Patient taking differently: Taking 1 tablet by mouth daily) 74 tablet 0   apixaban (ELIQUIS) 5 MG TABS tablet TAKE ONE TABLET (5MG  TOTAL) BY MOUTH TWOTIMES DAILY 180 tablet 1   aspirin 81 MG EC tablet Take 1 tablet (81 mg total) by mouth daily. (Patient taking differently: Take 81 mg by mouth at bedtime.) 150 tablet 2   Calcium Carb-Cholecalciferol (CALCIUM 600 + D PO) Take 1 tablet by mouth at bedtime.     Cholecalciferol (DIALYVITE VITAMIN D 5000) 125 MCG (5000 UT) capsule Take 5,000 Units by mouth at bedtime.     escitalopram (LEXAPRO) 20 MG tablet Take 20 mg by mouth daily.     famotidine (PEPCID) 20 MG tablet Take 20 mg by mouth daily as needed for heartburn or indigestion.     JARDIANCE 10 MG TABS tablet TAKE ONE (1) TABLET BY MOUTH EVERY DAY BEFORE BREAKFAST 30 tablet 3   levothyroxine (SYNTHROID) 100 MCG tablet Take 100 mcg by mouth daily at 2 am.     loratadine (CLARITIN) 10 MG tablet Take 10 mg by mouth daily as needed for allergies.     Magnesium 400 MG CAPS Take 400 mg by mouth at bedtime. magnesium citrate     Metamucil Fiber CHEW Chew 1 tablet by mouth daily as needed (Constipation).     Multiple Vitamins-Minerals (MULTIVITAMIN WITH MINERALS) tablet Take 1 tablet by mouth in the morning. Women's One A Day     Multiple Vitamins-Minerals (PRESERVISION AREDS 2) CAPS Take 1 capsule by mouth 2 (two) times daily.     Omega-3 Fatty Acids (FISH OIL ULTRA) 1400 MG CAPS Take 1,400 mg by mouth in the morning.     pantoprazole (PROTONIX) 40 MG tablet TAKE ONE TABLET (40MG  TOTAL) BY MOUTH DAILY 90 tablet 3   rosuvastatin (CRESTOR) 20 MG tablet Take 20 mg by mouth in the morning.     sacubitril-valsartan (ENTRESTO) 24-26 MG Take 1 tablet by mouth 2 (two) times daily. 60 tablet 11   simethicone (MYLICON) 80 MG chewable tablet Chew 80 mg by mouth as needed for flatulence.     solifenacin  (VESICARE) 5 MG tablet Take 5 mg by mouth daily.     metoprolol succinate (TOPROL-XL) 25 MG 24 hr tablet Take 0.5 tablets (12.5 mg total) by mouth daily. Take with or immediately following a meal. 45 tablet 3   No current facility-administered medications for this encounter.    No Known Allergies  ROS- All systems are reviewed and negative except as per the HPI above.  Physical Exam: Vitals:   12/22/22 1315  BP: (!) 140/64  Pulse: (!) 41  Weight: 90.6 kg  Height: 5\' 6"  (1.676 m)    GEN- The patient is well appearing, alert and oriented x 3 today.   Neck - no JVD or carotid bruit noted Lungs- Clear to ausculation  bilaterally, normal work of breathing Heart- Regular bradycardic rate and rhythm, no murmurs, rubs or gallops, PMI not laterally displaced Extremities- no clubbing, cyanosis, or edema Skin - no rash or ecchymosis noted   Wt Readings from Last 3 Encounters:  12/22/22 90.6 kg  12/07/22 89 kg  11/29/22 89.4 kg    EKG today demonstrates  Vent. rate 41 BPM PR interval 192 ms QRS duration 82 ms QT/QTcB 508/419 ms P-R-T axes -16 59 8 Marked sinus bradycardia with Premature atrial complexes Abnormal ECG When compared with ECG of 29-Nov-2022 13:31, PREVIOUS ECG IS PRESENT  Echo 06/18/22 demonstrated   1. Left ventricular ejection fraction, by estimation, is 65 to 70%. The  left ventricle has normal function. The left ventricle has no regional  wall motion abnormalities. Left ventricular diastolic function could not  be evaluated.   2. The inferior vena cava is dilated in size with >50% respiratory  variability, suggesting right atrial pressure of 8 mmHg.   Comparison(s): Changes from prior study are noted. LVEF improved from  30-35% with RWMA on 05/19/2022 to normal LVEF with no RWMA now.  Conclusion(s)/Recommendation(s): No intracardiac source of embolism detected on this transthoracic study. A transesophageal echocardiogram is recommended to exclude cardiac source  of embolism if clinically indicated.   Epic records are reviewed at length today  CHA2DS2-VASc Score = 6  The patient's score is based upon: CHF History: 0 HTN History: 1 Diabetes History: 0 Stroke History: 2 Vascular Disease History: 1 Age Score: 1 Gender Score: 1      ASSESSMENT AND PLAN: 1. Persistent Atrial Fibrillation/atrial flutter The patient's CHA2DS2-VASc score is 6, indicating a 9.7% annual risk of stroke.   S/p atrial flutter ablation 03/18/21 S/p Afib ablation on 11/05/22 by Dr. Nelly Laurence. S/p successful DCCV on 12/07/22.  She is currently in NSR.   Qtc stable.  Continue amiodarone 200 mg once daily.  Lower Toprol to 12.5 mg daily and call us in a few days to notify HR. We can temporarily stop Toprol if needed while on amiodarone.   2. Secondary Hypercoagulable State (ICD10:  D68.69) The patient is at significant risk for stroke/thromboembolism based upon her CHA2DS2-VASc Score of 6.  Continue Apixaban (Eliquis).  No missed doses.   3. Obesity Body mass index is 32.25 kg/m. Lifestyle modification was discussed and encouraged including regular physical activity and weight reduction.  4. HTN Stable, no changes today.    Follow up as scheduled with Dr. Nelly Laurence.   Lake Bells, PA-C Afib Clinic [facility] [address] [place], [place] [address] [phone] 12/22/2022 1:45 PM

## 2022-12-22 NOTE — Patient Instructions (Addendum)
Decrease metoprolol to 12.5mg  once a day (1/2 of the 25mg  tablet) call with update of heart rates next week

## 2022-12-31 ENCOUNTER — Telehealth (HOSPITAL_COMMUNITY): Payer: Self-pay | Admitting: *Deleted

## 2022-12-31 NOTE — Telephone Encounter (Signed)
Since reduction of metoprolol to 12.5mg  daily her HRs have been mainly in the 50s to low 60 range. Her symptoms of dizziness have resolved she feels much improved. Discussed with Jorja Loa PA will continue at current dose. Pt will call if issues arise before follow up with Dr Nelly Laurence next month.

## 2023-01-03 ENCOUNTER — Telehealth (HOSPITAL_COMMUNITY): Payer: Self-pay

## 2023-01-03 NOTE — Telephone Encounter (Signed)
Pt agreed to f/u in 2 years with an MRA. AB

## 2023-01-03 NOTE — Telephone Encounter (Signed)
Called pt regarding recent imaging, no answer, left vm. AB

## 2023-01-03 NOTE — Telephone Encounter (Signed)
-----   Message from [name] sent at 01/03/2023 11:27 AM EDT ----- Gavin Pound,  Mrs. Kainz's stent is patent but appear a bit narrow. I'd recomnmend continue taking aspirin in addition to her eliquis if she is not having any bleeding issues.  Repeat MRA in 2 years.  Thanks, [employer]

## 2023-01-05 ENCOUNTER — Telehealth: Payer: Self-pay | Admitting: Cardiovascular Disease

## 2023-01-05 NOTE — Telephone Encounter (Signed)
Pt c/o medication issue:  1. Name of Medication: amiodarone (PACERONE) 200 MG tablet   2. How are you currently taking this medication (dosage and times per day)? As prescribed  3. Are you having a reaction (difficulty breathing--STAT)?   4. What is your medication issue? Pt states she was told to take medication for 2 months after her ablation. She states she got the medication on 11/15/22 and believes she started taking it around 11/16/22 or 11/17/22 and will run out around10/5/24. She would like to know if she needs to stop the medication when she runs out, or if she needs a refill to take until she sees Dr. Nelly Laurence on 02/03/23.   She does report her heart has been in rhythm since the ablation.

## 2023-01-05 NOTE — Telephone Encounter (Signed)
Pt states she was told to take medication for 2 months after her ablation. She states she got the medication on 11/15/22 and believes she started taking it around 11/16/22 or 11/17/22 and will run out around10/5/24. She would like to know if she needs to stop the medication when she runs out, or if she needs a refill to take until she sees Dr. Nelly Laurence on 02/03/23.   I advised the pt that I will forward to Dr Nelly Laurence to see how to proceed. She feels she has been in normal rhythm since the Cardioversion on Cardioversion 12/07/22.

## 2023-01-05 NOTE — Telephone Encounter (Signed)
  She can stop it when she runs out    Pt advised.

## 2023-01-19 ENCOUNTER — Telehealth (HOSPITAL_COMMUNITY): Payer: Self-pay

## 2023-01-19 NOTE — Telephone Encounter (Signed)
-----   Message from [name] sent at 01/03/2023 11:27 AM EDT ----- Gavin Pound,  Mrs. Kainz's stent is patent but appear a bit narrow. I'd recomnmend continue taking aspirin in addition to her eliquis if she is not having any bleeding issues.  Repeat MRA in 2 years.  Thanks, [employer]

## 2023-02-03 ENCOUNTER — Encounter: Payer: Self-pay | Admitting: Cardiovascular Disease

## 2023-02-03 ENCOUNTER — Ambulatory Visit: Payer: Medicare Other | Attending: Cardiovascular Disease | Admitting: Cardiovascular Disease

## 2023-02-03 VITALS — BP 120/84 | HR 48 | Ht 66.0 in | Wt 200.4 lb

## 2023-02-03 DIAGNOSIS — I4819 Other persistent atrial fibrillation: Secondary | ICD-10-CM | POA: Diagnosis not present

## 2023-02-03 DIAGNOSIS — I484 Atypical atrial flutter: Secondary | ICD-10-CM | POA: Diagnosis not present

## 2023-02-03 NOTE — Patient Instructions (Signed)
Medication Instructions:  STOP Amiodarone STOP Metoprolol  *If you need a refill on your cardiac medications before your next appointment, please call your pharmacy*   Follow-Up: At [facility], you and your health needs are our priority.  As part of our continuing mission to provide you with exceptional heart care, we have created designated Provider Care Teams.  These Care Teams include your primary Cardiologist (physician) and Advanced Practice Providers (APPs -  Physician Assistants and Nurse Practitioners) who all work together to provide you with the care you need, when you need it.  We recommend signing up for the patient portal called "MyChart".  Sign up information is provided on this After Visit Summary.  MyChart is used to connect with patients for Virtual Visits (Telemedicine).  Patients are able to view lab/test results, encounter notes, upcoming appointments, etc.  Non-urgent messages can be sent to your provider as well.   To learn more about what you can do with MyChart, go to [email or web address].    Your next appointment:   6 month(s) 1 year with Dr Nelly Laurence   Provider:   You will follow up in the Atrial Fibrillation Clinic located at [facility]. Your provider will be: Clint R. Fenton, PA-C or Lake Bells, PA-C

## 2023-02-03 NOTE — Progress Notes (Signed)
Electrophysiology Office Note:    Date:  02/03/2023   ID:  Renee Benitez, DOB Jul 09, 1950, MRN [id number]  PCP:  Aliene Beams, MD   [facility] Providers Cardiologist:  Dina Rich, MD Electrophysiologist:  Lewayne Bunting, MD     Referring MD: Aliene Beams, MD   History of Present Illness:    Renee Benitez is a 72 y.o. female with a hx listed below, significant for atrial fibrillation and flutter, Takotsubo cardiomyopathy with recovered EF referred for arrhythmia management.  She has a history of both typical and atypical flutter, both of which were successfully ablated. She has done well on flecainide, but this was discontinued in February when she was admitted for NSTEMI.  She discussed management options with her primary EP, Dr. Ladona Ridgel and she expressed a preference for ablation rather than an alternative medication.  She underwent ablation of atrial fibrillation on November 05, 2022. She had recurrence of atrial fibrillation within a month of the ablation and underwent amiodarone load and cardioversion on December 07, 2022.  She reports that she has maintained normal rhythm since cardioversion.  Bradycardia has been an issue for her, with rates sometimes as low as the 30s while at rest.  This seems to have improved since she discontinued amiodarone on October 5, when she ran out of the medication.  Her metoprolol dose has been decreased, and she is currently taking 12.5 mg every other evening.  She has not had syncope, presyncope.   EKGs/Labs/Other Studies Reviewed Today:    Echocardiogram:  TTE 05/21/2022  1. Anteroseptal wall is akinetic except very small area at the base. The  mid to distal inferoseptum, anterolateral, anterior walls are akinetic.  Apex is akinetic. If coronary disease excluded pattern would fit Takotsubo  cardiomyopathy. . Left  ventricular ejection fraction, by estimation, is 30 to 35%. The left  ventricle has moderately decreased function.  The left ventricle  demonstrates regional wall motion abnormalities (see scoring  diagram/findings for description). There is mild left  ventricular hypertrophy. Left ventricular diastolic parameters are  consistent with Grade II diastolic dysfunction (pseudonormalization).  Elevated left atrial pressure.   2. Right ventricular systolic function is normal. The right ventricular  size is normal. There is mildly elevated pulmonary artery systolic  pressure.   3. Left atrial size was mildly dilated.   4. The mitral valve is abnormal. Mild mitral valve regurgitation.   5. The tricuspid valve is abnormal.   6. The aortic valve is tricuspid. Aortic valve regurgitation is mild. No  aortic stenosis is present.   7. The inferior vena cava is normal in size with greater than 50%  respiratory variability, suggesting right atrial pressure of 3 mmHg.   TTE 06/18/2022 1. Left ventricular ejection fraction, by estimation, is 65 to 70% . The left ventricle has normal function. The left ventricle has no regional wall motion abnormalities. Left ventricular diastolic function could not be evaluated. 2. The inferior vena cava is dilated in size with > 50% respiratory variability, suggesting right atrial pressure of 8 mmHg.  Monitors:   Stress testing:   Advanced imaging:    EKG:  Last EKG results: today -- atrial fibrillation with controlled rates   Recent Labs: 05/18/2022: ALT 25 05/19/2022: TSH 2.971 05/20/2022: Magnesium 2.1 06/01/2022: B Natriuretic Peptide 829.1 11/29/2022: BUN 14; Creatinine, Ser 1.07; Hemoglobin 15.4; Platelets 211; Potassium 3.9; Sodium 137     Physical Exam:    VS:  BP 120/84 (BP Location: Left Arm, Patient Position: Sitting,  Cuff Size: Large)   Pulse (!) 48   Ht 5\' 6"  (1.676 m)   Wt 200 lb 6.4 oz (90.9 kg)   SpO2 98%   BMI 32.35 kg/m     Wt Readings from Last 3 Encounters:  02/03/23 200 lb 6.4 oz (90.9 kg)  12/22/22 199 lb 12.8 oz (90.6 kg)  12/07/22 196 lb 3.4 oz  (89 kg)     GEN: Well nourished, well developed in no acute distress CARDIAC: iRRR, no murmurs, rubs, gallops RESPIRATORY:  Normal work of breathing MUSCULOSKELETAL: no edema    ASSESSMENT & PLAN:    Persistent atrial fibrillation Symptomatic with fatigue, senses an irregular heart rhythm Maintaining sinus rhythm since recovering from the ablation Amiodarone discontinued 01/15/2023   History of typical and atypical atrial flutters No evidence of recurrence after ablation 2022  Secondary hypercoagulable state Continue Apixaban 5mg   CHFrEF EF 30-35% on 05/19/2022 recovered on 06/18/22 TTE  Sinus bradycardia Discontinue metoprolol Continue to observe as amiodarone washes out       Medication Adjustments/Labs and Tests Ordered: Current medicines are reviewed at length with the patient today.  Concerns regarding medicines are outlined above.  Orders Placed This Encounter  Procedures   EKG 12-Lead   No orders of the defined types were placed in this encounter.    Signed, Maurice Small, MD  02/03/2023 11:20 AM    [facility]

## 2023-02-25 ENCOUNTER — Ambulatory Visit: Payer: Medicare Other | Attending: Cardiology | Admitting: Cardiology

## 2023-02-25 ENCOUNTER — Encounter: Payer: Self-pay | Admitting: Family Medicine

## 2023-02-25 ENCOUNTER — Encounter: Payer: Self-pay | Admitting: Cardiology

## 2023-02-25 VITALS — BP 112/62 | HR 56 | Ht 66.5 in | Wt 199.0 lb

## 2023-02-25 DIAGNOSIS — I4891 Unspecified atrial fibrillation: Secondary | ICD-10-CM | POA: Diagnosis not present

## 2023-02-25 DIAGNOSIS — I5181 Takotsubo syndrome: Secondary | ICD-10-CM

## 2023-02-25 DIAGNOSIS — E782 Mixed hyperlipidemia: Secondary | ICD-10-CM

## 2023-02-25 DIAGNOSIS — I484 Atypical atrial flutter: Secondary | ICD-10-CM | POA: Diagnosis not present

## 2023-02-25 NOTE — Patient Instructions (Signed)
Medication Instructions:  Your physician recommends that you continue on your current medications as directed. Please refer to the Current Medication list given to you today.  *If you need a refill on your cardiac medications before your next appointment, please call your pharmacy*   Lab Work: None  If you have labs (blood work) drawn today and your tests are completely normal, you will receive your results only by: MyChart Message (if you have MyChart) OR A paper copy in the mail If you have any lab test that is abnormal or we need to change your treatment, we will call you to review the results.   Testing/Procedures: None   Follow-Up: At [facility], you and your health needs are our priority.  As part of our continuing mission to provide you with exceptional heart care, we have created designated Provider Care Teams.  These Care Teams include your primary Cardiologist (physician) and Advanced Practice Providers (APPs -  Physician Assistants and Nurse Practitioners) who all work together to provide you with the care you need, when you need it.  We recommend signing up for the patient portal called "MyChart".  Sign up information is provided on this After Visit Summary.  MyChart is used to connect with patients for Virtual Visits (Telemedicine).  Patients are able to view lab/test results, encounter notes, upcoming appointments, etc.  Non-urgent messages can be sent to your provider as well.   To learn more about what you can do with MyChart, go to [email or web address].    Your next appointment:   1 year(s)  Provider:   You may see Dina Rich, MD or one of the following Advanced Practice Providers on your designated Care Team:   Randall An, PA-C  Jacolyn Reedy, [place]     Other Instructions

## 2023-02-25 NOTE — Progress Notes (Signed)
Clinical Summary Ms. Brigance is a 72 y.o.female seen today for follow up of the following medical problems.   1.NSTEMI - admit 05/2022 with NSTEMI -05/2022 echo: LVEF 30-35%, multiple WMAs, grade II dd - 05/2022 RHC/LHC: prox LAD 25%, otherwise normal coronaries. Mean PA 31, PCWP 22, CI 2.16 - presentation overall consistent with stress induced CM/Takotsubo CM   -has been on both asa and eliquis due to her prior CVA and cerebral stenting.  - no chest pains, no SOB/DOE - compliant with meds  2. HFimpEF -05/2022 echo: LVEF 30-35%, multiple WMAs, grade II dd - 05/2022 RHC/LHC: prox LAD 25%, otherwise normal coronaries. Mean PA 31, PCWP 22, CI 2.16 - 06/2022 limited echo: LVEF 65-70% -probable stress induced CM  No SOB/DOE, no LE edema.   3.Afib - followed by EP. History of both aflutter and afib ablation - amio stopped 01/15/23 - the afib ablation was 10/2022. Had recurrence and had DCCV 11/2022. Some issues with bradycardia in SR  - no bleeding on eliquis. No palpitations   4. History of CVA - history of embolic CVA in setting of missed eliquis doses - mechanical thrombectomy and stenting  of a right P1/PCA occlusion with underlying proximal P2 segment stenosis  - recs for ASA and eliquis   5. HLD - 05/2022 TC 122 TG 73 HDL 52 LDL 55 - she is on atorvastatin   Past Medical History:  Diagnosis Date   Atrial fibrillation (HCC)    Current use of estrogen therapy 01/31/2013   Dysrhythmia    AFib   Hypertension    Macular degeneration    Menopausal syndrome    Overactive bladder    Right ovarian cyst 01/25/2014     No Known Allergies   Current Outpatient Medications  Medication Sig Dispense Refill   acetaminophen (TYLENOL) 500 MG tablet Take 500-1,000 mg by mouth as needed for moderate pain or headache.     apixaban (ELIQUIS) 5 MG TABS tablet TAKE ONE TABLET (5MG  TOTAL) BY MOUTH TWOTIMES DAILY 180 tablet 1   aspirin 81 MG EC tablet Take 1 tablet (81 mg total)  by mouth daily. (Patient taking differently: Take 81 mg by mouth at bedtime.) 150 tablet 2   Calcium Carb-Cholecalciferol (CALCIUM 600 + D PO) Take 1 tablet by mouth at bedtime.     Cholecalciferol (DIALYVITE VITAMIN D 5000) 125 MCG (5000 UT) capsule Take 5,000 Units by mouth at bedtime.     escitalopram (LEXAPRO) 20 MG tablet Take 20 mg by mouth daily.     famotidine (PEPCID) 20 MG tablet Take 20 mg by mouth daily as needed for heartburn or indigestion.     JARDIANCE 10 MG TABS tablet TAKE ONE (1) TABLET BY MOUTH EVERY DAY BEFORE BREAKFAST 30 tablet 3   levothyroxine (SYNTHROID) 100 MCG tablet Take 100 mcg by mouth daily at 2 am.     loratadine (CLARITIN) 10 MG tablet Take 10 mg by mouth daily as needed for allergies.     Magnesium 400 MG CAPS Take 400 mg by mouth at bedtime. magnesium citrate     Metamucil Fiber CHEW Chew 1 tablet by mouth daily as needed (Constipation).     Multiple Vitamins-Minerals (MULTIVITAMIN WITH MINERALS) tablet Take 1 tablet by mouth in the morning. Women's One A Day     Multiple Vitamins-Minerals (PRESERVISION AREDS 2) CAPS Take 1 capsule by mouth 2 (two) times daily.     Omega-3 Fatty Acids (FISH OIL ULTRA) 1400 MG CAPS Take  1,400 mg by mouth in the morning.     pantoprazole (PROTONIX) 40 MG tablet TAKE ONE TABLET (40MG  TOTAL) BY MOUTH DAILY 90 tablet 3   rosuvastatin (CRESTOR) 20 MG tablet Take 20 mg by mouth in the morning.     sacubitril-valsartan (ENTRESTO) 24-26 MG Take 1 tablet by mouth 2 (two) times daily. 60 tablet 11   simethicone (MYLICON) 80 MG chewable tablet Chew 80 mg by mouth as needed for flatulence.     solifenacin (VESICARE) 5 MG tablet Take 5 mg by mouth daily.     No current facility-administered medications for this visit.     Past Surgical History:  Procedure Laterality Date   A-FLUTTER ABLATION N/A 03/18/2021   Procedure: A-FLUTTER ABLATION;  Surgeon: [name], MD;  Location: [facility] INVASIVE CV LAB;  Service: Cardiovascular;   Laterality: N/A;   ABDOMINAL HYSTERECTOMY     APPENDECTOMY     ATRIAL FIBRILLATION ABLATION N/A 11/05/2022   Procedure: ATRIAL FIBRILLATION ABLATION;  Surgeon: Maurice Small, MD;  Location: [facility] INVASIVE CV LAB;  Service: Cardiovascular;  Laterality: N/A;   CARDIOVERSION N/A 09/14/2016   Procedure: CARDIOVERSION;  Surgeon: [name], MD;  Location: [facility] ORS;  Service: Cardiovascular;  Laterality: N/A;   CARDIOVERSION N/A 02/09/2021   Procedure: CARDIOVERSION;  Surgeon: [name], MD;  Location: [facility] ENDOSCOPY;  Service: Cardiovascular;  Laterality: N/A;   CARDIOVERSION N/A 12/07/2022   Procedure: CARDIOVERSION;  Surgeon: Chilton Si, MD;  Location: [facility] INVASIVE CV LAB;  Service: Cardiovascular;  Laterality: N/A;   CATARACT EXTRACTION W/ INTRAOCULAR LENS  IMPLANT, BILATERAL Bilateral    COLONOSCOPY  12/04/2003   Friable anal canal otherwise normal rectum and colon   COLONOSCOPY N/A 03/19/2013   Dr. [name]: diverticulosis, hemorrhoids. next TCS 10 years   COLONOSCOPY N/A 05/02/2019   entire colon normal, non-bleeding external and internal hemorrhoids, grade 3. 5 year screening due to family history.   ESOPHAGOGASTRODUODENOSCOPY N/A 10/17/2018   moderately severe erosive esophagitis, esophagus status post dilation, small hiatal hernia, otherwise normal   IR ANGIO INTRA EXTRACRAN SEL INTERNAL CAROTID BILAT MOD SED  06/16/2021   IR ANGIO VERTEBRAL SEL VERTEBRAL UNI R MOD SED  06/16/2021   IR CT HEAD LTD  01/05/2021   IR CT HEAD LTD  01/05/2021   IR INTRA CRAN STENT  01/05/2021   IR PERCUTANEOUS ART THROMBECTOMY/INFUSION INTRACRANIAL INC DIAG ANGIO  01/05/2021   IR US GUIDE VASC ACCESS LEFT  01/05/2021   IR US GUIDE VASC ACCESS RIGHT  06/16/2021   MALONEY DILATION N/A 10/17/2018   Procedure: Elease Hashimoto DILATION;  Surgeon: [name], MD;  Location: [facility] ENDO SUITE;  Service: Endoscopy;  Laterality: N/A;   RADIOLOGY WITH ANESTHESIA N/A 01/05/2021   Procedure: IR WITH  ANESTHESIA;  Surgeon: Radiologist, Medication, MD;  Location: [facility] OR;  Service: Radiology;  Laterality: N/A;   RIGHT/LEFT HEART CATH AND CORONARY ANGIOGRAPHY N/A 05/20/2022   Procedure: RIGHT/LEFT HEART CATH AND CORONARY ANGIOGRAPHY;  Surgeon: Runell Gess, MD;  Location: [facility] INVASIVE CV LAB;  Service: Cardiovascular;  Laterality: N/A;   TEE WITHOUT CARDIOVERSION N/A 09/14/2016   Procedure: TRANSESOPHAGEAL ECHOCARDIOGRAM (TEE) WITH PROPOFOL;  Surgeon: [name], MD;  Location: [facility] ORS;  Service: Cardiovascular;  Laterality: N/A;   TONSILECTOMY, ADENOIDECTOMY, BILATERAL MYRINGOTOMY AND TUBES       No Known Allergies    Family History  Problem Relation Age of Onset   Coronary artery disease Father    Cancer Father  bladder cancer   Arrhythmia Sister        Reportedly had ablation of SVT   Hypertension Sister    Colon cancer Sister 29       s/p surgical resection, no chemo/radiation   Colon cancer Maternal Grandmother    Hypertension Son      Social History Ms. Reckers reports that she has never smoked. She has never used smokeless tobacco. Ms. Cheesman reports that she does not currently use alcohol.   Review of Systems CONSTITUTIONAL: No weight loss, fever, chills, weakness or fatigue.  HEENT: Eyes: No visual loss, blurred vision, double vision or yellow sclerae.No hearing loss, sneezing, congestion, runny nose or sore throat.  SKIN: No rash or itching.  CARDIOVASCULAR: per hpi RESPIRATORY: No shortness of breath, cough or sputum.  GASTROINTESTINAL: No anorexia, nausea, vomiting or diarrhea. No abdominal pain or blood.  GENITOURINARY: No burning on urination, no polyuria NEUROLOGICAL: No headache, dizziness, syncope, paralysis, ataxia, numbness or tingling in the extremities. No change in bowel or bladder control.  MUSCULOSKELETAL: No muscle, back pain, joint pain or stiffness.  LYMPHATICS: No enlarged nodes. No history of splenectomy.  PSYCHIATRIC: No history  of depression or anxiety.  ENDOCRINOLOGIC: No reports of sweating, cold or heat intolerance. No polyuria or polydipsia.  Marland Kitchen   Physical Examination Today's Vitals   02/25/23 1345  BP: 112/62  Pulse: (!) 56  SpO2: 99%  Weight: 199 lb (90.3 kg)  Height: 5' 6.5" (1.689 m)   Body mass index is 31.64 kg/m.  Gen: resting comfortably, no acute distress HEENT: no scleral icterus, pupils equal round and reactive, no palptable cervical adenopathy,  CV: RRR, no m/rg, no jvd Resp: Clear to auscultation bilaterally GI: abdomen is soft, non-tender, non-distended, normal bowel sounds, no hepatosplenomegaly MSK: extremities are warm, no edema.  Skin: warm, no rash Neuro:  no focal deficits Psych: appropriate affect      Assessment and Plan  1.History of stress induced CM/Takotsubo - LVEF has recovered. No recent symptoms - continue current meds  2. HLD - at goal, continue current meds  3. Afib/aflutter - followed by EP - doing well without symptoms, continue current med - of note on eliquis with ASA per neuro given prior history of CVA with stenting         Antoine Poche, M.D.

## 2023-03-01 NOTE — Progress Notes (Unsigned)
[facility] [address] [place]. Renee Benitez [address] [phone]       OFFICE FOLLOW-UP NOTE  Renee Benitez Date of Birth:  September 23, 1950 Medical Record Number:  [id number]   Primary neurologist: Dr. Pearlean Brownie Reason for visit: Stroke follow-up  Chief Complaint  Patient presents with   Follow-up    Patient in room #3 and alone. Patient states she is well other than her heart issues.      HPI:   Update 03/02/2023 JM: Patient returns for follow-up visit after prior visit with Dr. Pearlean Brownie 1 year ago.  Stable from neurological standpoint without new stroke/TIA symptoms.  Continued short-term memory difficulties but denies any worsening. She does report occasional headaches but resolves with Tylenol.   Remains on aspirin, Eliquis and Crestor Routinely follows with cardiology for atrial fibrillation monitoring, s/p ablation 10/2022 and DCCV 11/2022 Routinely follows with PCP Dr. Tracie Harrier for stroke risk factor management, has f/u in 04/2023 for annual visit, plans on repeating lab work  Repeat MRA head 12/2022 by Dr. Quay Burow showed stable and satisfactory appearance of right PCA stent, plans on repeat imaging in 1 year       History provided for reference purposes only Update 09/23/2021 Dr. Pearlean Brownie: She returns for follow-up after last visit 6 months ago.  She states she is doing well.  She has had no recurrent stroke or TIA symptoms.  She is tolerating Eliquis t and aspirin but does bruise very easily.  She has had no bleeding.  She recently bumped her head when something fell out of her over her daughter and hit it.  She is also referred recovering from a urinary tract infection for which she is on Macrodantin.  She states she is done well from stroke standpoint and made a full recovery.  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  She did undergo lab work at last visit and LDL cholesterol was optimal at 66 mg percent on 03/15/2021.  She had  follow-up lupus anticoagulant checked which was negative on 06/26/2011.  She underwent follow-up diagnostic cerebral catheter angiogram on 06/16/2021 by Dr. Sherlon Handing which showed 60% stenosis in the right PCA stent and conservative medical management was recommended.  She continues to have mild short-term memory difficulties which are unchanged.  She actually did quite well in the office today and scored 3/3 on recall, able to name 15 animals and scored 4/4 on clock drawing.   Initial visit 03/25/2021 Dr. Myrtie Renee Benitez. Snel[place] is a pleasant 72 year old Caucasian lady seen today for initial office follow-up visit following hospital admission for stroke and September 2022.  She is accompanied by her husband.  History is obtained from them and review of electronic medical records and I personally reviewed available pertinent imaging films in PACS.  She has past medical history of atrial fibrillation on long-term anticoagulation with Eliquis, hypertension, macular degeneration who presented on 01/05/2021 to [facility] with left-sided weakness.  She had gone to a bank and when leaving the bank noticed the symptoms.  She had a 20 point hospital and was evaluated by telemetry neurologist and found to have an extra-axial of 15.  CT angiogram demonstrated right posterior cerebral artery occlusion in the P1 segment.  Patient was on Eliquis and hence did not get thrombolysis but was transferred to [facility] for emergent mechanical thrombectomy which was successfully performed but patient required rescue angioplasty stenting of the right posterior cerebral artery by Dr. Sherlon Handing.  Patient did  well she was admitted to the ICU blood pressure is tightly controlled.  2D echo showed ejection fraction greater than 75% with moderate left ventricular hypertrophy and grade 2 diastolic dysfunction.  Left atrium size was mildly dilated.  LDL cholesterol was elevated at 131 mg percent hemoglobin A1c was 5.5.  MRI scan showed  multiple small foci of acute infarcts in both hemispheres largest one being in the right basal ganglia and thalamus.  There is old left cerebellar infarct also noted with changes of small vessel disease.  Patient was discharged home and has done well.  She is finished therapies.  She has no therapy needs.  She is back to doing all activities of daily living.  She does get tired a little bit but this is from her A. fib.  She has undergone cardioversion unsuccessfully for A. fib and recently underwent ablation by Dr. Ladona Ridgel.  She is tolerating Eliquis well without bleeding and only minor bruising.  She is tolerating Crestor without muscle aches and pains.  Her blood pressure is under good control today it is 125/71.  She plans to have follow-up lab work done at upcoming primary care physician visit next month.  She also has an appointment to see Dr. Sherlon Handing in a few months time.  She has no new complaints today.  Patient admits she may have missed 1 dose of Eliquis prior to developing a stroke symptoms.     ROS:   14 system review of systems is positive for those listed in HPI and all other systems negative  PMH:  Past Medical History:  Diagnosis Date   Atrial fibrillation (HCC)    Current use of estrogen therapy 01/31/2013   Dysrhythmia    AFib   Hypertension    Macular degeneration    Menopausal syndrome    Overactive bladder    Right ovarian cyst 01/25/2014    Social History:  Social History   Socioeconomic History   Marital status: Married    Spouse name: Not on file   Number of children: Not on file   Years of education: Not on file   Highest education level: Not on file  Occupational History   Occupation: Nurse    Comment: [employer]  Tobacco Use   Smoking status: Never   Smokeless tobacco: Never   Tobacco comments:    Never smoked 11/29/22  Vaping Use   Vaping status: Never Used  Substance and Sexual Activity   Alcohol use: Not Currently    Comment: previous  occasional, now none   Drug use: No   Sexual activity: Yes    Birth control/protection: Surgical    Comment: hyst  Other Topics Concern   Not on file  Social History Narrative   Not on file   Social Determinants of Health   Financial Resource Strain: Low Risk  (05/20/2022)   Overall Financial Resource Strain (CARDIA)    Difficulty of Paying Living Expenses: Not very hard  Food Insecurity: No Food Insecurity (05/19/2022)   Hunger Vital Sign    Worried About Running Out of Food in the Last Year: Never true    Ran Out of Food in the Last Year: Never true  Transportation Needs: No Transportation Needs (05/19/2022)   PRAPARE - Administrator, Civil Service (Medical): No    Lack of Transportation (Non-Medical): No  Physical Activity: Insufficiently Active (07/16/2020)   Exercise Vital Sign    Days of Exercise per Week: 1 day  Minutes of Exercise per Session: 30 min  Stress: Stress Concern Present (07/16/2020)   [employer] of Occupational Health - Occupational Stress Questionnaire    Feeling of Stress : To some extent  Social Connections: Socially Integrated (07/16/2020)   Social Connection and Isolation Panel [NHANES]    Frequency of Communication with Friends and Family: Three times a week    Frequency of Social Gatherings with Friends and Family: Twice a week    Attends Religious Services: More than 4 times per year    Active Member of [employer] or Organizations: Yes    Attends Engineer, structural: More than 4 times per year    Marital Status: Married  Catering manager Violence: Not At Risk (05/19/2022)   Humiliation, Afraid, Rape, and Kick questionnaire    Fear of Current or Ex-Partner: No    Emotionally Abused: No    Physically Abused: No    Sexually Abused: No    Medications:   Current Outpatient Medications on File Prior to Visit  Medication Sig Dispense Refill   acetaminophen (TYLENOL) 500 MG tablet Take 500-1,000 mg by mouth as needed for moderate pain  or headache.     apixaban (ELIQUIS) 5 MG TABS tablet TAKE ONE TABLET (5MG  TOTAL) BY MOUTH TWOTIMES DAILY 180 tablet 1   aspirin 81 MG EC tablet Take 1 tablet (81 mg total) by mouth daily. (Patient taking differently: Take 81 mg by mouth at bedtime.) 150 tablet 2   Calcium Carb-Cholecalciferol (CALCIUM 600 + D PO) Take 1 tablet by mouth at bedtime.     Cholecalciferol (DIALYVITE VITAMIN D 5000) 125 MCG (5000 UT) capsule Take 5,000 Units by mouth at bedtime.     escitalopram (LEXAPRO) 20 MG tablet Take 20 mg by mouth daily.     famotidine (PEPCID) 20 MG tablet Take 20 mg by mouth daily as needed for heartburn or indigestion.     JARDIANCE 10 MG TABS tablet TAKE ONE (1) TABLET BY MOUTH EVERY DAY BEFORE BREAKFAST 30 tablet 3   levothyroxine (SYNTHROID) 100 MCG tablet Take 100 mcg by mouth daily at 2 am.     loratadine (CLARITIN) 10 MG tablet Take 10 mg by mouth daily as needed for allergies.     Magnesium 400 MG CAPS Take 400 mg by mouth at bedtime. magnesium citrate     Metamucil Fiber CHEW Chew 1 tablet by mouth daily as needed (Constipation).     Multiple Vitamins-Minerals (MULTIVITAMIN WITH MINERALS) tablet Take 1 tablet by mouth in the morning. Women's One A Day     Multiple Vitamins-Minerals (PRESERVISION AREDS 2) CAPS Take 1 capsule by mouth 2 (two) times daily.     Omega-3 Fatty Acids (FISH OIL ULTRA) 1400 MG CAPS Take 1,400 mg by mouth in the morning.     pantoprazole (PROTONIX) 40 MG tablet TAKE ONE TABLET (40MG  TOTAL) BY MOUTH DAILY 90 tablet 3   rosuvastatin (CRESTOR) 20 MG tablet Take 20 mg by mouth in the morning.     sacubitril-valsartan (ENTRESTO) 24-26 MG Take 1 tablet by mouth 2 (two) times daily. 60 tablet 11   simethicone (MYLICON) 80 MG chewable tablet Chew 80 mg by mouth as needed for flatulence.     solifenacin (VESICARE) 5 MG tablet Take 5 mg by mouth daily.     No current facility-administered medications on file prior to visit.    Allergies:  No Known  Allergies  Physical Exam Today's Vitals   03/02/23 0724  BP: (!) 135/58  Pulse: Renee Benitez)  49  Weight: 198 lb 9.6 oz (90.1 kg)  Height: 5\' 6"  (1.676 m)   Body mass index is 32.05 kg/m.  General: Pleasant middle-aged Caucasian lady, seated, in no evident distress  Neurologic Exam Mental Status: Awake and fully alert. Oriented to place and time. Recent memory mildly impaired and remote memory intact. Attention span, concentration and fund of knowledge appropriate. Mood and affect appropriate.  Cranial Nerves: Pupils equal, briskly reactive to light. Extraocular movements full without nystagmus. Visual fields full to confrontation. Hearing intact. Facial sensation intact. Face, tongue, palate moves normally and symmetrically.  Motor: Normal bulk and tone. Normal strength in all tested extremity muscles. Sensory.: intact to touch ,pinprick .position and vibratory sensation.  Coordination: Rapid alternating movements normal in all extremities. Finger-to-nose and heel-to-shin performed accurately bilaterally. Gait and Station: Arises from chair without difficulty. Stance is normal. Gait demonstrates normal stride length and balance without use of AD Reflexes: 1+ and symmetric. Toes downgoing.       ASSESSMENT/PLAN: 72 year old Caucasian lady with right basal ganglia and thalamic infarcts secondary to right PCA occlusion in September 2022 from atrial fibrillation treated with successful mechanical thrombectomy requiring rescue angioplasty and stenting of the right P1 PCA.  Vascular risk factors of atrial fibrillation s/p ablation 10/2022 and DCCV 11/2022, NSTEMI 05/2022, hypertension and hyperlipidemia.  She also has mild cognitive impairment which appears stable. Repeat MRA head 12/2022 stable appearance of right PCA stent.    Continue aspirin 81 mg daily for intracranial stent restenosis and Eliquis for atrial fibrillation and secondary stroke prevention managed/prescribed by PCP/cardiology.  Continue  close PCP follow-up for aggressive stroke risk factor management.  Continue close cardiology follow-up as advised.  Continue routine follow-up with IR with plans on repeat imaging around 12/2023.     No further recommendations from a neurological/stroke standpoint and closely monitored by PCP and cardiology.  Advised can follow-up as needed at this time but advised to call with any questions or concerns in the future.    I spent 25 minutes of face-to-face and non-face-to-face time with patient.  This included previsit chart review, lab review, study review, order entry, electronic health record documentation, patient education and discussion regarding above diagnoses and treatment plan and answered all other questions to patient's satisfaction  Ihor Austin, [facility]  [facility] [address] [place], [place] [address]  Phone [phone] Fax [phone] Note: This document was prepared with digital dictation and possible smart phrase technology. Any transcriptional errors that result from this process are unintentional.

## 2023-03-02 ENCOUNTER — Encounter: Payer: Self-pay | Admitting: Adult Health

## 2023-03-02 ENCOUNTER — Ambulatory Visit (INDEPENDENT_AMBULATORY_CARE_PROVIDER_SITE_OTHER): Payer: Medicare Other | Admitting: Adult Health

## 2023-03-02 VITALS — BP 135/58 | HR 49 | Ht 66.0 in | Wt 198.6 lb

## 2023-03-02 DIAGNOSIS — I63531 Cerebral infarction due to unspecified occlusion or stenosis of right posterior cerebral artery: Secondary | ICD-10-CM | POA: Diagnosis not present

## 2023-03-02 DIAGNOSIS — Z95820 Peripheral vascular angioplasty status with implants and grafts: Secondary | ICD-10-CM | POA: Diagnosis not present

## 2023-03-02 NOTE — Patient Instructions (Addendum)
Continue aspirin, Eliquis 5 mg twice daily and Crestor for secondary stroke prevention  Continue to follow with cardiology as scheduled  Continue to follow with Dr. Sherlon Handing as advised  Continue to follow up with PCP regarding blood pressure and cholesterol management  Maintain strict control of hypertension with blood pressure goal below 130/90, diabetes with hemoglobin A1c goal below 7.0 % and cholesterol with LDL cholesterol (bad cholesterol) goal below 70 mg/dL.   Signs of a Stroke? Follow the BEFAST method:  Balance Watch for a sudden loss of balance, trouble with coordination or vertigo Eyes Is there a sudden loss of vision in one or both eyes? Or double vision?  Face: Ask the person to smile. Does one side of the face droop or is it numb?  Arms: Ask the person to raise both arms. Does one arm drift downward? Is there weakness or numbness of a leg? Speech: Ask the person to repeat a simple phrase. Does the speech sound slurred/strange? Is the person confused ? Time: If you observe any of these signs, call 911.       Thank you for coming to see [place] at [facility]. I hope we have been able to provide you high quality care today.  You may receive a patient satisfaction survey over the next few weeks. We would appreciate your feedback and comments so that we may continue to improve ourselves and the health of our patients.

## 2023-04-01 ENCOUNTER — Other Ambulatory Visit: Payer: Self-pay | Admitting: Cardiology

## 2023-04-01 DIAGNOSIS — I4819 Other persistent atrial fibrillation: Secondary | ICD-10-CM

## 2023-04-01 NOTE — Telephone Encounter (Signed)
Eliquis 5mg  refill request received. Patient is 72 years old, weight-90.1kg, Crea-1.07 on 11/29/22, Diagnosis-Afib, and last seen by Dr. Wyline Mood on 02/25/23. Dose is appropriate based on dosing criteria. Will send in refill to requested pharmacy.

## 2023-06-06 ENCOUNTER — Other Ambulatory Visit (HOSPITAL_COMMUNITY): Payer: Self-pay | Admitting: Cardiology

## 2023-06-06 NOTE — Telephone Encounter (Signed)
 This is a [place] pt.

## 2023-06-20 ENCOUNTER — Encounter: Payer: Self-pay | Admitting: Gastroenterology

## 2023-07-13 ENCOUNTER — Encounter: Payer: Self-pay | Admitting: Gastroenterology

## 2023-07-13 ENCOUNTER — Telehealth: Payer: Self-pay | Admitting: *Deleted

## 2023-07-13 ENCOUNTER — Ambulatory Visit: Admitting: Gastroenterology

## 2023-07-13 VITALS — BP 136/74 | HR 69 | Temp 96.9°F | Ht 66.5 in | Wt 199.0 lb

## 2023-07-13 DIAGNOSIS — R1319 Other dysphagia: Secondary | ICD-10-CM | POA: Diagnosis not present

## 2023-07-13 DIAGNOSIS — K625 Hemorrhage of anus and rectum: Secondary | ICD-10-CM

## 2023-07-13 DIAGNOSIS — K219 Gastro-esophageal reflux disease without esophagitis: Secondary | ICD-10-CM

## 2023-07-13 NOTE — Telephone Encounter (Signed)
  Request for patient to stop medication prior to procedure or is needing cleareance  07/13/23  Renee Benitez 09/25/1950  What type of surgery is being performed? Colonoscopy/ Esophagogastroduodenoscopy (EGD) with esophageal dilation  When is surgery scheduled? TBD  What type of clearance is required (medical or pharmacy to hold medication or both? medication  Are there any medications that need to be held prior to surgery and how long? Eliquis x 2 days  Name of physician performing surgery?  Dr.Rourk [facility] at [employer]: [phone] Fax: [phone]  Anethesia type (none, local, MAC, general)? MAC

## 2023-07-13 NOTE — Progress Notes (Unsigned)
 Gastroenterology Office Note     Primary Care Physician:  Dorena Gander, MD  Primary Gastroenterologist: Dr. Riley Cheadle    Chief Complaint   Chief Complaint  Patient presents with   Hemorrhoids    Hemorrhoids and change in bowel habits. Stools has also changed      History of Present Illness   Renee Benitez is a delightful 73 y.o. female presenting today with a history of chronic GERD, last seen in Feb 2024. She is here due to hemorrhoids and change in bowel habits.    Notes rectal bleeding, mucus, some tenesmus. Has prolapsing tissue. Now bleeding with wiping. Difficult to clean at times. Bowels have changed. Used to have bowel movements every day, more formed. Now more mushy. Some incontinence at times.   Pantoprazole once daily. Intermittent flares despite PPI daily. Felt food sitting in esophagus after eating [place] [employer]. Solid food dysphagia. Dilations in the past.    Jan 2021 colonoscopy: normal colon, grade 3 hemorrhoids, 5 year screening due to FH colon cancer.   EGD 2020: moderate to severe erosive esophagitis, s/p dilation, small hiatal hernia.    Past Medical History:  Diagnosis Date   Atrial fibrillation [facility])    Current use of estrogen therapy 01/31/2013   Dysrhythmia    AFib   Hypertension    Macular degeneration    Menopausal syndrome    Overactive bladder    Right ovarian cyst 01/25/2014    Past Surgical History:  Procedure Laterality Date   A-FLUTTER ABLATION N/A 03/18/2021   Procedure: A-FLUTTER ABLATION;  Surgeon: Tammie Fall, MD;  [facility] INVASIVE CV LAB;  Service: Cardiovascular;  Laterality: N/A;   ABDOMINAL HYSTERECTOMY     APPENDECTOMY     ATRIAL FIBRILLATION ABLATION N/A 11/05/2022   Procedure: ATRIAL FIBRILLATION ABLATION;  Surgeon: Efraim Grange, MD;  Location: [facility] INVASIVE CV LAB;  Service: Cardiovascular;  Laterality: N/A;   CARDIOVERSION N/A 09/14/2016   Procedure: CARDIOVERSION;  Surgeon: Gerard Knight, MD;   Location: [facility] ORS;  Service: Cardiovascular;  Laterality: N/A;   CARDIOVERSION N/A 02/09/2021   Procedure: CARDIOVERSION;  Surgeon: Loyde Rule, MD;  Location: [facility] ENDOSCOPY;  Service: Cardiovascular;  Laterality: N/A;   CARDIOVERSION N/A 12/07/2022   Procedure: CARDIOVERSION;  Surgeon: Maudine Sos, MD;  Location: [facility] INVASIVE CV LAB;  Service: Cardiovascular;  Laterality: N/A;   CATARACT EXTRACTION W/ INTRAOCULAR LENS  IMPLANT, BILATERAL Bilateral    COLONOSCOPY  12/04/2003   Friable anal canal otherwise normal rectum and colon   COLONOSCOPY N/A 03/19/2013   Dr. Riley Cheadle: diverticulosis, hemorrhoids. next TCS 10 years   COLONOSCOPY N/A 05/02/2019   entire colon normal, non-bleeding external and internal hemorrhoids, grade 3. 5 year screening due to family history.   ESOPHAGOGASTRODUODENOSCOPY N/A 10/17/2018   moderately severe erosive esophagitis, esophagus status post dilation, small hiatal hernia, otherwise normal   IR ANGIO INTRA EXTRACRAN SEL INTERNAL CAROTID BILAT MOD SED  06/16/2021   IR ANGIO VERTEBRAL SEL VERTEBRAL UNI R MOD SED  06/16/2021   IR CT HEAD LTD  01/05/2021   IR CT HEAD LTD  01/05/2021   IR INTRA CRAN STENT  01/05/2021   IR PERCUTANEOUS ART THROMBECTOMY/INFUSION INTRACRANIAL INC DIAG ANGIO  01/05/2021   IR US  GUIDE VASC ACCESS LEFT  01/05/2021   IR US  GUIDE VASC ACCESS RIGHT  06/16/2021   MALONEY DILATION N/A 10/17/2018   Procedure: Londa Rival DILATION;  Surgeon: Suzette Espy, MD;  Location: [facility] ENDO SUITE;  Service: Endoscopy;  Laterality: N/A;   RADIOLOGY WITH ANESTHESIA N/A 01/05/2021   Procedure: IR WITH ANESTHESIA;  Surgeon: Radiologist, Medication, MD;  Location: [facility] OR;  Service: Radiology;  Laterality: N/A;   RIGHT/LEFT HEART CATH AND CORONARY ANGIOGRAPHY N/A 05/20/2022   Procedure: RIGHT/LEFT HEART CATH AND CORONARY ANGIOGRAPHY;  Surgeon: Avanell Leigh, MD;  Location: [facility] INVASIVE CV LAB;  Service: Cardiovascular;  Laterality: N/A;   TEE WITHOUT  CARDIOVERSION N/A 09/14/2016   Procedure: TRANSESOPHAGEAL ECHOCARDIOGRAM (TEE) WITH PROPOFOL;  Surgeon: Gerard Knight, MD;  Location: [facility] ORS;  Service: Cardiovascular;  Laterality: N/A;   TONSILECTOMY, ADENOIDECTOMY, BILATERAL MYRINGOTOMY AND TUBES      Current Outpatient Medications  Medication Sig Dispense Refill   acetaminophen (TYLENOL) 500 MG tablet Take 500-1,000 mg by mouth as needed for moderate pain or headache.     aspirin 81 MG EC tablet Take 1 tablet (81 mg total) by mouth daily. (Patient taking differently: Take 81 mg by mouth at bedtime.) 150 tablet 2   Calcium Carb-Cholecalciferol (CALCIUM 600 + D PO) Take 1 tablet by mouth at bedtime.     Cholecalciferol (DIALYVITE VITAMIN D 5000) 125 MCG (5000 UT) capsule Take 5,000 Units by mouth at bedtime.     ELIQUIS 5 MG TABS tablet TAKE ONE TABLET (5MG  TOTAL) BY MOUTH TWOTIMES DAILY 180 tablet 1   ENTRESTO 24-26 MG TAKE ONE TABLET BY MOUTH TWICE A DAY DISCONTINUE LOSARTAN 60 tablet 11   escitalopram (LEXAPRO) 20 MG tablet Take 20 mg by mouth daily.     famotidine (PEPCID) 20 MG tablet Take 20 mg by mouth daily as needed for heartburn or indigestion.     JARDIANCE 10 MG TABS tablet TAKE ONE (1) TABLET BY MOUTH EVERY DAY BEFORE BREAKFAST 30 tablet 3   levothyroxine (SYNTHROID) 100 MCG tablet Take 100 mcg by mouth daily at 2 am.     loratadine (CLARITIN) 10 MG tablet Take 10 mg by mouth daily as needed for allergies.     Magnesium 400 MG CAPS Take 400 mg by mouth at bedtime. magnesium citrate     Metamucil Fiber CHEW Chew 1 tablet by mouth daily as needed (Constipation).     Multiple Vitamins-Minerals (MULTIVITAMIN WITH MINERALS) tablet Take 1 tablet by mouth in the morning. Women's One A Day     Multiple Vitamins-Minerals (PRESERVISION AREDS 2) CAPS Take 1 capsule by mouth 2 (two) times daily.     Omega-3 Fatty Acids (FISH OIL ULTRA) 1400 MG CAPS Take 1,400 mg by mouth in the morning.     pantoprazole (PROTONIX) 40 MG tablet TAKE ONE  TABLET (40MG  TOTAL) BY MOUTH DAILY 90 tablet 3   rosuvastatin (CRESTOR) 20 MG tablet Take 20 mg by mouth in the morning.     simethicone (MYLICON) 80 MG chewable tablet Chew 80 mg by mouth as needed for flatulence.     solifenacin (VESICARE) 5 MG tablet Take 5 mg by mouth daily.     No current facility-administered medications for this visit.    Allergies as of 07/13/2023   (No Known Allergies)    Family History  Problem Relation Age of Onset   Coronary artery disease Father    Cancer Father        bladder cancer   Arrhythmia Sister        Reportedly had ablation of SVT   Hypertension Sister    Colon cancer Sister 20       s/p surgical resection, no chemo/radiation   Colon cancer Maternal Grandmother  Hypertension Son     Social History   Socioeconomic History   Marital status: Married    Spouse name: Not on file   Number of children: Not on file   Years of education: Not on file   Highest education level: Not on file  Occupational History   Occupation: Nurse    Comment: [employer]  Tobacco Use   Smoking status: Never   Smokeless tobacco: Never   Tobacco comments:    Never smoked 11/29/22  Vaping Use   Vaping status: Never Used  Substance and Sexual Activity   Alcohol use: Not Currently    Comment: previous occasional, now none   Drug use: No   Sexual activity: Yes    Birth control/protection: Surgical    Comment: hyst  Other Topics Concern   Not on file  Social History Narrative   Not on file   Social Drivers of Health   Financial Resource Strain: Low Risk  (05/20/2022)   Overall Financial Resource Strain (CARDIA)    Difficulty of Paying Living Expenses: Not very hard  Food Insecurity: No Food Insecurity (05/19/2022)   Hunger Vital Sign    Worried About Running Out of Food in the Last Year: Never true    Ran Out of Food in the Last Year: Never true  Transportation Needs: No Transportation Needs (05/19/2022)   PRAPARE - Scientist, research (physical sciences) (Medical): No    Lack of Transportation (Non-Medical): No  Physical Activity: Insufficiently Active (07/16/2020)   Exercise Vital Sign    Days of Exercise per Week: 1 day    Minutes of Exercise per Session: 30 min  Stress: Stress Concern Present (07/16/2020)   [employer] of Occupational Health - Occupational Stress Questionnaire    Feeling of Stress : To some extent  Social Connections: Socially Integrated (07/16/2020)   Social Connection and Isolation Panel [NHANES]    Frequency of Communication with Friends and Family: Three times a week    Frequency of Social Gatherings with Friends and Family: Twice a week    Attends Religious Services: More than 4 times per year    Active Member of [employer] or Organizations: Yes    Attends Engineer, structural: More than 4 times per year    Marital Status: Married  Catering manager Violence: Not At Risk (05/19/2022)   Humiliation, Afraid, Rape, and Kick questionnaire    Fear of Current or Ex-Partner: No    Emotionally Abused: No    Physically Abused: No    Sexually Abused: No     Review of Systems   Gen: Denies any fever, chills, fatigue, weight loss, lack of appetite.  CV: Denies chest pain, heart palpitations, peripheral edema, syncope.  Resp: Denies shortness of breath at rest or with exertion. Denies wheezing or cough.  GI: see HPI GU : Denies urinary incontinence , urinary frequency, urinary hesitancy MS: Denies joint pain, muscle weakness, cramps, or limitation of movement.  Derm: Denies rash, itching, dry skin Psych: Denies depression, anxiety, memory loss, and confusion Heme: see HPI   Physical Exam   BP 136/74 (BP Location: Right Arm, Patient Position: Sitting, Cuff Size: Large)   Pulse 69   Temp (!) 96.9 F (36.1 C) (Temporal)   Ht 5' 6.5" (1.689 m)   Wt 199 lb (90.3 kg)   BMI 31.64 kg/m  General:   Alert and oriented. Pleasant and cooperative. Well-nourished and well-developed.  Head:   Normocephalic and atraumatic. Eyes:  Without icterus Abdomen:  +BS, soft, non-tender and non-distended. No HSM noted. No guarding or rebound. No masses appreciated.  Rectal:  external hemorrhoids, no mass on DRE, palpable internal hemorrhoid columns  Msk:  Symmetrical without gross deformities. Normal posture. Extremities:  Without edema. Neurologic:  Alert and  oriented x4;  grossly normal neurologically. Skin:  Intact without significant lesions or rashes. Psych:  Alert and cooperative. Normal mood and affect.   Assessment   Renee Benitez is a delightful 73 y.o. female presenting today with a history of chronic GERD, last seen in Feb 2024. She is here due to hemorrhoids, rectal bleeding, and change in bowel habits.   Rectal bleeding :with prolapsing tissue, mucus, intermittent tenesmus. Bowel habits also with less productivity and some incontinence at times. DRE without obvious mass and suspect internal hemorrhoids. However, overall constellation of symptoms is concerning and needs diagnostic colonoscopy. Last colonoscopy in 2021 with Grade 3 hemorrhoids, and she has a FH colon cancer in sister.   Dysphagia: in setting of GERD, with GERD exacerbation despite pantoprazole daily. Solid food dysphagia noted with improvement in past s/p dilation (last in 2020). Will arrange EGD with dilation in future as well. She is holding off on PPI adjustment till after procedure.   She is on Eliquis with history of afib, CVA in 2022, CAD with NSTEMI Feb 2024, HFrEF. ECHO with EF 65-70% March 2024. Will reach out to cardiology regarding holding Eliquis, along with clearance.   PLAN    Obtain cardiac clearance and request to hold Eliquis 24-48 hours prior Once clear, pursue diagnostic colonoscopy and EGD with dilation by Dr. Riley Cheadle in near future. Further recommendations to follow    Delman Ferns, PhD, ANP-BC [facility]

## 2023-07-13 NOTE — Patient Instructions (Signed)
 We are arranging a colonoscopy, upper endoscopy with dilation by Dr. [name] in the near future.   You will need to hold Jardiance for 72 hours prior to the procedure.  We are asking to hold Eliquis for 24-48 hours before procedure.  Further recommendations to follow!  I enjoyed seeing you again today! I value our relationship and want to provide genuine, compassionate, and quality care. You may receive a survey regarding your visit with me, and I welcome your feedback! Thanks so much for taking the time to complete this. I look forward to seeing you again.      Gelene Mink, PhD, ANP-BC [facility]

## 2023-07-14 NOTE — Telephone Encounter (Signed)
   Name: Renee Benitez  DOB: 1951-03-30  MRN: [id number]   Primary Cardiologist: Dina Rich, MD  Chart reviewed as part of pre-operative protocol coverage.   We have been asked for guidance to hold oral anticoagulation for upcoming procedure. Per our clinical pharmacist:  Per office protocol, patient can hold Eliquis for 1 day prior to procedure.   Patient will not need bridging with Lovenox (enoxaparin) around procedure.   Due to prior stroke would prefer just 1 day hold, if GI wants 2 day hold, will recommend review with primary cardiologist.     I will route this recommendation to the requesting party via Epic fax function and remove from pre-op pool. Please call with questions.  Roe Rutherford [place]ne [place], PA 07/14/2023, 12:17 PM

## 2023-07-14 NOTE — Telephone Encounter (Signed)
 Patient with diagnosis of atrial fibrillation on Eliquis for anticoagulation.    What type of surgery is being performed? Colonoscopy/ Esophagogastroduodenoscopy (EGD) with esophageal dilation   When is surgery scheduled? TBD   CHA2DS2-VASc Score = 6   This indicates a 9.7% annual risk of stroke. The patient's score is based upon: CHF History: 0 HTN History: 1 Diabetes History: 0 Stroke History: 2 Vascular Disease History: 1 Age Score: 1 Gender Score: 1   Per chart CVA in 12/2020 on Eliquis (denied missed doses)  CrCl 70 Platelet count 211  Per office protocol, patient can hold Eliquis for 1 day prior to procedure.   Patient will not need bridging with Lovenox (enoxaparin) around procedure.  Due to prior stroke would prefer just 1 day hold, if GI wants 2 day hold, will recommend review with primary cardiologist.    **This guidance is not considered finalized until pre-operative APP has relayed final recommendations.**

## 2023-07-24 ENCOUNTER — Telehealth: Payer: Self-pay | Admitting: Gastroenterology

## 2023-07-24 NOTE — Telephone Encounter (Signed)
 Tammy: could we also get cardiology clearance for procedure? Thank you!

## 2023-07-25 ENCOUNTER — Telehealth: Payer: Self-pay

## 2023-07-25 NOTE — Telephone Encounter (Signed)
 Patient with diagnosis of afib on Eliquis for anticoagulation.    Procedure: Colonoscopy/ Esophagogastroduodenoscopy (EGD) with esophageal dilation Date of procedure: TBD   CHA2DS2-VASc Score = 6   This indicates a 9.7% annual risk of stroke. The patient's score is based upon: CHF History: 0 HTN History: 1 Diabetes History: 0 Stroke History: 2 Vascular Disease History: 1 Age Score: 1 Gender Score: 1      CrCl 55.8 ml/min Platelet count 211  Per office protocol, patient can hold Eliquis for 2 days prior to procedure.    **This guidance is not considered finalized until pre-operative APP has relayed final recommendations.**

## 2023-07-25 NOTE — Telephone Encounter (Signed)
  Request for patient to stop medication prior to procedure or is needing cleareance  07/25/23  Renee Benitez [birth year]  What type of surgery is being performed?  Colonoscopy/ Esophagogastroduodenoscopy (EGD) with esophageal dilation  When is surgery scheduled? TBD  What type of clearance is required (medical or pharmacy to hold medication or both? medical  Patient is needing to have cardiac clearance prior to being scheduled for procedure.  Name of physician performing surgery?  Dr. Loa Riling [facility]astroenterology at [facility] Phone: [phone] Fax: [phone]  Anethesia type (none, local, MAC, general)? MAC

## 2023-07-25 NOTE — Telephone Encounter (Signed)
   Name: Renee Benitez  DOB: June 13, 1950  MRN: [id number]  Primary Cardiologist: Armida Lander, MD   Preoperative team, please contact this patient and set up a phone call appointment for further preoperative risk assessment. Please obtain consent and complete medication review. Thank you for your help.  I confirm that guidance regarding antiplatelet and oral anticoagulation therapy has been completed and, if necessary, noted below.  Per office protocol, patient can hold Eliquis for 2 days prior to procedure.   Aspirin is not managed by cardiology service.  I also confirmed the patient resides in the state of [place] . As per [facility] Medical Board telemedicine laws, the patient must reside in the state in which the provider is licensed.   Ava Boatman, NP 07/25/2023, 12:16 PM [facility]

## 2023-07-25 NOTE — Telephone Encounter (Signed)
 Spoke with Tammy from [facility] and she states that patient does need clearance to hold Eliquis for 2 day prior to surgery and ASA is okay for patient to still take.

## 2023-07-25 NOTE — Telephone Encounter (Signed)
 Spoke with patient who is agreeable to do a tele visit on 4/18 at 1:40 pm. Med rec and consent have been done.

## 2023-07-25 NOTE — Telephone Encounter (Signed)
  Patient Consent for Virtual Visit        Renee Benitez has provided verbal consent on 07/25/2023 for a virtual visit (video or telephone).   CONSENT FOR VIRTUAL VISIT FOR:  Renee Benitez  By participating in this virtual visit I agree to the following:  I hereby voluntarily request, consent and authorize [facility] and its employed or contracted physicians, physician assistants, nurse practitioners or other licensed health care professionals (the Practitioner), to provide me with telemedicine health care services (the "Services") as deemed necessary by the treating Practitioner. I acknowledge and consent to receive the Services by the Practitioner via telemedicine. I understand that the telemedicine visit will involve communicating with the Practitioner through live audiovisual communication technology and the disclosure of certain medical information by electronic transmission. I acknowledge that I have been given the opportunity to request an in-person assessment or other available alternative prior to the telemedicine visit and am voluntarily participating in the telemedicine visit.  I understand that I have the right to withhold or withdraw my consent to the use of telemedicine in the course of my care at any time, without affecting my right to future care or treatment, and that the Practitioner or I may terminate the telemedicine visit at any time. I understand that I have the right to inspect all information obtained and/or recorded in the course of the telemedicine visit and may receive copies of available information for a reasonable fee.  I understand that some of the potential risks of receiving the Services via telemedicine include:  Delay or interruption in medical evaluation due to technological equipment failure or disruption; Information transmitted may not be sufficient (e.g. poor resolution of images) to allow for appropriate medical decision making by the  Practitioner; and/or  In rare instances, security protocols could fail, causing a breach of personal health information.  Furthermore, I acknowledge that it is my responsibility to provide information about my medical history, conditions and care that is complete and accurate to the best of my ability. I acknowledge that Practitioner's advice, recommendations, and/or decision may be based on factors not within their control, such as incomplete or inaccurate data provided by me or distortions of diagnostic images or specimens that may result from electronic transmissions. I understand that the practice of medicine is not an exact science and that Practitioner makes no warranties or guarantees regarding treatment outcomes. I acknowledge that a copy of this consent can be made available to me via my patient portal [facility] MyChart), or I can request a printed copy by calling the office of  [facility].    I understand that my insurance will be billed for this visit.   I have read or had this consent read to me. I understand the contents of this consent, which adequately explains the benefits and risks of the Services being provided via telemedicine.  I have been provided ample opportunity to ask questions regarding this consent and the Services and have had my questions answered to my satisfaction. I give my informed consent for the services to be provided through the use of telemedicine in my medical care

## 2023-07-25 NOTE — Telephone Encounter (Signed)
 Correction: [name]. CMA confirmed tele is on 07/28/23 @ 1:40, not 07/29/23

## 2023-07-27 ENCOUNTER — Telehealth: Payer: Self-pay | Admitting: Gastroenterology

## 2023-07-27 NOTE — Telephone Encounter (Signed)
 I sent for cardiac clearance on 07/24/23 and pt has an appointment tomorrow 07/28/23 with cardiology.

## 2023-07-27 NOTE — Telephone Encounter (Signed)
 Renee Benitez/[name]: I would like to get cardiac clearance as well due to her cardiac history.

## 2023-07-28 ENCOUNTER — Ambulatory Visit: Attending: Nurse Practitioner

## 2023-07-28 DIAGNOSIS — Z01810 Encounter for preprocedural cardiovascular examination: Secondary | ICD-10-CM | POA: Diagnosis not present

## 2023-07-28 NOTE — Progress Notes (Signed)
 Virtual Visit via Telephone Note   Because of Renee Benitez co-morbid illnesses, she is at least at moderate risk for complications without adequate follow up.  This format is felt to be most appropriate for this patient at this time.  Due to technical limitations with video connection (technology), today's appointment will be conducted as an audio only telehealth visit, and Renee Benitez verbally agreed to proceed in this manner.   All issues noted in this document were discussed and addressed.  No physical exam could be performed with this format.  Evaluation Performed:  Preoperative cardiovascular risk assessment _____________   Date:  07/28/2023   Patient ID:  Renee Benitez, DOB 11/19/1950, MRN [id number] Patient Location:  Home Provider location:   Office  Primary Care Provider:  Aliene Beams, MD Primary Cardiologist:  Dina Rich, MD  Chief Complaint / Patient Profile   73 y.o. y/o female with a h/o CAD s/p NSTEMI 05/2022, HFimEF, NICM, AF, CVA, HLD, HTN who is pending colonoscopy and EGD and presents today for telephonic preoperative cardiovascular risk assessment.  History of Present Illness    Renee Benitez is a 73 y.o. female who presents via audio/video conferencing for a telehealth visit today.  Pt was last seen in cardiology clinic on 02/25/2023 by Dr. Dina Rich.  At that time Renee Benitez was doing well and compliant with medications with no complaints of chest pain.  The patient is now pending procedure as outlined above. Since her last visit, she has been doing well with no breakthrough episodes of atrial fibrillation.  She is compliant with her medications and denies any adverse reactions.  She is able to complete greater than 4 METS of activity without any difficulty.  She denies chest pain, shortness of breath, lower extremity edema, fatigue, palpitations, melena, hematuria, hemoptysis, diaphoresis, weakness, presyncope, syncope, orthopnea, and  PND.    Past Medical History    Past Medical History:  Diagnosis Date   Atrial fibrillation [facility])    Current use of estrogen therapy 01/31/2013   Dysrhythmia    AFib   Hypertension    Macular degeneration    Menopausal syndrome    Overactive bladder    Right ovarian cyst 01/25/2014   Past Surgical History:  Procedure Laterality Date   A-FLUTTER ABLATION N/A 03/18/2021   Procedure: A-FLUTTER ABLATION;  Surgeon: [name], MD;  [facility]: [facility] INVASIVE CV LAB;  Service: Cardiovascular;  Laterality: N/A;   ABDOMINAL HYSTERECTOMY     APPENDECTOMY     ATRIAL FIBRILLATION ABLATION N/A 11/05/2022   Procedure: ATRIAL FIBRILLATION ABLATION;  Surgeon: Maurice Small, MD;  Location: [facility] INVASIVE CV LAB;  Service: Cardiovascular;  Laterality: N/A;   CARDIOVERSION N/A 09/14/2016   Procedure: CARDIOVERSION;  Surgeon: [name], MD;  Location: [facility] ORS;  Service: Cardiovascular;  Laterality: N/A;   CARDIOVERSION N/A 02/09/2021   Procedure: CARDIOVERSION;  Surgeon: [name], MD;  Location: [facility] ENDOSCOPY;  Service: Cardiovascular;  Laterality: N/A;   CARDIOVERSION N/A 12/07/2022   Procedure: CARDIOVERSION;  Surgeon: Chilton Si, MD;  Location: [facility] INVASIVE CV LAB;  Service: Cardiovascular;  Laterality: N/A;   CATARACT EXTRACTION W/ INTRAOCULAR LENS  IMPLANT, BILATERAL Bilateral    COLONOSCOPY  12/04/2003   Friable anal canal otherwise normal rectum and colon   COLONOSCOPY N/A 03/19/2013   Dr. [name]: diverticulosis, hemorrhoids. next TCS 10 years   COLONOSCOPY N/A 05/02/2019   entire colon normal, non-bleeding external and internal hemorrhoids, grade 3. 5 year  screening due to family history.   ESOPHAGOGASTRODUODENOSCOPY N/A 10/17/2018   moderately severe erosive esophagitis, esophagus status post dilation, small hiatal hernia, otherwise normal   IR ANGIO INTRA EXTRACRAN SEL INTERNAL CAROTID BILAT MOD SED  06/16/2021   IR ANGIO VERTEBRAL SEL VERTEBRAL UNI R MOD SED   06/16/2021   IR CT HEAD LTD  01/05/2021   IR CT HEAD LTD  01/05/2021   IR INTRA CRAN STENT  01/05/2021   IR PERCUTANEOUS ART THROMBECTOMY/INFUSION INTRACRANIAL INC DIAG ANGIO  01/05/2021   IR US GUIDE VASC ACCESS LEFT  01/05/2021   IR US GUIDE VASC ACCESS RIGHT  06/16/2021   MALONEY DILATION N/A 10/17/2018   Procedure: Elease Hashimoto DILATION;  Surgeon: [name], MD;  Location: [facility] ENDO SUITE;  Service: Endoscopy;  Laterality: N/A;   RADIOLOGY WITH ANESTHESIA N/A 01/05/2021   Procedure: IR WITH ANESTHESIA;  Surgeon: Radiologist, Medication, MD;  Location: [facility] OR;  Service: Radiology;  Laterality: N/A;   RIGHT/LEFT HEART CATH AND CORONARY ANGIOGRAPHY N/A 05/20/2022   Procedure: RIGHT/LEFT HEART CATH AND CORONARY ANGIOGRAPHY;  Surgeon: Runell Gess, MD;  Location: [facility] INVASIVE CV LAB;  Service: Cardiovascular;  Laterality: N/A;   TEE WITHOUT CARDIOVERSION N/A 09/14/2016   Procedure: TRANSESOPHAGEAL ECHOCARDIOGRAM (TEE) WITH PROPOFOL;  Surgeon: [name], MD;  Location: [facility] ORS;  Service: Cardiovascular;  Laterality: N/A;   TONSILECTOMY, ADENOIDECTOMY, BILATERAL MYRINGOTOMY AND TUBES      Allergies  No Known Allergies  Home Medications    Prior to Admission medications   Medication Sig Start Date End Date Taking? Authorizing Provider  acetaminophen (TYLENOL) 500 MG tablet Take 500-1,000 mg by mouth as needed for moderate pain or headache.    [provider]  aspirin 81 MG EC tablet Take 1 tablet (81 mg total) by mouth daily. Patient taking differently: Take 81 mg by mouth at bedtime. 01/15/21   Bailey-Modzik, Delila A, NP  Calcium Carb-Cholecalciferol (CALCIUM 600 + D PO) Take 1 tablet by mouth at bedtime.    [provider]  Cholecalciferol (DIALYVITE VITAMIN D 5000) 125 MCG (5000 UT) capsule Take 5,000 Units by mouth at bedtime.    [provider]  ELIQUIS 5 MG TABS tablet TAKE ONE TABLET (5MG  TOTAL) BY MOUTH TWOTIMES DAILY 04/01/23   Antoine Poche, MD  ENTRESTO 24-26 MG TAKE ONE TABLET BY MOUTH TWICE A DAY DISCONTINUE LOSARTAN 06/06/23   Antoine Poche, MD  escitalopram (LEXAPRO) 20 MG tablet Take 20 mg by mouth daily. 04/15/21   [provider]  famotidine (PEPCID) 20 MG tablet Take 20 mg by mouth daily as needed for heartburn or indigestion.    [provider]  JARDIANCE 10 MG TABS tablet TAKE ONE (1) TABLET BY MOUTH EVERY DAY BEFORE BREAKFAST 04/01/23   Charlsie Quest, NP  levothyroxine (SYNTHROID) 100 MCG tablet Take 100 mcg by mouth daily at 2 am. 01/13/22   [provider]  loratadine (CLARITIN) 10 MG tablet Take 10 mg by mouth daily as needed for allergies.    [provider]  Magnesium 400 MG CAPS Take 400 mg by mouth at bedtime. magnesium citrate    [provider]  Metamucil Fiber CHEW Chew 1 tablet by mouth daily as needed (Constipation).    [provider]  Multiple Vitamins-Minerals (MULTIVITAMIN WITH MINERALS) tablet Take 1 tablet by mouth in the morning. Women's One A Day    [provider]  Multiple Vitamins-Minerals (PRESERVISION AREDS 2) CAPS Take 1 capsule by mouth  2 (two) times daily.    [provider]  Omega-3 Fatty Acids (FISH OIL ULTRA) 1400 MG CAPS Take 1,400 mg by mouth in the morning.    [provider]  pantoprazole (PROTONIX) 40 MG tablet TAKE ONE TABLET (40MG  TOTAL) BY MOUTH DAILY 06/08/22   Delman Ferns, NP  rosuvastatin (CRESTOR) 20 MG tablet Take 20 mg by mouth in the morning. 01/20/21   [provider]  simethicone (MYLICON) 80 MG chewable tablet Chew 80 mg by mouth as needed for flatulence.    [provider]  solifenacin (VESICARE) 5 MG tablet Take 5 mg by mouth daily. 07/21/22   [provider]    Physical Exam    Vital Signs:  Renee Benitez does not have vital signs available for review today.  Given telephonic nature of communication, physical exam is limited. AAOx3. NAD. Normal affect.   Speech and respirations are unlabored.  Accessory Clinical Findings    None  Assessment & Plan    1.  Preoperative Cardiovascular Risk Assessment: - Patient's RCRI score is 11%  The patient affirms she has been doing well without any new cardiac symptoms. They are able to achieve 7 METS without cardiac limitations. Therefore, based on ACC/AHA guidelines, the patient would be at acceptable risk for the planned procedure without further cardiovascular testing. The patient was advised that if she develops new symptoms prior to surgery to contact our office to arrange for a follow-up visit, and she verbalized understanding.   The patient was advised that if she develops new symptoms prior to surgery to contact our office to arrange for a follow-up visit, and she verbalized understanding.  Due to prior stroke would prefer just 1 day hold, if GI wants 2 day hold, will recommend review with primary cardiologist.    - Patient can hold Jardiance 3 days prior to procedure    A copy of this note will be routed to requesting surgeon.  Time:   Today, I have spent 7 minutes with the patient with telehealth technology discussing medical history, symptoms, and management plan.     Francene Ing, Retha Cast, NP  07/28/2023, 7:06 AM

## 2023-08-02 NOTE — Telephone Encounter (Signed)
 Pt had a telephone visit on 07/28/23 with cardiology. Please advise. Thank you

## 2023-08-03 NOTE — Telephone Encounter (Signed)
 Clearance obtained. Please have her hold Eliquis  only 24 hours prior (DAY BEFORE). She needs to be a midday or later afternoon case.   Needs diagnostic colonoscopy, EGD/dilation with Dr. Riley Cheadle. ASA 3

## 2023-08-04 ENCOUNTER — Other Ambulatory Visit: Payer: Self-pay | Admitting: *Deleted

## 2023-08-04 ENCOUNTER — Encounter: Payer: Self-pay | Admitting: *Deleted

## 2023-08-04 MED ORDER — PEG 3350-KCL-NA BICARB-NACL 420 G PO SOLR: 4000.0000 mL | Freq: Once | ORAL | 0 refills | Status: AC

## 2023-08-04 NOTE — Telephone Encounter (Signed)
 [employer] PA:   Notification or Prior Authorization is not required for the requested services You are not required to submit a notification/prior authorization based on the information provided. If you have general questions about the prior authorization requirements, visit [email or web address] > Clinician Resources > Advance and Admission Notification Requirements. The number above acknowledges your notification. Please write this reference number down for future reference. If you would like to request an organization determination, please call us  at [phone]. Decision ID #: [id number]

## 2023-08-04 NOTE — Telephone Encounter (Signed)
 Pt has been scheduled for 08/31/23 at 1pm. Instructions sent via mychart and prep sent to the pharmacy.

## 2023-08-08 ENCOUNTER — Ambulatory Visit (HOSPITAL_COMMUNITY)
Admission: RE | Admit: 2023-08-08 | Discharge: 2023-08-08 | Disposition: A | Discharge status: Home / Self Care | Source: Ambulatory Visit | Attending: Internal Medicine | Admitting: Internal Medicine

## 2023-08-08 ENCOUNTER — Encounter (HOSPITAL_COMMUNITY): Payer: Self-pay

## 2023-08-08 ENCOUNTER — Other Ambulatory Visit: Payer: Self-pay

## 2023-08-08 VITALS — BP 140/82 | HR 64 | Ht 66.5 in | Wt 198.2 lb

## 2023-08-08 DIAGNOSIS — D6869 Other thrombophilia: Secondary | ICD-10-CM

## 2023-08-08 DIAGNOSIS — I4891 Unspecified atrial fibrillation: Secondary | ICD-10-CM

## 2023-08-08 DIAGNOSIS — I4819 Other persistent atrial fibrillation: Secondary | ICD-10-CM

## 2023-08-08 MED ORDER — EMPAGLIFLOZIN 10 MG PO TABS: 10.0000 mg | ORAL_TABLET | Freq: Every day | ORAL | 6 refills | Status: DC

## 2023-08-08 NOTE — Telephone Encounter (Signed)
 This encounter was created in error - please disregard.

## 2023-08-08 NOTE — Progress Notes (Addendum)
 Primary Care Physician: Dorena Gander, MD Primary Cardiologist: Dr Londa Rival  Primary Electrophysiologist: Dr Arlester Ladd Referring Physician: Marinell Siad NP   Renee Benitez is a 73 y.o. female with a history of HTN, CVA, and atrial fibrillation who presents for follow up in the [facility]. Patient is on Eliquis  for a CHADS2VASC score of 5. She has been maintained on flecainide  and diltiazem  but called in with symptoms of ongoing palpitations and SOB with exertion on 07/22/20. She admits that her symptoms had been ongoing for about 2 months. ECG 07/29/20 showed atypical atrial flutter with rapid rates. Her diltiazem  was increased. She was also started on metoprolol  which converted her to SR.  Patient was hospitalized with left sided weakness on 01/05/21 and was diagnosed with an acute CVA. TPA was not given because she was on Eliquis  but she did have successful mechanical thrombectomy of PCA with stenting and angioplasty. She reports that she was 3 hours late taking her Eliquis  that day but did not miss any doses. She has been referred by her PCP for a hypercoagulable evaluation with hematology.   On follow up today, patient is s/p atrial flutter (CTI and mitral annular) ablation with Dr Carolynne Citron on 03/18/21. She now has an Apple watch which has shown some episodes of afib over the past two months. The episodes occur about once per week and last about 3-4 hours. She is not as symptomatic as she was prior to ablation. She denies any bleeding issues on anticoagulation.   On follow up 11/29/22, she is currently in Afib. S/p Afib ablation on 11/05/22 by Dr. Arlester Ladd. She contacted office on 7/30 noting she went back into Afib. She began amiodarone  200 mg BID x 2 weeks for a load and will then transition to 200 mg once daily. She will complete amiodarone  load this Thursday. She feels fatigue when in Afib. No chest pain, SOB, or trouble swallowing. Leg sites healed without issue. No missed  doses of anticoagulant.  On follow up 12/22/22, she is currently in NSR. S/p successful DCCV on 12/07/22. She has had no episodes of Afib since cardioversion. She is currently taking amiodarone  200 mg daily. No bleeding issues on Eliquis . She has noted her HR to be slow since cardioversion and has been taking Toprol  50 mg daily instead of 100 mg.   On follow up 08/08/23, she is currently in NSR. Amiodarone  was discontinued in October when she ran out of medication; she also was noted to have issue with bradycardia. She is doing well overall. No new episodes of Afib since last office visit with Dr. Arlester Ladd.   Today, she denies symptoms of orthopnea, PND, lower extremity edema, dizziness, presyncope, syncope, snoring, daytime somnolence, bleeding, or neurologic sequela. The patient is tolerating medications without difficulties and is otherwise without complaint today.   Atrial Fibrillation Risk Factors:  she does have symptoms or diagnosis of sleep apnea. she does not have a history of rheumatic fever.   she has a BMI of Body mass index is 31.51 kg/m.Renee Benitez Filed Weights   08/08/23 1029  Weight: 89.9 kg    Family History  Problem Relation Age of Onset   Coronary artery disease Father    Cancer Father        bladder cancer   Arrhythmia Sister        Reportedly had ablation of SVT   Hypertension Sister    Colon cancer Sister 66       s/p surgical resection,  no chemo/radiation   Colon cancer Maternal Grandmother    Hypertension Son    Atrial Fibrillation Management history:  Previous antiarrhythmic drugs: flecainide , amiodarone   Previous cardioversions: 2018, 02/09/21 Previous ablations: 03/18/21, 11/05/22, 12/07/22 CHADS2VASC score: 5 Anticoagulation history: Eliquis    Past Medical History:  Diagnosis Date   Atrial fibrillation (HCC)    Current use of estrogen therapy 01/31/2013   Dysrhythmia    AFib   Hypertension    Macular degeneration    Menopausal syndrome    Overactive  bladder    Right ovarian cyst 01/25/2014    Current Outpatient Medications  Medication Sig Dispense Refill   acetaminophen  (TYLENOL ) 500 MG tablet Take 500-1,000 mg by mouth as needed for moderate pain or headache.     aspirin  81 MG EC tablet Take 1 tablet (81 mg total) by mouth daily. (Patient taking differently: Take 81 mg by mouth at bedtime.) 150 tablet 2   Calcium  Carb-Cholecalciferol  (CALCIUM  600 + D PO) Take 1 tablet by mouth at bedtime.     Cholecalciferol  (DIALYVITE VITAMIN D  5000) 125 MCG (5000 UT) capsule Take 5,000 Units by mouth at bedtime.     ELIQUIS  5 MG TABS tablet TAKE ONE TABLET (5MG  TOTAL) BY MOUTH TWOTIMES DAILY 180 tablet 1   ENTRESTO  24-26 MG TAKE ONE TABLET BY MOUTH TWICE A DAY DISCONTINUE LOSARTAN  60 tablet 11   escitalopram  (LEXAPRO ) 20 MG tablet Take 20 mg by mouth daily.     famotidine (PEPCID) 20 MG tablet Take 20 mg by mouth daily as needed for heartburn or indigestion.     JARDIANCE  10 MG TABS tablet TAKE ONE (1) TABLET BY MOUTH EVERY DAY BEFORE BREAKFAST 30 tablet 3   levothyroxine  (SYNTHROID ) 100 MCG tablet Take 100 mcg by mouth daily at 2 am.     loratadine  (CLARITIN ) 10 MG tablet Take 10 mg by mouth daily as needed for allergies.     Magnesium  400 MG CAPS Take 400 mg by mouth at bedtime. magnesium  citrate     Metamucil Fiber CHEW Chew 1 tablet by mouth daily as needed (Constipation).     Multiple Vitamins-Minerals (MULTIVITAMIN WITH MINERALS) tablet Take 1 tablet by mouth in the morning. Women's One A Day     Multiple Vitamins-Minerals (PRESERVISION AREDS 2) CAPS Take 1 capsule by mouth 2 (two) times daily.     Omega-3 Fatty Acids (FISH OIL  ULTRA) 1400 MG CAPS Take 1,400 mg by mouth in the morning.     pantoprazole  (PROTONIX ) 40 MG tablet TAKE ONE TABLET (40MG  TOTAL) BY MOUTH DAILY 90 tablet 3   rosuvastatin  (CRESTOR ) 20 MG tablet Take 20 mg by mouth in the morning.     simethicone  (MYLICON) 80 MG chewable tablet Chew 80 mg by mouth as needed for flatulence.      No current facility-administered medications for this encounter.    No Known Allergies  ROS- All systems are reviewed and negative except as per the HPI above.  Physical Exam: Vitals:   08/08/23 1029  BP: (!) 140/82  Pulse: 64  Weight: 89.9 kg  Height: 5' 6.5" (1.689 m)    GEN- The patient is well appearing, alert and oriented x 3 today.   Neck - no JVD or carotid bruit noted Lungs- Clear to ausculation bilaterally, normal work of breathing Heart- Regular rate and rhythm, no murmurs, rubs or gallops, PMI not laterally displaced Extremities- no clubbing, cyanosis, or edema Skin - no rash or ecchymosis noted   Wt Readings from Last 3 Encounters:  08/08/23 89.9 kg  07/13/23 90.3 kg  03/02/23 90.1 kg    EKG today demonstrates  Vent. rate 64 BPM PR interval 202 ms QRS duration 78 ms QT/QTcB 404/416 ms P-R-T axes 83 17 52 Normal sinus rhythm Normal ECG When compared with ECG of 03-Feb-2023 11:14, No significant change found  Echo 06/18/22 demonstrated   1. Left ventricular ejection fraction, by estimation, is 65 to 70%. The  left ventricle has normal function. The left ventricle has no regional  wall motion abnormalities. Left ventricular diastolic function could not  be evaluated.   2. The inferior vena cava is dilated in size with >50% respiratory  variability, suggesting right atrial pressure of 8 mmHg.   Comparison(s): Changes from prior study are noted. LVEF improved from  30-35% with RWMA on 05/19/2022 to normal LVEF with no RWMA now.  Conclusion(s)/Recommendation(s): No intracardiac source of embolism detected on this transthoracic study. A transesophageal echocardiogram is recommended to exclude cardiac source of embolism if clinically indicated.   Epic records are reviewed at length today  CHA2DS2-VASc Score = 6  The patient's score is based upon: CHF History: 0 HTN History: 1 Diabetes History: 0 Stroke History: 2 Vascular Disease History: 1 Age  Score: 1 Gender Score: 1      ASSESSMENT AND PLAN: 1. Persistent Atrial Fibrillation/atrial flutter The patient's CHA2DS2-VASc score is 6, indicating a 9.7% annual risk of stroke.   S/p atrial flutter ablation 03/18/21 S/p Afib ablation on 11/05/22 by Dr. Arlester Ladd. S/p successful DCCV on 12/07/22. Amiodarone  discontinued 01/2023.  She is currently in NSR. She is doing well overall so no changes recommended at this time.    2. Secondary Hypercoagulable State (ICD10:  D68.69) The patient is at significant risk for stroke/thromboembolism based upon her CHA2DS2-VASc Score of 6.  Continue Apixaban  (Eliquis ).  No missed doses. We briefly discussed Watchman procedure per patient asking about it.  3. HTN Stable today.    Follow up with Dr. Arlester Ladd in 6 months.    Renee Amber, PA-C Afib Clinic [facility] [address] [place], [place] [address] [phone] 08/08/2023 11:04 AM

## 2023-08-18 ENCOUNTER — Other Ambulatory Visit: Payer: Self-pay | Admitting: Gastroenterology

## 2023-08-26 NOTE — Patient Instructions (Addendum)
 Renee Benitez  08/26/2023     @PREFPERIOPPHARMACY @   Your procedure is scheduled on 08/31/2023.   Report to [facility] at 11:00 A.M.   Call this number if you have problems the morning of surgery:  [phone]  If you experience any cold or flu symptoms such as cough, fever, chills, shortness of breath, etc. between now and your scheduled surgery, please notify us  at the above number.   Remember:  Please follow the diet and prep instructions given to you by Dr Drucilla Georgis office.   You may drink clear liquids until 9:00AM .  Clear liquids allowed are:                    Water , Juice (No red color; non-citric and without pulp; diabetics please choose diet or no sugar options), Carbonated beverages (diabetics please choose diet or no sugar options), Clear Tea (No creamer, milk, or cream, including half & half and powdered creamer), Black Coffee Only (No creamer, milk or cream, including half & half and powdered creamer), Plain Jell-O Only (No red color; diabetics please choose no sugar options), Clear Sports drink (No red color; diabetics please choose diet or no sugar options), and Plain Popsicles Only (No red color; diabetics please choose no sugar options)    Take these medicines the morning of surgery with A SIP OF WATER  : Entresto  Lexapro  Pepcid Synthroid  Claritin  Protonix    Last Jardiance  should be on 08/27/2023.   Eliquis  should be held for 24 hours prior to procedure.     Do not wear jewelry, make-up or nail polish, including gel polish,  artificial nails, or any other type of covering on natural nails (fingers and  toes).  Do not wear lotions, powders, or perfumes, or deodorant.  Do not shave 48 hours prior to surgery.  Men may shave face and neck.  Do not bring valuables to the hospital.  [facility] is not responsible for any belongings or valuables.  Contacts, dentures or bridgework may not be worn into surgery.  Leave your suitcase in the car.  After surgery it may be  brought to your room.  For patients admitted to the hospital, discharge time will be determined by your treatment team.  Patients discharged the day of surgery will not be allowed to drive home.   Name and phone number of your driver:   family Special instructions:  N/A  Please read over the following fact sheets that you were given. Care and Recovery After Surgery  Colonoscopy, Adult A colonoscopy is a procedure to look at the entire large intestine. This procedure is done using a long, thin, flexible tube that has a camera on the end. You may have a colonoscopy: As a part of normal colorectal screening. If you have certain symptoms, such as: A low number of red blood cells in your blood (anemia). Diarrhea that does not go away. Pain in your abdomen. Blood in your stool. A colonoscopy can help screen for and diagnose medical problems, including: An abnormal growth of cells or tissue (tumor). Abnormal growths within the lining of your intestine (polyps). Inflammation. Areas of bleeding. Tell your health care provider about: Any allergies you have. All medicines you are taking, including vitamins, herbs, eye drops, creams, and over-the-counter medicines. Any problems you or family members have had with anesthetic medicines. Any bleeding problems you have. Any surgeries you have had. Any medical conditions you have. Any problems you have had with having bowel movements. Whether  you are pregnant or may be pregnant. What are the risks? Generally, this is a safe procedure. However, problems may occur, including: Bleeding. Damage to your intestine. Allergic reactions to medicines given during the procedure. Infection. This is rare. What happens before the procedure? Eating and drinking restrictions Follow instructions from your health care provider about eating or drinking restrictions, which may include: A few days before the procedure: Follow a low-fiber diet. Avoid nuts,  seeds, dried fruit, raw fruits, and vegetables. 1-3 days before the procedure: Eat only gelatin dessert or ice pops. Drink only clear liquids, such as water , clear juice, clear broth or bouillon, black coffee or tea, or clear soft drinks or sports drinks. Avoid liquids that contain red or purple dye. The day of the procedure: Do not eat solid foods. You may continue to drink clear liquids until up to 2 hours before the procedure. Do not eat or drink anything starting 2 hours before the procedure, or within the time period that your health care provider recommends. Bowel prep If you were prescribed a bowel prep to take by mouth (orally) to clean out your colon: Take it as told by your health care provider. Starting the day before your procedure, you will need to drink a large amount of liquid medicine. The liquid will cause you to have many bowel movements of loose stool until your stool becomes almost clear or light green. If your skin or the opening between the buttocks (anus) gets irritated from diarrhea, you may relieve the irritation using: Wipes with medicine in them, such as adult wet wipes with aloe and vitamin E. A product to soothe skin, such as petroleum jelly. If you vomit while drinking the bowel prep: Take a break for up to 60 minutes. Begin the bowel prep again. Call your health care provider if you keep vomiting or you cannot take the bowel prep without vomiting. To clean out your colon, you may also be given: Laxative medicines. These help you have a bowel movement. Instructions for enema use. An enema is liquid medicine injected into your rectum. Medicines Ask your health care provider about: Changing or stopping your regular medicines or supplements. This is especially important if you are taking iron supplements, diabetes medicines, or blood thinners. Taking medicines such as aspirin  and ibuprofen. These medicines can thin your blood. Do not take these medicines unless your  health care provider tells you to take them. Taking over-the-counter medicines, vitamins, herbs, and supplements. General instructions Ask your health care provider what steps will be taken to help prevent infection. These may include washing skin with a germ-killing soap. If you will be going home right after the procedure, plan to have a responsible adult: Take you home from the hospital or clinic. You will not be allowed to drive. Care for you for the time you are told. What happens during the procedure?  An IV will be inserted into one of your veins. You will be given a medicine to make you fall asleep (general anesthetic). You will lie on your side with your knees bent. A lubricant will be put on the tube. Then the tube will be: Inserted into your anus. Gently eased through all parts of your large intestine. Air will be sent into your colon to keep it open. This may cause some pressure or cramping. Images will be taken with the camera and will appear on a screen. A small tissue sample may be removed to be looked at under a microscope (biopsy). The  tissue may be sent to a lab for testing if any signs of problems are found. If small polyps are found, they may be removed and checked for cancer cells. When the procedure is finished, the tube will be removed. The procedure may vary among health care providers and hospitals. What happens after the procedure? Your blood pressure, heart rate, breathing rate, and blood oxygen level will be monitored until you leave the hospital or clinic. You may have a small amount of blood in your stool. You may pass gas and have mild cramping or bloating in your abdomen. This is caused by the air that was used to open your colon during the exam. If you were given a sedative during the procedure, it can affect you for several hours. Do not drive or operate machinery until your health care provider says that it is safe. It is up to you to get the results of  your procedure. Ask your health care provider, or the department that is doing the procedure, when your results will be ready. Summary A colonoscopy is a procedure to look at the entire large intestine. Follow instructions from your health care provider about eating and drinking before the procedure. If you were prescribed an oral bowel prep to clean out your colon, take it as told by your health care provider. During the colonoscopy, a flexible tube with a camera on its end is inserted into the anus and then passed into all parts of the large intestine. This information is not intended to replace advice given to you by your health care provider. Make sure you discuss any questions you have with your health care provider. Document Revised: 05/11/2022 Document Reviewed: 11/19/2020 Elsevier Patient Education  2024 [employer]  Upper Endoscopy, Adult Upper endoscopy is a procedure to look inside the upper GI (gastrointestinal) tract. The upper GI tract is made up of: The esophagus. This is the part of the body that moves food from your mouth to your stomach. The stomach. The duodenum. This is the first part of your small intestine. This procedure is also called esophagogastroduodenoscopy (EGD) or gastroscopy. In this procedure, your health care provider passes a thin, flexible tube (endoscope) through your mouth and down your esophagus into your stomach and into your duodenum. A small camera is attached to the end of the tube. Images from the camera appear on a monitor in the exam room. During this procedure, your health care provider may also remove a small piece of tissue to be sent to a lab and examined under a microscope (biopsy). Your health care provider may do an upper endoscopy to diagnose cancers of the upper GI tract. You may also have this procedure to find the cause of other conditions, such as: Stomach pain. Heartburn. Pain or problems when swallowing. Nausea and vomiting. Stomach  bleeding. Stomach ulcers. Tell a health care provider about: Any allergies you have. All medicines you are taking, including vitamins, herbs, eye drops, creams, and over-the-counter medicines. Any problems you or family members have had with anesthetic medicines. Any bleeding problems you have. Any surgeries you have had. Any medical conditions you have. Whether you are pregnant or may be pregnant. What are the risks? Your healthcare provider will talk with you about risks. These may include: Infection. Bleeding. Allergic reactions to medicines. A tear or hole (perforation) in the esophagus, stomach, or duodenum. What happens before the procedure? When to stop eating and drinking Follow instructions from your health care provider about what you  may eat and drink. These may include: 8 hours before your procedure Stop eating most foods. Do not eat meat, fried foods, or fatty foods. Eat only light foods, such as toast or crackers. All liquids are okay except energy drinks and alcohol. 6 hours before your procedure Stop eating. Drink only clear liquids, such as water , clear fruit juice, black coffee, plain tea, and sports drinks. Do not drink energy drinks or alcohol. 2 hours before your procedure Stop drinking all liquids. You may be allowed to take medicines with small sips of water . If you do not follow your health care provider's instructions, your procedure may be delayed or canceled. Medicines Ask your health care provider about: Changing or stopping your regular medicines. This is especially important if you are taking diabetes medicines or blood thinners. Taking medicines such as aspirin  and ibuprofen. These medicines can thin your blood. Do not take these medicines unless your health care provider tells you to take them. Taking over-the-counter medicines, vitamins, herbs, and supplements. General instructions If you will be going home right after the procedure, plan to have  a responsible adult: Take you home from the hospital or clinic. You will not be allowed to drive. Care for you for the time you are told. What happens during the procedure?  An IV will be inserted into one of your veins. You may be given one or more of the following: A medicine to help you relax (sedative). A medicine to numb the throat (local anesthetic). You will lie on your left side on an exam table. Your health care provider will pass the endoscope through your mouth and down your esophagus. Your health care provider will use the scope to check the inside of your esophagus, stomach, and duodenum. Biopsies may be taken. The endoscope will be removed. The procedure may vary among health care providers and hospitals. What happens after the procedure? Your blood pressure, heart rate, breathing rate, and blood oxygen level will be monitored until you leave the hospital or clinic. When your throat is no longer numb, you may be given some fluids to drink. If you were given a sedative during the procedure, it can affect you for several hours. Do not drive or operate machinery until your health care provider says that it is safe. It is up to you to get the results of your procedure. Ask your health care provider, or the department that is doing the procedure, when your results will be ready. Contact a health care provider if you: Have a sore throat that lasts longer than 1 day. Have a fever. Get help right away if you: Vomit blood or your vomit looks like coffee grounds. Have bloody, black, or tarry stools. Have a very bad sore throat or you cannot swallow. Have difficulty breathing or very bad pain in your chest or abdomen. These symptoms may be an emergency. Get help right away. Call 911. Do not wait to see if the symptoms will go away. Do not drive yourself to the hospital. Summary Upper endoscopy is a procedure to look inside the upper GI tract. During the procedure, an IV will be  inserted into one of your veins. You may be given a medicine to help you relax. The endoscope will be passed through your mouth and down your esophagus. Follow instructions from your health care provider about what you can eat and drink. This information is not intended to replace advice given to you by your health care provider. Make sure you discuss  any questions you have with your health care provider. Document Revised: 07/08/2021 Document Reviewed: 07/08/2021 Elsevier Patient Education  2024 [employer]   Monitored Anesthesia Care Anesthesia refers to the techniques, procedures, and medicines that help a person stay safe and comfortable during surgery. Monitored anesthesia care, or sedation, is one type of anesthesia. You may have sedation if you do not need to be asleep for your procedure. Procedures that use sedation may include: Surgery to remove cataracts from your eyes. A dental procedure. A biopsy. This is when a tissue sample is removed and looked at under a microscope. You will be watched closely during your procedure. Your level of sedation or type of anesthesia may be changed to fit your needs. Tell a health care provider about: Any allergies you have. All medicines you are taking, including vitamins, herbs, eye drops, creams, and over-the-counter medicines. Any problems you or family members have had with anesthesia. Any bleeding problems you have. Any surgeries you have had. Any medical conditions or illnesses you have. This includes sleep apnea, cough, fever, or the flu. Whether you are pregnant or may be pregnant. Whether you use cigarettes, alcohol, or drugs. Any use of steroids, whether by mouth or as a cream. What are the risks? Your health care provider will talk with you about risks. These may include: Getting too much medicine (oversedation). Nausea. Allergic reactions to medicines. Trouble breathing. If this happens, a breathing tube may be used to help you  breathe. It will be removed when you are awake and breathing on your own. Heart trouble. Lung trouble. Confusion that gets better with time (emergence delirium). What happens before the procedure? When to stop eating and drinking Follow instructions from your health care provider about what you may eat and drink. These may include: 8 hours before your procedure Stop eating most foods. Do not eat meat, fried foods, or fatty foods. Eat only light foods, such as toast or crackers. All liquids are okay except energy drinks and alcohol. 6 hours before your procedure Stop eating. Drink only clear liquids, such as water , clear fruit juice, black coffee, plain tea, and sports drinks. Do not drink energy drinks or alcohol. 2 hours before your procedure Stop drinking all liquids. You may be allowed to take medicines with small sips of water . If you do not follow your health care provider's instructions, your procedure may be delayed or canceled. Medicines Ask your health care provider about: Changing or stopping your regular medicines. These include any diabetes medicines or blood thinners you take. Taking medicines such as aspirin  and ibuprofen. These medicines can thin your blood. Do not take them unless your health care provider tells you to. Taking over-the-counter medicines, vitamins, herbs, and supplements. Testing You may have an exam or testing. You may have a blood or urine sample taken. General instructions Do not use any products that contain nicotine or tobacco for at least 4 weeks before the procedure. These products include cigarettes, chewing tobacco, and vaping devices, such as e-cigarettes. If you need help quitting, ask your health care provider. If you will be going home right after the procedure, plan to have a responsible adult: Take you home from the hospital or clinic. You will not be allowed to drive. Care for you for the time you are told. What happens during the  procedure?  Your blood pressure, heart rate, breathing, level of pain, and blood oxygen level will be monitored. An IV will be inserted into one of your veins. You  may be given: A sedative. This helps you relax. Anesthesia. This will: Numb certain areas of your body. Make you fall asleep for surgery. You will be given medicines as needed to keep you comfortable. The more medicine you are given, the deeper your level of sedation will be. Your level of sedation may be changed to fit your needs. There are three levels of sedation: Mild sedation. At this level, you may feel awake and relaxed. You will be able to follow directions. Moderate sedation. At this level, you will be sleepy. You may not remember the procedure. Deep sedation. At this level, you will be asleep. You will not remember the procedure. How you get the medicines will depend on your age and the procedure. They may be given as: A pill. This may be taken by mouth (orally) or inserted into the rectum. An injection. This may be into a vein or muscle. A spray through the nose. After your procedure is over, the medicine will be stopped. The procedure may vary among health care providers and hospitals. What happens after the procedure? Your blood pressure, heart rate, breathing rate, and blood oxygen level will be monitored until you leave the hospital or clinic. You may feel sleepy, clumsy, or nauseous. You may not remember what happened during or after the procedure. Sedation can affect you for several hours. Do not drive or use machinery until your health care provider says that it is safe. This information is not intended to replace advice given to you by your health care provider. Make sure you discuss any questions you have with your health care provider. Document Revised: 08/24/2021 Document Reviewed: 08/24/2021 Elsevier Patient Education  2024 [employer].

## 2023-08-29 ENCOUNTER — Encounter (HOSPITAL_COMMUNITY)
Admission: RE | Admit: 2023-08-29 | Discharge: 2023-08-29 | Disposition: A | Discharge status: Home / Self Care | Source: Ambulatory Visit | Attending: Internal Medicine | Admitting: Internal Medicine

## 2023-08-29 ENCOUNTER — Encounter (HOSPITAL_COMMUNITY): Payer: Self-pay

## 2023-08-29 VITALS — BP 140/82 | HR 64 | Temp 98.0°F | Resp 18 | Ht 66.5 in | Wt 198.2 lb

## 2023-08-29 DIAGNOSIS — Z01818 Encounter for other preprocedural examination: Secondary | ICD-10-CM | POA: Diagnosis present

## 2023-08-29 DIAGNOSIS — Z01810 Encounter for preprocedural cardiovascular examination: Secondary | ICD-10-CM | POA: Insufficient documentation

## 2023-08-29 DIAGNOSIS — I483 Typical atrial flutter: Secondary | ICD-10-CM | POA: Insufficient documentation

## 2023-08-29 HISTORY — DX: Cerebral infarction, unspecified: I63.9

## 2023-08-29 HISTORY — DX: Hypothyroidism, unspecified: E03.9

## 2023-08-29 HISTORY — DX: Sleep apnea, unspecified: G47.30

## 2023-08-31 ENCOUNTER — Ambulatory Visit (HOSPITAL_COMMUNITY): Admitting: Anesthesiology

## 2023-08-31 ENCOUNTER — Encounter (HOSPITAL_COMMUNITY): Payer: Self-pay | Admitting: Internal Medicine

## 2023-08-31 ENCOUNTER — Ambulatory Visit (HOSPITAL_COMMUNITY)
Admission: RE | Admit: 2023-08-31 | Discharge: 2023-08-31 | Disposition: A | Discharge status: Home / Self Care | Source: Ambulatory Visit | Attending: Internal Medicine | Admitting: Internal Medicine

## 2023-08-31 ENCOUNTER — Other Ambulatory Visit: Payer: Self-pay

## 2023-08-31 ENCOUNTER — Encounter (HOSPITAL_COMMUNITY)
Admission: RE | Disposition: A | Discharge status: Home / Self Care | Payer: Self-pay | Source: Ambulatory Visit | Attending: Internal Medicine

## 2023-08-31 ENCOUNTER — Ambulatory Visit (HOSPITAL_BASED_OUTPATIENT_CLINIC_OR_DEPARTMENT_OTHER): Admitting: Anesthesiology

## 2023-08-31 ENCOUNTER — Telehealth: Payer: Self-pay

## 2023-08-31 DIAGNOSIS — K5730 Diverticulosis of large intestine without perforation or abscess without bleeding: Secondary | ICD-10-CM

## 2023-08-31 DIAGNOSIS — Z7901 Long term (current) use of anticoagulants: Secondary | ICD-10-CM | POA: Insufficient documentation

## 2023-08-31 DIAGNOSIS — K642 Third degree hemorrhoids: Secondary | ICD-10-CM | POA: Diagnosis not present

## 2023-08-31 DIAGNOSIS — K644 Residual hemorrhoidal skin tags: Secondary | ICD-10-CM | POA: Insufficient documentation

## 2023-08-31 DIAGNOSIS — K317 Polyp of stomach and duodenum: Secondary | ICD-10-CM | POA: Diagnosis not present

## 2023-08-31 DIAGNOSIS — Z8673 Personal history of transient ischemic attack (TIA), and cerebral infarction without residual deficits: Secondary | ICD-10-CM | POA: Diagnosis not present

## 2023-08-31 DIAGNOSIS — I4891 Unspecified atrial fibrillation: Secondary | ICD-10-CM | POA: Diagnosis not present

## 2023-08-31 DIAGNOSIS — R1310 Dysphagia, unspecified: Secondary | ICD-10-CM | POA: Diagnosis not present

## 2023-08-31 DIAGNOSIS — Z79890 Hormone replacement therapy (postmenopausal): Secondary | ICD-10-CM | POA: Insufficient documentation

## 2023-08-31 DIAGNOSIS — G4730 Sleep apnea, unspecified: Secondary | ICD-10-CM | POA: Insufficient documentation

## 2023-08-31 DIAGNOSIS — K254 Chronic or unspecified gastric ulcer with hemorrhage: Secondary | ICD-10-CM | POA: Diagnosis present

## 2023-08-31 DIAGNOSIS — E039 Hypothyroidism, unspecified: Secondary | ICD-10-CM | POA: Diagnosis not present

## 2023-08-31 DIAGNOSIS — I252 Old myocardial infarction: Secondary | ICD-10-CM | POA: Insufficient documentation

## 2023-08-31 DIAGNOSIS — Z7982 Long term (current) use of aspirin: Secondary | ICD-10-CM | POA: Diagnosis not present

## 2023-08-31 DIAGNOSIS — I10 Essential (primary) hypertension: Secondary | ICD-10-CM | POA: Insufficient documentation

## 2023-08-31 SURGERY — COLONOSCOPY
Anesthesia: General

## 2023-08-31 MED ORDER — PROPOFOL 500 MG/50ML IV EMUL
INTRAVENOUS | Status: DC | PRN
  Administered 2023-08-31: 150 ug/kg/min via INTRAVENOUS

## 2023-08-31 MED ORDER — LIDOCAINE 2% (20 MG/ML) 5 ML SYRINGE
INTRAMUSCULAR | Status: DC | PRN
Start: 2023-08-31 — End: 2023-08-31
  Administered 2023-08-31: 80 mg via INTRAVENOUS

## 2023-08-31 MED ORDER — LACTATED RINGERS IV SOLN: INTRAVENOUS | Status: DC

## 2023-08-31 MED ORDER — PANTOPRAZOLE SODIUM 40 MG PO TBEC: 40.0000 mg | DELAYED_RELEASE_TABLET | Freq: Two times a day (BID) | ORAL | 11 refills | Status: AC

## 2023-08-31 MED ORDER — PROPOFOL 10 MG/ML IV BOLUS
INTRAVENOUS | Status: DC | PRN
  Administered 2023-08-31: 50 mg via INTRAVENOUS

## 2023-08-31 MED ORDER — STERILE WATER FOR IRRIGATION IR SOLN
Status: DC | PRN
  Administered 2023-08-31 (×2): 60 mL

## 2023-08-31 MED ORDER — PHENYLEPHRINE 80 MCG/ML (10ML) SYRINGE FOR IV PUSH (FOR BLOOD PRESSURE SUPPORT)
PREFILLED_SYRINGE | INTRAVENOUS | Status: DC | PRN
  Administered 2023-08-31: 160 ug via INTRAVENOUS

## 2023-08-31 NOTE — Anesthesia Preprocedure Evaluation (Addendum)
 Anesthesia Evaluation  Patient identified by MRN, date of birth, ID band Patient awake    Reviewed: Allergy & Precautions, H&P , NPO status , Patient's Chart, lab work & pertinent test results  Airway Mallampati: II  TM Distance: >3 FB Neck ROM: Full    Dental no notable dental hx.    Pulmonary sleep apnea    Pulmonary exam normal breath sounds clear to auscultation       Cardiovascular hypertension, + Past MI  Normal cardiovascular exam+ dysrhythmias Atrial Fibrillation  Rhythm:Regular Rate:Normal  Hx Takotsubo CMP; most recent EF 2024 65-70%   Neuro/Psych CVA  negative psych ROS   GI/Hepatic Neg liver ROS,GERD  ,,  Endo/Other  Hypothyroidism    Renal/GU negative Renal ROS  negative genitourinary   Musculoskeletal negative musculoskeletal ROS (+)    Abdominal   Peds negative pediatric ROS (+)  Hematology negative hematology ROS (+)   Anesthesia Other Findings   Reproductive/Obstetrics negative OB ROS                             Anesthesia Physical Anesthesia Plan  ASA: 3  Anesthesia Plan: General   Post-op Pain Management:    Induction: Intravenous  PONV Risk Score and Plan:   Airway Management Planned: Nasal Cannula  Additional Equipment:   Intra-op Plan:   Post-operative Plan:   Informed Consent: I have reviewed the patients History and Physical, chart, labs and discussed the procedure including the risks, benefits and alternatives for the proposed anesthesia with the patient or authorized representative who has indicated his/her understanding and acceptance.     Dental advisory given  Plan Discussed with: CRNA  Anesthesia Plan Comments:        Anesthesia Quick Evaluation

## 2023-08-31 NOTE — Transfer of Care (Signed)
 Immediate Anesthesia Transfer of Care Note  Patient: Renee Benitez  Procedure(s) Performed: COLONOSCOPY EGD (ESOPHAGOGASTRODUODENOSCOPY) DILATION, ESOPHAGUS  Patient Location: Short Stay  Anesthesia Type:General  Level of Consciousness: awake, alert , oriented, and patient cooperative  Airway & Oxygen Therapy: Patient Spontanous Breathing  Post-op Assessment: Report given to RN, Post -op Vital signs reviewed and stable, and Patient moving all extremities X 4  Post vital signs: Reviewed and stable  Last Vitals:  Vitals Value Taken Time  BP 117/50 08/31/23 1224  Temp 36.6 C 08/31/23 1224  Pulse 66 08/31/23 1224  Resp 16 08/31/23 1224  SpO2 99 % 08/31/23 1224    Last Pain:  Vitals:   08/31/23 1224  TempSrc: Oral  PainSc: 0-No pain      Patients Stated Pain Goal: 7 (08/31/23 1039)  Complications: No notable events documented.

## 2023-08-31 NOTE — Telephone Encounter (Signed)
-----   Message from Garnette Ka sent at 08/31/2023 12:34 PM EDT -----  increase pantoprazole  to 40 mg twice daily.  Dispense 60 with 11 refills  [facility] hemorrhoid cream-plain.  Apply pea-sized amount to the anorectum 4 times daily.  1 refill.  Thanks

## 2023-08-31 NOTE — Telephone Encounter (Signed)
-----   Message from Garnette Ka sent at 08/31/2023  2:25 PM EDT -----   She had   A double today.  I forgot to add fiber recommendations on her discharge  stuff.  Could you let her know that I want her to start Benefiber 1 tablespoon daily for 3 weeks then increase to 2 tablespoons daily thereafter.

## 2023-08-31 NOTE — H&P (Signed)
 @LOGO @   Primary Care Physician:  Dorena Gander, MD Primary Gastroenterologist:  Dr. Riley Cheadle  Pre-Procedure History & Physical: HPI:  Renee Benitez is a 73 y.o. female here for  e.g. with possible esophageal dilation due to recurrent esophageal dysphagia and diagnostic colonoscopy for rectal bleeding.  Eliquis  held x 1 day   Per cardiology recommendations.  History of atrial fibrillation and CVA.  Past Medical History:  Diagnosis Date   Atrial fibrillation [facility])    Current use of estrogen therapy 01/31/2013   Dysrhythmia    AFib   Hypertension    Hypothyroidism    Macular degeneration    Menopausal syndrome    Overactive bladder    Right ovarian cyst 01/25/2014   Sleep apnea    Stroke [facility])     Past Surgical History:  Procedure Laterality Date   A-FLUTTER ABLATION N/A 03/18/2021   Procedure: A-FLUTTER ABLATION;  Surgeon: Tammie Fall, MD;  Location: [facility] INVASIVE CV LAB;  Service: Cardiovascular;  Laterality: N/A;   ABDOMINAL HYSTERECTOMY     APPENDECTOMY     ATRIAL FIBRILLATION ABLATION N/A 11/05/2022   Procedure: ATRIAL FIBRILLATION ABLATION;  Surgeon: Efraim Grange, MD;  Location: [facility] INVASIVE CV LAB;  Service: Cardiovascular;  Laterality: N/A;   CARDIOVERSION N/A 09/14/2016   Procedure: CARDIOVERSION;  Surgeon: Gerard Knight, MD;  Location: [facility] ORS;  Service: Cardiovascular;  Laterality: N/A;   CARDIOVERSION N/A 02/09/2021   Procedure: CARDIOVERSION;  Surgeon: Loyde Rule, MD;  Location: [facility] ENDOSCOPY;  Service: Cardiovascular;  Laterality: N/A;   CARDIOVERSION N/A 12/07/2022   Procedure: CARDIOVERSION;  Surgeon: Maudine Sos, MD;  Location: [facility] INVASIVE CV LAB;  Service: Cardiovascular;  Laterality: N/A;   CATARACT EXTRACTION W/ INTRAOCULAR LENS  IMPLANT, BILATERAL Bilateral    COLONOSCOPY  12/04/2003   Friable anal canal otherwise normal rectum and colon   COLONOSCOPY N/A 03/19/2013   Dr. Riley Cheadle: diverticulosis, hemorrhoids. next TCS 10 years    COLONOSCOPY N/A 05/02/2019   entire colon normal, non-bleeding external and internal hemorrhoids, grade 3. 5 year screening due to family history.   ESOPHAGOGASTRODUODENOSCOPY N/A 10/17/2018   moderately severe erosive esophagitis, esophagus status post dilation, small hiatal hernia, otherwise normal   IR ANGIO INTRA EXTRACRAN SEL INTERNAL CAROTID BILAT MOD SED  06/16/2021   IR ANGIO VERTEBRAL SEL VERTEBRAL UNI R MOD SED  06/16/2021   IR CT HEAD LTD  01/05/2021   IR CT HEAD LTD  01/05/2021   IR INTRA CRAN STENT  01/05/2021   IR PERCUTANEOUS ART THROMBECTOMY/INFUSION INTRACRANIAL INC DIAG ANGIO  01/05/2021   IR US  GUIDE VASC ACCESS LEFT  01/05/2021   IR US  GUIDE VASC ACCESS RIGHT  06/16/2021   MALONEY DILATION N/A 10/17/2018   Procedure: Londa Rival DILATION;  Surgeon: Suzette Espy, MD;  Location: [facility] ENDO SUITE;  Service: Endoscopy;  Laterality: N/A;   RADIOLOGY WITH ANESTHESIA N/A 01/05/2021   Procedure: IR WITH ANESTHESIA;  Surgeon: Radiologist, Medication, MD;  Location: [facility] OR;  Service: Radiology;  Laterality: N/A;   RIGHT/LEFT HEART CATH AND CORONARY ANGIOGRAPHY N/A 05/20/2022   Procedure: RIGHT/LEFT HEART CATH AND CORONARY ANGIOGRAPHY;  Surgeon: Avanell Leigh, MD;  Location: [facility] INVASIVE CV LAB;  Service: Cardiovascular;  Laterality: N/A;   TEE WITHOUT CARDIOVERSION N/A 09/14/2016   Procedure: TRANSESOPHAGEAL ECHOCARDIOGRAM (TEE) WITH PROPOFOL ;  Surgeon: Gerard Knight, MD;  Location: [facility] ORS;  Service: Cardiovascular;  Laterality: N/A;   TONSILECTOMY, ADENOIDECTOMY, BILATERAL MYRINGOTOMY AND TUBES      Prior to  Admission medications   Medication Sig Start Date End Date Taking? Authorizing Provider  acetaminophen  (TYLENOL ) 500 MG tablet Take 500-1,000 mg by mouth as needed for moderate pain or headache.   Yes [provider]  aspirin  81 MG EC tablet Take 1 tablet (81 mg total) by mouth daily. Patient taking differently: Take 81 mg by mouth at bedtime. 01/15/21  Yes  Bailey-Modzik, Delila A, NP  Calcium  Carb-Cholecalciferol  (CALCIUM  600 + D PO) Take 1 tablet by mouth at bedtime.   Yes [provider]  Cholecalciferol  (DIALYVITE VITAMIN D  5000) 125 MCG (5000 UT) capsule Take 5,000 Units by mouth at bedtime.   Yes [provider]  ELIQUIS  5 MG TABS tablet TAKE ONE TABLET (5MG  TOTAL) BY MOUTH TWOTIMES DAILY 04/01/23  Yes Branch, Joyceann No, MD  ENTRESTO  24-26 MG TAKE ONE TABLET BY MOUTH TWICE A DAY DISCONTINUE LOSARTAN  06/06/23  Yes Branch, Joyceann No, MD  escitalopram  (LEXAPRO ) 20 MG tablet Take 20 mg by mouth daily. 04/15/21  Yes [provider]  famotidine (PEPCID) 20 MG tablet Take 20 mg by mouth daily as needed for heartburn or indigestion.   Yes [provider]  levothyroxine  (SYNTHROID ) 100 MCG tablet Take 100 mcg by mouth daily at 2 am. 01/13/22  Yes [provider]  loratadine  (CLARITIN ) 10 MG tablet Take 10 mg by mouth daily as needed for allergies.   Yes [provider]  Magnesium  400 MG CAPS Take 400 mg by mouth at bedtime. magnesium  citrate   Yes [provider]  Multiple Vitamins-Minerals (MULTIVITAMIN WITH MINERALS) tablet Take 1 tablet by mouth in the morning. Women's One A Day   Yes [provider]  Multiple Vitamins-Minerals (PRESERVISION AREDS 2) CAPS Take 1 capsule by mouth 2 (two) times daily.   Yes [provider]  Omega-3 Fatty Acids (FISH OIL  ULTRA) 1400 MG CAPS Take 1,400 mg by mouth in the morning.   Yes [provider]  pantoprazole  (PROTONIX ) 40 MG tablet TAKE ONE TABLET (40MG  TOTAL) BY MOUTH DAILY 08/18/23  Yes Delman Ferns, NP  rosuvastatin  (CRESTOR ) 20 MG tablet Take 20 mg by mouth in the morning. 01/20/21  Yes [provider]  simethicone  (MYLICON) 80 MG chewable tablet Chew 80 mg by mouth as needed for flatulence.   Yes [provider]  empagliflozin  (JARDIANCE ) 10 MG TABS tablet Take 1 tablet (10 mg total) by mouth daily. 08/08/23    Laurann Pollock, MD  Metamucil Fiber CHEW Chew 1 tablet by mouth daily as needed (Constipation).    [provider]    Allergies as of 08/04/2023   (No Known Allergies)    Family History  Problem Relation Age of Onset   Coronary artery disease Father    Cancer Father        bladder cancer   Arrhythmia Sister        Reportedly had ablation of SVT   Hypertension Sister    Colon cancer Sister 73       s/p surgical resection, no chemo/radiation   Colon cancer Maternal Grandmother    Hypertension Son     Social History   Socioeconomic History   Marital status: Married    Spouse name: Not on file   Number of children: Not on file   Years of education: Not on file   Highest education level: Not on file  Occupational History   Occupation: Nurse    Comment: [employer]  Tobacco Use   Smoking status:  Never   Smokeless tobacco: Never   Tobacco comments:    Never smoked 11/29/22  Vaping Use   Vaping status: Never Used  Substance and Sexual Activity   Alcohol use: Not Currently    Comment: previous occasional, now none   Drug use: No   Sexual activity: Yes    Birth control/protection: Surgical    Comment: hyst  Other Topics Concern   Not on file  Social History Narrative   Not on file   Social Drivers of Health   Financial Resource Strain: Low Risk  (05/20/2022)   Overall Financial Resource Strain (CARDIA)    Difficulty of Paying Living Expenses: Not very hard  Food Insecurity: No Food Insecurity (05/19/2022)   Hunger Vital Sign    Worried About Running Out of Food in the Last Year: Never true    Ran Out of Food in the Last Year: Never true  Transportation Needs: No Transportation Needs (05/19/2022)   PRAPARE - Administrator, Civil Service (Medical): No    Lack of Transportation (Non-Medical): No  Physical Activity: Insufficiently Active (07/16/2020)   Exercise Vital Sign    Days of Exercise per Week: 1 day    Minutes of Exercise per  Session: 30 min  Stress: Stress Concern Present (07/16/2020)   [employer] of Occupational Health - Occupational Stress Questionnaire    Feeling of Stress : To some extent  Social Connections: Socially Integrated (07/16/2020)   Social Connection and Isolation Panel [NHANES]    Frequency of Communication with Friends and Family: Three times a week    Frequency of Social Gatherings with Friends and Family: Twice a week    Attends Religious Services: More than 4 times per year    Active Member of [employer] or Organizations: Yes    Attends Engineer, structural: More than 4 times per year    Marital Status: Married  Catering manager Violence: Not At Risk (05/19/2022)   Humiliation, Afraid, Rape, and Kick questionnaire    Fear of Current or Ex-Partner: No    Emotionally Abused: No    Physically Abused: No    Sexually Abused: No    Review of Systems: See HPI, otherwise negative ROS  Physical Exam: BP (!) 145/80   Pulse 65   Temp 98 F (36.7 C) (Oral)   Resp 18   Ht 5' 6.5" (1.689 m)   Wt 89.9 kg   SpO2 100%   BMI 31.51 kg/m  General:   Alert,  Well-developed, well-nourished, pleasant and cooperative in NAD Neck:  Supple; no masses or thyromegaly. No significant cervical adenopathy. Lungs:  Clear throughout to auscultation.   No wheezes, crackles, or rhonchi. No acute distress. Heart:  Regular rate and rhythm; no murmurs, clicks, rubs,  or gallops. Abdomen: Non-distended, normal bowel sounds.  Soft and nontender without appreciable mass or hepatosplenomegaly.    Impression/Plan:    73 year old lady with recurrent esophageal dysphagia here for dilation is feasible/appropriate per plan.  History of rectal bleeding colonoscopy to be formed today as a diagnostic maneuver per plan.  The risks, benefits, limitations, imponderables and alternatives regarding both EGD and colonoscopy have been reviewed with the patient. Questions have been answered. All parties agreeable.     Eliquis   held x 1 day per cardiology recommendations     Notice: This dictation was prepared with Dragon dictation along with smaller phrase technology. Any transcriptional errors that result from this process are unintentional and may not be corrected upon review.

## 2023-08-31 NOTE — Op Note (Addendum)
 [facility] Patient Name: Renee Benitez Procedure Date: 08/31/2023 11:12 AM MRN: [id number] Date of Birth: 17-Sep-1950 Attending MD: Gemma Kelp , MD, [id number] CSN: [id number] Age: 73 Admit Type: Outpatient Procedure:                Upper GI endoscopy Indications:              Dysphagia Providers:                Gemma Kelp, MD, Alisa App, Crystal                            Page, Annell Barrow Referring MD:              Medicines:                Propofol  per Anesthesia Complications:            No immediate complications. Estimated Blood Loss:     Estimated blood loss was minimal. Procedure:                After obtaining informed consent, the endoscope was                            passed under direct vision. Throughout the                            procedure, the patient's blood pressure, pulse, and                            oxygen saturations were monitored continuously. The                            [id number]-[id number] ([id number]) scope was introduced through the                            mouth, and advanced to the second part of duodenum. Scope In: 11:46:51 AM Scope Out: 12:01:41 PM Total Procedure Duration: 0 hours 14 minutes 50 seconds  Findings:      The examined esophagus was normal. The scope was withdrawn. Dilation was       performed with a Maloney dilator with mild resistance at 54 Fr. The       dilation site was examined following endoscope reinsertion and showed no       change. Estimated blood loss was minimal.      patient had scattered diminutive 1 to 2 mm polyps. In the antrum there       were two 6 mm linear ulcers. Please see photos. No obvious infiltrating       process. Pylorus patent      The duodenal bulb and second portion of the duodenum were normal.      Biopsies of the gastric ulcer taken for histologic study. Slight oozing       following this maneuver. hemostasis with pure a stat. 1 resolution clip       placed on the larger  ulcer crater. Finally, gastric polyp was removed       with cold biopsy forceps. Impression:               - Normal esophagus. Dilated. 2 small elliptical  gastric ulcers likely benign?status post biopsy.                            Status post. Stat placement and clip x 1                           - gastric polyp biopsy x 1                           - Normal duodenal bulb and second portion of the                            duodenum.                           - Moderate Sedation:      Moderate (conscious) sedation was personally administered by an       anesthesia professional. The following parameters were monitored: oxygen       saturation, heart rate, blood pressure, respiratory rate, EKG, adequacy       of pulmonary ventilation, and response to care. Recommendation:           - Patient has a contact number available for                            emergencies. The signs and symptoms of potential                            delayed complications were discussed with the                            patient. Return to normal activities tomorrow.                            Written discharge instructions were provided to the                            patient.                           - Advance diet as tolerated. increase Protonix  to                            40 mg twice daily. Follow-up on pathology. May                            resume Eliquis  tonight. Procedure Code(s):        --- Professional ---                           [id number], Esophagogastroduodenoscopy, flexible,                            transoral; diagnostic, including collection of  specimen(s) by brushing or washing, when performed                            (separate procedure)                           43450, Dilation of esophagus, by unguided sound or                            bougie, single or multiple passes Diagnosis Code(s):        --- Professional ---                            R13.10, Dysphagia, unspecified CPT copyright 2022 American Medical Association. All rights reserved. The codes documented in this report are preliminary and upon coder review may  be revised to meet current compliance requirements. Windsor Hatcher. Aquilla Voiles, MD Gemma Kelp, MD 08/31/2023 1:12:25 PM This report has been signed electronically. Number of Addenda: 0

## 2023-08-31 NOTE — Anesthesia Postprocedure Evaluation (Signed)
 Anesthesia Post Note  Patient: Renee Benitez  Procedure(s) Performed: COLONOSCOPY EGD (ESOPHAGOGASTRODUODENOSCOPY) DILATION, ESOPHAGUS  Patient location during evaluation: PACU Anesthesia Type: General Level of consciousness: awake and alert Pain management: pain level controlled Vital Signs Assessment: post-procedure vital signs reviewed and stable Respiratory status: spontaneous breathing, nonlabored ventilation, respiratory function stable and patient connected to nasal cannula oxygen Cardiovascular status: stable and blood pressure returned to baseline Postop Assessment: no apparent nausea or vomiting Anesthetic complications: no  No notable events documented.   Last Vitals:  Vitals:   08/31/23 1039 08/31/23 1224  BP: (!) 145/80 (!) 117/50  Pulse: 65 66  Resp: 18 16  Temp: 36.7 C 36.6 C  SpO2: 100% 99%    Last Pain:  Vitals:   08/31/23 1224  TempSrc: Oral  PainSc: 0-No pain                 Beacher Limerick

## 2023-08-31 NOTE — Op Note (Signed)
 [facility] Patient Name: Renee Benitez Procedure Date: 08/31/2023 11:11 AM MRN: [id number] Date of Birth: 1950/11/29 Attending MD: Gemma Kelp , MD, [id number] CSN: [id number] Age: 73 Admit Type: Outpatient Procedure:                Colonoscopy Indications:              Hematochezia Providers:                Gemma Kelp, MD, Alisa App, Crystal                            Page, Annell Barrow Referring MD:              Medicines:                Propofol  per Anesthesia Complications:            No immediate complications. Estimated Blood Loss:     Estimated blood loss: none. Procedure:                Pre-Anesthesia Assessment:                           - Prior to the procedure, a History and Physical                            was performed, and patient medications and                            allergies were reviewed. The patient's tolerance of                            previous anesthesia was also reviewed. The risks                            and benefits of the procedure and the sedation                            options and risks were discussed with the patient.                            All questions were answered, and informed consent                            was obtained. Prior Anticoagulants: The patient                            last took Eliquis  (apixaban ) 1 day prior to the                            procedure. ASA Grade Assessment: III - A patient                            with severe systemic disease. After reviewing the  risks and benefits, the patient was deemed in                            satisfactory condition to undergo the procedure.                           After obtaining informed consent, the colonoscope                            was passed under direct vision. Throughout the                            procedure, the patient's blood pressure, pulse, and                            oxygen saturations  were monitored continuously. The                            [id number]) scope was introduced through                            the anus and advanced to the the cecum, identified                            by appendiceal orifice and ileocecal valve. The                            colonoscopy was performed without difficulty. The                            patient tolerated the procedure well. The quality                            of the bowel preparation was adequate. The                            ileocecal valve, appendiceal orifice, and rectum                            were photographed. The colonoscopy was performed                            without difficulty. The patient tolerated the                            procedure well. The quality of the bowel                            preparation was adequate. The entire colon was well                            visualized. Scope In: 12:06:14 PM Scope Out: 12:19:15 PM Scope Withdrawal Time: 0 hours 6 minutes 37 seconds  Total Procedure Duration: 0 hours 13 minutes 1  second  Findings:      Hemorrhoids were found on perianal exam.      Scattered medium-mouthed diverticula were found in the sigmoid colon.      Non-bleeding external and internal hemorrhoids were found during       retroflexion. The hemorrhoids were moderate, medium-sized and Grade III       (internal hemorrhoids that prolapse but require manual reduction).      The exam was otherwise without abnormality on direct and retroflexion       views. Impression:               - Hemorrhoids found on perianal exam.                           - Diverticulosis in the sigmoid colon.                           - Non-bleeding external and internal hemorrhoids.                           - The examination was otherwise normal on direct                            and retroflexion views.                           - No specimens collected. I suspect bleeding from                             hemorrhoids. Irregularity due to lack of fiber. Moderate Sedation:      Moderate (conscious) sedation was personally administered by an       anesthesia professional. The following parameters were monitored: oxygen       saturation, heart rate, blood pressure, respiratory rate, EKG, adequacy       of pulmonary ventilation, and response to care. Recommendation:           Not a good hemorrhoid banding candidate due to need                            for uninterrupted anticoagulation.- Repeat                            colonoscopy in 5 years for screening purposes.                           - Return to GI office in 6 weeks.                           - Repeat colonoscopy in 5 years for screening                            purposes. Begin Benefiber 1 tablespoon daily for 3                            weeks and increase to 2 tablespoons daily  thereafter. Caroline apothecary cream?"plain 30 g                            pea-sized amount to the anorectum 4 times daily as                            needed                           - see EGD report. resume Eliquis  today. Procedure Code(s):        --- Professional ---                           [id number], Colonoscopy, flexible; diagnostic, including                            collection of specimen(s) by brushing or washing,                            when performed (separate procedure) Diagnosis Code(s):        --- Professional ---                           K64.2, Third degree hemorrhoids                           K92.1, Melena (includes Hematochezia)                           K57.30, Diverticulosis of large intestine without                            perforation or abscess without bleeding CPT copyright 2022 American Medical Association. All rights reserved. The codes documented in this report are preliminary and upon coder review may  be revised to meet current compliance requirements. Renee Benitez. Renee Hellwig, MD Gemma Kelp, MD 08/31/2023 1:23:28 PM This report has been signed electronically. Number of Addenda: 0

## 2023-08-31 NOTE — Discharge Instructions (Signed)
 EGD Discharge instructions Please read the instructions outlined below and refer to this sheet in the next few weeks. These discharge instructions provide you with general information on caring for yourself after you leave the hospital. Your doctor may also give you specific instructions. While your treatment has been planned according to the most current medical practices available, unavoidable complications occasionally occur. If you have any problems or questions after discharge, please call your doctor. ACTIVITY You may resume your regular activity but move at a slower pace for the next 24 hours.  Take frequent rest periods for the next 24 hours.  Walking will help expel (get rid of) the air and reduce the bloated feeling in your abdomen.  No driving for 24 hours (because of the anesthesia (medicine) used during the test).  You may shower.  Do not sign any important legal documents or operate any machinery for 24 hours (because of the anesthesia used during the test).  NUTRITION Drink plenty of fluids.  You may resume your normal diet.  Begin with a light meal and progress to your normal diet.  Avoid alcoholic beverages for 24 hours or as instructed by your caregiver.  MEDICATIONS You may resume your normal medications unless your caregiver tells you otherwise.  WHAT YOU CAN EXPECT TODAY You may experience abdominal discomfort such as a feeling of fullness or "gas" pains.  FOLLOW-UP Your doctor will discuss the results of your test with you.  SEEK IMMEDIATE MEDICAL ATTENTION IF ANY OF THE FOLLOWING OCCUR: Excessive nausea (feeling sick to your stomach) and/or vomiting.  Severe abdominal pain and distention (swelling).  Trouble swallowing.  Temperature over 101 F (37.8 C).  Rectal bleeding or vomiting of blood.    Colonoscopy Discharge Instructions  Read the instructions outlined below and refer to this sheet in the next few weeks. These discharge instructions provide you with  general information on caring for yourself after you leave the hospital. Your doctor may also give you specific instructions. While your treatment has been planned according to the most current medical practices available, unavoidable complications occasionally occur. If you have any problems or questions after discharge, call Dr. Riley Cheadle at [phone]. ACTIVITY You may resume your regular activity, but move at a slower pace for the next 24 hours.  Take frequent rest periods for the next 24 hours.  Walking will help get rid of the air and reduce the bloated feeling in your belly (abdomen).  No driving for 24 hours (because of the medicine (anesthesia) used during the test).   Do not sign any important legal documents or operate any machinery for 24 hours (because of the anesthesia used during the test).  NUTRITION Drink plenty of fluids.  You may resume your normal diet as instructed by your doctor.  Begin with a light meal and progress to your normal diet. Heavy or fried foods are harder to digest and may make you feel sick to your stomach (nauseated).  Avoid alcoholic beverages for 24 hours or as instructed.  MEDICATIONS You may resume your normal medications unless your doctor tells you otherwise.  WHAT YOU CAN EXPECT TODAY Some feelings of bloating in the abdomen.  Passage of more gas than usual.  Spotting of blood in your stool or on the toilet paper.  IF YOU HAD POLYPS REMOVED DURING THE COLONOSCOPY: No aspirin  products for 7 days or as instructed.  No alcohol for 7 days or as instructed.  Eat a soft diet for the next 24 hours.  FINDING  OUT THE RESULTS OF YOUR TEST Not all test results are available during your visit. If your test results are not back during the visit, make an appointment with your caregiver to find out the results. Do not assume everything is normal if you have not heard from your caregiver or the medical facility. It is important for you to follow up on all of your test  results.  SEEK IMMEDIATE MEDICAL ATTENTION IF: You have more than a spotting of blood in your stool.  Your belly is swollen (abdominal distention).  You are nauseated or vomiting.  You have a temperature over 101.  You have abdominal pain or discomfort that is severe or gets worse throughout the day.       your esophagus was dilated today.  You had 2 gastric ulcers of uncertain significance.  Biopsies taken.    Increase pantoprazole  Protonix  to 40 mg twice daily 30 minutes for breakfast and supper.  New prescription being called into your pharmacy from my office   hemorrhoids found on colonoscopy he also have diverticulosis.  No polyp no biopsies needed  no biopsies performed.    New prescription for [facility] hemorrhoid cream dispense 30 g apply pea-sized amount to the anorectum times a day as needed  Begin a fiber supplement with Benefiber 1 tablespoon daily for 3 weeks; then increase to 2 tablespoons daily thereafter  Further recommendations to follow once biopsy report returns  Office visit with me in 6 weeks   resume Eliquis  tonight

## 2023-08-31 NOTE — Telephone Encounter (Signed)
 Rx pantoprazole  sent to pharmacy on file. Rx hemorrhoid cream called in to [facility].

## 2023-08-31 NOTE — Telephone Encounter (Signed)
 Pt was made aware and verbalized understanding.

## 2023-09-01 ENCOUNTER — Encounter (HOSPITAL_COMMUNITY): Payer: Self-pay | Admitting: Internal Medicine

## 2023-09-02 ENCOUNTER — Ambulatory Visit: Payer: Self-pay | Admitting: Internal Medicine

## 2023-09-02 LAB — SURGICAL PATHOLOGY

## 2023-09-13 ENCOUNTER — Ambulatory Visit: Admitting: Gastroenterology

## 2023-09-13 ENCOUNTER — Encounter: Payer: Self-pay | Admitting: Gastroenterology

## 2023-09-13 VITALS — BP 116/75 | HR 65 | Temp 98.2°F | Ht 66.5 in | Wt 199.4 lb

## 2023-09-13 DIAGNOSIS — K219 Gastro-esophageal reflux disease without esophagitis: Secondary | ICD-10-CM

## 2023-09-13 DIAGNOSIS — K649 Unspecified hemorrhoids: Secondary | ICD-10-CM

## 2023-09-13 DIAGNOSIS — Z800 Family history of malignant neoplasm of digestive organs: Secondary | ICD-10-CM

## 2023-09-13 DIAGNOSIS — K259 Gastric ulcer, unspecified as acute or chronic, without hemorrhage or perforation: Secondary | ICD-10-CM | POA: Diagnosis not present

## 2023-09-13 DIAGNOSIS — K625 Hemorrhage of anus and rectum: Secondary | ICD-10-CM | POA: Diagnosis not present

## 2023-09-13 NOTE — Progress Notes (Signed)
 Gastroenterology Office Note     Primary Care Physician:  Dorena Gander, MD  Primary Gastroenterologist: Dr. Riley Cheadle    Chief Complaint   Chief Complaint  Patient presents with   Follow-up    Follow up on rectal bleeding, GERD and dysphagia. Pt states all of it is better     History of Present Illness   Renee Benitez is a delightful 73 y.o. female presenting today with a history of chronic GERD, rectal bleeding, change in bowel habits, last seen in April 2025. She underwent EGD due to dysphagia and rectal bleeding in May 2025 and is here for follow-up.  Colonoscopy with internal hemorrhoids and diverticulosis. EGD with normal esophagus s/p dilation. 2 small elliptical benign-appearing gastric ulcers were biopsied, along with gastric polyp biopsy. Negative H.pylori on path.   She feels great. Taking pantoprazole  BID. Dysphagia resolved. BM once to twice a day. Benefiber has been helpful. Rare rectal bleeding. Much improved. No rectal itching or burning. No abdominal pain.    81 mg aspirin . No other NSAIDs.   She has no other concerns today.     EGD May 2025: Normal esophagus. Dilated. 2 small elliptical                            gastric ulcers likely benign?status post biopsy.                            Status post. Stat placement and clip x 1                           - gastric polyp biopsy x 1                           - Normal duodenal bulb and second portion of the                            duodenum. Negative H.pylori. Fundic polyp    Colonoscopy May 2025: Hemorrhoids found on perianal exam.                           - Diverticulosis in the sigmoid colon.                           - Non-bleeding external and internal hemorrhoids.                           - The examination was otherwise normal on direct                            and retroflexion views.                           - No specimens collected. I suspect bleeding from                             hemorrhoids. Irregularity due to lack of fiber.  Past Medical History:  Diagnosis Date   Atrial fibrillation [facility])    Current use of estrogen therapy  01/31/2013   Dysrhythmia    AFib   Hypertension    Hypothyroidism    Macular degeneration    Menopausal syndrome    Overactive bladder    Right ovarian cyst 01/25/2014   Sleep apnea    Stroke [facility])     Past Surgical History:  Procedure Laterality Date   A-FLUTTER ABLATION N/A 03/18/2021   Procedure: A-FLUTTER ABLATION;  Surgeon: Tammie Fall, MD;  [facility]: [facility] INVASIVE CV LAB;  Service: Cardiovascular;  Laterality: N/A;   ABDOMINAL HYSTERECTOMY     APPENDECTOMY     ATRIAL FIBRILLATION ABLATION N/A 11/05/2022   Procedure: ATRIAL FIBRILLATION ABLATION;  Surgeon: Efraim Grange, MD;  Location: [facility] INVASIVE CV LAB;  Service: Cardiovascular;  Laterality: N/A;   CARDIOVERSION N/A 09/14/2016   Procedure: CARDIOVERSION;  Surgeon: Gerard Knight, MD;  Location: [facility] ORS;  Service: Cardiovascular;  Laterality: N/A;   CARDIOVERSION N/A 02/09/2021   Procedure: CARDIOVERSION;  Surgeon: Loyde Rule, MD;  Location: [facility] ENDOSCOPY;  Service: Cardiovascular;  Laterality: N/A;   CARDIOVERSION N/A 12/07/2022   Procedure: CARDIOVERSION;  Surgeon: Maudine Sos, MD;  Location: [facility] INVASIVE CV LAB;  Service: Cardiovascular;  Laterality: N/A;   CATARACT EXTRACTION W/ INTRAOCULAR LENS  IMPLANT, BILATERAL Bilateral    COLONOSCOPY  12/04/2003   Friable anal canal otherwise normal rectum and colon   COLONOSCOPY N/A 03/19/2013   Dr. Riley Cheadle: diverticulosis, hemorrhoids. next TCS 10 years   COLONOSCOPY N/A 05/02/2019   entire colon normal, non-bleeding external and internal hemorrhoids, grade 3. 5 year screening due to family history.   COLONOSCOPY N/A 08/31/2023   Procedure: COLONOSCOPY;  Surgeon: Suzette Espy, MD;  Location: [facility] ENDO SUITE;  Service: Endoscopy;  Laterality: N/A;  1:00 pm, asa 3   ESOPHAGEAL DILATION N/A 08/31/2023   Procedure:  DILATION, ESOPHAGUS;  Surgeon: Suzette Espy, MD;  Location: [facility] ENDO SUITE;  Service: Endoscopy;  Laterality: N/A;   ESOPHAGOGASTRODUODENOSCOPY N/A 10/17/2018   moderately severe erosive esophagitis, esophagus status post dilation, small hiatal hernia, otherwise normal   ESOPHAGOGASTRODUODENOSCOPY N/A 08/31/2023   Procedure: EGD (ESOPHAGOGASTRODUODENOSCOPY);  Surgeon: Suzette Espy, MD;  Location[facility] END[facility];  Service: Endoscopy;  Laterality: N/A;   IR ANGIO INTRA EXTRACRAN SEL INTERNAL CAROTID BILAT MOD SED  06/16/2021   IR ANGIO VERTEBRAL SEL VERTEBRAL UNI R MOD SED  06/16/2021   IR CT HEAD LTD  01/05/2021   IR CT HEAD LTD  01/05/2021   IR INTRA CRAN STENT  01/05/2021   IR PERCUTANEOUS ART THROMBECTOMY/INFUSION INTRACRANIAL INC DIAG ANGIO  01/05/2021   IR US  GUIDE VASC ACCESS LEFT  01/05/2021   IR US  GUIDE VASC ACCESS RIGHT  06/16/2021   MALONEY DILATION N/A 10/17/2018   Procedure: Londa Rival DILATION;  Surgeon: Suzette Espy, MD;  [facility];  Service: Endoscopy;  Laterality: N/A;   RADIOLOGY WITH ANESTHESIA N/A 01/05/2021   Procedure: IR WITH ANESTHESIA;  Surgeon: Radiologist, Medication, MD;  Location: [facility] OR;  Service: Radiology;  Laterality: N/A;   RIGHT/LEFT HEART CATH AND CORONARY ANGIOGRAPHY N/A 05/20/2022   Procedure: RIGHT/LEFT HEART CATH AND CORONARY ANGIOGRAPHY;  Surgeon: Avanell Leigh, MD;  [facility] INVASIVE CV LAB;  Service: Cardiovascular;  Laterality: N/A;   TEE WITHOUT CARDIOVERSION N/A 09/14/2016   Procedure: TRANSESOPHAGEAL ECHOCARDIOGRAM (TEE) WITH PROPOFOL ;  Surgeon: Gerard Knight, MD;  Location: [facility] ORS;  Service: Cardiovascular;  Laterality: N/A;   TONSILECTOMY, ADENOIDECTOMY, BILATERAL MYRINGOTOMY AND TUBES      Current Outpatient Medications  Medication Sig Dispense Refill   acetaminophen  (TYLENOL ) 500 MG tablet Take 500-1,000 mg by mouth as needed for moderate pain or headache.     aspirin  81 MG EC tablet Take 1 tablet (81 mg total) by  mouth daily. (Patient taking differently: Take 81 mg by mouth at bedtime.) 150 tablet 2   Calcium  Carb-Cholecalciferol  (CALCIUM  600 + D PO) Take 1 tablet by mouth at bedtime.     Cholecalciferol  (DIALYVITE VITAMIN D  5000) 125 MCG (5000 UT) capsule Take 5,000 Units by mouth at bedtime.     ELIQUIS  5 MG TABS tablet TAKE ONE TABLET (5MG  TOTAL) BY MOUTH TWOTIMES DAILY 180 tablet 1   empagliflozin  (JARDIANCE ) 10 MG TABS tablet Take 1 tablet (10 mg total) by mouth daily. 30 tablet 6   ENTRESTO  24-26 MG TAKE ONE TABLET BY MOUTH TWICE A DAY DISCONTINUE LOSARTAN  60 tablet 11   escitalopram  (LEXAPRO ) 20 MG tablet Take 20 mg by mouth daily.     famotidine (PEPCID) 20 MG tablet Take 20 mg by mouth daily as needed for heartburn or indigestion.     levothyroxine  (SYNTHROID ) 100 MCG tablet Take 100 mcg by mouth daily at 2 am.     loratadine  (CLARITIN ) 10 MG tablet Take 10 mg by mouth daily as needed for allergies.     Magnesium  400 MG CAPS Take 400 mg by mouth at bedtime. magnesium  citrate     Metamucil Fiber CHEW Chew 1 tablet by mouth daily as needed (Constipation).     Multiple Vitamins-Minerals (MULTIVITAMIN WITH MINERALS) tablet Take 1 tablet by mouth in the morning. Women's One A Day     Multiple Vitamins-Minerals (PRESERVISION AREDS 2) CAPS Take 1 capsule by mouth 2 (two) times daily.     Omega-3 Fatty Acids (FISH OIL  ULTRA) 1400 MG CAPS Take 1,400 mg by mouth in the morning.     pantoprazole  (PROTONIX ) 40 MG tablet Take 1 tablet (40 mg total) by mouth 2 (two) times daily before a meal. 60 tablet 11   rosuvastatin  (CRESTOR ) 20 MG tablet Take 20 mg by mouth in the morning.     simethicone  (MYLICON) 80 MG chewable tablet Chew 80 mg by mouth as needed for flatulence.     No current facility-administered medications for this visit.    Allergies as of 09/13/2023   (No Known Allergies)    Family History  Problem Relation Age of Onset   Coronary artery disease Father    Cancer Father        bladder  cancer   Arrhythmia Sister        Reportedly had ablation of SVT   Hypertension Sister    Colon cancer Sister 44       s/p surgical resection, no chemo/radiation   Colon cancer Maternal Grandmother    Hypertension Son     Social History   Socioeconomic History   Marital status: Married    Spouse name: Not on file   Number of children: Not on file   Years of education: Not on file   Highest education level: Not on file  Occupational History   Occupation: Nurse    Comment: [employer]  Tobacco Use   Smoking status: Never   Smokeless tobacco: Never   Tobacco comments:    Never smoked 11/29/22  Vaping Use   Vaping status: Never Used  Substance and Sexual Activity   Alcohol use: Not Currently    Comment: previous occasional, now none   Drug use: No  Sexual activity: Yes    Birth control/protection: Surgical    Comment: hyst  Other Topics Concern   Not on file  Social History Narrative   Not on file   Social Drivers of Health   Financial Resource Strain: Low Risk  (05/20/2022)   Overall Financial Resource Strain (CARDIA)    Difficulty of Paying Living Expenses: Not very hard  Food Insecurity: No Food Insecurity (05/19/2022)   Hunger Vital Sign    Worried About Running Out of Food in the Last Year: Never true    Ran Out of Food in the Last Year: Never true  Transportation Needs: No Transportation Needs (05/19/2022)   PRAPARE - Administrator, Civil Service (Medical): No    Lack of Transportation (Non-Medical): No  Physical Activity: Insufficiently Active (07/16/2020)   Exercise Vital Sign    Days of Exercise per Week: 1 day    Minutes of Exercise per Session: 30 min  Stress: Stress Concern Present (07/16/2020)   [employer] of Occupational Health - Occupational Stress Questionnaire    Feeling of Stress : To some extent  Social Connections: Socially Integrated (07/16/2020)   Social Connection and Isolation Panel [NHANES]    Frequency of Communication  with Friends and Family: Three times a week    Frequency of Social Gatherings with Friends and Family: Twice a week    Attends Religious Services: More than 4 times per year    Active Member of [employer] or Organizations: Yes    Attends Engineer, structural: More than 4 times per year    Marital Status: Married  Catering manager Violence: Not At Risk (05/19/2022)   Humiliation, Afraid, Rape, and Kick questionnaire    Fear of Current or Ex-Partner: No    Emotionally Abused: No    Physically Abused: No    Sexually Abused: No     Review of Systems   Gen: Denies any fever, chills, fatigue, weight loss, lack of appetite.  CV: Denies chest pain, heart palpitations, peripheral edema, syncope.  Resp: Denies shortness of breath at rest or with exertion. Denies wheezing or cough.  GI: Denies dysphagia or odynophagia. Denies jaundice, hematemesis, fecal incontinence. GU : Denies urinary burning, urinary frequency, urinary hesitancy MS: Denies joint pain, muscle weakness, cramps, or limitation of movement.  Derm: Denies rash, itching, dry skin Psych: Denies depression, anxiety, memory loss, and confusion Heme: Denies bruising, bleeding, and enlarged lymph nodes.   Physical Exam   BP 116/75   Pulse 65   Temp 98.2 F (36.8 C)   Ht 5' 6.5" (1.689 m)   Wt 199 lb 6.4 oz (90.4 kg)   BMI 31.70 kg/m  General:   Alert and oriented. Pleasant and cooperative. Well-nourished and well-developed.  Head:  Normocephalic and atraumatic. Eyes:  Without icterus Abdomen:  +BS, soft, non-tender and non-distended. No HSM noted. No guarding or rebound. No masses appreciated.  Rectal:  Deferred  Msk:  Symmetrical without gross deformities. Normal posture. Extremities:  Without edema. Neurologic:  Alert and  oriented x4;  grossly normal neurologically. Skin:  Intact without significant lesions or rashes. Psych:  Alert and cooperative. Normal mood and affect.   Assessment   Renee Benitez is a  delightful 73 y.o. female presenting today with a history of chronic GERD, rectal bleeding, change in bowel habits, last seen in April 2025. She underwent EGD due to dysphagia and rectal bleeding in May 2025 and is here for follow-up.  GERD: dysphagia improved with empiric  dilation. Doing well on PPI BID. As small gastric ulcers found, continue PPI BID another 4-6 weeks, then wean back down to once daily. No H.pylori. no NSAIDs other than 81 mg aspirin . Will discuss surveillance with Dr. Riley Cheadle.   Rectal bleeding: due to internal hemorrhoids. She has had significant improvement with fiber daily. No straining. Much improved. We can consider banding (but would be on Eliquis ) in future if recurrent bleeding.   FH colon cancer: next colonoscopy in 5 years.    PLAN    Continue pantoprazole  BID for another 4-6 weeks, then attempt to wean to once daily if tolerated. Discussing surveillance EGD with Dr. Riley Cheadle Continue Benefiber Colonoscopy in 5 years Reach out if further concerns or issues, otherwise 1 year return   Delman Ferns, PhD, [facility]

## 2023-09-13 NOTE — Patient Instructions (Signed)
 Continue pantoprazole  twice a day for another 4-6 weeks. After that, you can decrease this to once each morning, 30 minutes before breakfast. If you do not do well with this, we can increase back up to twice a day. Please keep me updated!  I will reach out to Dr. Riley Cheadle about the need for surveillance endoscopy.  We will see you in 1 year or sooner if needed!  I enjoyed seeing you again today! I value our relationship and want to provide genuine, compassionate, and quality care. You may receive a survey regarding your visit with me, and I welcome your feedback! Thanks so much for taking the time to complete this. I look forward to seeing you again.      Delman Ferns, PhD, ANP-BC [facility] Gastroenter[facility]

## 2023-09-21 ENCOUNTER — Other Ambulatory Visit: Payer: Self-pay | Admitting: Cardiology

## 2023-09-21 DIAGNOSIS — I4819 Other persistent atrial fibrillation: Secondary | ICD-10-CM

## 2023-09-21 NOTE — Telephone Encounter (Signed)
 Prescription refill request for Eliquis  received. Indication: AF Last office visit: 08/08/23  Jamas Maywood PA-C Scr: 1.07 on 11/29/22  Epic Age: 73 Weight: 89.9kg  Based on above findings Eliquis  5mg  twice daily is the appropriate dose.  Refill approved.

## 2023-11-28 ENCOUNTER — Telehealth: Payer: Self-pay | Admitting: Cardiology

## 2023-11-28 NOTE — Telephone Encounter (Signed)
   Pre-operative Risk Assessment    Patient Name: Renee Benitez  DOB: Jul 31, 1950 MRN: [id number]   Date of last office visit: 02/25/2023 Date of next office visit: TBD   Request for Surgical Clearance    Procedure:  Botox in the bladder   Date of Surgery:  Clearance 12/29/23                                Surgeon:  Dr. Marinell Surgeon's Group or Practice Name:  [facility]  Phone number:  [phone] EXT: [phone] Fax number:  [phone]   Type of Clearance Requested:   - Medical  - Pharmacy:  Hold Apixaban  (Eliquis ) 3 days prior    Type of Anesthesia:  Local    Additional requests/questions:    [name] [name] [name]   11/28/2023, 3:54 PM

## 2023-11-29 ENCOUNTER — Telehealth: Payer: Self-pay | Admitting: *Deleted

## 2023-11-29 DIAGNOSIS — Z01818 Encounter for other preprocedural examination: Secondary | ICD-10-CM

## 2023-11-29 NOTE — Telephone Encounter (Signed)
 Pt has been scheduled for tele preop appt 12/14/23 and will have labs done 11/30/23 at [facility], BMET/CBC.   Med rec and consent are done.      Patient Consent for Virtual Visit        Renee Benitez has provided verbal consent on 11/29/2023 for a virtual visit (video or telephone).   CONSENT FOR VIRTUAL VISIT FOR:  Renee Benitez  By participating in this virtual visit I agree to the following:  I hereby voluntarily request, consent and authorize [facility] and its employed or contracted physicians, physician assistants, nurse practitioners or other licensed health care professionals (the Practitioner), to provide me with telemedicine health care services (the "Services) as deemed necessary by the treating Practitioner. I acknowledge and consent to receive the Services by the Practitioner via telemedicine. I understand that the telemedicine visit will involve communicating with the Practitioner through live audiovisual communication technology and the disclosure of certain medical information by electronic transmission. I acknowledge that I have been given the opportunity to request an in-person assessment or other available alternative prior to the telemedicine visit and am voluntarily participating in the telemedicine visit.  I understand that I have the right to withhold or withdraw my consent to the use of telemedicine in the course of my care at any time, without affecting my right to future care or treatment, and that the Practitioner or I may terminate the telemedicine visit at any time. I understand that I have the right to inspect all information obtained and/or recorded in the course of the telemedicine visit and may receive copies of available information for a reasonable fee.  I understand that some of the potential risks of receiving the Services via telemedicine include:  Delay or interruption in medical evaluation due to technological equipment failure or  disruption; Information transmitted may not be sufficient (e.g. poor resolution of images) to allow for appropriate medical decision making by the Practitioner; and/or  In rare instances, security protocols could fail, causing a breach of personal health information.  Furthermore, I acknowledge that it is my responsibility to provide information about my medical history, conditions and care that is complete and accurate to the best of my ability. I acknowledge that Practitioner's advice, recommendations, and/or decision may be based on factors not within their control, such as incomplete or inaccurate data provided by me or distortions of diagnostic images or specimens that may result from electronic transmissions. I understand that the practice of medicine is not an exact science and that Practitioner makes no warranties or guarantees regarding treatment outcomes. I acknowledge that a copy of this consent can be made available to me via my patient portal [facility] MyChart), or I can request a printed copy by calling the office of [facility].    I understand that my insurance will be billed for this visit.   I have read or had this consent read to me. I understand the contents of this consent, which adequately explains the benefits and risks of the Services being provided via telemedicine.  I have been provided ample opportunity to ask questions regarding this consent and the Services and have had my questions answered to my satisfaction. I give my informed consent for the services to be provided through the use of telemedicine in my medical care

## 2023-11-29 NOTE — Telephone Encounter (Signed)
 Pt has been scheduled for tele preop appt 12/14/23 and will have labs done 11/30/23 at [facility], BMET/CBC.    Med rec and consent are done.

## 2023-11-29 NOTE — Telephone Encounter (Signed)
 Will forward back to preop APP to confirm if pt will need an appt, either in office or tele after labs have been done.

## 2023-11-30 ENCOUNTER — Other Ambulatory Visit (HOSPITAL_COMMUNITY)
Admission: RE | Admit: 2023-11-30 | Discharge: 2023-11-30 | Disposition: A | Discharge status: Home / Self Care | Source: Ambulatory Visit | Attending: Emergency Medicine | Admitting: Emergency Medicine

## 2023-11-30 DIAGNOSIS — Z01818 Encounter for other preprocedural examination: Secondary | ICD-10-CM | POA: Diagnosis present

## 2023-11-30 LAB — CBC
HCT: 44 % (ref 36.0–46.0)
Hemoglobin: 14.7 g/dL (ref 12.0–15.0)
MCH: 31.2 pg (ref 26.0–34.0)
MCHC: 33.4 g/dL (ref 30.0–36.0)
MCV: 93.4 fL (ref 80.0–100.0)
Platelets: 222 K/uL (ref 150–400)
RBC: 4.71 MIL/uL (ref 3.87–5.11)
RDW: 13 % (ref 11.5–15.5)
WBC: 7.8 K/uL (ref 4.0–10.5)
nRBC: 0 % (ref 0.0–0.2)

## 2023-11-30 LAB — BASIC METABOLIC PANEL WITH GFR
Anion gap: 12 (ref 5–15)
BUN: 18 mg/dL (ref 8–23)
CO2: 24 mmol/L (ref 22–32)
Calcium: 9.5 mg/dL (ref 8.9–10.3)
Chloride: 103 mmol/L (ref 98–111)
Creatinine, Ser: 1.15 mg/dL — ABNORMAL HIGH (ref 0.44–1.00)
GFR, Estimated: 51 mL/min — ABNORMAL LOW (ref >60)
Glucose, Bld: 91 mg/dL (ref 70–99)
Potassium: 4.5 mmol/L (ref 3.5–5.1)
Sodium: 139 mmol/L (ref 135–145)

## 2023-12-02 ENCOUNTER — Ambulatory Visit: Payer: Self-pay | Admitting: Emergency Medicine

## 2023-12-02 NOTE — Telephone Encounter (Signed)
 Patient with diagnosis of afib on Eliquis  for anticoagulation.    Procedure:  Botox in the bladder  Date of procedure: 12/29/23   CHA2DS2-VASc Score = 6   This indicates a 9.7% annual risk of stroke. The patient's score is based upon: CHF History: 0 HTN History: 1 Diabetes History: 0 Stroke History: 2 Vascular Disease History: 1 Age Score: 1 Gender Score: 1      CrCl 52 ml/min Platelet count 222  Patient has not had an Afib/aflutter ablation within the last 3 months or DCCV within the last 30 days  Urology understandably is requesting a 3 days hold. Due to pt hx of stroke on Eliquis , I will confirm with Dr. Nancey that a 3 day Eliquis  hold is ok.  **This guidance is not considered finalized until pre-operative APP has relayed final recommendations.**

## 2023-12-13 NOTE — Progress Notes (Unsigned)
 Virtual Visit via Telephone Note   Because of [name] co-morbid illnesses, she is at least at moderate risk for complications without adequate follow up.  This format is felt to be most appropriate for this patient at this time.  Due to technical limitations with video connection (technology), today's appointment will be conducted as an audio only telehealth visit, and [name] verbally agreed to proceed in this manner.   All issues noted in this document were discussed and addressed.  No physical exam could be performed with this format.  Evaluation Performed:  Preoperative cardiovascular risk assessment _____________   Date:  12/13/2023   Patient ID:  Renee Benitez, DOB 1951/01/05, MRN [id number] Patient Location:  Home Provider location:   Office  Primary Care Provider:  Rolinda Millman, MD Primary Cardiologist:  Alvan Carrier, MD  Chief Complaint / Patient Profile   73 y.o. y/o female with a h/o hypertension, paroxysmal atrial fibrillation, NSTEMI, hyperlipidemia who is pending Botox in the bladder and presents today for telephonic preoperative cardiovascular risk assessment.  History of Present Illness    Renee Benitez is a 73 y.o. female who presents via audio/video conferencing for a telehealth visit today.  Pt was last seen in cardiology clinic on 02/25/2023 by Dr. Alvan.  At that time DAYA DUTT was doing well .  The patient is now pending procedure as outlined above. Since her last visit, she remained stable from a cardiac standpoint.  Today she denies chest pain, shortness of breath, lower extremity edema, fatigue, palpitations, melena, hematuria, hemoptysis, diaphoresis, weakness, presyncope, syncope, orthopnea, and PND.   Past Medical History    Past Medical History:  Diagnosis Date   Atrial fibrillation [facility])    Current use of estrogen therapy 01/31/2013   Dysrhythmia    AFib   Hypertension    Hypothyroidism    Macular degeneration     Menopausal syndrome    Overactive bladder    Right ovarian cyst 01/25/2014   Sleep apnea    Stroke [facility])    Past Surgical History:  Procedure Laterality Date   A-FLUTTER ABLATION N/A 03/18/2021   Procedure: A-FLUTTER ABLATION;  Surgeon: Waddell Danelle ORN, MD;  Location: [facility] INVASIVE CV LAB;  Service: Cardiovascular;  Laterality: N/A;   ABDOMINAL HYSTERECTOMY     APPENDECTOMY     ATRIAL FIBRILLATION ABLATION N/A 11/05/2022   Procedure: ATRIAL FIBRILLATION ABLATION;  Surgeon: Nancey Eulas BRAVO, MD;  Location: [facility] INVASIVE CV LAB;  Service: Cardiovascular;  Laterality: N/A;   CARDIOVERSION N/A 09/14/2016   Procedure: CARDIOVERSION;  Surgeon: Debera Jayson MATSU, MD;  Location: [facility] ORS;  Service: Cardiovascular;  Laterality: N/A;   CARDIOVERSION N/A 02/09/2021   Procedure: CARDIOVERSION;  Surgeon: Delford Maude BROCKS, MD;  Location: [facility] ENDOSCOPY;  Service: Cardiovascular;  Laterality: N/A;   CARDIOVERSION N/A 12/07/2022   Procedure: CARDIOVERSION;  Surgeon: Raford Riggs, MD;  Location: [facility] INVASIVE CV LAB;  Service: Cardiovascular;  Laterality: N/A;   CATARACT EXTRACTION W/ INTRAOCULAR LENS  IMPLANT, BILATERAL Bilateral    COLONOSCOPY  12/04/2003   Friable anal canal otherwise normal rectum and colon   COLONOSCOPY N/A 03/19/2013   Dr. Shaaron: diverticulosis, hemorrhoids. next TCS 10 years   COLONOSCOPY N/A 05/02/2019   entire colon normal, non-bleeding external and internal hemorrhoids, grade 3. 5 year screening due to family history.   COLONOSCOPY N/A 08/31/2023   Procedure: COLONOSCOPY;  Surgeon: Shaaron Lamar HERO, MD;  Location: [facility] ENDO SUITE;  Service: Endoscopy;  Laterality: N/A;  1:00 pm, asa 3   ESOPHAGEAL DILATION N/A 08/31/2023   Procedure: DILATION, ESOPHAGUS;  Surgeon: Shaaron Lamar HERO, MD;  Location: [facility] ENDO SUITE;  Service: Endoscopy;  Laterality: N/A;   ESOPHAGOGASTRODUODENOSCOPY N/A 10/17/2018   moderately severe erosive esophagitis, esophagus status post dilation, small hiatal hernia,  otherwise normal   ESOPHAGOGASTRODUODENOSCOPY N/A 08/31/2023   Procedure: EGD (ESOPHAGOGASTRODUODENOSCOPY);  Surgeon: Shaaron Lamar HERO, MD;  Location: [facility] ENDO [facility];  Service: Endoscopy;  Laterality: N/A;   IR ANGIO INTRA EXTRACRAN SEL INTERNAL CAROTID BILAT MOD SED  06/16/2021   IR ANGIO VERTEBRAL SEL VERTEBRAL UNI R MOD SED  06/16/2021   IR CT HEAD LTD  01/05/2021   IR CT HEAD LTD  01/05/2021   IR INTRA CRAN STENT  01/05/2021   IR PERCUTANEOUS ART THROMBECTOMY/INFUSION INTRACRANIAL INC DIAG ANGIO  01/05/2021   IR US  GUIDE VASC ACCESS LEFT  01/05/2021   IR US  GUIDE VASC ACCESS RIGHT  06/16/2021   MALONEY DILATION N/A 10/17/2018   Procedure: AGAPITO DILATION;  Surgeon: Shaaron Lamar HERO, MD;  Location: [facility] END[facility];  Service: Endoscopy;  Laterality: N/A;   RADIOLOGY WITH ANESTHESIA N/A 01/05/2021   Procedure: IR WITH ANESTHESIA;  Surgeon: Radiologist, Medication, MD;  Location: [facility] OR;  Service: Radiology;  Laterality: N/A;   RIGHT/LEFT HEART CATH AND CORONARY ANGIOGRAPHY N/A 05/20/2022   Procedure: RIGHT/LEFT HEART CATH AND CORONARY ANGIOGRAPHY;  Surgeon: Court Dorn PARAS, MD;  Location: [facility] INVASIVE CV LAB;  Service: Cardiovascular;  Laterality: N/A;   TEE WITHOUT CARDIOVERSION N/A 09/14/2016   Procedure: TRANSESOPHAGEAL ECHOCARDIOGRAM (TEE) WITH PROPOFOL ;  Surgeon: Debera Jayson MATSU, MD;  Location: [facility] ORS;  Service: Cardiovascular;  Laterality: N/A;   TONSILECTOMY, ADENOIDECTOMY, BILATERAL MYRINGOTOMY AND TUBES      Allergies  No Known Allergies  Home Medications    Prior to Admission medications   Medication Sig Start Date End Date Taking? Authorizing Provider  acetaminophen  (TYLENOL ) 500 MG tablet Take 500-1,000 mg by mouth as needed for moderate pain or headache.    [provider]  aspirin  81 MG EC tablet Take 1 tablet (81 mg total) by mouth daily. Patient taking differently: Take 81 mg by mouth at bedtime. 01/15/21   Bailey-Modzik, Delila A, NP  Calcium   Carb-Cholecalciferol  (CALCIUM  600 + D PO) Take 1 tablet by mouth at bedtime.    [provider]  Cholecalciferol  (DIALYVITE VITAMIN D  5000) 125 MCG (5000 UT) capsule Take 5,000 Units by mouth at bedtime.    [provider]  ELIQUIS  5 MG TABS tablet TAKE ONE TABLET (5MG  TOTAL) BY MOUTH TWOTIMES DAILY 09/21/23   Alvan Dorn FALCON, MD  empagliflozin  (JARDIANCE ) 10 MG TABS tablet Take 1 tablet (10 mg total) by mouth daily. 08/08/23   Alvan Dorn FALCON, MD  ENTRESTO  24-26 MG TAKE ONE TABLET BY MOUTH TWICE A DAY DISCONTINUE LOSARTAN  06/06/23   Alvan Dorn FALCON, MD  escitalopram  (LEXAPRO ) 20 MG tablet Take 20 mg by mouth daily. 04/15/21   [provider]  famotidine (PEPCID) 20 MG tablet Take 20 mg by mouth daily as needed for heartburn or indigestion.    [provider]  levothyroxine  (SYNTHROID ) 100 MCG tablet Take 100 mcg by mouth daily at 2 am. 01/13/22   [provider]  loratadine  (CLARITIN ) 10 MG tablet Take 10 mg by mouth daily as needed for allergies.    [provider]  Magnesium  400 MG CAPS Take 400 mg by mouth at bedtime. magnesium  citrate    [provider]  Metamucil Fiber CHEW Chew 1 tablet by mouth daily as needed (Constipation).    [provider]  Multiple Vitamins-Minerals (MULTIVITAMIN WITH MINERALS) tablet Take 1 tablet by mouth in the morning. Women's One A Day    [provider]  Multiple Vitamins-Minerals (PRESERVISION AREDS 2) CAPS Take 1 capsule by mouth 2 (two) times daily.    [provider]  Omega-3 Fatty Acids (FISH OIL  ULTRA) 1400 MG CAPS Take 1,400 mg by mouth in the morning.    [provider]  pantoprazole  (PROTONIX ) 40 MG tablet Take 1 tablet (40 mg total) by mouth 2 (two) times daily before a meal. 08/31/23   Rourk, Lamar HERO, MD  rosuvastatin  (CRESTOR ) 20 MG tablet Take 20 mg by mouth in the morning. 01/20/21   [provider]  simethicone  (MYLICON) 80 MG chewable  tablet Chew 80 mg by mouth as needed for flatulence.    [provider]    Physical Exam    Vital Signs:  LESSLY STIGLER does not have vital signs available for review today.  Given telephonic nature of communication, physical exam is limited. AAOx3. NAD. Normal affect.  Speech and respirations are unlabored.  Accessory Clinical Findings    None  Assessment & Plan    1.  Preoperative Cardiovascular Risk Assessment:Botox in the bladder    Date of Surgery:  Clearance 12/29/23                                  Surgeon:  Dr. Marinell Surgeon's Group or Practice Name:  [facility]  Phone number:  [phone] EXT: [phone] Fax number:  [phone]      Primary Cardiologist: Alvan Carrier, MD  Chart reviewed as part of pre-operative protocol coverage. Given past medical history and time since last visit, based on ACC/AHA guidelines, KANESHIA CATER would be at acceptable risk for the planned procedure without further cardiovascular testing.   Her RCRI is high risk, greater than 11% risk of major cardiac event.  She is able to complete greater than 4 METS of physical activity.  Patient was advised that if she develops new symptoms prior to surgery to contact our office to arrange a follow-up appointment.  He verbalized understanding.  Patient has not had an Afib/aflutter ablation within the last 3 months or DCCV within the last 30 days   Her Eliquis  may be held for 3 days prior to her procedure.  Please resume as soon as hemostasis is achieved.  I will route this recommendation to the requesting party via Epic fax function and remove from pre-op pool.       Time:   Today, I have spent 5 minutes with the patient with telehealth technology discussing medical history, symptoms, and management plan.  I spent 10 minutes reviewing patient's past cardiac history and cardiac medications.    Josefa HERO [place], NP  12/13/2023, 12:38 PM

## 2023-12-14 ENCOUNTER — Ambulatory Visit: Attending: Cardiovascular Disease

## 2023-12-14 DIAGNOSIS — Z01810 Encounter for preprocedural cardiovascular examination: Secondary | ICD-10-CM

## 2024-01-06 ENCOUNTER — Other Ambulatory Visit (HOSPITAL_COMMUNITY): Payer: Self-pay | Admitting: Family Medicine

## 2024-01-06 DIAGNOSIS — Z1231 Encounter for screening mammogram for malignant neoplasm of breast: Secondary | ICD-10-CM

## 2024-01-11 ENCOUNTER — Encounter (HOSPITAL_COMMUNITY): Payer: Self-pay

## 2024-01-11 ENCOUNTER — Ambulatory Visit (HOSPITAL_COMMUNITY)
Admission: RE | Admit: 2024-01-11 | Discharge: 2024-01-11 | Disposition: A | Discharge status: Home / Self Care | Source: Ambulatory Visit | Attending: Family Medicine | Admitting: Family Medicine

## 2024-01-11 DIAGNOSIS — Z1231 Encounter for screening mammogram for malignant neoplasm of breast: Secondary | ICD-10-CM | POA: Diagnosis present

## 2024-01-18 NOTE — Progress Notes (Unsigned)
 Electrophysiology Office Note:    Date:  01/19/2024   ID:  Renee Benitez, DOB 1951/04/04, MRN [id number]  PCP:  Rolinda Millman, MD   [facility] Providers Cardiologist:  Alvan Carrier, MD Electrophysiologist:  Eulas FORBES Furbish, MD     Referring MD: Rolinda Millman, MD   History of Present Illness:    Renee Benitez is a 73 y.o. female with a hx listed below, significant for atrial fibrillation and flutter, Takotsubo cardiomyopathy with recovered EF referred for arrhythmia management.  She has a history of both typical and atypical flutter, both of which were successfully ablated. She has done well on flecainide , but this was discontinued in February when she was admitted for NSTEMI.  She discussed management options with her primary EP, Dr. Waddell and she expressed a preference for ablation rather than an alternative medication.  She underwent ablation of atrial fibrillation on November 05, 2022. She had recurrence of atrial fibrillation within a month of the ablation and underwent amiodarone  load and cardioversion on December 07, 2022.  She reports that she has maintained normal rhythm since cardioversion.  Bradycardia has been an issue for her, with rates sometimes as low as the 30s while at rest.  This seems to have improved since she discontinued amiodarone  on October 5, when she ran out of the medication.  Her metoprolol  dose has been decreased, and she is currently taking 12.5 mg every other evening.  She has not had syncope, presyncope.   EKGs/Labs/Other Studies Reviewed Today:    Echocardiogram:  TTE 05/21/2022  1. Anteroseptal wall is akinetic except very small area at the base. The  mid to distal inferoseptum, anterolateral, anterior walls are akinetic.  Apex is akinetic. If coronary disease excluded pattern would fit Takotsubo  cardiomyopathy. . Left  ventricular ejection fraction, by estimation, is 30 to 35%. The left  ventricle has moderately decreased  function. The left ventricle  demonstrates regional wall motion abnormalities (see scoring  diagram/findings for description). There is mild left  ventricular hypertrophy. Left ventricular diastolic parameters are  consistent with Grade II diastolic dysfunction (pseudonormalization).  Elevated left atrial pressure.   2. Right ventricular systolic function is normal. The right ventricular  size is normal. There is mildly elevated pulmonary artery systolic  pressure.   3. Left atrial size was mildly dilated.   4. The mitral valve is abnormal. Mild mitral valve regurgitation.   5. The tricuspid valve is abnormal.   6. The aortic valve is tricuspid. Aortic valve regurgitation is mild. No  aortic stenosis is present.   7. The inferior vena cava is normal in size with greater than 50%  respiratory variability, suggesting right atrial pressure of 3 mmHg.   TTE 06/18/2022 1. Left ventricular ejection fraction, by estimation, is 65 to 70% . The left ventricle has normal function. The left ventricle has no regional wall motion abnormalities. Left ventricular diastolic function could not be evaluated. 2. The inferior vena cava is dilated in size with > 50% respiratory variability, suggesting right atrial pressure of 8 mmHg.  Monitors:   Stress testing:   Advanced imaging:    EKG:   EKG Interpretation Date/Time:  Thursday January 19 2024 10:57:10 EDT Ventricular Rate:  59 PR Interval:  190 QRS Duration:  86 QT Interval:  404 QTC Calculation: 399 R Axis:   8  Text Interpretation: Sinus bradycardia When compared with ECG of 29-Aug-2023 11:54, Premature atrial complexes are no longer Present QRS axis Shifted right Confirmed by Sameeha Rockefeller,  Eulas [phone]) on 01/19/2024 11:10:40 AM     Recent Labs: 11/30/2023: BUN 18; Creatinine, Ser 1.15; Hemoglobin 14.7; Platelets 222; Potassium 4.5; Sodium 139     Physical Exam:    VS:  BP (!) 140/82 (BP Location: Left Arm)   Pulse (!) 59   Ht 5' 6.5  (1.689 m)   Wt 201 lb 3.2 oz (91.3 kg)   SpO2 98%   BMI 31.99 kg/m     Wt Readings from Last 3 Encounters:  01/19/24 201 lb 3.2 oz (91.3 kg)  09/13/23 199 lb 6.4 oz (90.4 kg)  08/31/23 198 lb 3.2 oz (89.9 kg)     GEN: Well nourished, well developed in no acute distress CARDIAC: iRRR, no murmurs, rubs, gallops RESPIRATORY:  Normal work of breathing MUSCULOSKELETAL: no edema    ASSESSMENT & PLAN:    Persistent atrial fibrillation Symptomatic with fatigue, senses an irregular heart rhythm Maintaining sinus rhythm since recovering from the ablation Amiodarone  discontinued 01/15/2023   History of typical and atypical atrial flutters No evidence of recurrence after ablation 2022  Secondary hypercoagulable state Continue Apixaban  5mg   CHFrEF EF 30-35% on 05/19/2022 recovered on 06/18/22 TTE  Sinus bradycardia Appears to be resolved off of metoprolol  and amiodarone        Medication Adjustments/Labs and Tests Ordered: Current medicines are reviewed at length with the patient today.  Concerns regarding medicines are outlined above.  Orders Placed This Encounter  Procedures   EKG 12-Lead   No orders of the defined types were placed in this encounter.    Signed, Eulas FORBES Furbish, MD  01/19/2024 11:10 AM    [facility]

## 2024-01-19 ENCOUNTER — Encounter: Payer: Self-pay | Admitting: Cardiovascular Disease

## 2024-01-19 ENCOUNTER — Ambulatory Visit: Attending: Cardiovascular Disease | Admitting: Cardiovascular Disease

## 2024-01-19 ENCOUNTER — Ambulatory Visit: Admitting: Cardiovascular Disease

## 2024-01-19 VITALS — BP 140/82 | HR 59 | Ht 66.5 in | Wt 201.2 lb

## 2024-01-19 DIAGNOSIS — I4819 Other persistent atrial fibrillation: Secondary | ICD-10-CM | POA: Diagnosis not present

## 2024-01-19 NOTE — Patient Instructions (Addendum)
Medication Instructions:  Continue all current medications.  Labwork: none  Testing/Procedures: none  Follow-Up: 6 months - afib clinic   Any Other Special Instructions Will Be Listed Below (If Applicable).   If you need a refill on your cardiac medications before your next appointment, please call your pharmacy.

## 2024-02-02 ENCOUNTER — Other Ambulatory Visit (HOSPITAL_COMMUNITY): Payer: Self-pay | Admitting: Radiology

## 2024-02-08 ENCOUNTER — Other Ambulatory Visit (HOSPITAL_COMMUNITY): Payer: Self-pay | Admitting: Radiology

## 2024-02-08 DIAGNOSIS — I63439 Cerebral infarction due to embolism of unspecified posterior cerebral artery: Secondary | ICD-10-CM

## 2024-02-22 DIAGNOSIS — I5022 Chronic systolic (congestive) heart failure: Secondary | ICD-10-CM | POA: Insufficient documentation

## 2024-02-22 DIAGNOSIS — Z9862 Peripheral vascular angioplasty status: Secondary | ICD-10-CM | POA: Insufficient documentation

## 2024-02-22 DIAGNOSIS — Z7901 Long term (current) use of anticoagulants: Secondary | ICD-10-CM | POA: Insufficient documentation

## 2024-02-22 DIAGNOSIS — E559 Vitamin D deficiency, unspecified: Secondary | ICD-10-CM | POA: Insufficient documentation

## 2024-02-22 DIAGNOSIS — J302 Other seasonal allergic rhinitis: Secondary | ICD-10-CM | POA: Insufficient documentation

## 2024-02-22 DIAGNOSIS — E039 Hypothyroidism, unspecified: Secondary | ICD-10-CM | POA: Insufficient documentation

## 2024-02-22 DIAGNOSIS — E7800 Pure hypercholesterolemia, unspecified: Secondary | ICD-10-CM | POA: Insufficient documentation

## 2024-02-22 DIAGNOSIS — D6869 Other thrombophilia: Secondary | ICD-10-CM | POA: Insufficient documentation

## 2024-02-22 DIAGNOSIS — I749 Embolism and thrombosis of unspecified artery: Secondary | ICD-10-CM | POA: Insufficient documentation

## 2024-02-22 DIAGNOSIS — F411 Generalized anxiety disorder: Secondary | ICD-10-CM | POA: Insufficient documentation

## 2024-02-22 DIAGNOSIS — I4892 Unspecified atrial flutter: Secondary | ICD-10-CM | POA: Insufficient documentation

## 2024-02-22 DIAGNOSIS — E66811 Obesity, class 1: Secondary | ICD-10-CM | POA: Insufficient documentation

## 2024-02-27 ENCOUNTER — Ambulatory Visit: Admitting: Neuroradiology

## 2024-02-27 ENCOUNTER — Encounter: Payer: Self-pay | Admitting: Neuroradiology

## 2024-02-27 VITALS — BP 137/80 | HR 66 | Ht 66.0 in | Wt 201.0 lb

## 2024-02-27 DIAGNOSIS — I4891 Unspecified atrial fibrillation: Secondary | ICD-10-CM

## 2024-02-27 DIAGNOSIS — Z7901 Long term (current) use of anticoagulants: Secondary | ICD-10-CM

## 2024-02-27 DIAGNOSIS — I63431 Cerebral infarction due to embolism of right posterior cerebral artery: Secondary | ICD-10-CM

## 2024-02-27 DIAGNOSIS — Z95828 Presence of other vascular implants and grafts: Secondary | ICD-10-CM | POA: Diagnosis not present

## 2024-02-27 NOTE — Progress Notes (Signed)
 I had the pleasure of meeting Renee Benitez in the office today.  Briefly, she had a new diagnosis of atrial fibrillation and suffered a right posterior cerebral artery occlusion which was treated endovascularly on 01/05/2021.  Despite the embolic appearance of the occlusion and new diagnosis of atrial fibrillation, embolectomy was not successful and an Atlas stent was placed by Dr. Evern.  The patient suffered only a very small right thalamic stroke, no occipital cortical stroke.  Her visual fields are intact.  She had a digital subtraction angiogram 06/16/2021 and an MR angiogram 12/16/2022 both of which show the stent is patent.  Currently she is on aspirin  and Eliquis , the latter for her diagnosis of atrial fibrillation.  She is a lifetime non-smoker.  She does not have hypertension or diabetes.  And does not have known atherosclerotic vascular disease.  I asked her detailed questions regarding stroke and TIA and she does not have any concerning neurologic symptoms.  Assessment:  1.  Previous embolic stroke in the right posterior cerebral artery due to atrial fibrillation 01/05/2021 2.  Placement of right posterior cerebral artery stent 01/05/2021  Recommendation:  No routine imaging follow-up needed 2.   Unless she has a cardiac indication for aspirin , the 81 mg aspirin  can be stopped.

## 2024-03-01 ENCOUNTER — Ambulatory Visit: Admitting: Neuroradiology

## 2024-03-07 NOTE — Progress Notes (Signed)
 Renee Benitez                                          MRN: [id number]   03/07/2024   The VBCI Quality Team Specialist reviewed this patient medical record for the purposes of chart review for care gap closure. The following were reviewed: chart review for care gap closure-glycemic status assessment.    VBCI Quality Team

## 2024-03-13 ENCOUNTER — Other Ambulatory Visit: Payer: Self-pay | Admitting: Cardiology

## 2024-03-26 ENCOUNTER — Other Ambulatory Visit (HOSPITAL_COMMUNITY): Payer: Self-pay | Admitting: *Deleted

## 2024-03-26 ENCOUNTER — Other Ambulatory Visit: Payer: Self-pay | Admitting: Cardiology

## 2024-03-26 DIAGNOSIS — I4819 Other persistent atrial fibrillation: Secondary | ICD-10-CM

## 2024-03-26 MED ORDER — APIXABAN 5 MG PO TABS: 5.0000 mg | ORAL_TABLET | Freq: Two times a day (BID) | ORAL | 1 refills | Status: DC

## 2024-04-16 ENCOUNTER — Other Ambulatory Visit: Payer: Self-pay | Admitting: Cardiology

## 2024-05-10 NOTE — Progress Notes (Signed)
 "  Cardiology Office Note    Date:  05/11/2024  ID:  Renee Benitez, DOB 1951/02/24, MRN [id number] Cardiologist: Alvan Carrier, MD Electrophysiologist:  Eulas FORBES Furbish, MD { : History of Present Illness:    Renee Benitez is a 74 y.o. female with past medical history of paroxysmal atrial fibrillation/flutter (prior atrial flutter ablation in 03/2021 and atrial fibrillation ablation in 10/2022), prior CVA requiring mechanical thrombectomy with stenting and angioplasty in 12/2020), CAD (minimal CAD by catheterization in 05/2022), Takotsubo cardiomyopathy/HFimpEF (EF 30-35% in 05/2022 and normalized by repeat echo in 06/2022), aortic regurgitation, HTN, HLD and OSA who presents to the office today for routine follow-up.  She was last examined by Dr. Alvan in 02/2023 and denied any recent chest pain or dyspnea on exertion at that time. She was maintaining normal sinus rhythm and had been continued on both ASA and Eliquis  per Neurology given her prior CVA with stenting. She was also continued on Jardiance  10 mg daily, Crestor  20 mg daily and Entresto  24-26 mg twice daily. She did have a follow-up visit with Dr. Furbish in 01/2024 and reported episodes of bradycardia at times with heart rate in the 30's at rest and Metoprolol  was fully discontinued.  In talking with the patient today, she reports overall doing well since her last office visit. She does not exercise routinely but says she remains active in doing chores around her home and walks her dog several times a day (Australian Shepherd named [place]). She denies any recent chest pain or palpitations. Has a smart watch and says that she has overall been maintaining normal sinus rhythm when checking an EKG on this. Denies any specific orthopnea or PND.  Does have occasional lower extremity edema but this typically occurs at the end of the day and no persistent symptoms. Remains on ASA and Eliquis  with no reports of active bleeding.  Studies  Reviewed:   EKG: EKG is not ordered today.  Cardiac Catheterization: 05/2022   Prox LAD lesion is 25% stenosed.   There is severe left ventricular systolic dysfunction.   LV end diastolic pressure is moderately elevated.   The left ventricular ejection fraction is less than 25% by visual estimate.  HEMODYNAMICS:    1: Right atrial pressure-14/12 2: Right ventricular pressure-50/3 3: Pulmonary artery pressure-46/28, mean 23 4: Pulmonary wedge pressure-A-wave 23, V wave 31, mean 22 5: LVEDP-19 6: Cardiac output-4.3 L/min with an index of 2.16 L/min/m.   IMPRESSION: Renee Benitez  has essentially clean coronary arteries and severe LV dysfunction with elevated enzymes consistent with Takotsubo syndrome.  She will need guideline directed optimal medical therapy.  Given her history of embolic stroke she will need to be started back on her Eliquis  later today.  The sheath was removed and a TR band was placed on the right wrist to achieve patent hemostasis.  The patient left lab in stable condition.  Dr. Loni  her attending cardiologist, was notified of these results.  Limited Echo: 06/2022 IMPRESSIONS     1. Left ventricular ejection fraction, by estimation, is 65 to 70%. The  left ventricle has normal function. The left ventricle has no regional  wall motion abnormalities. Left ventricular diastolic function could not  be evaluated.   2. The inferior vena cava is dilated in size with >50% respiratory  variability, suggesting right atrial pressure of 8 mmHg.   Comparison(s): Changes from prior study are noted. LVEF improved from  30-35% with RWMA on 05/19/2022 to normal LVEF with no  RWMA now.    Physical Exam:   VS:  BP 110/64 (BP Location: Left Arm, Cuff Size: Normal)   Pulse 65   Ht 5' 6.5 (1.689 m)   Wt 201 lb (91.2 kg)   SpO2 96%   BMI 31.96 kg/m    Wt Readings from Last 3 Encounters:  05/11/24 201 lb (91.2 kg)  02/27/24 201 lb (91.2 kg)  01/19/24 201 lb 3.2 oz (91.3  kg)     GEN: Well nourished, well developed female appearing in no acute distress NECK: No JVD; No carotid bruits CARDIAC: RRR, no murmurs, rubs, gallops RESPIRATORY:  Clear to auscultation without rales, wheezing or rhonchi  ABDOMEN: Appears non-distended. No obvious abdominal masses. EXTREMITIES: No clubbing or cyanosis. No pitting edema.  Distal pedal pulses are 2+ bilaterally.   Assessment and Plan:   1. Takotsubo cardiomyopathy - She previously had stress-induced cardiomyopathy in 05/2022 with EF reduced at 30 to 35% and follow-up imaging in 06/2022 showed her EF had normalized to 65 to 70%. - She denies any recent respiratory issues and appears euvolemic by examination today. Continue current medical therapy with Jardiance  10 mg daily and Entresto  24-26 mg twice daily. I encouraged her to make us  aware if Entresto  is not covered well by her insurance as this can be switched to Losartan . She is no longer on beta-blocker therapy given bradycardia with this in the past.   2. Persistent atrial fibrillation /flutter - She has a history of both atrial fibrillation and atrial flutter and underwent atrial flutter ablation in 03/2021 and atrial fibrillation ablation in 10/2022. Was on Amiodarone  but this has since been discontinued by EP.  - She denies any recent palpitations and is in NSR by examination today.  - Continue Eliquis  5 mg twice daily for anticoagulation which is the correct dose given her current age (74 yo), weight (201 lbs) and renal function (creatinine at 1.15 when checked in 11/2023). CBC in 11/2023 showed her hemoglobin was stable at 14.7 with platelets at 222 K.   3. Coronary artery disease involving native coronary artery of native heart without angina pectoris - Cardiac catheterization in 05/2022 showed 25% proximal LAD stenosis but otherwise no significant CAD. She denies any recent anginal symptoms.  - She has remained on ASA 81 mg daily per Neurology along with Crestor   20 mg daily.  4. Hyperlipidemia LDL goal <70 - Followed by her PCP. LDL was at 71 when checked in 10/2022. Continue current medical therapy with Crestor  20 mg daily.  Signed, Laymon CHRISTELLA Qua, PA-C   "

## 2024-05-11 ENCOUNTER — Ambulatory Visit: Attending: Student | Admitting: Student

## 2024-05-11 ENCOUNTER — Encounter: Payer: Self-pay | Admitting: Student

## 2024-05-11 VITALS — BP 110/64 | HR 65 | Ht 66.5 in | Wt 201.0 lb

## 2024-05-11 DIAGNOSIS — E785 Hyperlipidemia, unspecified: Secondary | ICD-10-CM

## 2024-05-11 DIAGNOSIS — I5181 Takotsubo syndrome: Secondary | ICD-10-CM

## 2024-05-11 DIAGNOSIS — I4819 Other persistent atrial fibrillation: Secondary | ICD-10-CM | POA: Diagnosis not present

## 2024-05-11 DIAGNOSIS — I2510 Atherosclerotic heart disease of native coronary artery without angina pectoris: Secondary | ICD-10-CM | POA: Diagnosis not present

## 2024-05-11 MED ORDER — APIXABAN 5 MG PO TABS: 5.0000 mg | ORAL_TABLET | Freq: Two times a day (BID) | ORAL | 3 refills | Status: AC

## 2024-05-11 MED ORDER — EMPAGLIFLOZIN 10 MG PO TABS: 10.0000 mg | ORAL_TABLET | Freq: Every day | ORAL | 3 refills | Status: AC

## 2024-05-11 MED ORDER — SACUBITRIL-VALSARTAN 24-26 MG PO TABS: 1.0000 | ORAL_TABLET | Freq: Two times a day (BID) | ORAL | 3 refills | Status: AC

## 2024-05-11 NOTE — Patient Instructions (Signed)
 Medication Instructions:  Your physician recommends that you continue on your current medications as directed. Please refer to the Current Medication list given to you today.  *If you need a refill on your cardiac medications before your next appointment, please call your pharmacy*  Lab Work: NONE   If you have labs (blood work) drawn today and your tests are completely normal, you will receive your results only by: MyChart Message (if you have MyChart) OR A paper copy in the mail If you have any lab test that is abnormal or we need to change your treatment, we will call you to review the results.  Testing/Procedures: NONE   Follow-Up: At [facility], you and your health needs are our priority.  As part of our continuing mission to provide you with exceptional heart care, our providers are all part of one team.  This team includes your primary Cardiologist (physician) and Advanced Practice Providers or APPs (Physician Assistants and Nurse Practitioners) who all work together to provide you with the care you need, when you need it.  Your next appointment:   1 year(s)  Provider:   You may see Alvan Carrier, MD or one of the following Advanced Practice Providers on your designated Care Team:   Laymon Qua, PA-C  [place], [place] Olivia Pavy, [place]     We recommend signing up for the patient portal called MyChart.  Sign up information is provided on this After Visit Summary.  MyChart is used to connect with patients for Virtual Visits (Telemedicine).  Patients are able to view lab/test results, encounter notes, upcoming appointments, etc.  Non-urgent messages can be sent to your provider as well.   To learn more about what you can do with MyChart, go to [email or web address].   Other Instructions Thank you for choosing [facility]!

## 2024-05-14 ENCOUNTER — Ambulatory Visit: Admitting: Physician Assistant

## 2024-08-02 ENCOUNTER — Ambulatory Visit (HOSPITAL_COMMUNITY): Admitting: Internal Medicine
